# Patient Record
Sex: Male | Born: 1970 | Race: Black or African American | Hispanic: No | Marital: Single | Smoking: Former smoker
Health system: Southern US, Community
[De-identification: ages and names within clinical notes are randomized; demographics above are authoritative.]

## PROBLEM LIST (undated history)

## (undated) DIAGNOSIS — F1011 Alcohol abuse, in remission: Secondary | ICD-10-CM

## (undated) DIAGNOSIS — Z95810 Presence of automatic (implantable) cardiac defibrillator: Secondary | ICD-10-CM

## (undated) DIAGNOSIS — F419 Anxiety disorder, unspecified: Secondary | ICD-10-CM

## (undated) DIAGNOSIS — I513 Intracardiac thrombosis, not elsewhere classified: Secondary | ICD-10-CM

## (undated) DIAGNOSIS — I509 Heart failure, unspecified: Secondary | ICD-10-CM

## (undated) DIAGNOSIS — I10 Essential (primary) hypertension: Secondary | ICD-10-CM

## (undated) DIAGNOSIS — I428 Other cardiomyopathies: Secondary | ICD-10-CM

## (undated) HISTORY — DX: Anxiety disorder, unspecified: F41.9

## (undated) HISTORY — DX: Other cardiomyopathies: I42.8

## (undated) HISTORY — DX: Intracardiac thrombosis, not elsewhere classified: I51.3

## (undated) HISTORY — PX: HERNIA REPAIR: SHX51

---

## 1996-04-01 HISTORY — PX: KNEE SURGERY: SHX244

## 2012-06-30 ENCOUNTER — Emergency Department (INDEPENDENT_AMBULATORY_CARE_PROVIDER_SITE_OTHER)
Admission: EM | Admit: 2012-06-30 | Discharge: 2012-06-30 | Disposition: A | Discharge status: Home / Self Care | Payer: Self-pay | Source: Home / Self Care

## 2012-06-30 ENCOUNTER — Encounter (HOSPITAL_COMMUNITY): Payer: Self-pay

## 2012-06-30 DIAGNOSIS — B86 Scabies: Secondary | ICD-10-CM

## 2012-06-30 MED ORDER — PERMETHRIN 5 % EX CREA: TOPICAL_CREAM | CUTANEOUS | Status: DC

## 2012-06-30 NOTE — ED Notes (Signed)
Reported generalized itching for 2 weeks, no one else in home affected; minimal relief w OTC treatmnet

## 2012-06-30 NOTE — ED Provider Notes (Signed)
History     [name]: [id number]  Arrival date & time 06/30/12  1443   None     No chief complaint on file.   (Consider location/radiation/quality/duration/timing/severity/associated sxs/prior treatment) HPI Comments: Itching that started on his hands, digits and wrists 2 weeks ago. Now spreading to torso.    No past medical history on file.  No past surgical history on file.  No family history on file.  History  Substance Use Topics  . Smoking status: Not on file  . Smokeless tobacco: Not on file  . Alcohol Use: Not on file      Review of Systems  Constitutional: Negative.   Respiratory: Negative.   Gastrointestinal: Negative.   Genitourinary: Negative.   Skin: Negative for color change and pallor.       Positive for punctate lesions of the hands come interdigital web spaces forearms and trunk. Scratch marks and burrowing are present.    Allergies  Review of patient's allergies indicates not on file.  Home Medications   Current Outpatient Rx  Name  Route  Sig  Dispense  Refill  . permethrin (ELIMITE) 5 % cream      Apply chin to toes, rinse off in 8 to 12 hours   60 g   1     There were no vitals taken for this visit.  Physical Exam  Nursing note and vitals reviewed. Constitutional: He is oriented to person, place, and time. He appears well-developed and well-nourished. No distress.  Eyes: Conjunctivae and EOM are normal.  Neck: Normal range of motion. Neck supple.  Pulmonary/Chest: Effort normal.  Neurological: He is alert and oriented to person, place, and time.  Skin: Skin is warm and dry. Rash noted.  Psychiatric: He has a normal mood and affect.    ED Course  Procedures (including critical care time)  Labs Reviewed - No data to display No results found.   1. Scabies infestation       MDM  Apply to body as directed Clean linen and other clothes, etc as discussed.  Hayden Rasmussen, NP 06/30/12 1618  Hayden Rasmussen, NP 06/30/12 2100

## 2012-07-02 NOTE — ED Provider Notes (Signed)
Medical screening examination/treatment/procedure(s) were performed by resident physician or non-physician practitioner and as supervising physician I was immediately available for consultation/collaboration.   Yahya Boldman DOUGLAS MD.   Sky Borboa D Laurine Kuyper, MD 07/02/12 1944 

## 2012-07-14 ENCOUNTER — Encounter (HOSPITAL_COMMUNITY): Payer: Self-pay | Admitting: *Deleted

## 2012-07-14 ENCOUNTER — Emergency Department (INDEPENDENT_AMBULATORY_CARE_PROVIDER_SITE_OTHER)
Admission: EM | Admit: 2012-07-14 | Discharge: 2012-07-14 | Disposition: A | Discharge status: Home / Self Care | Payer: Self-pay | Source: Home / Self Care | Attending: Family Medicine | Admitting: Family Medicine

## 2012-07-14 DIAGNOSIS — I10 Essential (primary) hypertension: Secondary | ICD-10-CM

## 2012-07-14 MED ORDER — HYDROCHLOROTHIAZIDE 25 MG PO TABS: 25.0000 mg | ORAL_TABLET | Freq: Every day | ORAL | Status: DC

## 2012-07-14 MED ORDER — LISINOPRIL 20 MG PO TABS: 20.0000 mg | ORAL_TABLET | Freq: Every day | ORAL | Status: DC

## 2012-07-14 NOTE — ED Notes (Signed)
Headache left parietal area - feels pain behind eye - intermittent H/A x 4 days - Also C/O dizzyness when turns quickly or stands up too fast.

## 2012-07-14 NOTE — ED Provider Notes (Signed)
History     [name]: [id number]  Arrival date & time 07/14/12  1517   First MD Initiated Contact with Patient 07/14/12 1533      Chief Complaint  Patient presents with  . Headache    (Consider location/radiation/quality/duration/timing/severity/associated sxs/prior treatment) Patient is a 42 y.o. male presenting with headaches. The history is provided by the patient.  Headache Pain location:  Generalized Quality:  Dull Onset quality:  Gradual Timing:  Intermittent Progression:  Waxing and waning Chronicity:  New Context comment:  Has not taken bp meds for 6 mos., ran out. Associated symptoms: no fever, no loss of balance, no numbness, no paresthesias and no photophobia     Past Medical History  Diagnosis Date  . Hypertension     Past Surgical History  Procedure Laterality Date  . Hernia repair    . Knee surgery Right 1998    Family History  Problem Relation Age of Onset  . Hypertension Mother   . Hypertension Father   . Hypertension Sister     History  Substance Use Topics  . Smoking status: Former Games developer  . Smokeless tobacco: Former Neurosurgeon  . Alcohol Use: No      Review of Systems  Constitutional: Negative.  Negative for fever.  Eyes: Negative for photophobia.  Cardiovascular: Negative.  Negative for chest pain and leg swelling.  Gastrointestinal: Negative.   Neurological: Positive for headaches. Negative for numbness, paresthesias and loss of balance.    Allergies  Review of patient's allergies indicates not on file.  Home Medications   Current Outpatient Rx  Name  Route  Sig  Dispense  Refill  . hydrochlorothiazide (HYDRODIURIL) 25 MG tablet   Oral   Take 25 mg by mouth daily.         . hydrochlorothiazide (HYDRODIURIL) 25 MG tablet   Oral   Take 1 tablet (25 mg total) by mouth daily.   30 tablet   1   . lisinopril (PRINIVIL,ZESTRIL) 10 MG tablet   Oral   Take 10 mg by mouth daily.         Marland Kitchen lisinopril (PRINIVIL,ZESTRIL) 20 MG  tablet   Oral   Take 1 tablet (20 mg total) by mouth daily.   30 tablet   1   . permethrin (ELIMITE) 5 % cream      Apply chin to toes, rinse off in 8 to 12 hours   60 g   1   . permethrin (ELIMITE) 5 % cream      Apply from chin to toes, then rinse in 8-10 hours   60 g   1     BP 192/135  Pulse 86  Temp(Src) 98.2 F (36.8 C) (Oral)  SpO2 95%  Physical Exam  Nursing note and vitals reviewed. Constitutional: He is oriented to person, place, and time. He appears well-developed and well-nourished.  HENT:  Head: Normocephalic.  Right Ear: External ear normal.  Left Ear: External ear normal.  Eyes: Conjunctivae are normal. Pupils are equal, round, and reactive to light.  Neck: Normal range of motion and phonation normal. Neck supple. Normal carotid pulses present. Carotid bruit is not present.  Cardiovascular: Normal rate, regular rhythm, normal heart sounds and intact distal pulses.   Pulmonary/Chest: Effort normal and breath sounds normal.  Musculoskeletal: He exhibits no edema.  Lymphadenopathy:    He has no cervical adenopathy.  Neurological: He is alert and oriented to person, place, and time.  Skin: Skin is warm and dry.  Psychiatric:  He has a normal mood and affect. His behavior is normal. Judgment and thought content normal.    ED Course  Procedures (including critical care time)  Labs Reviewed - No data to display No results found.   1. Hypertension, essential       MDM         Linna Hoff, MD 07/14/12 1620

## 2012-07-20 ENCOUNTER — Encounter (HOSPITAL_COMMUNITY): Payer: Self-pay | Admitting: *Deleted

## 2012-07-20 ENCOUNTER — Emergency Department (INDEPENDENT_AMBULATORY_CARE_PROVIDER_SITE_OTHER)
Admission: EM | Admit: 2012-07-20 | Discharge: 2012-07-20 | Disposition: A | Discharge status: Home / Self Care | Payer: Self-pay | Source: Home / Self Care

## 2012-07-20 DIAGNOSIS — I10 Essential (primary) hypertension: Secondary | ICD-10-CM

## 2012-07-20 NOTE — ED Provider Notes (Signed)
History     [name]: [id number]  Arrival date & time 07/20/12  1233   First MD Initiated Contact with Patient 07/20/12 1319      Chief Complaint  Patient presents with  . Follow-up     HPI 42 year old male with uncontrolled hypertension for almost 10 years who had not followed up for several months and not been on any medications he was seen one week back in the clinic and noted to have uncontrolled blood pressure. He was restarted on HCTZ-lisinopril (25-20 mg) once a day and ask for a followup visit in one week. Patient has symptom of headache for several week during his visit last week. Today he informs that he has started taking his blood pressure medication and has been quite compliant with it. His headache has resolved. Denies any blurry  vision, dizziness, chest pain, palpitations, shortness of breath, abdominal pain, nausea, vomiting, bowel or urinary symptoms. Denies any tingling or numbness of extremities. He informs that he has started to avoid junk foods and avoided drinking sodas. He is also started cutting down on fatty foods and trying to loose some weight.  Past Medical History  Diagnosis Date  . Hypertension     Past Surgical History  Procedure Laterality Date  . Hernia repair    . Knee surgery Right 1998    Family History  Problem Relation Age of Onset  . Hypertension Mother   . Hypertension Father   . Hypertension Sister     History  Substance Use Topics  . Smoking status: Former Games developer  . Smokeless tobacco: Former Neurosurgeon  . Alcohol Use: No      Review of Systems As outlined in history of present illness Allergies  Review of patient's allergies indicates no known allergies.  Home Medications   Current Outpatient Rx  Name  Route  Sig  Dispense  Refill  . hydrochlorothiazide (HYDRODIURIL) 25 MG tablet   Oral   Take 25 mg by mouth daily.         . hydrochlorothiazide (HYDRODIURIL) 25 MG tablet   Oral   Take 1 tablet (25 mg total) by mouth  daily.   30 tablet   1   . lisinopril (PRINIVIL,ZESTRIL) 10 MG tablet   Oral   Take 10 mg by mouth daily.         Marland Kitchen lisinopril (PRINIVIL,ZESTRIL) 20 MG tablet   Oral   Take 1 tablet (20 mg total) by mouth daily.   30 tablet   1   . permethrin (ELIMITE) 5 % cream      Apply chin to toes, rinse off in 8 to 12 hours   60 g   1   . permethrin (ELIMITE) 5 % cream      Apply from chin to toes, then rinse in 8-10 hours   60 g   1     BP 134/103  Pulse 85  Temp(Src) 98.2 F (36.8 C) (Oral)  Resp 16  SpO2 99%  Physical Exam Middle aged male in no acute distress HEENT: No pallor, muscle to mucosa Chest: Clear to auscultation bilaterally, no added sounds CVS: Normal S1 and S2, no murmurs Abdomen: Soft, nontender, nondistended, bowel sounds present Extremities: Warm, no edema CNS: AAOX3  ED Course  Procedures (including critical care time)    No diagnosis found.  Assessment/ plan  Uncontrolled hypertension Blood pressure has improved since his last visit however still has elevated diastolic blood pressure. I would continue him on his current blood  pressure medication as it was  recently started. I have encouraged him to continue taking minimal salt in diet, avoiding caffineated beverages and diet with high salt content. I will order for labs to check for CBC, BMET, lipid panel, A1C and UA.  Followup in the clinic in one week to have his labs and blood pressure checked. Advised to have his blood pressure checked every 2-3 days until his next visit and keep a log.       MDM  Follow up in one week to have his labs reviewed and blood pressure checked to adjust his medications        Keilah Lemire, MD 07/20/12 1414

## 2012-07-20 NOTE — ED Notes (Signed)
Patient presents for follow up of hypertension. Patient states he feels better and headaches have gone away.

## 2012-07-21 ENCOUNTER — Emergency Department (INDEPENDENT_AMBULATORY_CARE_PROVIDER_SITE_OTHER)
Admission: EM | Admit: 2012-07-21 | Discharge: 2012-07-21 | Disposition: A | Discharge status: Home / Self Care | Payer: Self-pay | Source: Home / Self Care

## 2012-07-21 DIAGNOSIS — I10 Essential (primary) hypertension: Secondary | ICD-10-CM

## 2012-07-21 LAB — CBC
HCT: 41.9 % (ref 39.0–52.0)
Hemoglobin: 15.2 g/dL (ref 13.0–17.0)
MCH: 29.7 pg (ref 26.0–34.0)
MCV: 81.8 fL (ref 78.0–100.0)
RBC: 5.12 MIL/uL (ref 4.22–5.81)

## 2012-07-21 LAB — LIPID PANEL
LDL Cholesterol: 172 mg/dL — ABNORMAL HIGH (ref 0–99)
Triglycerides: 163 mg/dL — ABNORMAL HIGH (ref <150)
VLDL: 33 mg/dL (ref 0–40)

## 2012-07-21 LAB — BASIC METABOLIC PANEL
BUN: 10 mg/dL (ref 6–23)
CO2: 31 mEq/L (ref 19–32)
Calcium: 10 mg/dL (ref 8.4–10.5)
Creatinine, Ser: 0.95 mg/dL (ref 0.50–1.35)
Glucose, Bld: 88 mg/dL (ref 70–99)
Sodium: 140 mEq/L (ref 135–145)

## 2012-07-21 LAB — URINALYSIS, ROUTINE W REFLEX MICROSCOPIC
Glucose, UA: NEGATIVE mg/dL
Ketones, ur: NEGATIVE mg/dL
Leukocytes, UA: NEGATIVE
Protein, ur: NEGATIVE mg/dL

## 2012-07-21 NOTE — ED Notes (Signed)
Patient here for lab work Cbc bmet Lipid a1c ua

## 2012-07-21 NOTE — ED Notes (Signed)
Here for lab work Cbc bmet Lipid a1c ua

## 2012-07-28 ENCOUNTER — Encounter (HOSPITAL_COMMUNITY): Payer: Self-pay

## 2012-07-28 ENCOUNTER — Emergency Department (HOSPITAL_COMMUNITY)
Admission: EM | Admit: 2012-07-28 | Discharge: 2012-07-28 | Disposition: A | Discharge status: Home / Self Care | Payer: Self-pay | Source: Home / Self Care

## 2012-07-28 DIAGNOSIS — I10 Essential (primary) hypertension: Secondary | ICD-10-CM

## 2012-07-28 DIAGNOSIS — E785 Hyperlipidemia, unspecified: Secondary | ICD-10-CM

## 2012-07-28 MED ORDER — ATORVASTATIN CALCIUM 20 MG PO TABS: 20.0000 mg | ORAL_TABLET | Freq: Every day | ORAL | Status: DC

## 2012-07-28 MED ORDER — ASPIRIN 81 MG PO TBEC: 81.0000 mg | DELAYED_RELEASE_TABLET | Freq: Every day | ORAL | Status: DC

## 2012-07-28 NOTE — ED Notes (Signed)
Patient here for follow up History of HTN Needs lab results

## 2012-07-28 NOTE — ED Provider Notes (Signed)
History     [name]: [id number]  Arrival date & time 07/28/12  1057   None     Chief Complaint  Patient presents with  . Hypertension     42 year old male with a history of hypertension comes for a followup appointment for blood pressure check. The patient has been taking HCTZ/lisinopril. His blood pressure is 148 systolic today. The patient had blood work done during his last visit and his elevated LDL and low HDL. I have recommended him to be started on Lipitor., Also advised him to monitor his blood pressure on a regular basis.Denies any blurry vision, dizziness, chest pain, palpitations, shortness of breath, abdominal pain, nausea, vomiting, bowel or urinary symptoms. Denies any tingling or numbness of extremities.  HPI  Past Medical History  Diagnosis Date  . Hypertension     Past Surgical History  Procedure Laterality Date  . Hernia repair    . Knee surgery Right 1998    Family History  Problem Relation Age of Onset  . Hypertension Mother   . Hypertension Father   . Hypertension Sister     History  Substance Use Topics  . Smoking status: Former Games developer  . Smokeless tobacco: Former Neurosurgeon  . Alcohol Use: No      Review of Systems  Allergies  Review of patient's allergies indicates no known allergies.  Home Medications   Current Outpatient Rx  Name  Route  Sig  Dispense  Refill  . aspirin 81 MG EC tablet   Oral   Take 1 tablet (81 mg total) by mouth daily. Swallow whole.   30 tablet   12   . atorvastatin (LIPITOR) 20 MG tablet   Oral   Take 1 tablet (20 mg total) by mouth daily.   30 tablet   2   . hydrochlorothiazide (HYDRODIURIL) 25 MG tablet   Oral   Take 1 tablet (25 mg total) by mouth daily.   30 tablet   1   . lisinopril (PRINIVIL,ZESTRIL) 20 MG tablet   Oral   Take 1 tablet (20 mg total) by mouth daily.   30 tablet   1   . permethrin (ELIMITE) 5 % cream      Apply chin to toes, rinse off in 8 to 12 hours   60 g   1   . permethrin  (ELIMITE) 5 % cream      Apply from chin to toes, then rinse in 8-10 hours   60 g   1     BP 148/94  Pulse 87  Temp(Src) 97.7 F (36.5 C) (Oral)  SpO2 100%  Physical Exam Middle aged male in no acute distress  HEENT: No pallor, muscle to mucosa  Chest: Clear to auscultation bilaterally, no added sounds  CVS: Normal S1 and S2, no murmurs  Abdomen: Soft, nontender, nondistended, bowel sounds present  Extremities: Warm, no edema  CNS: AAOX3   ED Course  Procedures (including critical care time)  Labs Reviewed - No data to display No results found.   1. Hypertension continue antihypertensive medication and followup in a month for a recheck   2. Dyslipidemia (high LDL; low HDL) started on Lipitor. Also started the patient on a baby aspirin     Followup in one month  MDM           Richarda Overlie, MD 07/28/12 1128

## 2012-08-25 ENCOUNTER — Telehealth: Payer: Self-pay | Admitting: General Practice

## 2012-08-25 NOTE — Telephone Encounter (Signed)
08/25/12 Patient unavailable at this time message  Left with motherHattie Perch) to have patient  Call and schedule appointment  To be seen this week for f/u hypertension.  P.Mekia Dipinto,RN

## 2012-08-25 NOTE — Telephone Encounter (Signed)
Pt saw Dr[facility] Artis Flock [facility] on 07/14/12 and was prescribed 1)Hydrocholorthiazide and 2)Lisinopril.  Even though chart says he has a remaining refill his pharmacy told him he does not.  Pt would like script to be resent for final refill, he is an established pt with [facility].

## 2012-08-25 NOTE — Telephone Encounter (Signed)
Pt was directed at his visit 4/15 to f/u in 1 month regarding his HTN. Please schedule him a visit as soon as possible so we can se him and address his concerns. Thanks AW

## 2012-09-02 ENCOUNTER — Ambulatory Visit: Payer: Self-pay | Attending: Family Medicine | Admitting: Internal Medicine

## 2012-09-02 ENCOUNTER — Encounter: Payer: Self-pay | Admitting: Internal Medicine

## 2012-09-02 VITALS — BP 125/84 | HR 87 | Temp 98.3°F | Resp 16 | Wt 208.6 lb

## 2012-09-02 DIAGNOSIS — I10 Essential (primary) hypertension: Secondary | ICD-10-CM | POA: Insufficient documentation

## 2012-09-02 MED ORDER — LISINOPRIL-HYDROCHLOROTHIAZIDE 20-25 MG PO TABS: 1.0000 | ORAL_TABLET | Freq: Every day | ORAL | Status: DC

## 2012-09-02 NOTE — Patient Instructions (Signed)

## 2012-09-02 NOTE — Progress Notes (Signed)
Patient ID: Robert Booth, male   DOB: 1971-03-26, 42 y.o.   MRN: [id number]  CC: refill of BP medication   HPI: Pt is 42 yo male who comes in needing refill on BP medication. He denies chest pain or shortness of breath, no specific concerns. He reports compliance with medications.  No Known Allergies Past Medical History  Diagnosis Date  . Hypertension    Current Outpatient Prescriptions on File Prior to Visit  Medication Sig Dispense Refill  . aspirin 81 MG EC tablet Take 1 tablet (81 mg total) by mouth daily. Swallow whole.  30 tablet  12  . atorvastatin (LIPITOR) 20 MG tablet Take 1 tablet (20 mg total) by mouth daily.  30 tablet  2  . hydrochlorothiazide (HYDRODIURIL) 25 MG tablet Take 1 tablet (25 mg total) by mouth daily.  30 tablet  1  . lisinopril (PRINIVIL,ZESTRIL) 20 MG tablet Take 1 tablet (20 mg total) by mouth daily.  30 tablet  1  . permethrin (ELIMITE) 5 % cream Apply chin to toes, rinse off in 8 to 12 hours  60 g  1  . permethrin (ELIMITE) 5 % cream Apply from chin to toes, then rinse in 8-10 hours  60 g  1   No current facility-administered medications on file prior to visit.   Family History  Problem Relation Age of Onset  . Hypertension Mother   . Hypertension Father   . Hypertension Sister    History   Social History  . Marital Status: Single    Spouse Name: N/A    Number of Children: N/A  . Years of Education: N/A   Occupational History  . Not on file.   Social History Main Topics  . Smoking status: Former Games developer  . Smokeless tobacco: Former Neurosurgeon  . Alcohol Use: No  . Drug Use: Yes  . Sexually Active: Yes   Other Topics Concern  . Not on file   Social History Narrative  . No narrative on file    Review of Systems  Constitutional: Negative for fever, chills, diaphoresis, activity change, appetite change and fatigue.  HENT: Negative for ear pain, nosebleeds, congestion, facial swelling, rhinorrhea, neck pain, neck stiffness and ear discharge.    Eyes: Negative for pain, discharge, redness, itching and visual disturbance.  Respiratory: Negative for cough, choking, chest tightness, shortness of breath, wheezing and stridor.   Cardiovascular: Negative for chest pain, palpitations and leg swelling.  Gastrointestinal: Negative for abdominal distention.  Genitourinary: Negative for dysuria, urgency, frequency, hematuria, flank pain, decreased urine volume, difficulty urinating and dyspareunia.  Musculoskeletal: Negative for back pain, joint swelling, arthralgias and gait problem.  Neurological: Negative for dizziness, tremors, seizures, syncope, facial asymmetry, speech difficulty, weakness, light-headedness, numbness and headaches.  Hematological: Negative for adenopathy. Does not bruise/bleed easily.  Psychiatric/Behavioral: Negative for hallucinations, behavioral problems, confusion, dysphoric mood, decreased concentration and agitation.    Objective:   Filed Vitals:   09/02/12 0936  BP: 125/84  Pulse: 87  Temp: 98.3 F (36.8 C)  Resp: 16    Physical Exam  Constitutional: Appears well-developed and well-nourished. No distress.  HENT: Normocephalic. External right and left ear normal. Oropharynx is clear and moist.  Eyes: Conjunctivae and EOM are normal. PERRLA, no scleral icterus.  Neck: Normal ROM. Neck supple. No JVD. No tracheal deviation. No thyromegaly.  CVS: RRR, S1/S2 +, no murmurs, no gallops, no carotid bruit.  Pulmonary: Effort and breath sounds normal, no stridor, rhonchi, wheezes, rales.  Abdominal: Soft. BS +,  no distension, tenderness, rebound or guarding.  Musculoskeletal: Normal range of motion. No edema and no tenderness.  Lymphadenopathy: No lymphadenopathy noted, cervical, inguinal. Neuro: Alert. Normal reflexes, muscle tone coordination. No cranial nerve deficit. Skin: Skin is warm and dry. No rash noted. Not diaphoretic. No erythema. No pallor.  Psychiatric: Normal mood and affect. Behavior, judgment,  thought content normal.   Lab Results  Component Value Date   WBC 6.7 07/21/2012   HGB 15.2 07/21/2012   HCT 41.9 07/21/2012   MCV 81.8 07/21/2012   PLT 284 07/21/2012   Lab Results  Component Value Date   CREATININE 0.95 07/21/2012   BUN 10 07/21/2012   NA 140 07/21/2012   K 3.7 07/21/2012   CL 98 07/21/2012   CO2 31 07/21/2012    Lab Results  Component Value Date   HGBA1C 4.9 07/21/2012   Lipid Panel     Component Value Date/Time   CHOL 262* 07/21/2012 1026   TRIG 163* 07/21/2012 1026   HDL 57 07/21/2012 1026   CHOLHDL 4.6 07/21/2012 1026   VLDL 33 07/21/2012 1026   LDLCALC 172* 07/21/2012 1026       Assessment and plan:   HTN - well controlled, we have discussed importance of checking BP regularly and to call us back if BP  Is > 140/90 - recommended check up in 2-3 months, BMP and BP check

## 2012-09-02 NOTE — Progress Notes (Signed)
Patient here for medication refill Takes medication for HTN

## 2014-05-31 HISTORY — PX: CARDIAC CATHETERIZATION: SHX172

## 2014-07-01 DIAGNOSIS — I513 Intracardiac thrombosis, not elsewhere classified: Secondary | ICD-10-CM

## 2014-07-01 HISTORY — DX: Intracardiac thrombosis, not elsewhere classified: I51.3

## 2014-08-11 ENCOUNTER — Telehealth: Payer: Self-pay | Admitting: Cardiology

## 2014-08-11 ENCOUNTER — Encounter (HOSPITAL_COMMUNITY): Payer: Self-pay | Admitting: Emergency Medicine

## 2014-08-11 ENCOUNTER — Emergency Department (INDEPENDENT_AMBULATORY_CARE_PROVIDER_SITE_OTHER)
Admission: EM | Admit: 2014-08-11 | Discharge: 2014-08-11 | Disposition: A | Discharge status: Home / Self Care | Payer: Self-pay | Source: Home / Self Care | Attending: Family Medicine | Admitting: Family Medicine

## 2014-08-11 DIAGNOSIS — I213 ST elevation (STEMI) myocardial infarction of unspecified site: Secondary | ICD-10-CM

## 2014-08-11 DIAGNOSIS — I509 Heart failure, unspecified: Secondary | ICD-10-CM

## 2014-08-11 DIAGNOSIS — I513 Intracardiac thrombosis, not elsewhere classified: Secondary | ICD-10-CM

## 2014-08-11 DIAGNOSIS — I158 Other secondary hypertension: Secondary | ICD-10-CM

## 2014-08-11 DIAGNOSIS — F1020 Alcohol dependence, uncomplicated: Secondary | ICD-10-CM

## 2014-08-11 LAB — PROTIME-INR
INR: 3.11 — ABNORMAL HIGH (ref 0.00–1.49)
Prothrombin Time: 32.2 seconds — ABNORMAL HIGH (ref 11.6–15.2)

## 2014-08-11 NOTE — Progress Notes (Signed)
08/11/2014: INR result = 3.1. Patient contacted by phone and notified of results. Advised to continue current dosing schedule and to follow up with Dr. Antoine Poche at upcoming appointment on 08/18/2014 @ 8:15am

## 2014-08-11 NOTE — Telephone Encounter (Signed)
Received records from [facility] for appointment with Dr Antoine Poche on 08/18/14.  Records given to [facility] (medical records) for Dr Hochrein's schedule on 08/18/14. lp

## 2014-08-11 NOTE — ED Notes (Signed)
Medication refill and blood draw

## 2014-08-11 NOTE — ED Provider Notes (Signed)
[name]: [id number]     Arrival date & time 08/11/14  1100 History   First MD Initiated Contact with Patient 08/11/14 1257     Chief Complaint  Patient presents with  . Medication Refill   (Consider location/radiation/quality/duration/timing/severity/associated sxs/prior Treatment) HPI Comments: Patient presents requesting INR check and medication refills. States he moved back to [place], [place] from [place], [place] on Aug 06, 2014. Was under the care of [facility] for management of CHF (EF 20%), LV Thrombus, HTN, alcoholic cardiomyopathy. Was last seen in [place] on Aug 02, 2014 and INR at that time was 2.8 (goal 2-3). States he recently completed inpatient program for alcoholism and states he has been sober since May 25, 2014. Came to [place] after leaving program to live with family. No arrangements made for PCP or cardiology follow up before relocating. Reports himself to currently be asymptomatic.  The history is provided by the patient.    Past Medical History  Diagnosis Date  . Hypertension    Past Surgical History  Procedure Laterality Date  . Hernia repair    . Knee surgery Right 1998   Family History  Problem Relation Age of Onset  . Hypertension Mother   . Hypertension Father   . Hypertension Sister    History  Substance Use Topics  . Smoking status: Former Research scientist (life sciences)  . Smokeless tobacco: Former Systems developer  . Alcohol Use: No    Review of Systems  All other systems reviewed and are negative.   Allergies  Review of patient's allergies indicates no known allergies.  Home Medications   Prior to Admission medications   Medication Sig Start Date End Date Taking? Authorizing Provider  furosemide (LASIX) 40 MG tablet Take 40 mg by mouth.   Yes Historical Provider, MD  losartan (COZAAR) 50 MG tablet Take 50 mg by mouth daily.   Yes Historical Provider, MD  metoprolol tartrate (LOPRESSOR) 25 MG tablet Take 25 mg by mouth 2 (two) times daily.   Yes Historical Provider, MD   warfarin (COUMADIN) 10 MG tablet Take 10 mg by mouth daily.   Yes Historical Provider, MD  aspirin 81 MG EC tablet Take 1 tablet (81 mg total) by mouth daily. Swallow whole. 07/28/12   Reyne Dumas, MD  atorvastatin (LIPITOR) 20 MG tablet Take 1 tablet (20 mg total) by mouth daily. Patient not taking: Reported on 08/11/2014 07/28/12   Reyne Dumas, MD  lisinopril-hydrochlorothiazide (PRINZIDE,ZESTORETIC) 20-25 MG per tablet Take 1 tablet by mouth daily. 09/02/12   Theodis Blaze, MD  permethrin (ELIMITE) 5 % cream Apply chin to toes, rinse off in 8 to 12 hours Patient not taking: Reported on 08/11/2014 06/30/12   Janne Napoleon, NP  permethrin (ELIMITE) 5 % cream Apply from chin to toes, then rinse in 8-10 hours Patient not taking: Reported on 08/11/2014 06/30/12   Janne Napoleon, NP   BP 129/94 mmHg  Pulse 72  Temp(Src) 98.3 F (36.8 C) (Oral)  Resp 18  SpO2 100% Physical Exam  Constitutional: He is oriented to person, place, and time. He appears well-developed and well-nourished. No distress.  HENT:  Head: Normocephalic and atraumatic.  Eyes: Conjunctivae are normal. No scleral icterus.  Cardiovascular: Normal rate, regular rhythm and normal heart sounds.   Pulmonary/Chest: Effort normal and breath sounds normal. No respiratory distress. He has no wheezes. He has no rales.  Musculoskeletal: Normal range of motion.  Neurological: He is alert and oriented to person, place, and time.  Skin: Skin is warm and dry.  Psychiatric: He has a normal mood and affect. His behavior is normal.  Nursing note and vitals reviewed.   ED Course  Procedures (including critical care time) Labs Review Labs Reviewed  PROTIME-INR    Imaging Review No results found.   MDM   1. Heart failure, unspecified heart failure chronicity, unspecified heart failure type   2. Other secondary hypertension   3. Left ventricular thrombus   4. Alcoholism    I have contacted a local cardiologist (Dr. Jeneen Rinks Hochrein's office)  who will see patient on Aug 18, 2014 at 8:00am. After reviewing paperwork, it would appear that patient has active refills on at [facility] for current medications. Patient advised to choose a local pharmacy and have them contact  previous pharmacy to transfer active prescriptions. Advised to please take care of this today. INR drawn at [facility] and patient will be contacted by phone at [phone] with the results of INR if  coumadin dose requires adjustment. Patient also met with patient access coordinator while in clinic to discuss financial assistance program.     Lutricia Feil, [place] 08/11/14 1431

## 2014-08-11 NOTE — Discharge Instructions (Signed)
I have contacted a local cardiologist who will see you on Aug 18, 2014 at 8:00am. After reviewing your paperwork, it would appear that you have active refills on your current medications. Please choose a local pharmacy and have them contact your previous pharmacy to transfer your active prescriptions. Please take care of this today. You will be contacted by phone at [phone] with the results of your INR if your coumadin requires adjustment.

## 2014-08-18 ENCOUNTER — Ambulatory Visit (INDEPENDENT_AMBULATORY_CARE_PROVIDER_SITE_OTHER): Payer: Self-pay | Admitting: Cardiology

## 2014-08-18 ENCOUNTER — Other Ambulatory Visit: Payer: Self-pay | Admitting: *Deleted

## 2014-08-18 ENCOUNTER — Encounter: Payer: Self-pay | Admitting: Cardiology

## 2014-08-18 VITALS — BP 140/80 | HR 94 | Ht 69.0 in | Wt 195.0 lb

## 2014-08-18 DIAGNOSIS — I429 Cardiomyopathy, unspecified: Secondary | ICD-10-CM

## 2014-08-18 DIAGNOSIS — R002 Palpitations: Secondary | ICD-10-CM

## 2014-08-18 DIAGNOSIS — I428 Other cardiomyopathies: Secondary | ICD-10-CM | POA: Insufficient documentation

## 2014-08-18 DIAGNOSIS — I10 Essential (primary) hypertension: Secondary | ICD-10-CM

## 2014-08-18 MED ORDER — METOPROLOL SUCCINATE ER 50 MG PO TB24: 50.0000 mg | ORAL_TABLET | Freq: Every day | ORAL | Status: DC

## 2014-08-18 NOTE — Progress Notes (Addendum)
Cardiology Office Note   Date:  08/18/2014   ID:  Robert Booth, DOB 06-Mar-1971, MRN [id number]  PCP:  Standley Dakins, MD  Cardiologist:   Rollene Rotunda, MD   Chief Complaint  Patient presents with  . Cardiomyopathy      History of Present Illness: Robert Booth is a 44 y.o. male who presents for evaluation of cardiomyopathy. 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 This was thought to be probably alcohol related. Although he does have difficult to control hypertension. He was treated with beta blockers. He did undergo cardiac catheterization. I don't have the cath results but he says it was normal. He came back for another visit last month and was found to have LV thrombus. His ejection fraction was about 20%. I was able to review outside records. He was treated with warfarin. He is now back living here. He has been alcohol free since February. He denies any cardiovascular symptoms. He walks for exercise. The patient denies any new symptoms such as chest discomfort, neck or arm discomfort. There has been no new shortness of breath, PND or orthopnea. There have been no reported palpitations, presyncope or syncope.   Past Medical History  Diagnosis Date  . Hypertension   . Alcoholism     Sober since Feb  . Anxiety     Past Surgical History  Procedure Laterality Date  . Hernia repair Right Inguinal  . Knee surgery Right 1998     Current Outpatient Prescriptions  Medication Sig Dispense Refill  . aspirin 81 MG EC tablet Take 1 tablet (81 mg total) by mouth daily. Swallow whole. 30 tablet 12  . furosemide (LASIX) 40 MG tablet Take 40 mg by mouth.    . losartan (COZAAR) 50 MG tablet Take 50 mg by mouth daily.    . metoprolol succinate (TOPROL-XL) 25 MG 24 hr tablet Take 25 mg by mouth daily.    Marland Kitchen warfarin (COUMADIN) 10 MG tablet Take 10 mg by mouth daily.     No current facility-administered medications for  this visit.    Allergies:   Review of patient's allergies indicates no known allergies.    Social History:  The patient  reports that he has quit smoking. His smoking use included Cigarettes. He has quit using smokeless tobacco. His smokeless tobacco use included Chew. He reports that he uses illicit drugs. He reports that he does not drink alcohol.   Family History:  The patient's family history includes Heart attack (age of onset: 18) in his paternal grandfather; Heart attack (age of onset: 83) in his father; Hypertension in his father, mother, and sister.    ROS:  Please see the history of present illness.   Otherwise, review of systems are positive for none.   All other systems are reviewed and negative.    PHYSICAL EXAM: VS:  BP 140/80 mmHg  Pulse 94  Ht  (1.753 m)  Wt 195 lb (88.451 kg)  BMI 28.78 kg/m2 , BMI Body mass index is 28.78 kg/(m^2). GENERAL:  Well appearing HEENT:  Pupils equal round and reactive, fundi not visualized, oral mucosa unremarkable NECK:  No jugular venous distention, waveform within normal limits, carotid upstroke brisk and symmetric, no bruits, no thyromegaly LYMPHATICS:  No cervical, inguinal adenopathy LUNGS:  Clear to auscultation bilaterally BACK:  No CVA tenderness CHEST:  Unremarkable HEART:  PMI not displaced or sustained,S1 and S2 within normal limits, positive S3,  no S4, no clicks, no rubs, no murmurs ABD:  Flat, positive bowel sounds normal in frequency in pitch, no bruits, no rebound, no guarding, no midline pulsatile mass, no hepatomegaly, no splenomegaly EXT:  2 plus pulses throughout, no edema, no cyanosis no clubbing SKIN:  No rashes no nodules NEURO:  Cranial nerves II through XII grossly intact, motor grossly intact throughout PSYCH:  Cognitively intact, oriented to person place and time    EKG:  EKG is ordered today. The ekg ordered today demonstrates sinus rhythm, rate 94, axis within normal limits, left ventricular  hypertrophy by voltage criteria no acute ST-T wave changes    Wt Readings from Last 3 Encounters:  08/18/14 195 lb (88.451 kg)  09/02/12 208 lb 9.6 oz (94.62 kg)      Other studies Reviewed: Additional studies/ records that were reviewed today include: Outside records from [facility]. Review of the above records demonstrates:  Please see elsewhere in the note.     ASSESSMENT AND PLAN:  CARDIOMYOPATHY:  Dilated probably related to alcohol. Then going to titrate his medications. We talked about salt and fluid restriction. He is alcohol free. I'm going to double his beta blocker today. I suspect we we'll be able to double this when we see him again. The ultimate goal would be at least 150 mg with Cozaar dose being 100 mg. I would then add spironolactone after this.  HTN:  This is being managed in the context of treating his CHF   Current medicines are reviewed at length with the patient today.  The patient does not have concerns regarding medicines.  The following changes have been made:  See below  Labs/ tests ordered today include:   Orders Placed This Encounter  Procedures  . EKG 12-Lead     Disposition:   FU with APP in two weeks for med titratin.     Signed, Rollene Rotunda, MD  08/18/2014 8:36 AM    [facility]

## 2014-08-18 NOTE — Patient Instructions (Signed)
Your physician recommends that you schedule a follow-up appointment in: 2 weeks with a PA  We have increased your Toprol to 50 mg daily

## 2014-09-05 ENCOUNTER — Ambulatory Visit: Payer: Self-pay | Attending: Internal Medicine

## 2014-09-15 ENCOUNTER — Encounter: Payer: Self-pay | Admitting: *Deleted

## 2014-09-19 ENCOUNTER — Ambulatory Visit: Payer: Self-pay | Admitting: Physician Assistant

## 2014-09-26 ENCOUNTER — Other Ambulatory Visit: Payer: Self-pay

## 2014-09-26 ENCOUNTER — Telehealth: Payer: Self-pay | Admitting: Cardiology

## 2014-09-26 MED ORDER — LOSARTAN POTASSIUM 50 MG PO TABS: 50.0000 mg | ORAL_TABLET | Freq: Every day | ORAL | Status: DC

## 2014-09-26 MED ORDER — METOPROLOL SUCCINATE ER 50 MG PO TB24: 50.0000 mg | ORAL_TABLET | Freq: Every day | ORAL | Status: DC

## 2014-09-26 NOTE — Telephone Encounter (Signed)
°  1. Which medications need to be refilled? Metoprolol and Losartan-need new prescriptions  2. Which pharmacy is medication to be sent to?[facility]  3. Do they need a 30 day or 90 day supply? 90 and refills  4. Would they like a call back once the medication has been sent to the pharmacy? yes

## 2014-10-10 ENCOUNTER — Telehealth: Payer: Self-pay | Admitting: Cardiology

## 2014-10-10 MED ORDER — FUROSEMIDE 40 MG PO TABS: 40.0000 mg | ORAL_TABLET | Freq: Every day | ORAL | Status: DC

## 2014-10-10 MED ORDER — WARFARIN SODIUM 10 MG PO TABS: 10.0000 mg | ORAL_TABLET | Freq: Every day | ORAL | Status: DC

## 2014-10-10 NOTE — Telephone Encounter (Signed)
°  1. Which medications need to be refilled? Furosemide and Warfarin   2. Which pharmacy is medication to be sent to? [facility]   3. Do they need a 30 day or 90 day supply? Did not specify   4. Would they like a call back once the medication has been sent to the pharmacy? Yes

## 2014-10-12 ENCOUNTER — Encounter (HOSPITAL_COMMUNITY): Payer: Self-pay | Admitting: *Deleted

## 2014-10-12 ENCOUNTER — Emergency Department (HOSPITAL_COMMUNITY): Payer: Medicaid Other

## 2014-10-12 ENCOUNTER — Inpatient Hospital Stay (HOSPITAL_COMMUNITY)
Admission: EM | Admit: 2014-10-12 | Discharge: 2014-10-19 | DRG: 193 | Disposition: A | Discharge status: Home / Self Care | Payer: Medicaid Other | Attending: Family Medicine | Admitting: Family Medicine

## 2014-10-12 ENCOUNTER — Emergency Department (INDEPENDENT_AMBULATORY_CARE_PROVIDER_SITE_OTHER)
Admission: EM | Admit: 2014-10-12 | Discharge: 2014-10-12 | Disposition: A | Discharge status: Nursing Home (Includes ICF) | Payer: Self-pay | Source: Home / Self Care | Attending: Family Medicine | Admitting: Family Medicine

## 2014-10-12 ENCOUNTER — Encounter (HOSPITAL_COMMUNITY): Payer: Self-pay | Admitting: Emergency Medicine

## 2014-10-12 DIAGNOSIS — Z9119 Patient's noncompliance with other medical treatment and regimen: Secondary | ICD-10-CM | POA: Diagnosis present

## 2014-10-12 DIAGNOSIS — J189 Pneumonia, unspecified organism: Principal | ICD-10-CM | POA: Diagnosis present

## 2014-10-12 DIAGNOSIS — F419 Anxiety disorder, unspecified: Secondary | ICD-10-CM | POA: Diagnosis present

## 2014-10-12 DIAGNOSIS — R042 Hemoptysis: Secondary | ICD-10-CM | POA: Diagnosis present

## 2014-10-12 DIAGNOSIS — Z87891 Personal history of nicotine dependence: Secondary | ICD-10-CM

## 2014-10-12 DIAGNOSIS — I10 Essential (primary) hypertension: Secondary | ICD-10-CM | POA: Insufficient documentation

## 2014-10-12 DIAGNOSIS — Z86711 Personal history of pulmonary embolism: Secondary | ICD-10-CM

## 2014-10-12 DIAGNOSIS — I5023 Acute on chronic systolic (congestive) heart failure: Secondary | ICD-10-CM | POA: Diagnosis present

## 2014-10-12 DIAGNOSIS — Z91120 Patient's intentional underdosing of medication regimen due to financial hardship: Secondary | ICD-10-CM | POA: Diagnosis present

## 2014-10-12 DIAGNOSIS — I129 Hypertensive chronic kidney disease with stage 1 through stage 4 chronic kidney disease, or unspecified chronic kidney disease: Secondary | ICD-10-CM | POA: Diagnosis present

## 2014-10-12 DIAGNOSIS — R05 Cough: Secondary | ICD-10-CM

## 2014-10-12 DIAGNOSIS — Z79899 Other long term (current) drug therapy: Secondary | ICD-10-CM

## 2014-10-12 DIAGNOSIS — T501X6A Underdosing of loop [high-ceiling] diuretics, initial encounter: Secondary | ICD-10-CM | POA: Diagnosis present

## 2014-10-12 DIAGNOSIS — I426 Alcoholic cardiomyopathy: Secondary | ICD-10-CM | POA: Diagnosis present

## 2014-10-12 DIAGNOSIS — F1020 Alcohol dependence, uncomplicated: Secondary | ICD-10-CM | POA: Diagnosis present

## 2014-10-12 DIAGNOSIS — R059 Cough, unspecified: Secondary | ICD-10-CM

## 2014-10-12 DIAGNOSIS — I420 Dilated cardiomyopathy: Secondary | ICD-10-CM | POA: Diagnosis present

## 2014-10-12 DIAGNOSIS — N179 Acute kidney failure, unspecified: Secondary | ICD-10-CM | POA: Diagnosis present

## 2014-10-12 DIAGNOSIS — I240 Acute coronary thrombosis not resulting in myocardial infarction: Secondary | ICD-10-CM | POA: Diagnosis present

## 2014-10-12 DIAGNOSIS — I959 Hypotension, unspecified: Secondary | ICD-10-CM | POA: Diagnosis present

## 2014-10-12 DIAGNOSIS — H539 Unspecified visual disturbance: Secondary | ICD-10-CM

## 2014-10-12 DIAGNOSIS — T45516A Underdosing of anticoagulants, initial encounter: Secondary | ICD-10-CM | POA: Diagnosis present

## 2014-10-12 DIAGNOSIS — Z7982 Long term (current) use of aspirin: Secondary | ICD-10-CM | POA: Diagnosis not present

## 2014-10-12 DIAGNOSIS — K5900 Constipation, unspecified: Secondary | ICD-10-CM | POA: Diagnosis not present

## 2014-10-12 DIAGNOSIS — F1011 Alcohol abuse, in remission: Secondary | ICD-10-CM | POA: Insufficient documentation

## 2014-10-12 DIAGNOSIS — E876 Hypokalemia: Secondary | ICD-10-CM | POA: Diagnosis present

## 2014-10-12 DIAGNOSIS — N189 Chronic kidney disease, unspecified: Secondary | ICD-10-CM | POA: Diagnosis present

## 2014-10-12 DIAGNOSIS — Z8249 Family history of ischemic heart disease and other diseases of the circulatory system: Secondary | ICD-10-CM

## 2014-10-12 DIAGNOSIS — I248 Other forms of acute ischemic heart disease: Secondary | ICD-10-CM | POA: Diagnosis present

## 2014-10-12 DIAGNOSIS — R600 Localized edema: Secondary | ICD-10-CM

## 2014-10-12 DIAGNOSIS — Z7901 Long term (current) use of anticoagulants: Secondary | ICD-10-CM

## 2014-10-12 DIAGNOSIS — I513 Intracardiac thrombosis, not elsewhere classified: Secondary | ICD-10-CM

## 2014-10-12 DIAGNOSIS — I509 Heart failure, unspecified: Secondary | ICD-10-CM

## 2014-10-12 LAB — COMPREHENSIVE METABOLIC PANEL
ALT: 167 U/L — ABNORMAL HIGH (ref 17–63)
AST: 129 U/L — AB (ref 15–41)
Albumin: 3.2 g/dL — ABNORMAL LOW (ref 3.5–5.0)
Alkaline Phosphatase: 85 U/L (ref 38–126)
Anion gap: 13 (ref 5–15)
BILIRUBIN TOTAL: 1.3 mg/dL — AB (ref 0.3–1.2)
BUN: 22 mg/dL — ABNORMAL HIGH (ref 6–20)
CHLORIDE: 101 mmol/L (ref 101–111)
CO2: 22 mmol/L (ref 22–32)
Calcium: 8.3 mg/dL — ABNORMAL LOW (ref 8.9–10.3)
Creatinine, Ser: 1.56 mg/dL — ABNORMAL HIGH (ref 0.61–1.24)
GFR calc Af Amer: >60 mL/min (ref >60)
GFR calc non Af Amer: 52 mL/min — ABNORMAL LOW (ref >60)
Glucose, Bld: 105 mg/dL — ABNORMAL HIGH (ref 65–99)
POTASSIUM: 3.4 mmol/L — AB (ref 3.5–5.1)
Sodium: 136 mmol/L (ref 135–145)
TOTAL PROTEIN: 6.5 g/dL (ref 6.5–8.1)

## 2014-10-12 LAB — DIFFERENTIAL
BASOS PCT: 0 % (ref 0–1)
Basophils Absolute: 0 10*3/uL (ref 0.0–0.1)
Eosinophils Absolute: 0.2 10*3/uL (ref 0.0–0.7)
Eosinophils Relative: 1 % (ref 0–5)
Lymphocytes Relative: 11 % — ABNORMAL LOW (ref 12–46)
Lymphs Abs: 1.4 10*3/uL (ref 0.7–4.0)
MONO ABS: 1 10*3/uL (ref 0.1–1.0)
MONOS PCT: 9 % (ref 3–12)
NEUTROS ABS: 9.6 10*3/uL — AB (ref 1.7–7.7)
NEUTROS PCT: 79 % — AB (ref 43–77)

## 2014-10-12 LAB — CBC
HCT: 39.9 % (ref 39.0–52.0)
Hemoglobin: 14.3 g/dL (ref 13.0–17.0)
MCH: 31.2 pg (ref 26.0–34.0)
MCHC: 35.8 g/dL (ref 30.0–36.0)
MCV: 86.9 fL (ref 78.0–100.0)
Platelets: 288 10*3/uL (ref 150–400)
RBC: 4.59 MIL/uL (ref 4.22–5.81)
RDW: 15.3 % (ref 11.5–15.5)
WBC: 12.4 10*3/uL — AB (ref 4.0–10.5)

## 2014-10-12 LAB — PROTIME-INR
INR: 1.21 (ref 0.00–1.49)
Prothrombin Time: 15.5 seconds — ABNORMAL HIGH (ref 11.6–15.2)

## 2014-10-12 LAB — ABO/RH: ABO/RH(D): B POS

## 2014-10-12 LAB — I-STAT CG4 LACTIC ACID, ED: Lactic Acid, Venous: 2.54 mmol/L (ref 0.5–2.0)

## 2014-10-12 LAB — TYPE AND SCREEN
ABO/RH(D): B POS
Antibody Screen: NEGATIVE

## 2014-10-12 LAB — LIPASE, BLOOD: Lipase: 42 U/L (ref 22–51)

## 2014-10-12 LAB — BRAIN NATRIURETIC PEPTIDE: B Natriuretic Peptide: 879 pg/mL — ABNORMAL HIGH (ref 0.0–100.0)

## 2014-10-12 LAB — TROPONIN I: TROPONIN I: 0.21 ng/mL — AB (ref <0.031)

## 2014-10-12 MED ORDER — SODIUM CHLORIDE 0.9 % IJ SOLN: 3.0000 mL | INTRAMUSCULAR | Status: DC | PRN

## 2014-10-12 MED ORDER — ASPIRIN 81 MG PO TBEC: 81.0000 mg | DELAYED_RELEASE_TABLET | Freq: Every day | ORAL | Status: DC

## 2014-10-12 MED ORDER — SODIUM CHLORIDE 0.9 % IV SOLN: 250.0000 mL | INTRAVENOUS | Status: DC | PRN

## 2014-10-12 MED ORDER — SODIUM CHLORIDE 0.9 % IV BOLUS (SEPSIS)
1000.0000 mL | Freq: Once | INTRAVENOUS | Status: AC
  Administered 2014-10-12: 1000 mL via INTRAVENOUS

## 2014-10-12 MED ORDER — ACETAMINOPHEN 325 MG PO TABS
650.0000 mg | ORAL_TABLET | Freq: Once | ORAL | Status: AC
  Administered 2014-10-12: 650 mg via ORAL
  Filled 2014-10-12: qty 2

## 2014-10-12 MED ORDER — IOHEXOL 350 MG/ML SOLN
100.0000 mL | Freq: Once | INTRAVENOUS | Status: AC | PRN
  Administered 2014-10-12: 100 mL via INTRAVENOUS

## 2014-10-12 MED ORDER — ONDANSETRON HCL 4 MG PO TABS: 4.0000 mg | ORAL_TABLET | Freq: Four times a day (QID) | ORAL | Status: DC | PRN

## 2014-10-12 MED ORDER — HYDROCODONE-ACETAMINOPHEN 5-325 MG PO TABS
1.0000 | ORAL_TABLET | ORAL | Status: DC | PRN
  Administered 2014-10-12 – 2014-10-19 (×24): 2 via ORAL
  Filled 2014-10-12 (×24): qty 2

## 2014-10-12 MED ORDER — ASPIRIN EC 81 MG PO TBEC
81.0000 mg | DELAYED_RELEASE_TABLET | Freq: Every day | ORAL | Status: DC
  Administered 2014-10-13 – 2014-10-19 (×7): 81 mg via ORAL
  Filled 2014-10-12 (×7): qty 1

## 2014-10-12 MED ORDER — FUROSEMIDE 40 MG PO TABS
40.0000 mg | ORAL_TABLET | Freq: Every day | ORAL | Status: DC
  Filled 2014-10-12: qty 1

## 2014-10-12 MED ORDER — ACETAMINOPHEN 325 MG PO TABS: 650.0000 mg | ORAL_TABLET | Freq: Four times a day (QID) | ORAL | Status: DC | PRN

## 2014-10-12 MED ORDER — DEXTROSE 5 % IV SOLN
1.0000 g | Freq: Once | INTRAVENOUS | Status: AC
  Administered 2014-10-12: 1 g via INTRAVENOUS
  Filled 2014-10-12: qty 10

## 2014-10-12 MED ORDER — PHENOL 1.4 % MT LIQD
1.0000 | OROMUCOSAL | Status: DC | PRN
  Filled 2014-10-12: qty 177

## 2014-10-12 MED ORDER — WARFARIN SODIUM 10 MG PO TABS
10.0000 mg | ORAL_TABLET | Freq: Every day | ORAL | Status: AC
Start: 2014-10-12 — End: 2014-10-13
  Filled 2014-10-12: qty 1

## 2014-10-12 MED ORDER — HEPARIN SODIUM (PORCINE) 5000 UNIT/ML IJ SOLN
5000.0000 [IU] | Freq: Three times a day (TID) | INTRAMUSCULAR | Status: DC
  Administered 2014-10-12 – 2014-10-13 (×2): 5000 [IU] via SUBCUTANEOUS
  Filled 2014-10-12 (×3): qty 1

## 2014-10-12 MED ORDER — SODIUM CHLORIDE 0.9 % IJ SOLN
3.0000 mL | Freq: Two times a day (BID) | INTRAMUSCULAR | Status: DC
  Administered 2014-10-12: 3 mL via INTRAVENOUS

## 2014-10-12 MED ORDER — IPRATROPIUM-ALBUTEROL 0.5-2.5 (3) MG/3ML IN SOLN
3.0000 mL | Freq: Once | RESPIRATORY_TRACT | Status: AC
  Administered 2014-10-12: 3 mL via RESPIRATORY_TRACT
  Filled 2014-10-12: qty 3

## 2014-10-12 MED ORDER — WARFARIN - PHARMACIST DOSING INPATIENT
Freq: Every day | Status: DC
  Administered 2014-10-17: 18:00:00

## 2014-10-12 MED ORDER — MORPHINE SULFATE 2 MG/ML IJ SOLN: 2.0000 mg | INTRAMUSCULAR | Status: DC | PRN

## 2014-10-12 MED ORDER — SODIUM CHLORIDE 0.9 % IV SOLN: INTRAVENOUS | Status: DC

## 2014-10-12 MED ORDER — BENZONATATE 100 MG PO CAPS
100.0000 mg | ORAL_CAPSULE | Freq: Three times a day (TID) | ORAL | Status: DC | PRN
  Administered 2014-10-13 (×2): 100 mg via ORAL
  Filled 2014-10-12 (×4): qty 1

## 2014-10-12 MED ORDER — SODIUM CHLORIDE 0.9 % IJ SOLN
3.0000 mL | Freq: Two times a day (BID) | INTRAMUSCULAR | Status: DC
  Administered 2014-10-13 – 2014-10-19 (×8): 3 mL via INTRAVENOUS

## 2014-10-12 MED ORDER — DEXTROSE 5 % IV SOLN
500.0000 mg | Freq: Once | INTRAVENOUS | Status: AC
  Administered 2014-10-12: 500 mg via INTRAVENOUS
  Filled 2014-10-12: qty 500

## 2014-10-12 MED ORDER — FUROSEMIDE 10 MG/ML IJ SOLN
60.0000 mg | Freq: Once | INTRAMUSCULAR | Status: AC
Start: 2014-10-12 — End: 2014-10-13
  Administered 2014-10-13: 60 mg via INTRAVENOUS
  Filled 2014-10-12: qty 6

## 2014-10-12 MED ORDER — ONDANSETRON HCL 4 MG/2ML IJ SOLN: 4.0000 mg | Freq: Four times a day (QID) | INTRAMUSCULAR | Status: DC | PRN

## 2014-10-12 MED ORDER — ACETAMINOPHEN 650 MG RE SUPP: 650.0000 mg | Freq: Four times a day (QID) | RECTAL | Status: DC | PRN

## 2014-10-12 MED ORDER — MORPHINE SULFATE 2 MG/ML IJ SOLN
2.0000 mg | Freq: Once | INTRAMUSCULAR | Status: AC
  Administered 2014-10-12: 2 mg via INTRAVENOUS
  Filled 2014-10-12: qty 1

## 2014-10-12 NOTE — H&P (Signed)
Family Medicine Teaching [facility] Admission History and Physical Service Pager: [phone]  Patient name: Robert Booth Medical record number: [id number] Date of birth: [birth year] Age: 44 y.o. Gender: male  Primary Care Provider: No primary care provider on file. Consultants: Cardiology (in AM) Code Status: FULL  Chief Complaint: SOB, cough, hemoptysis, pleuritic chest pain  Assessment and Plan: Kasten Leveque is a 44 y.o. male presenting with cough, SOB, hemoptysis and pleuritic chest pain. PMH is significant for HTN, dilated cardiomyopathy (NICM, reduced EF)alcoholism, and anxiety.   DOE / Hemoptysis, presumed RLL CAP Cough beginning 10 days ago with increasing SOB / DOE and hemoptysis today (observed on admit). Also reports worsening pleuritic R flank, back pain. Most likely CAP given imaging (CXR then confirmed on CTA with RLL consolidation likely pneumonia less likely infarct) despite lack of systemic symptoms, afebrile, no hypoxia. Received one dose ctx and Duoneb in ED. Denies fevers. WBC 12.4 on admission. Meets SIRS criteria and likely source, considered Sepsis (not severe, BP improved). Differential includes initial high concern for PE (Well's score 4, active LV thrombus and off Coumadin x 1 week INR 1.2, however Chest CTA w/o evidence acute PE, can't r/o distal PE), ?malignancy (h/o tobacco). Now with some fluid overload on exam, concern for component of possible acute on CHF exac.  - admit, attending Dr. Randolm Idol - S/p CTX, azithromycin in ED - Continue CTX 1g IV q 24 hr (7/13>>) and Azithro switch to PO  qd x 4 days (7/13>7/17) - Repeat CXR PA and lateral in AM - BMP, CBC, Mag, INR in AM - Lactic Acid 2.54 >> trending - BCx pending (after abx) - Norco for chest pain - UDS in progress - add Tessalon, chloroseptic spray for cough - Consult pharmacy for Coumadin management - decision to hold Coumadin dose tonight, despite sub-therapeutic INR 1.2 with acute hemoptysis.  Re-evaluate tomorrow and discuss with Cardiology.  Presumed Acute on Chronic Systolic CHF / Dilated NICM Dx in 04/2014, prior ECHO reduced LVEF 20% (per Cards, no record of ECHO in [place]), s/p cardiac cath in 2016 showed normal coronary arteries. Dx LV thrombus in 03/16. Missed three doses of Lasix last week, also stopped taking coumadin last week. Bilateral pitting edema on exam today. Lungs with some crackles but overall poor air movement. Positive orthopnea. Additionally, no active chest pressure/tightness or typical anginal symptoms, 43 yr w/o CAD, ACS is highly unlikely, suspect troponin related to demand ischemia. - Elevated BNP 879 but may be consistently elevated due to dilated cardiomyopathy (no comparison lab) - Trop on admission 0.21 - continue to trend per cards - EKG - sinus tach with T wave abnormality; repeat in AM  - Echo in AM - Lasix 60 mg IV x1 - monitor BP - Strict I/O, Daily weights - Formal Cardiology consult in AM. Cardiology on-call notified tonight, recommended continue to trend troponins however unlikely to be concern for MI unless significantly more increased and new CP, hold coumadin due to hemoptysis, continue Lasix.  Vision change: reports "un-focused but not blurry" vision for past two days. Reports closing either eye improves vision.  - Continue to monitor for new or improving vision changes  AKI Elevated SCr 1.56 (0.95 in 2014)  - IVF - Continue to monitor - Hold nephrotoxic meds (ARB)  HTN On admit, low BP with SBP 90-100s. Gradually improving. Concern with low BP and significant amount of pain initially. BP improved with initial IVF in ED, continues to improve. No evidence hypotension, concern for sepsis with CAP  but no evidence severe sepsis. - Hold home losartan, metoprolol  Anxiety: not currently medically treated  Alcohol Abuse - CIWA initially, follow scores if stable can DC 1-2 days, unless clinical evidence withdrawal  FEN/GI: heart healthy  diet  Prophylaxis: Sub Q Heparin, hold anticoagulants per cards    Disposition: admit to floor   History of Present Illness: Robert Booth is a 44 y.o. male presenting with cough, chest pain, hemoptysis and SOB.  Mr. Katrinka Blazing reports that six months ago in 04/2014 (while in [place]) he thought he had a cold, which lasted for three weeks. He eventually became so SOB that he could not breathe, so he presented to the hospital, where he was told he had pneumonia and heart failure. After discharge, he developed similar symptoms about three weeks later. He was admitted again and was found to have a LV thrombus but no PNA. Before this admission he reports that he couldn't walk very far without getting winded, and he reports similar symptoms today. He thinks his last echo was performed during his last hospitalization four months ago (05/2014) when he was told his EF was 20%. He says he was told his heart failure was probably due mostly to alcoholism.   At his hospital follow-up appointment in May 2016 [facility]), his cardiologist told him to continue Lasix and do a 30 min workout every day. Has been doing well, including exercising, until 10 days ago, when he skipped Lasix for three days. He started coughing, which he attributed to fluid build up from not taking his Lasix. He had no hemoptysis at that time. In the last week, he reports becoming increasingly SOB, especially on exertion. He also developed intermittent chest pain. Two days ago he had sudden onset visual changes, which he describes as "un-focused" rather than blurry. This morning he developed pain in his R flank and back. He also noticed hemoptysis. He endorses one episode of chills, but no fevers. Of note, he stopped his coumadin about a week ago (before the hemoptysis) when he began to cough more, because he was afraid he would "rupture an organ".  He reports that it is difficult to take a deep breath because of the pain. He feels like he is going to  drown when lying down, and feels most comfortable sitting up and leaning forward. His vision remains "unfocused." He says he has difficulty texting now, but if he closes one eye (either eye) his focus improves.   His last known dry weight was 170 lb measured 4 months ago. Today he weighs ~190 lb. He says he has been sober for five months. He denied illicit drug use, however per chart review endorsed drug use to cardiology in 07/2014.    Review Of Systems: Per HPI with the following additions: endorses decreased urinary volume; denies N/V/C/D,  BRBPR/hematuria, weight loss Otherwise 12 point review of systems was performed and was unremarkable.  Patient Active Problem List   Diagnosis Date Noted  . Pneumonia 10/12/2014  . Nonischemic cardiomyopathy 08/18/2014  . HTN (hypertension) 08/18/2014   Past Medical History: Past Medical History  Diagnosis Date  . Hypertension   . Alcoholism     Sober since Feb  . Anxiety    Past Surgical History: Past Surgical History  Procedure Laterality Date  . Hernia repair Right Inguinal  . Knee surgery Right 1998   Social History: History  Substance Use Topics  . Smoking status: Former Smoker    Types: Cigarettes  . Smokeless tobacco: Former Neurosurgeon  Types: Chew     Comment: Was a rare smoker  . Alcohol Use: No   Additional social history: From [place] but went to school in [place]. Lives in [place] with his mother now.  Please also refer to relevant sections of EMR.  Family History: Family History  Problem Relation Age of Onset  . Hypertension Mother   . Hypertension Father   . Heart attack Father 69    died  . Hypertension Sister   . Heart attack Paternal Grandfather 50    died   Allergies and Medications: Allergies  Allergen Reactions  . Celexa [Citalopram Hydrobromide] Other (See Comments)    DISORIENTED  . Lisinopril Cough   No current facility-administered medications on file prior to encounter.   Current Outpatient Prescriptions on  File Prior to Encounter  Medication Sig Dispense Refill  . furosemide (LASIX) 40 MG tablet Take 1 tablet (40 mg total) by mouth daily. 30 tablet 2  . losartan (COZAAR) 50 MG tablet Take 1 tablet (50 mg total) by mouth daily. 90 tablet 3  . metoprolol succinate (TOPROL-XL) 50 MG 24 hr tablet Take 1 tablet (50 mg total) by mouth daily. Take with or immediately following a meal. 90 tablet 3  . warfarin (COUMADIN) 10 MG tablet Take 1 tablet (10 mg total) by mouth daily. (Patient taking differently: Take 10 mg by mouth daily at 6 PM. ) 30 tablet 1  . aspirin 81 MG EC tablet Take 1 tablet (81 mg total) by mouth daily. Swallow whole. 30 tablet 12    Objective: BP 95/53 mmHg  Pulse 94  Temp(Src) 97.7 F (36.5 C) (Tympanic)  Resp 18  Ht 5\' 9"  (1.753 m)  Wt 195 lb 11.2 oz (88.769 kg)  BMI 28.89 kg/m2  SpO2 97% Exam: General: leaning forward sitting in chair, coughing with hemoptysis after breathing deeply for auscultation, cooperative but moderately uncomfortable Eyes: PERRLA ENTM: MMM, no blood in oropharynx or nares Cardiovascular: RRR, no murmurs appreciated Respiratory: Generalized diminished air flow, no rhonchi/crackles/wheezing Abdomen: mild tenderness upon palpation of LLQ, soft, non-distended, +BS MSK: R flank tenderness, upper R-back +TTP Ext: +1 pitting edema below knees bilaterally, calves non-tender, no asymmetry or erythema. Skin: no bruises or rashes noted Neuro: 5/5 strength LE bilaterally, no focal deficits, A&Ox3 Psych: appropriate mood and affect  Labs and Imaging: CBC BMET   Recent Labs Lab 10/12/14 1710  WBC 12.4*  HGB 14.3  HCT 39.9  PLT 288    Recent Labs Lab 10/12/14 1710  NA 136  K 3.4*  CL 101  CO2 22  BUN 22*  CREATININE 1.56*  GLUCOSE 105*  CALCIUM 8.3*     Dg Chest 2 View  10/12/2014   CLINICAL DATA:  44 year old male with 1 week history of productive cough and 1 day history of hemoptysis accompanied by right flank and upper back pain.   EXAM: CHEST  2 VIEW  COMPARISON:  None.  FINDINGS: Focal patchy airspace opacity in the anterior aspect of the right lower lobe concerning for bronchopneumonia. Otherwise, the lungs are clear. The cardiac and mediastinal contours are within normal limits. Osseous structures are intact and unremarkable.  IMPRESSION: Imaging findings are most consistent with right lower lobe pneumonia.   Electronically Signed   By: Malachy Moan M.D.   On: 10/12/2014 15:55   Ct Angio Chest Pe W/cm &/or Wo Cm  10/12/2014   CLINICAL DATA:  44 year old male with right-sided chest pain and shortness of breath  EXAM: CT ANGIOGRAPHY CHEST  WITH CONTRAST  TECHNIQUE: Multidetector CT imaging of the chest was performed using the standard protocol during bolus administration of intravenous contrast. Multiplanar CT image reconstructions and MIPs were obtained to evaluate the vascular anatomy.  CONTRAST:  OMNIPAQUE IOHEXOL 350 MG/ML SOLN  COMPARISON:  Chest radiograph dated 10/12/2014  FINDINGS: There is a patchy area ground-glass and nodular opacities in the right lower lobe. There is non opacification of a distal subsegmental branch of the pulmonary artery extending to this consolidated portion of the right lower lobe. This may be artifactual or represent hypoperfusion secondary to hypoventilation. Distal branch pulmonary artery embolus is not excluded. Correlation with clinical exam and signs and symptoms for pneumonia recommended. No other pulmonary embolus identified. There is a small right pleural effusion. Small patchy areas of low-attenuation in the left upper and left lower lobe most compatible with air trapping. The central airways are patent.  The visualized thoracic aorta appear unremarkable. Mild cardiomegaly. No pericardial effusion. No hilar or mediastinal lymphadenopathy. The upper mediastinum appears unremarkable. The chest wall and the visualized upper abdomen is unremarkable. No acute fracture.  Review of the MIP  images confirms the above findings.  IMPRESSION: Patchy right lower lobe consolidation most compatible with pneumonia and less likely representing infarct. Apparent non-opacification of the distal subsegmental pulmonary artery branch extending into the consolidated area may be artifactual or represent focal hypoperfusion. A distal branch pulmonary embolism is less likely, given no other pulmonary embolus identified, but not entirely excluded. Correlation with clinical exam and sign and symptoms of pneumonia recommended.   Electronically Signed   By: Elgie Collard M.D.   On: 10/12/2014 18:27     [name], MD 10/12/2014, 10:16 PM PGY-1, [facility] FPTS Intern pager: [phone], text pages welcome  Upper Level Addendum:  I have seen and evaluated this patient along with Dr. Natale Milch and reviewed the above note, making necessary revisions in purple.

## 2014-10-12 NOTE — ED Provider Notes (Signed)
[name]: [id number]     Arrival date & time 10/12/14  1329 History   First MD Initiated Contact with Robert Booth 10/12/14 1440     Chief Complaint  Robert Booth presents with  . Hemoptysis  . Shortness of Breath  . Chest Pain   (Consider location/radiation/quality/duration/timing/severity/associated sxs/prior Treatment) HPI  Pt copmlaining shortness breath, intermittent chest pain, and hemoptysis. Robert Booth states that Robert Booth stopped taking Robert Booth Coumadin one week ago as Robert Booth is worried that all the coughing may cause him to bleed in Robert Booth lungs. Robert Booth developed hemoptysis 1 day ago. Denies fevers, rash, headache, neck stiffness, palpitations. Robert Booth states that Robert Booth is continuing to take Robert Booth Lasix 40 mg daily as well as all Robert Booth other medications as prescribed. Pain in Robert Booth chest is worse with deep breathing. Robert Booth also states is that Robert Booth has now had vision change in Robert Booth right eye for the last 2 days.    Past Medical History  Diagnosis Date  . Hypertension   . Alcoholism     Sober since Feb  . Anxiety    Past Surgical History  Procedure Laterality Date  . Hernia repair Right Inguinal  . Knee surgery Right 1998   Family History  Problem Relation Age of Onset  . Hypertension Mother   . Hypertension Father   . Heart attack Father 23    died  . Hypertension Sister   . Heart attack Paternal Grandfather 50    died   History  Substance Use Topics  . Smoking status: Former Smoker    Types: Cigarettes  . Smokeless tobacco: Former Neurosurgeon    Types: Chew     Comment: Was a rare smoker  . Alcohol Use: No    Review of Systems Per HPI with all other pertinent systems negative.   Allergies  Review of Robert Booth's allergies indicates no known allergies.  Home Medications   Prior to Admission medications   Medication Sig Start Date End Date Taking? Authorizing Provider  aspirin 81 MG EC tablet Take 1 tablet (81 mg total) by mouth daily. Swallow whole. 07/28/12   Richarda Overlie, MD  furosemide (LASIX) 40 MG  tablet Take 1 tablet (40 mg total) by mouth daily. 10/10/14   Rollene Rotunda, MD  losartan (COZAAR) 50 MG tablet Take 1 tablet (50 mg total) by mouth daily. 09/26/14   Rollene Rotunda, MD  metoprolol succinate (TOPROL-XL) 50 MG 24 hr tablet Take 1 tablet (50 mg total) by mouth daily. Take with or immediately following a meal. 09/26/14   Rollene Rotunda, MD  warfarin (COUMADIN) 10 MG tablet Take 1 tablet (10 mg total) by mouth daily. 10/10/14   Rollene Rotunda, MD   BP 100/73 mmHg  Pulse 110  Temp(Src) 97.7 F (36.5 C) (Oral)  Resp 18  SpO2 97% Physical Exam Physical Exam  Constitutional: Mild distress  HENT:  Head: Normocephalic and atraumatic.  Eyes: EOMI. PERRL.  Neck: Normal range of motion.  Cardiovascular: RRR, no m/r/g, 2+ distal pulses, 1+ right lower extremity edema. Pulmonary/Chest: Mild increased respiratory effort, no crackles wheezes or rhonchi. 98% on room air..  Abdominal: Soft. Bowel sounds are normal. NonTTP, no distension.  Musculoskeletal: Normal range of motion. Non ttp, no effusion.  Neurological: alert and oriented to person, place, and time.  Skin: Skin is warm. No rash noted. non diaphoretic.  Psychiatric: normal mood and affect. behavior is normal. Judgment and thought content normal.   ED Course  Procedures (including critical care time) Labs Review Labs Reviewed - No data to  display  Imaging Review No results found.   MDM   1. Cough   2. Hemoptysis   3. Edema of right lower extremity   4. Vision changes      Pts complaints worriesome of underlying significant etiology including DVT/PE, pneumonia, Acute on chronic CHF.  Also pts complaint of acute vision change while off coumadin warrants further workup.   Pt deferring EMS transfer and will go by shuttle.   ED called to make them aware of acuity of pt illness  EKG w/o sign of ACS.   Ozella Rocks, MD 10/12/14 [phone]

## 2014-10-12 NOTE — ED Notes (Addendum)
Pt reports going to ucc today due to sob, reports a cough x 1 week, coughing up blood x 2-3 days. Reports feeling bloated and pain when he walks. Airway is intact at triage. hasnt taken warfarin x 1 week.

## 2014-10-12 NOTE — Discharge Instructions (Signed)
Your symptoms are concerning for a significant underlying medical condition such as heart failure exacerbation, lung infection, lung clot, or leg blood clot. Please go to the emergency room immediately

## 2014-10-12 NOTE — ED Notes (Signed)
Report attempted 

## 2014-10-12 NOTE — ED Provider Notes (Signed)
[name]: [id number]     Arrival date & time 10/12/14  1511 History   First MD Initiated Contact with Patient 10/12/14 1607     Chief Complaint  Patient presents with  . Hemoptysis  . Shortness of Breath   HPI Patient is a 44 year old male with a history of PE, heart failure, hypertension, alcoholism presented today for cough fevers and hemoptysis for the last 3 days. Patient reports began having a cough and shortness of breath 1 week ago. Stopped taking his Coumadin at that time. Progressively increased to where he was coughing up blood for the past 2 days. Denies any frank precordial chest pain but right flank/lower rib pain is present. This is pleuritic in nature, rated as a 6 out of 10, with no alleviating factors.. Denies any palpitations nausea, vomiting, abdominal pain.  Past Medical History  Diagnosis Date  . Hypertension   . Alcoholism     Sober since Feb  . Anxiety    Past Surgical History  Procedure Laterality Date  . Hernia repair Right Inguinal  . Knee surgery Right 1998   Family History  Problem Relation Age of Onset  . Hypertension Mother   . Hypertension Father   . Heart attack Father 17    died  . Hypertension Sister   . Heart attack Paternal Grandfather 50    died   History  Substance Use Topics  . Smoking status: Former Smoker    Types: Cigarettes  . Smokeless tobacco: Former Neurosurgeon    Types: Chew     Comment: Was a rare smoker  . Alcohol Use: No    Review of Systems  Constitutional: Negative for fever and chills.  HENT: Negative for congestion and sore throat.   Eyes: Negative for pain.  Respiratory: Positive for cough, chest tightness and shortness of breath.        Hemoptysis  Cardiovascular: Negative for chest pain and palpitations.  Gastrointestinal: Negative for nausea, vomiting, abdominal pain and diarrhea.  Endocrine: Negative.   Genitourinary: Negative for flank pain.  Musculoskeletal: Negative for back pain and neck pain.  Skin: Negative for  rash.  Allergic/Immunologic: Negative.   Neurological: Negative for dizziness, syncope and light-headedness.  Psychiatric/Behavioral: Negative for confusion.    Allergies  Celexa and Lisinopril  Home Medications   Prior to Admission medications   Medication Sig Start Date End Date Taking? Authorizing Provider  furosemide (LASIX) 40 MG tablet Take 1 tablet (40 mg total) by mouth daily. 10/10/14  Yes Rollene Rotunda, MD  losartan (COZAAR) 50 MG tablet Take 1 tablet (50 mg total) by mouth daily. 09/26/14  Yes Rollene Rotunda, MD  metoprolol succinate (TOPROL-XL) 50 MG 24 hr tablet Take 1 tablet (50 mg total) by mouth daily. Take with or immediately following a meal. 09/26/14  Yes Rollene Rotunda, MD  warfarin (COUMADIN) 10 MG tablet Take 1 tablet (10 mg total) by mouth daily. Patient taking differently: Take 10 mg by mouth daily at 6 PM.  10/10/14  Yes Rollene Rotunda, MD  aspirin 81 MG EC tablet Take 1 tablet (81 mg total) by mouth daily. Swallow whole. 07/28/12   Richarda Overlie, MD   BP 117/78 mmHg  Pulse 95  Temp(Src) 97.7 F (36.5 C) (Oral)  Resp 109  Ht  (1.753 m)  Wt 195 lb 11.2 oz (88.769 kg)  BMI 28.89 kg/m2  SpO2 93% Physical Exam  Constitutional: He is oriented to person, place, and time. He appears well-developed and well-nourished.  Pt with active  cough and hemoptysis on exam.   HENT:  Head: Normocephalic and atraumatic.  Eyes: Conjunctivae and EOM are normal. Pupils are equal, round, and reactive to light.  Neck: Normal range of motion. Neck supple.  Cardiovascular: Regular rhythm, normal heart sounds and intact distal pulses.  Tachycardia present.   Pulses:      Radial pulses are 2+ on the right side, and 2+ on the left side.  Pulmonary/Chest: Effort normal. No respiratory distress. He has decreased breath sounds in the right middle field and the right lower field. He has rales in the right lower field.  Abdominal: Soft. Bowel sounds are normal. There is no tenderness.   Musculoskeletal: Normal range of motion.  Neurological: He is alert and oriented to person, place, and time. He has normal reflexes. No cranial nerve deficit.  Skin: Skin is warm and dry.    ED Course  Procedures (including critical care time) Labs Review Labs Reviewed  CBC - Abnormal; Notable for the following:    WBC 12.4 (*)    All other components within normal limits  PROTIME-INR - Abnormal; Notable for the following:    Prothrombin Time 15.5 (*)    All other components within normal limits  DIFFERENTIAL - Abnormal; Notable for the following:    Neutrophils Relative % 79 (*)    Neutro Abs 9.6 (*)    Lymphocytes Relative 11 (*)    All other components within normal limits  BRAIN NATRIURETIC PEPTIDE - Abnormal; Notable for the following:    B Natriuretic Peptide 879.0 (*)    All other components within normal limits  COMPREHENSIVE METABOLIC PANEL - Abnormal; Notable for the following:    Potassium 3.4 (*)    Glucose, Bld 105 (*)    BUN 22 (*)    Creatinine, Ser 1.56 (*)    Calcium 8.3 (*)    Albumin 3.2 (*)    AST 129 (*)    ALT 167 (*)    Total Bilirubin 1.3 (*)    GFR calc non Af Amer 52 (*)    All other components within normal limits  TROPONIN I - Abnormal; Notable for the following:    Troponin I 0.21 (*)    All other components within normal limits  URINE RAPID DRUG SCREEN, HOSP PERFORMED - Abnormal; Notable for the following:    Opiates POSITIVE (*)    All other components within normal limits  I-STAT CG4 LACTIC ACID, ED - Abnormal; Notable for the following:    Lactic Acid, Venous 2.54 (*)    All other components within normal limits  CULTURE, BLOOD (ROUTINE X 2)  CULTURE, BLOOD (ROUTINE X 2)  LIPASE, BLOOD  LACTIC ACID, PLASMA  TROPONIN I  TROPONIN I  MAGNESIUM  PROTIME-INR  LACTIC ACID, PLASMA  BASIC METABOLIC PANEL  CBC  TYPE AND SCREEN  ABO/RH    Imaging Review Dg Chest 2 View  10/12/2014   CLINICAL DATA:  44 year old male with 1 week  history of productive cough and 1 day history of hemoptysis accompanied by right flank and upper back pain.  EXAM: CHEST  2 VIEW  COMPARISON:  None.  FINDINGS: Focal patchy airspace opacity in the anterior aspect of the right lower lobe concerning for bronchopneumonia. Otherwise, the lungs are clear. The cardiac and mediastinal contours are within normal limits. Osseous structures are intact and unremarkable.  IMPRESSION: Imaging findings are most consistent with right lower lobe pneumonia.   Electronically Signed   By: Isac Caddy.D.  On: 10/12/2014 15:55   Ct Angio Chest Pe W/cm &/or Wo Cm  10/12/2014   CLINICAL DATA:  44 year old male with right-sided chest pain and shortness of breath  EXAM: CT ANGIOGRAPHY CHEST WITH CONTRAST  TECHNIQUE: Multidetector CT imaging of the chest was performed using the standard protocol during bolus administration of intravenous contrast. Multiplanar CT image reconstructions and MIPs were obtained to evaluate the vascular anatomy.  CONTRAST:  OMNIPAQUE IOHEXOL 350 MG/ML SOLN  COMPARISON:  Chest radiograph dated 10/12/2014  FINDINGS: There is a patchy area ground-glass and nodular opacities in the right lower lobe. There is non opacification of a distal subsegmental branch of the pulmonary artery extending to this consolidated portion of the right lower lobe. This may be artifactual or represent hypoperfusion secondary to hypoventilation. Distal branch pulmonary artery embolus is not excluded. Correlation with clinical exam and signs and symptoms for pneumonia recommended. No other pulmonary embolus identified. There is a small right pleural effusion. Small patchy areas of low-attenuation in the left upper and left lower lobe most compatible with air trapping. The central airways are patent.  The visualized thoracic aorta appear unremarkable. Mild cardiomegaly. No pericardial effusion. No hilar or mediastinal lymphadenopathy. The upper mediastinum appears  unremarkable. The chest wall and the visualized upper abdomen is unremarkable. No acute fracture.  Review of the MIP images confirms the above findings.  IMPRESSION: Patchy right lower lobe consolidation most compatible with pneumonia and less likely representing infarct. Apparent non-opacification of the distal subsegmental pulmonary artery branch extending into the consolidated area may be artifactual or represent focal hypoperfusion. A distal branch pulmonary embolism is less likely, given no other pulmonary embolus identified, but not entirely excluded. Correlation with clinical exam and sign and symptoms of pneumonia recommended.   Electronically Signed   By: Elgie Collard M.D.   On: 10/12/2014 18:27     EKG Interpretation   Date/Time:  Wednesday October 12 2014 15:16:17 EDT Ventricular Rate:  112 PR Interval:  128 QRS Duration: 84 QT Interval:  332 QTC Calculation: 453 R Axis:   62 Text Interpretation:  Sinus tachycardia T wave abnormality, consider  lateral ischemia Abnormal ECG When compared with ECG of 07/21/2012, HEART  RATE has increased T wave abnormality is now Present Confirmed by [facility]   MD, DAVID ([id number]) on 10/12/2014 5:17:33 PM      MDM   Final diagnoses:  Community acquired pneumonia   Patient is a 44 year old male with a history of PE, heart failure, hypertension, alcoholism presented today for cough fevers and hemoptysis for the last 3 days.  On initial evaluation patient tachycardic with borderline hypotensive pressures. Chest x-ray reported right lower lobe pneumonia. Lactic acid mildly elevated at 2.54. Leukocytosis of 12.4. INR 1.2 CR 1.56, BUN 22.   Patient started on empiric antibiotics for community-acquired pneumonia. EKG performed showing tachycardia with possible lateral ischemia. BNP elevated in the 800s. Troponin positive at 0.2. Likely related to demand in setting of sepsis. Multiple fluid boluses given. Unsure if consolidation on CXR was blood settling and  if possible PE could be present as well. CTA PE performed showing no acute PE. Most likely pneumonia associated with his hemoptysis.  Discussed case with Dr. Althea Charon and pt admitted for further tx and therapy of pneumonia and trending troponins.    If performed, labs, EKGs, and imaging were reviewed/interpreted by myself and my attending and incorporated into medical decision making.  Discussed pertinent finding with patient or caregiver prior to admission with no further questions.  Pt care supervised by my attending Dr. Lenox Ahr, MD PGY-2  Emergency Medicine   Tery Sanfilippo, MD 10/13/14 1250  Dione Booze, MD 10/14/14 [phone]

## 2014-10-12 NOTE — ED Notes (Signed)
Report attempted x2

## 2014-10-12 NOTE — ED Notes (Signed)
Pt has had a cough for over 1 week.  He has been coughing up blood for a few days and has had right sided pain.  Pt appears to be uncomfortable and he is SOB.

## 2014-10-12 NOTE — Progress Notes (Signed)
ANTICOAGULATION CONSULT NOTE - Initial Consult  Pharmacy Consult for coumadin Indication: LV thrombus  Allergies  Allergen Reactions  . Celexa [Citalopram Hydrobromide] Other (See Comments)    DISORIENTED  . Lisinopril Cough    Patient Measurements: Height: 5\' 9"  (175.3 cm) Weight: 195 lb 11.2 oz (88.769 kg) IBW/kg (Calculated) : 70.7   Vital Signs: Temp: 97.7 F (36.5 C) (07/13 2038) Temp Source: Tympanic (07/13 2038) BP: 95/53 mmHg (07/13 2038) Pulse Rate: 94 (07/13 2038)  Labs:  Recent Labs  10/12/14 1710  HGB 14.3  HCT 39.9  PLT 288  LABPROT 15.5*  INR 1.21  CREATININE 1.56*    Estimated Creatinine Clearance: 66.6 mL/min (by C-G formula based on Cr of 1.56).   Medical History: Past Medical History  Diagnosis Date  . Hypertension   . Alcoholism     Sober since Feb  . Anxiety      Assessment: 18 YOM presented with cough, SOB and hemoptysis. Pharmacy is consulted to resume coumadin. Coumadin was started in March, but patient stopped taking 1 week prior to admission d/t hemoptysis. hgb 14.3, plt wnl. INR 1.21  PTA dose: 10mg  daily.  Goal of Therapy:  INR 2-3 Monitor platelets by anticoagulation protocol: Yes   Plan:  - Coumadin 10mg  po daily - f/u PT/INR daily - Monitor s/sx of bleeding  Bayard Hugger, PharmD, BCPS  Clinical Pharmacist  Pager: [phone]   10/12/2014,10:25 PM

## 2014-10-12 NOTE — Progress Notes (Signed)
   10/12/14 2038  Vitals  Temp 97.7 F (36.5 C)  Temp Source Tympanic  BP (!) 95/53 mmHg  BP Location Left Arm  BP Method Automatic  Patient Position (if appropriate) Sitting  Pulse Rate 94  Resp 18  Oxygen Therapy  SpO2 97 %  O2 Device Room Air  Height and Weight  Height 5\' 9"  (1.753 m)  Weight 88.769 kg (195 lb 11.2 oz)  Type of Scale Used Standing  Type of Weight Actual  BSA (Calculated - sq m) 2.08 sq meters  BMI (Calculated) 29  Weight in (lb) to have BMI = 25 168.9  Admitted pt to rm 3E16 from ED, oriented to room, call bell placed within reach, pt c/o 9/10 right rib cage pain. MD at bedside.

## 2014-10-13 ENCOUNTER — Inpatient Hospital Stay (HOSPITAL_COMMUNITY): Payer: Medicaid Other

## 2014-10-13 DIAGNOSIS — I509 Heart failure, unspecified: Secondary | ICD-10-CM

## 2014-10-13 DIAGNOSIS — I5022 Chronic systolic (congestive) heart failure: Secondary | ICD-10-CM

## 2014-10-13 LAB — CBC
HEMATOCRIT: 37.5 % — AB (ref 39.0–52.0)
HEMOGLOBIN: 13.6 g/dL (ref 13.0–17.0)
MCH: 31.6 pg (ref 26.0–34.0)
MCHC: 36.3 g/dL — ABNORMAL HIGH (ref 30.0–36.0)
MCV: 87 fL (ref 78.0–100.0)
Platelets: 266 10*3/uL (ref 150–400)
RBC: 4.31 MIL/uL (ref 4.22–5.81)
RDW: 15.3 % (ref 11.5–15.5)
WBC: 15.3 10*3/uL — AB (ref 4.0–10.5)

## 2014-10-13 LAB — RAPID URINE DRUG SCREEN, HOSP PERFORMED
Amphetamines: NOT DETECTED
Barbiturates: NOT DETECTED
Benzodiazepines: NOT DETECTED
Cocaine: NOT DETECTED
OPIATES: POSITIVE — AB
TETRAHYDROCANNABINOL: NOT DETECTED

## 2014-10-13 LAB — BASIC METABOLIC PANEL
Anion gap: 13 (ref 5–15)
BUN: 20 mg/dL (ref 6–20)
CO2: 25 mmol/L (ref 22–32)
Calcium: 8.2 mg/dL — ABNORMAL LOW (ref 8.9–10.3)
Chloride: 99 mmol/L — ABNORMAL LOW (ref 101–111)
Creatinine, Ser: 1.35 mg/dL — ABNORMAL HIGH (ref 0.61–1.24)
GFR calc Af Amer: >60 mL/min (ref >60)
GFR calc non Af Amer: >60 mL/min (ref >60)
Glucose, Bld: 126 mg/dL — ABNORMAL HIGH (ref 65–99)
Potassium: 3 mmol/L — ABNORMAL LOW (ref 3.5–5.1)
SODIUM: 137 mmol/L (ref 135–145)

## 2014-10-13 LAB — LACTIC ACID, PLASMA
LACTIC ACID, VENOUS: 1.6 mmol/L (ref 0.5–2.0)
Lactic Acid, Venous: 2.1 mmol/L (ref 0.5–2.0)

## 2014-10-13 LAB — TROPONIN I
TROPONIN I: 0.16 ng/mL — AB (ref <0.031)
Troponin I: 0.21 ng/mL — ABNORMAL HIGH (ref <0.031)

## 2014-10-13 LAB — PROTIME-INR
INR: 1.31 (ref 0.00–1.49)
Prothrombin Time: 16.4 seconds — ABNORMAL HIGH (ref 11.6–15.2)

## 2014-10-13 LAB — MAGNESIUM: Magnesium: 1.8 mg/dL (ref 1.7–2.4)

## 2014-10-13 LAB — HEPARIN LEVEL (UNFRACTIONATED): Heparin Unfractionated: 0.26 IU/mL — ABNORMAL LOW (ref 0.30–0.70)

## 2014-10-13 MED ORDER — HEPARIN (PORCINE) IN NACL 100-0.45 UNIT/ML-% IJ SOLN
1700.0000 [IU]/h | INTRAMUSCULAR | Status: DC
  Administered 2014-10-13: 1400 [IU]/h via INTRAVENOUS
  Administered 2014-10-14 – 2014-10-15 (×3): 1550 [IU]/h via INTRAVENOUS
  Administered 2014-10-16 – 2014-10-17 (×2): 1700 [IU]/h via INTRAVENOUS
  Filled 2014-10-13 (×10): qty 250

## 2014-10-13 MED ORDER — FOLIC ACID 1 MG PO TABS
1.0000 mg | ORAL_TABLET | Freq: Every day | ORAL | Status: DC
  Administered 2014-10-13 – 2014-10-19 (×7): 1 mg via ORAL
  Filled 2014-10-13 (×7): qty 1

## 2014-10-13 MED ORDER — METOPROLOL SUCCINATE ER 100 MG PO TB24
100.0000 mg | ORAL_TABLET | Freq: Every day | ORAL | Status: DC
  Administered 2014-10-13 – 2014-10-19 (×7): 100 mg via ORAL
  Filled 2014-10-13 (×7): qty 1

## 2014-10-13 MED ORDER — POTASSIUM CHLORIDE CRYS ER 20 MEQ PO TBCR
40.0000 meq | EXTENDED_RELEASE_TABLET | Freq: Two times a day (BID) | ORAL | Status: DC
  Administered 2014-10-13 – 2014-10-14 (×3): 40 meq via ORAL
  Filled 2014-10-13 (×4): qty 2

## 2014-10-13 MED ORDER — CEFTRIAXONE SODIUM IN DEXTROSE 20 MG/ML IV SOLN
1.0000 g | INTRAVENOUS | Status: DC
  Administered 2014-10-13 – 2014-10-14 (×2): 1 g via INTRAVENOUS
  Filled 2014-10-13 (×3): qty 50

## 2014-10-13 MED ORDER — LORAZEPAM 1 MG PO TABS: 1.0000 mg | ORAL_TABLET | Freq: Four times a day (QID) | ORAL | Status: DC | PRN

## 2014-10-13 MED ORDER — FUROSEMIDE 10 MG/ML IJ SOLN
60.0000 mg | Freq: Once | INTRAMUSCULAR | Status: AC
  Administered 2014-10-13: 60 mg via INTRAVENOUS
  Filled 2014-10-13: qty 6

## 2014-10-13 MED ORDER — ADULT MULTIVITAMIN W/MINERALS CH
1.0000 | ORAL_TABLET | Freq: Every day | ORAL | Status: DC
  Administered 2014-10-13 – 2014-10-19 (×7): 1 via ORAL
  Filled 2014-10-13 (×7): qty 1

## 2014-10-13 MED ORDER — HEPARIN (PORCINE) IN NACL 100-0.45 UNIT/ML-% IJ SOLN: 16.0000 [IU]/kg/h | INTRAMUSCULAR | Status: DC

## 2014-10-13 MED ORDER — LOSARTAN POTASSIUM 50 MG PO TABS
50.0000 mg | ORAL_TABLET | Freq: Every day | ORAL | Status: DC
  Administered 2014-10-13 – 2014-10-14 (×2): 50 mg via ORAL
  Filled 2014-10-13 (×3): qty 1

## 2014-10-13 MED ORDER — LORAZEPAM 2 MG/ML IJ SOLN: 1.0000 mg | Freq: Four times a day (QID) | INTRAMUSCULAR | Status: DC | PRN

## 2014-10-13 MED ORDER — THIAMINE HCL 100 MG/ML IJ SOLN
100.0000 mg | Freq: Every day | INTRAMUSCULAR | Status: DC
  Filled 2014-10-13 (×7): qty 1

## 2014-10-13 MED ORDER — AZITHROMYCIN 250 MG PO TABS
250.0000 mg | ORAL_TABLET | Freq: Every day | ORAL | Status: AC
  Administered 2014-10-13 – 2014-10-16 (×4): 250 mg via ORAL
  Filled 2014-10-13 (×4): qty 1

## 2014-10-13 MED ORDER — VITAMIN B-1 100 MG PO TABS
100.0000 mg | ORAL_TABLET | Freq: Every day | ORAL | Status: DC
  Administered 2014-10-13 – 2014-10-19 (×7): 100 mg via ORAL
  Filled 2014-10-13 (×7): qty 1

## 2014-10-13 NOTE — Consult Note (Signed)
CARDIOLOGY CONSULT NOTE       Patient ID: Robert Booth MRN: [id number] DOB/AGE: 1970/12/18 44 y.o.  Admit date: 10/12/2014 Referring Physician:  Randolm Idol Primary Physician: No primary care provider on file. 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  Initially diagnosied in January [place].  Established with Dr Antoine Poche in May.  EF 20% with apical thrombus on coumadin. Cath with no CAD.  History of HTN. No ETOH since February but drank heavily for over 20 years.  No chest pain  Ran out of lasix and coumadin for 3 days and admitted with left sided flank pain possible right sided pneumonia and CHF exacerbation. Improving with diuretics and antibiotics.  No fever. Mild pleuritic pain ? Hemoptysis  No TIA or other bleeding issues. Mild LE edema Not clear why ARB and beta blocker held on admission   Lab Results  Component Value Date   TROPONINI 0.21* 10/13/2014    ROS All other systems reviewed and negative except as noted above  Past Medical History  Diagnosis Date  . Hypertension   . Alcoholism     Sober since Feb  . Anxiety     Family History  Problem Relation Age of Onset  . Hypertension Mother   . Hypertension Father   . Heart attack Father 56    died  . Hypertension Sister   . Heart attack Paternal Grandfather 50    died    History   Social History  . Marital Status: Single    Spouse Name: N/A  . Number of Children: 6  . Years of Education: N/A   Occupational History  . Not on file.   Social History Main Topics  . Smoking status: Former Smoker    Types: Cigarettes  . Smokeless tobacco: Former Neurosurgeon    Types: Chew     Comment: Was a rare smoker  .  Alcohol Use: No  . Drug Use: Yes  . Sexual Activity: Yes   Other Topics Concern  . Not on file   Social History Narrative   Lives with mom.      Past Surgical History  Procedure Laterality Date  . Hernia repair Right Inguinal  . Knee surgery Right 1998     . aspirin EC  81 mg Oral Daily  . azithromycin  250 mg Oral Daily  . cefTRIAXone (ROCEPHIN)  IV  1 g Intravenous Q24H  . folic acid  1 mg Oral Daily  . furosemide  60 mg Intravenous Once  . heparin  5,000 Units Subcutaneous 3 times per day  . multivitamin with minerals  1 tablet Oral Daily  . potassium chloride  40 mEq Oral BID  . sodium chloride  3 mL Intravenous Q12H  . sodium chloride  3 mL Intravenous Q12H  . thiamine  100 mg Oral Daily   Or  . thiamine  100 mg Intravenous Daily  . warfarin  10 mg Oral q1800  . Warfarin - Pharmacist Dosing Inpatient   Does not apply q1800      Physical Exam: Blood pressure 117/89, pulse 104, temperature 97.7 F (36.5 C), temperature source Oral, resp. rate 109, height 5\' 9"  (1.753 m), weight 88.769  kg (195 lb 11.2 oz), SpO2 94 %.   Affect appropriate Young black male  HEENT: normal Neck supple with no adenopathy JVP normal no bruits no thyromegaly Lungs clear with no wheezing and good diaphragmatic motion Heart:  S1/S2 no murmur, no rub, gallop or click PMI enlarged  Abdomen: benighn, BS positve, no tenderness, no AAA no bruit.  No HSM or HJR Distal pulses intact with no bruits Plus one bilateral  edema Neuro non-focal Skin warm and dry No muscular weakness   Labs:   Lab Results  Component Value Date   WBC 15.3* 10/13/2014   HGB 13.6 10/13/2014   HCT 37.5* 10/13/2014   MCV 87.0 10/13/2014   PLT 266 10/13/2014    Recent Labs Lab 10/12/14 1710 10/13/14 0245  NA 136 137  K 3.4* 3.0*  CL 101 99*  CO2 22 25  BUN 22* 20  CREATININE 1.56* 1.35*  CALCIUM 8.3* 8.2*  PROT 6.5  --   BILITOT 1.3*  --   ALKPHOS 85  --   ALT 167*  --   AST 129*  --   GLUCOSE  105* 126*   Lab Results  Component Value Date   TROPONINI 0.21* 10/13/2014    Lab Results  Component Value Date   CHOL 262* 07/21/2012   Lab Results  Component Value Date   HDL 57 07/21/2012   Lab Results  Component Value Date   LDLCALC 172* 07/21/2012   Lab Results  Component Value Date   TRIG 163* 07/21/2012   Lab Results  Component Value Date   CHOLHDL 4.6 07/21/2012   No results found for: LDLDIRECT    Radiology: X-ray Chest Pa And Lateral  10/13/2014   CLINICAL DATA:  Hemoptysis, community-acquired pneumonia, CHF, previous tobacco use.  EXAM: CHEST  2 VIEW  COMPARISON:  Chest x-ray and chest CT scan of October 12, 2014  FINDINGS: There is increased density in the right lower lobe with further obscuration of the hemidiaphragm. An air bronchogram is visible on the lateral film. The right middle and upper lobes are clear as is the left lung. The cardiac silhouette is mildly enlarged. The central pulmonary vascularity is prominent though stable. The bony thorax is unremarkable.  IMPRESSION: Increased opacity in the right lower lobe consistent with pneumonia. Mild cardiomegaly without pulmonary edema.   Electronically Signed   By: David  [place] M.D.   On: 10/13/2014 07:22   Dg Chest 2 View  10/12/2014   CLINICAL DATA:  44 year old male with 1 week history of productive cough and 1 day history of hemoptysis accompanied by right flank and upper back pain.  EXAM: CHEST  2 VIEW  COMPARISON:  None.  FINDINGS: Focal patchy airspace opacity in the anterior aspect of the right lower lobe concerning for bronchopneumonia. Otherwise, the lungs are clear. The cardiac and mediastinal contours are within normal limits. Osseous structures are intact and unremarkable.  IMPRESSION: Imaging findings are most consistent with right lower lobe pneumonia.   Electronically Signed   By: Malachy Moan M.D.   On: 10/12/2014 15:55   Ct Angio Chest Pe W/cm &/or Wo Cm  10/12/2014   CLINICAL DATA:   44 year old male with right-sided chest pain and shortness of breath  EXAM: CT ANGIOGRAPHY CHEST WITH CONTRAST  TECHNIQUE: Multidetector CT imaging of the chest was performed using the standard protocol during bolus administration of intravenous contrast. Multiplanar CT image reconstructions and MIPs were obtained to evaluate the vascular anatomy.  CONTRAST:  OMNIPAQUE IOHEXOL  350 MG/ML SOLN  COMPARISON:  Chest radiograph dated 10/12/2014  FINDINGS: There is a patchy area ground-glass and nodular opacities in the right lower lobe. There is non opacification of a distal subsegmental branch of the pulmonary artery extending to this consolidated portion of the right lower lobe. This may be artifactual or represent hypoperfusion secondary to hypoventilation. Distal branch pulmonary artery embolus is not excluded. Correlation with clinical exam and signs and symptoms for pneumonia recommended. No other pulmonary embolus identified. There is a small right pleural effusion. Small patchy areas of low-attenuation in the left upper and left lower lobe most compatible with air trapping. The central airways are patent.  The visualized thoracic aorta appear unremarkable. Mild cardiomegaly. No pericardial effusion. No hilar or mediastinal lymphadenopathy. The upper mediastinum appears unremarkable. The chest wall and the visualized upper abdomen is unremarkable. No acute fracture.  Review of the MIP images confirms the above findings.  IMPRESSION: Patchy right lower lobe consolidation most compatible with pneumonia and less likely representing infarct. Apparent non-opacification of the distal subsegmental pulmonary artery branch extending into the consolidated area may be artifactual or represent focal hypoperfusion. A distal branch pulmonary embolism is less likely, given no other pulmonary embolus identified, but not entirely excluded. Correlation with clinical exam and sign and symptoms of pneumonia recommended.    Electronically Signed   By: Elgie Collard M.D.   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  Continue iv diuresis resume ARB and beta blocker.  See notes Dr Antoine Poche regarding adding aldactone although compliance with pre existing meds already and issue  Pneumonia:  RLL WBC up on antibiotics follow serial CXR and adjust antibiotics  HTN:  Well controlled.  Continue current medications and low sodium Dash type diet.    Thrombus:  Sub Rx due to non compliance.  NOAC;s not aproved for apical thrombus  Cover with heparin until INR Rx Repeat echo with contrast pending     Signed: Charlton Haws 10/13/2014, 9:53 AM

## 2014-10-13 NOTE — Progress Notes (Signed)
Family Medicine Teaching Service Daily Progress Note Intern Pager: [phone]  Patient name: Robert Booth Medical record number: [id number] Date of birth: June 13, 1970 Age: 44 y.o. Gender: male  Primary Care Provider: No primary care provider on file. Consultants: Cardiology  Code Status: FULL  Chief Complaint: SOB, cough, hemoptysis, pleuritic chest pain  Assessment and Plan: Robert Booth is a 44 y.o. male presenting with cough, SOB, hemoptysis and pleuritic chest pain. PMH is significant for HTN, dilated cardiomyopathy (NICM, reduced EF), alcoholism, and anxiety.   DOE / Hemoptysis, presumed RLL CAP Cough beginning 10 days ago with increasing SOB / DOE and hemoptysis observed on admission. Also reports worsening pleuritic R flank, back pain. Most likely CAP given imaging (CXR then confirmed on CTA with RLL consolidation likely pneumonia less likely infarct) despite lack of systemic symptoms, afebrile, no hypoxia. Received one dose ctx and Duoneb in ED. Denies fevers. WBC 12.4 on admission. Meets SIRS criteria and likely source, considered Sepsis (not severe, BP improved). Differential includes initial high concern for PE (Well's score 4, active LV thrombus and off Coumadin x 1 week INR 1.2, however Chest CTA w/o evidence acute PE, can't r/o distal PE), ?malignancy (h/o tobacco). Now with some fluid overload on exam, concern for component of possible acute on CHF exac.  - S/p CTX, azithromycin in ED - Continue CTX 1g IV q 24 hr (7/13>>) and Azithro switch to PO 250mg  qd x 4 days (7/13>7/17) - Repeat CXR shows increased opacity in RLL consistent with PNA - BMP, CBC, Mag, INR - Lactic Acid trending down 2.54 > 2.1  - BCx pending (after abx) - Norco for chest pain - UDS in progress  - add Tessalon, chloroseptic spray for cough - Consult pharmacy for Coumadin management - decision to hold Coumadin dose last night (07/13), despite sub-therapeutic INR 1.2 with acute hemoptysis. Re-evaluate this AM  and discuss with Cardiology. - Faxed release papers to [facility] in [place] where patient received initial care in 04/2014  Presumed Acute on Chronic Systolic CHF / Dilated NICM Dx in 04/2014, prior ECHO reduced LVEF 20% (per Cards, no record of ECHO in [place]), s/p cardiac cath in 2016 showed normal coronary arteries. Dx LV thrombus in 03/16. Missed three doses of Lasix last week, also stopped taking coumadin last week. Bilateral pitting edema on exam today. Lungs with some crackles but overall poor air movement. Positive orthopnea. Additionally, no active chest pressure/tightness or typical anginal symptoms, 43 yr w/o CAD, ACS is highly unlikely, suspect troponin related to demand ischemia. - Elevated BNP 879 but may be consistently elevated due to dilated cardiomyopathy (no comparison lab) - Repeat troponins remain 0.21 as on admission - EKG - sinus tach with T wave abnormality; repeat EKG same findings as initial - Echo pending - Lasix 60 mg IV x1 - monitor BP - Strict I/O, Daily weights - Formal Cardiology consult in AM. Cardiology on-call notified tonight, recommended continue to trend troponins however unlikely to be concern for MI unless significantly more increased and new CP, hold coumadin due to hemoptysis, continue Lasix.  Vision change: reports "un-focused but not blurry" vision for past two days. Reports closing either eye improves vision.  - Continue to monitor for new or improving vision changes  AKI: Elevated SCr 1.56 (0.95 in 2014)  - IVF - Continue to monitor - Hold nephrotoxic meds (ARB) - Cr improved to 1.35 from 1.56 yesterday   Hypokalemia: K 3.0 today  -Repleted with KDur 40 mg IV BID  HTN On  admit, low BP with SBP 90-100s. Gradually improving. Concern with low BP and significant amount of pain initially. BP improved with initial IVF in ED, continues to improve. No evidence hypotension, concern for sepsis with CAP but no evidence severe sepsis. - Hold home losartan,  metoprolol  Anxiety: not currently medically treated  Alcohol Abuse - CIWA initially, follow scores if stable can DC 1-2 days, unless clinical evidence withdrawal  FEN/GI: heart healthy diet  Prophylaxis: Sub Q Heparin, hold anticoagulants per cards   Disposition: home pending medical improvement   Subjective:   Patient reports that he is coughing much less this morning and has not experienced any more hemoptysis. He says his R-sided pain is unchanged and is still present primarily in his R flank up to his R shoulder/neck. His LLQ abdominal pain is now gone. He feels less bloated and generally better after receiving Lasix. He is breathing more easily, and can walk without getting out of breath quickly.   Objective: Temp:  [97.5 F (36.4 C)-97.7 F (36.5 C)] 97.7 F (36.5 C) (07/14 0543) Pulse Rate:  [46-115] 104 (07/14 0543) Resp:  [12-109] 109 (07/14 0124) BP: (95-118)/(53-89) 117/89 mmHg (07/14 0543) SpO2:  [76 %-100 %] 94 % (07/14 0543) Weight:  [194 lb 14.4 oz (88.406 kg)-195 lb 11.2 oz (88.769 kg)] 195 lb 11.2 oz (88.769 kg) (07/14 0543) Physical Exam:  General: male in NAD sitting in chair at bedside Eyes: PERRLA ENTM: MMM, no blood in oropharynx or nares Cardiovascular: RRR, no murmurs appreciated Respiratory: Slightly improved air flow, no rhonchi/crackles/wheezing Abdomen: non-tender, soft, non-distended, +BS MSK: R flank tenderness, upper R-back +TTP Ext: +1 pitting edema below knees bilaterally, calves non-tender Skin: no bruises or rashes noted Neuro: 5/5 strength LE bilaterally, no focal deficits, A&Ox3 Psych: appropriate mood and affect  Laboratory:  Recent Labs Lab 10/12/14 1710 10/13/14 0245  WBC 12.4* 15.3*  HGB 14.3 13.6  HCT 39.9 37.5*  PLT 288 266    Recent Labs Lab 10/12/14 1710 10/13/14 0245  NA 136 137  K 3.4* 3.0*  CL 101 99*  CO2 22 25  BUN 22* 20  CREATININE 1.56* 1.35*  CALCIUM 8.3* 8.2*  PROT 6.5  --   BILITOT 1.3*  --    ALKPHOS 85  --   ALT 167*  --   AST 129*  --   GLUCOSE 105* 126*    Imaging/Diagnostic Tests: Dg Chest 2 View  10/12/2014   CLINICAL DATA:  44 year old male with 1 week history of productive cough and 1 day history of hemoptysis accompanied by right flank and upper back pain.  EXAM: CHEST  2 VIEW  COMPARISON:  None.  FINDINGS: Focal patchy airspace opacity in the anterior aspect of the right lower lobe concerning for bronchopneumonia. Otherwise, the lungs are clear. The cardiac and mediastinal contours are within normal limits. Osseous structures are intact and unremarkable.  IMPRESSION: Imaging findings are most consistent with right lower lobe pneumonia.   Electronically Signed   By: Malachy Moan M.D.   On: 10/12/2014 15:55   Ct Angio Chest Pe W/cm &/or Wo Cm  10/12/2014   CLINICAL DATA:  44 year old male with right-sided chest pain and shortness of breath  EXAM: CT ANGIOGRAPHY CHEST WITH CONTRAST  TECHNIQUE: Multidetector CT imaging of the chest was performed using the standard protocol during bolus administration of intravenous contrast. Multiplanar CT image reconstructions and MIPs were obtained to evaluate the vascular anatomy.  CONTRAST:  OMNIPAQUE IOHEXOL 350 MG/ML SOLN  COMPARISON:  Chest radiograph dated 10/12/2014  FINDINGS: There is a patchy area ground-glass and nodular opacities in the right lower lobe. There is non opacification of a distal subsegmental branch of the pulmonary artery extending to this consolidated portion of the right lower lobe. This may be artifactual or represent hypoperfusion secondary to hypoventilation. Distal branch pulmonary artery embolus is not excluded. Correlation with clinical exam and signs and symptoms for pneumonia recommended. No other pulmonary embolus identified. There is a small right pleural effusion. Small patchy areas of low-attenuation in the left upper and left lower lobe most compatible with air trapping. The central airways are  patent.  The visualized thoracic aorta appear unremarkable. Mild cardiomegaly. No pericardial effusion. No hilar or mediastinal lymphadenopathy. The upper mediastinum appears unremarkable. The chest wall and the visualized upper abdomen is unremarkable. No acute fracture.  Review of the MIP images confirms the above findings.  IMPRESSION: Patchy right lower lobe consolidation most compatible with pneumonia and less likely representing infarct. Apparent non-opacification of the distal subsegmental pulmonary artery branch extending into the consolidated area may be artifactual or represent focal hypoperfusion. A distal branch pulmonary embolism is less likely, given no other pulmonary embolus identified, but not entirely excluded. Correlation with clinical exam and sign and symptoms of pneumonia recommended.   Electronically Signed   By: Elgie Collard M.D.   On: 10/12/2014 18:27    [name], MD 10/13/2014, 6:13 AM PGY-1, [facility] Family Medicine FPTS Intern pager: [phone], text pages welcome

## 2014-10-13 NOTE — Discharge Summary (Signed)
Family Medicine Teaching [facility] Discharge Summary  Patient name: Robert Booth Medical record number: [id number] Date of birth: 07/03/1970 Age: 44 y.o. Gender: male Date of Admission: 10/12/2014  Date of Discharge: 10/18/2014 Admitting Physician: Uvaldo Rising, MD  Primary Care Provider: No primary care provider on file. Consultants: Cardiology   Indication for Hospitalization: SOB, cough, chest pain  Discharge Diagnoses/Problem List:  Patient Active Problem List   Diagnosis Date Noted  . Pneumonia 10/12/2014  . CAP (community acquired pneumonia) 10/12/2014  . CHF (congestive heart failure)   . Congestive dilated cardiomyopathy   . Essential hypertension   . History of alcohol abuse   . Hemoptysis   . LV (left ventricular) mural thrombus   . Nonischemic cardiomyopathy 08/18/2014  . HTN (hypertension) 08/18/2014     Disposition: Home  Discharge Condition: Stable  Discharge Exam:  General: male sitting in bedside chair in NAD Cardiovascular: RRR, no murmurs appreciated Respiratory: CTAB, no wheezes/crackles Abdomen: soft, non-tender, non-distended, +BS MSK: no pain on palpation of L flank or shoulder Extremities: 1+ pitting edema LE bilaterally  Brief Hospital Course:  Mr. Noor has a history of CHF diagnosed six months ago (04/2014), as well as two episodes of PNA in the past six months. He presented with 10 days of cough, increasing SOB and chest pain. Shortly before admission he also began experiencing vision change and hemoptysis. Imaging at the ED were suggestive of pneumonia: patchy RLL consolidation read as PNA, CXR consistent with RLL PNA per radiology. Azithromycin was started. He was also found to have AKI on admission, with Cr 1.56.   He was diagnosed with LV thrombus four months ago (05/2014). Since then he has been taking Lasix and coumadin. He missed three doses of coumadin last week, and reports that he started coughing after that. He stopped taking  his coumadin at that time, because he was worried one of his organs would rupture if he remained on coumadin when coughing so much. Cardiology was consulted the night of his admission, and recommended holding coumadin and continuing Lasix.   After follow-up on day two of admission, cardiology recommended echo. Repeat CXR showed increased opacity in RLL consistent with PNA, and echo showed chronic partially calcified thrombus and EF 20%. These findings were consistent with the history the patient told us from his March 2016 hospitalization. We continued to diurese the patient, and his symptoms gradually improved. Cardiology recommended stopping patient's Lasix for a few days then resuming home maintenance. He developed orthopnea and increased LE edema during this time, and was thus kept for observation one additional day after resuming Lasix. He required another 60 mg of IV Lasix the following morning for persistent symptoms, and was monitored for one additional night. His orthopnea and edema were improved in the morning, and he was discharged.    Issues for Follow Up:  1. Patient has history of non-compliance with Lasix and coumadin. Stressed the importance of taking his medications daily rather than waiting until symptoms appear then "doubling up" on his meds.   2. Discussed the importance of following-up regularly with Dr. Antoine Poche and having his INR measured. He said he has an appointment scheduled two days from now.  3. Given patient's INR of 3.77 on day of discharge, pharmacy suggested holding 10 mg coumadin dose for day of discharge and the next day. They recommended resuming medication two days after discharge, but at dose of 7.5 mg.   Significant Procedures: None  Significant Labs and Imaging:  CTA (07/13) -  patchy RLL consolidation CXR (07/13) - focal patchy airspace opacity in the anterior aspect of the RLL concerning for bronchopneumonia BNP (07/13) - 879 WBC (07/13) - 12.4 INR - 1.3 (on  arrival) >> 2.13 (at discharge)>>3.77 Echo (07/14)  - large chronically-appearing partially calcified thrombus, EF 20%    Recent Labs Lab 10/15/14 0308 10/16/14 0407 10/17/14 0438  WBC 16.8* 13.6* 14.6*  HGB 13.7 13.2 13.6  HCT 37.7* 36.8* 38.1*  PLT 366 416* 444*    Recent Labs Lab 10/13/14 0245 10/13/14 0930 10/14/14 0315 10/15/14 0308 10/16/14 0407 10/17/14 0438  NA 137  --  134* 132* 133* 135  K 3.0*  --  4.1 3.6 3.6 4.6  CL 99*  --  97* 98* 97* 98*  CO2 25  --  27 21* 25 23  GLUCOSE 126*  --  96 105* 96 97  BUN 20  --  23* 37* 33* 33*  CREATININE 1.35*  --  1.72* 1.99* 1.42* 1.44*  CALCIUM 8.2*  --  8.6* 8.6* 8.4* 8.8*  MG  --  1.8  --   --   --   --     Results/Tests Pending at Time of Discharge: None  Discharge Medications:    Medication List    TAKE these medications        aspirin 81 MG EC tablet  Take 1 tablet (81 mg total) by mouth daily. Swallow whole.     furosemide 40 MG tablet  Commonly known as:  LASIX  Take 1 tablet (40 mg total) by mouth daily.     losartan 50 MG tablet  Commonly known as:  COZAAR  Take 1 tablet (50 mg total) by mouth daily.     metoprolol succinate 100 MG 24 hr tablet  Commonly known as:  TOPROL-XL  Take 1 tablet (100 mg total) by mouth daily. Take with or immediately following a meal.     warfarin 7.5 MG tablet  Commonly known as:  COUMADIN  Take 1 tablet (7.5 mg total) by mouth daily.  Start taking on:  10/21/2014        Discharge Instructions: Please refer to Patient Instructions section of EMR for full details.  Patient was counseled important signs and symptoms that should prompt return to medical care, changes in medications, dietary instructions, activity restrictions, and follow up appointments.   Follow-Up Appointments: Follow-up Information    Follow up with Rollene Rotunda, MD.   Specialty:  Cardiology   Why:  office will call you   Contact information:   [address] [place]  [address] [phone]       [name], MD 10/19/2014, 7:43 PM PGY-1, [facility] Family [facility]

## 2014-10-13 NOTE — Progress Notes (Signed)
ANTICOAGULATION CONSULT NOTE - Follow Up Consult  Pharmacy Consult for warfarin & heparin Indication: LV thrombus Labs:  Recent Labs  10/12/14 1710 10/12/14 2156 10/13/14 0245 10/13/14 0930 10/13/14 1830  HGB 14.3  --  13.6  --   --   HCT 39.9  --  37.5*  --   --   PLT 288  --  266  --   --   LABPROT 15.5*  --   --  16.4*  --   INR 1.21  --   --  1.31  --   HEPARINUNFRC  --   --   --   --  0.26*  CREATININE 1.56*  --  1.35*  --   --   TROPONINI  --  0.21* 0.21* 0.16*  --     Estimated Creatinine Clearance: 76.9 mL/min (by C-G formula based on Cr of 1.35).  Assessment: 43 yom admitted 10/12/2014 with cough x 1wk, SOB, increased abd swelling & R-sided pleuritic CP. Also noted to have hemoptysis w/ coughing. On warfarin started in March, stopped 1 wk ago when sx started.  PMH hypertension, nonischemic dilated cardiomyopathy with reduced EF, alcoholism, anxiety   Anticoagulation/Heme LV thrombus Hgb 13.6, plt WNL; INR 1.2 subtherapeutic d/t nonadherence; heparin level not at goal on 1400 units/hr Warfarin started in March, held 7/13 due to hemoptysis, no current active bleeding Given indication of LV thrombus, will need heparin bridge  Was on heparin ppx, no heparin bolus   PTA dose: 10mg  daily  Goal of Therapy:  INR 2-3  Heparin level 0.3-0.7 Monitor platelets by anticoagulation protocol: Yes   Plan:  Increase heparin infusion at 1550 units/hr Check anti-Xa level in 6 hours > with am labs. Continue to monitor H&H and platelets  Thank you for allowing pharmacy to be a part of this patients care team.  Lovenia Kim Pharm.D., BCPS, AQ-Cardiology Clinical Pharmacist 10/13/2014 7:42 PM Pager: [phone] Phone: [phone]

## 2014-10-13 NOTE — Progress Notes (Signed)
ANTICOAGULATION CONSULT NOTE - Follow Up Consult  Pharmacy Consult for warfarin & heparin Indication: LV thrombus  Allergies  Allergen Reactions  . Celexa [Citalopram Hydrobromide] Other (See Comments)    DISORIENTED  . Lisinopril Cough    Patient Measurements: Height: 5\' 9"  (175.3 cm) Weight: 195 lb 11.2 oz (88.769 kg) IBW/kg (Calculated) : 70.7  Labs:  Recent Labs  10/12/14 1710 10/12/14 2156 10/13/14 0245 10/13/14 0930  HGB 14.3  --  13.6  --   HCT 39.9  --  37.5*  --   PLT 288  --  266  --   LABPROT 15.5*  --   --  16.4*  INR 1.21  --   --  1.31  CREATININE 1.56*  --  1.35*  --   TROPONINI  --  0.21* 0.21* 0.16*    Estimated Creatinine Clearance: 76.9 mL/min (by C-G formula based on Cr of 1.35).   Medications:  Prescriptions prior to admission  Medication Sig Dispense Refill Last Dose  . furosemide (LASIX) 40 MG tablet Take 1 tablet (40 mg total) by mouth daily. 30 tablet 2 10/12/2014 at Unknown time  . losartan (COZAAR) 50 MG tablet Take 1 tablet (50 mg total) by mouth daily. 90 tablet 3 10/12/2014 at Unknown time  . metoprolol succinate (TOPROL-XL) 50 MG 24 hr tablet Take 1 tablet (50 mg total) by mouth daily. Take with or immediately following a meal. 90 tablet 3 10/12/2014 at Unknown time  . warfarin (COUMADIN) 10 MG tablet Take 1 tablet (10 mg total) by mouth daily. (Patient taking differently: Take 10 mg by mouth daily at 6 PM. ) 30 tablet 1 10/11/2014 at Unknown time  . aspirin 81 MG EC tablet Take 1 tablet (81 mg total) by mouth daily. Swallow whole. 30 tablet 12 10/12/2014 at Unknown time    Assessment: 43 yom admitted 10/12/2014 with cough x 1wk, SOB, increased abd swelling & R-sided pleuritic CP. Also noted to have hemoptysis w/ coughing. On warfarin started in March, stopped 1 wk ago when sx started.  PMH hypertension, nonischemic dilated cardiomyopathy with reduced EF, alcoholism, anxiety   Anticoagulation/Heme LV thrombus Hgb 13.6, plt WNL  INR 1.2  subtherapeutic d/t nonadherence Warfarin started in March, stopped 1 wk ago  Warfarin held 7/13 due to hemoptysis, no current active bleeding Given indication of LV thrombus, will need heparin bridge  Was on heparin ppx, no heparin bolus  PTA dose: 10mg  daily  Goal of Therapy:  INR 2-3  Heparin level 0.3-0.7 Monitor platelets by anticoagulation protocol: Yes   Plan:  Warfarin 10 mg @ 1800 Start heparin infusion at 1400 units/hr Check anti-Xa level in 6 hours and daily while on heparin Continue to monitor H&H and platelets, INR  Floria Raveling 10/13/2014,10:49 AM

## 2014-10-13 NOTE — Progress Notes (Signed)
  Echocardiogram 2D Echocardiogram has been performed.  Robert Booth 10/13/2014, 1:01 PM

## 2014-10-14 ENCOUNTER — Ambulatory Visit: Payer: Self-pay | Admitting: Pharmacist Clinician (PhC)/ Clinical Pharmacy Specialist

## 2014-10-14 DIAGNOSIS — I2109 ST elevation (STEMI) myocardial infarction involving other coronary artery of anterior wall: Secondary | ICD-10-CM

## 2014-10-14 DIAGNOSIS — I10 Essential (primary) hypertension: Secondary | ICD-10-CM

## 2014-10-14 DIAGNOSIS — I5023 Acute on chronic systolic (congestive) heart failure: Secondary | ICD-10-CM

## 2014-10-14 LAB — BASIC METABOLIC PANEL
ANION GAP: 10 (ref 5–15)
BUN: 23 mg/dL — AB (ref 6–20)
CO2: 27 mmol/L (ref 22–32)
CREATININE: 1.72 mg/dL — AB (ref 0.61–1.24)
Calcium: 8.6 mg/dL — ABNORMAL LOW (ref 8.9–10.3)
Chloride: 97 mmol/L — ABNORMAL LOW (ref 101–111)
GFR calc Af Amer: 54 mL/min — ABNORMAL LOW (ref >60)
GFR calc non Af Amer: 47 mL/min — ABNORMAL LOW (ref >60)
GLUCOSE: 96 mg/dL (ref 65–99)
Potassium: 4.1 mmol/L (ref 3.5–5.1)
Sodium: 134 mmol/L — ABNORMAL LOW (ref 135–145)

## 2014-10-14 LAB — CBC
HCT: 38.3 % — ABNORMAL LOW (ref 39.0–52.0)
Hemoglobin: 13.5 g/dL (ref 13.0–17.0)
MCH: 30.9 pg (ref 26.0–34.0)
MCHC: 35.2 g/dL (ref 30.0–36.0)
MCV: 87.6 fL (ref 78.0–100.0)
PLATELETS: 318 10*3/uL (ref 150–400)
RBC: 4.37 MIL/uL (ref 4.22–5.81)
RDW: 15.1 % (ref 11.5–15.5)
WBC: 20.3 10*3/uL — ABNORMAL HIGH (ref 4.0–10.5)

## 2014-10-14 LAB — PROTIME-INR
INR: 1.42 (ref 0.00–1.49)
PROTHROMBIN TIME: 17.4 s — AB (ref 11.6–15.2)

## 2014-10-14 LAB — HEPARIN LEVEL (UNFRACTIONATED)
Heparin Unfractionated: <0.1 IU/mL — ABNORMAL LOW (ref 0.30–0.70)
Heparin Unfractionated: 0.37 IU/mL (ref 0.30–0.70)

## 2014-10-14 MED ORDER — WARFARIN SODIUM 10 MG PO TABS
10.0000 mg | ORAL_TABLET | Freq: Once | ORAL | Status: AC
  Administered 2014-10-14: 10 mg via ORAL
  Filled 2014-10-14: qty 1

## 2014-10-14 NOTE — Progress Notes (Addendum)
ANTICOAGULATION CONSULT NOTE - Follow Up Consult  Pharmacy Consult for Heparin and Coumadin Indication: LV thrombus  Allergies  Allergen Reactions  . Celexa [Citalopram Hydrobromide] Other (See Comments)    DISORIENTED  . Lisinopril Cough    Patient Measurements: Height: 5\' 9"  (175.3 cm) Weight: 197 lb 1.6 oz (89.404 kg) IBW/kg (Calculated) : 70.7  Vital Signs: Temp: 97.3 F (36.3 C) (07/15 1035) Temp Source: Oral (07/15 1035) BP: 122/76 mmHg (07/15 1035) Pulse Rate: 113 (07/15 1035)  Labs:  Recent Labs  10/12/14 1710 10/12/14 2156 10/13/14 0245 10/13/14 0930 10/13/14 1830 10/14/14 0315 10/14/14 0900  HGB 14.3  --  13.6  --   --  13.5  --   HCT 39.9  --  37.5*  --   --  38.3*  --   PLT 288  --  266  --   --  318  --   LABPROT 15.5*  --   --  16.4*  --  17.4*  --   INR 1.21  --   --  1.31  --  1.42  --   HEPARINUNFRC  --   --   --   --  0.26*  --  <0.10*  CREATININE 1.56*  --  1.35*  --   --  1.72*  --   TROPONINI  --  0.21* 0.21* 0.16*  --   --   --     Estimated Creatinine Clearance: 60.6 mL/min (by C-G formula based on Cr of 1.72).   Medications:  Heparin @ 1550 units/hr  Assessment: 43yom on coumadin pta for LV thrombus, admitted with hemoptysis. INR 1.2 on admission 2/2 noncompliance. Coumadin ordered to continue however dose held 7/13 due to hemoptysis. Heparin bridge added 7/14. Initial heparin level was slightly below goal, rate increased, and follow up level is now undetectable. Spoke to patient who told me the infusion was disconnected for some time this morning and then resumed right around shift change. This is likely why the heparin is undetectable. INR remains below goal but is trending up despite no coumadin given 7/13 or 7/14. Continues to have hemoptysis.  Goal of Therapy:  INR 2-3 Heparin level 0.3-0.7 units/ml Monitor platelets by anticoagulation protocol: Yes   Plan:  1) Continue heparin at 1550 units/hr and recheck level this  afternoon 2) Coumadin 10mg  x 1 3) INR in AM  Fredrik Rigger 10/14/2014,11:42 AM   Addendum: Recheck heparin level = 0.37, therapeutic.   - Continue heparin infusion 1550 units/hr - f/u AM labs.  Bayard Hugger, PharmD, BCPS  Clinical Pharmacist  Pager: [phone]

## 2014-10-14 NOTE — Progress Notes (Signed)
Utilization review completed. Mckaylee Dimalanta, RN, BSN. 

## 2014-10-14 NOTE — Progress Notes (Signed)
Heart Failure Navigator Consult Note  Presentation: Robert Booth is a 44 y.o. 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 Denies alcohol use since Feb 2016 but reports 20 year history of heavy use. H/o normal LCH in [place] but 2D echo showed EF of 20% and LV thrombus. Was placed on coumadin. Now followed by Dr. Antoine Poche. Ran out of lasix and coumadin for 3 days and admitted 10/12/14 with left sided flank pain, possible right sided pneumonia and CHF exacerbation. 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   Past Medical History  Diagnosis Date  . Hypertension   . Alcoholism     Sober since Feb  . Anxiety     History   Social History  . Marital Status: Single    Spouse Name: N/A  . Number of Children: 6  . Years of Education: N/A   Social History Main Topics  . Smoking status: Former Smoker    Types: Cigarettes  . Smokeless tobacco: Former Neurosurgeon    Types: Chew     Comment: Was a rare smoker  . Alcohol Use: No  . Drug Use: Yes  . Sexual Activity: Yes   Other Topics Concern  . None   Social History Narrative   Lives with mom.      ECHO:Study Conclusions--10/13/14  - Left ventricle: Large chronic appearing partially calcified mural apical and apical lateral thrombus. The cavity size was severely dilated. Wall thickness was normal. The estimated ejection fraction was 20%. - Left atrium: The atrium was moderately dilated. - Atrial septum: No defect or patent foramen ovale was identified.  Transthoracic echocardiography. M-mode, complete 2D, spectral Doppler, and color Doppler. Birthdate: Patient birthdate: [birth year]. Age: Patient is 44 yr old. Sex: Gender: male. BMI: 28.8 kg/m^2. Blood pressure:   117/89 Patient status: Inpatient. Study date: Study date: 10/13/2014. Study time: 12:01 PM. Location: Bedside.  BNP    Component Value Date/Time   BNP  879.0* 10/12/2014 1710    ProBNP No results found for: PROBNP   Education Assessment and Provision:  Detailed education and instructions provided on heart failure disease management including the following:  Signs and symptoms of Heart Failure When to call the physician Importance of daily weights Low sodium diet Fluid restriction Medication management Anticipated future follow-up appointments  Patient education given on each of the above topics.  Patient acknowledges understanding and acceptance of all instructions.  I spoke briefly with Robert Booth regarding his HF and HF recommnedations.  He says that he has a scale at home and weighs daily.  We discussed a low sodium diet and high sodium foods to avoid.  He does say that he currently has no insurance and recently ran out of medications.  I will discuss with care management as he may need medication assistance.  We ended the education secondary to his complaints of right side flank pain and he called for pain medication.  I will see him again if he remains in the hospital on Monday.  He has seen Dr. Antoine Poche as an outpatient and will follow at [facility].   Education Materials:  "Living Better With Heart Failure" Booklet, Daily Weight Tracker Tool   High Risk Criteria for Readmission and/or Poor Patient Outcomes:   EF <30%- yes 20%   2 or more admissions in 6 months- yes  Difficult social situation- Yes- no current insurance noted  Demonstrates medication noncompliance- yes--unable to afford and  recently ran out   Barriers of Care:  Knowledge and compliance related to finances  Discharge Planning:   Plans to discharge to home with his mother.  He may need medication assistance at discharge.

## 2014-10-14 NOTE — Progress Notes (Signed)
Family Medicine Teaching Service Daily Progress Note Intern Pager: [phone]  Patient name: Robert Booth Medical record number: [id number] Date of birth: 01/26/1971 Age: 44 y.o. Gender: male  Primary Care Provider: No primary care provider on file. Consultants: Cardiology  Code Status: FULL  Chief Complaint: SOB, cough, hemoptysis, pleuritic chest pain  Assessment and Plan: [name] is a 44 y.o. male presenting with cough, SOB, hemoptysis and pleuritic chest pain. PMH is significant for HTN, dilated cardiomyopathy (NICM, reduced EF), alcoholism, and anxiety.   DOE / Hemoptysis, presumed RLL CAP Cough beginning 10 days ago with increasing SOB / DOE and hemoptysis observed on admission. Also reports worsening pleuritic R flank, back pain. Most likely CAP given imaging (CXR then confirmed on CTA with RLL consolidation likely pneumonia less likely infarct) despite lack of systemic symptoms, afebrile, no hypoxia. Received one dose ctx and Duoneb in ED. Denies fevers. WBC 12.4 on admission. Meets SIRS criteria and likely source, considered Sepsis (not severe, BP improved). Differential includes initial high concern for PE (Well's score 4, active LV thrombus and off Coumadin x 1 week INR 1.2, however Chest CTA w/o evidence acute PE, can't r/o distal PE), ?malignancy (h/o tobacco). Now with some fluid overload on exam, concern for component of possible acute on CHF exac.  - S/p CTX, azithromycin in ED - Continue CTX 1g IV q 24 hr (7/13>>) and Azithro switch to PO 250mg  qd x 4 days (7/13>7/17) - Repeat CXR shows increased opacity in RLL consistent with PNA - Lactic Acid trending down 2.54 > 2.1 >1.6 - BCx - no growth x 24 hrs  - Norco for chest pain - UDS + for opiates - Tessalon, chloroseptic spray for cough - Faxed release papers to [facility] in [place] where patient received initial care in 04/2014 - Monitor INR, can d/c patient when INR therapeutic  - Heparin bridge started yesterday    Presumed Acute on Chronic Systolic CHF / Dilated NICM Dx in 04/2014, prior ECHO reduced LVEF 20% (per Cards, no record of ECHO in [place]), s/p cardiac cath in 2016 showed normal coronary arteries. Dx LV thrombus in 03/16. Missed three doses of Lasix last week, also stopped taking coumadin last week. Positive orthopnea. Additionally, no active chest pressure/tightness or typical anginal symptoms, 43 yr w/o CAD, ACS is highly unlikely, suspect troponin related to demand ischemia. - Elevated BNP 879 but may be consistently elevated due to dilated cardiomyopathy (no comparison lab) - Repeat troponins remain 0.21 as on admission - EKG - sinus tach with T wave abnormality; repeat EKG same findings as initial - Lasix 60 mg IV x1 - monitor BP - Strict I/O, Daily weights - Per cardiology, no indication for cath. Continue IV diuresis - Echo (07/14) - large chronic-appearing partially calcified thrombus, EF 20% - Patient reports he sees Dr. Shirlean Schlein for cards f/u in [place] and had appointment today to check INR  Vision change: reports "un-focused but not blurry" vision for past two days. Reports closing either eye improves vision.  - Continue to monitor for new or improving vision changes  AKI: Elevated SCr 1.56 (0.95 in 2014)  - IVF - Continue to monitor - Per cards, resume ARB - Cr 1.56>>1.35>>1.72  Hypokalemia:  -K improved to 4.1 today  -Monitor   HTN On admit, low BP with SBP 90-100s. Gradually improving. Concern with low BP and significant amount of pain initially. BP improved with initial IVF in ED, continues to improve. No evidence hypotension, concern for sepsis with CAP but  no evidence severe sepsis. - Hold home losartan, metoprolol  Anxiety: not currently medically treated  Alcohol Abuse - CIWA  FEN/GI: heart healthy diet  Prophylaxis: warfarin , begin heparin infusion at 1400 units/hr   Disposition: home pending therapeutic INR  Subjective:  Robert Booth reports  that he is still coughing up blood this morning. He says it happens less frequently however. He still endorses R flank pain primarily when coughing. He denies pain in this area upon palpation. He denies SOB or chest pain.  We discussed following up with a cardiologist/PCP in [place], especially to monitor his INR levels. He said he follows at Dr. Pollyann Kennedy office and had an appointment scheduled there today to have his INR checked.    Objective: Temp:  [97.3 F (36.3 C)-98.9 F (37.2 C)] 97.3 F (36.3 C) (07/15 1035) Pulse Rate:  [101-114] 113 (07/15 1035) Resp:  [18-20] 18 (07/15 1035) BP: (109-126)/(76-93) 122/76 mmHg (07/15 1035) SpO2:  [95 %-100 %] 95 % (07/15 1035) Weight:  [197 lb 1.6 oz (89.404 kg)] 197 lb 1.6 oz (89.404 kg) (07/15 0437) Physical Exam: General: well-appearing male sitting up in bed  ENTM: MMM, no blood in oropharynx or nares Cardiovascular: RRR, no murmurs appreciated Respiratory: Slightly improved air flow, no rhonchi/crackles/wheezing Abdomen: non-tender, soft, non-distended, +BS MSK: no R flank tenderness Neuro: no focal deficits, A&Ox3 Psych: appropriate mood and affect  Laboratory:  Recent Labs Lab 10/12/14 1710 10/13/14 0245 10/14/14 0315  WBC 12.4* 15.3* 20.3*  HGB 14.3 13.6 13.5  HCT 39.9 37.5* 38.3*  PLT 288 266 318    Recent Labs Lab 10/12/14 1710 10/13/14 0245 10/14/14 0315  NA 136 137 134*  K 3.4* 3.0* 4.1  CL 101 99* 97*  CO2 BUN 22* 20 23*  CREATININE 1.56* 1.35* 1.72*  CALCIUM 8.3* 8.2* 8.6*  PROT 6.5  --   --   BILITOT 1.3*  --   --   ALKPHOS 85  --   --   ALT 167*  --   --   AST 129*  --   --   GLUCOSE 105* 126* 96    Imaging/Diagnostic Tests: X-ray Chest Pa And Lateral  10/13/2014   CLINICAL DATA:  Hemoptysis, community-acquired pneumonia, CHF, previous tobacco use.  EXAM: CHEST  2 VIEW  COMPARISON:  Chest x-ray and chest CT scan of October 12, 2014  FINDINGS: There is increased density in the right lower  lobe with further obscuration of the hemidiaphragm. An air bronchogram is visible on the lateral film. The right middle and upper lobes are clear as is the left lung. The cardiac silhouette is mildly enlarged. The central pulmonary vascularity is prominent though stable. The bony thorax is unremarkable.  IMPRESSION: Increased opacity in the right lower lobe consistent with pneumonia. Mild cardiomegaly without pulmonary edema.   Electronically Signed   By: David  [place] M.D.   On: 10/13/2014 07:22   Dg Chest 2 View  10/12/2014   CLINICAL DATA:  44 year old male with 1 week history of productive cough and 1 day history of hemoptysis accompanied by right flank and upper back pain.  EXAM: CHEST  2 VIEW  COMPARISON:  None.  FINDINGS: Focal patchy airspace opacity in the anterior aspect of the right lower lobe concerning for bronchopneumonia. Otherwise, the lungs are clear. The cardiac and mediastinal contours are within normal limits. Osseous structures are intact and unremarkable.  IMPRESSION: Imaging findings are most consistent with right lower lobe pneumonia.   Electronically  Signed   By: Malachy Moan M.D.   On: 10/12/2014 15:55   Ct Angio Chest Pe W/cm &/or Wo Cm  10/12/2014   CLINICAL DATA:  44 year old male with right-sided chest pain and shortness of breath  EXAM: CT ANGIOGRAPHY CHEST WITH CONTRAST  TECHNIQUE: Multidetector CT imaging of the chest was performed using the standard protocol during bolus administration of intravenous contrast. Multiplanar CT image reconstructions and MIPs were obtained to evaluate the vascular anatomy.  CONTRAST:  OMNIPAQUE IOHEXOL 350 MG/ML SOLN  COMPARISON:  Chest radiograph dated 10/12/2014  FINDINGS: There is a patchy area ground-glass and nodular opacities in the right lower lobe. There is non opacification of a distal subsegmental branch of the pulmonary artery extending to this consolidated portion of the right lower lobe. This may be artifactual or represent  hypoperfusion secondary to hypoventilation. Distal branch pulmonary artery embolus is not excluded. Correlation with clinical exam and signs and symptoms for pneumonia recommended. No other pulmonary embolus identified. There is a small right pleural effusion. Small patchy areas of low-attenuation in the left upper and left lower lobe most compatible with air trapping. The central airways are patent.  The visualized thoracic aorta appear unremarkable. Mild cardiomegaly. No pericardial effusion. No hilar or mediastinal lymphadenopathy. The upper mediastinum appears unremarkable. The chest wall and the visualized upper abdomen is unremarkable. No acute fracture.  Review of the MIP images confirms the above findings.  IMPRESSION: Patchy right lower lobe consolidation most compatible with pneumonia and less likely representing infarct. Apparent non-opacification of the distal subsegmental pulmonary artery branch extending into the consolidated area may be artifactual or represent focal hypoperfusion. A distal branch pulmonary embolism is less likely, given no other pulmonary embolus identified, but not entirely excluded. Correlation with clinical exam and sign and symptoms of pneumonia recommended.   Electronically Signed   By: Elgie Collard M.D.   On: 10/12/2014 18:27     [name], MD 10/14/2014, 11:11 AM PGY-1, [facility] Family Medicine FPTS Intern pager: [phone], text pages welcome

## 2014-10-14 NOTE — Progress Notes (Signed)
Patient Profile: 44 y.o. 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 Denies alcohol use since Feb 2016 but reports 20 year history of heavy use.  H/o normal LCH in [place] but 2D echo showed EF of 20% and LV thrombus. Was placed on coumadin. Now followed by Dr. Antoine Poche.  Ran out of lasix and coumadin for 3 days and admitted  10/12/14 with left sided flank pain, possible right sided pneumonia and CHF exacerbation. Repeat echo 7/14 showed continued reduction in EF at 20% and a large chronic appearing partially calcified mural apical  thrombus.  Subjective: Feels better. Breathing improved but still with frequent hemoptysis and pleuritic right sided upper back/flank pain.    Objective: Vital signs in last 24 hours: Temp:  [97.6 F (36.4 C)-98.9 F (37.2 C)] 98.9 F (37.2 C) (07/15 0437) Pulse Rate:  [101-114] 114 (07/15 0437) Resp:  [18-20] 20 (07/15 0437) BP: (109-126)/(76-93) 111/78 mmHg (07/15 0437) SpO2:  [96 %-100 %] 100 % (07/15 0437) Weight:  [197 lb 1.6 oz (89.404 kg)] 197 lb 1.6 oz (89.404 kg) (07/15 0437) Last BM Date: 10/12/14  Intake/Output from previous day: 07/14 0701 - 07/15 0700 In: 908.8 [P.O.:840; I.V.:68.8] Out: 975 [Urine:975] Intake/Output this shift: Total I/O In: 120 [P.O.:120] Out: 375 [Urine:375]  Medications Current Facility-Administered Medications  Medication Dose Route Frequency Provider Last Rate Last Dose  . acetaminophen (TYLENOL) tablet 650 mg  650 mg Oral Q6H PRN Smitty Cords, DO       Or  . acetaminophen (TYLENOL) suppository 650 mg  650 mg Rectal Q6H PRN Smitty Cords, DO      . aspirin EC tablet 81 mg  81 mg Oral Daily Uvaldo Rising, MD   81 mg at 10/13/14 1204  . azithromycin (ZITHROMAX) tablet 250 mg  250 mg Oral Daily Smitty Cords, DO   250 mg at 10/13/14 1204  . benzonatate (TESSALON) capsule 100 mg  100 mg Oral TID PRN Smitty Cords, DO   100 mg at 10/13/14 1204  . cefTRIAXone  (ROCEPHIN) 1 g in dextrose 5 % 50 mL IVPB - Premix  1 g Intravenous Q24H Smitty Cords, DO   1 g at 10/13/14 1552  . folic acid (FOLVITE) tablet 1 mg  1 mg Oral Daily Smitty Cords, DO   1 mg at 10/13/14 1204  . heparin ADULT infusion 100 units/mL (25000 units/250 mL)  1,550 Units/hr Intravenous Continuous Uvaldo Rising, MD 15.5 mL/hr at 10/13/14 1947 1,550 Units/hr at 10/13/14 1947  . HYDROcodone-acetaminophen (NORCO/VICODIN) 5-325 MG per tablet 1-2 tablet  1-2 tablet Oral Q4H PRN Smitty Cords, DO   2 tablet at 10/14/14 [phone]  . LORazepam (ATIVAN) tablet 1 mg  1 mg Oral Q6H PRN Smitty Cords, DO       Or  . LORazepam (ATIVAN) injection 1 mg  1 mg Intravenous Q6H PRN Smitty Cords, DO      . losartan (COZAAR) tablet 50 mg  50 mg Oral Daily Wendall Stade, MD   50 mg at 10/13/14 1220  . metoprolol succinate (TOPROL-XL) 24 hr tablet 100 mg  100 mg Oral Daily Wendall Stade, MD   100 mg at 10/13/14 1220  . morphine 2 MG/ML injection 2 mg  2 mg Intravenous Q3H PRN Smitty Cords, DO      . multivitamin with minerals tablet 1 tablet  1 tablet Oral Daily Smitty Cords, DO   1 tablet at  10/13/14 1204  . ondansetron (ZOFRAN) tablet 4 mg  4 mg Oral Q6H PRN Smitty Cords, DO       Or  . ondansetron [facility]) injection 4 mg  4 mg Intravenous Q6H PRN Smitty Cords, DO      . phenol (CHLORASEPTIC) mouth spray 1 spray  1 spray Mouth/Throat PRN Smitty Cords, DO      . potassium chloride SA (K-DUR,KLOR-CON) CR tablet 40 mEq  40 mEq Oral BID [name], MD   40 mEq at 10/13/14 2151  . sodium chloride 0.9 % injection 3 mL  3 mL Intravenous Q12H Smitty Cords, DO   3 mL at 10/13/14 2152  . thiamine (VITAMIN B-1) tablet 100 mg  100 mg Oral Daily Smitty Cords, DO   100 mg at 10/13/14 1203   Or  . thiamine (B-1) injection 100 mg  100 mg Intravenous Daily Smitty Cords, DO        . Warfarin - Pharmacist Dosing Inpatient   Does not apply q1800 Robinette Haines, [facility]       Filed Weights   10/12/14 2038 10/13/14 0543 10/14/14 0437  Weight: 195 lb 11.2 oz (88.769 kg) 195 lb 11.2 oz (88.769 kg) 197 lb 1.6 oz (89.404 kg)   PE: General appearance: alert, cooperative and no distress Neck: no carotid bruit and no JVD Lungs: rales RLL Heart: regular rate and rhythm, S1, S2 normal, no murmur, click, rub or gallop Extremities: no LEE Pulses: 2+ and symmetric Skin: warm and dry Neurologic: Grossly normal  Lab Results:   Recent Labs  10/12/14 1710 10/13/14 0245 10/14/14 0315  WBC 12.4* 15.3* 20.3*  HGB 14.3 13.6 13.5  HCT 39.9 37.5* 38.3*  PLT 288 266 318   BMET  Recent Labs  10/12/14 1710 10/13/14 0245 10/14/14 0315  NA 136 137 134*  K 3.4* 3.0* 4.1  CL 101 99* 97*  CO2 GLUCOSE 105* 126* 96  BUN 22* 20 23*  CREATININE 1.56* 1.35* 1.72*  CALCIUM 8.3* 8.2* 8.6*   PT/INR  Recent Labs  10/12/14 1710 10/13/14 0930 10/14/14 0315  LABPROT 15.5* 16.4* 17.4*  INR 1.21 1.31 1.42    Studies/Results: 2D echo 10/13/14 Study Conclusions  - Left ventricle: Large chronic appearing partially calcified mural apical and apical lateral thrombus. The cavity size was severely dilated. Wall thickness was normal. The estimated ejection fraction was 20%. - Left atrium: The atrium was moderately dilated. - Atrial septum: No defect or patent foramen ovale was identified.   Assessment/Plan  Active Problems:   Pneumonia   CAP (community acquired pneumonia)   CHF (congestive heart failure)   Congestive dilated cardiomyopathy   Essential hypertension   History of alcohol abuse   Hemoptysis   LV (left ventricular) mural thrombus   1. Acute on Chronic Systolic CHF: EF remains low at 20%. Acute exacerbation in the setting of noncompliance with Lasix. BNP 879 on admit. Received IV Lasix yesterday. Doubt I/Os are accurate. Appears evoulemic on exam.  Dyspnea improved. Would hold lasix today given rise in SCr. Monitor volume status closely.   2. LV Mural Thrombus: persistent thrombus seen on f/u echo yesterday. INR subtherapeutic at 1.42. Continue Coumadin with heparin bridge.   3. 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  EF 20%. Continue BB and ARB therapy.   4. Renal Insufficieny: SCr continues to rise, now at 1.72 (1.35 on admit). Baseline SCr unknown (previously followed in [place]). Would hold lasix  today. F/u BMP in the am.   5. Presumed RLL CAP:  Per primary. On antibiotics.   6. Hemoptysis: still frequent. Blood seen in trash can by bedside. H/H stable. Still with right sided pleuritic pain which could be from PNA but with noncompliance with Coumadin and subtherapeutic INR, could  also be PE (Chest CT could not fully r/o distal branch pulmonary embolus). Regardless, he is already on anticoagulation with Coumadin + heparin bridge. We discussed importance of full compliance. Monitor for worsening hemoptysis.    LOS: 2 days    Brittainy M. Delmer Islam 10/14/2014 9:45 AM  Patient seen and examined  I agree with findings as noted by B Simmons above  Pt's volume status looks good  Note lasix held today  Chck Cr in AM   On IV heparin and coumadin being initiated for mural thrombus Will continue to follow.  Dietrich Pates

## 2014-10-15 DIAGNOSIS — I420 Dilated cardiomyopathy: Secondary | ICD-10-CM

## 2014-10-15 LAB — BASIC METABOLIC PANEL
ANION GAP: 13 (ref 5–15)
BUN: 37 mg/dL — ABNORMAL HIGH (ref 6–20)
CHLORIDE: 98 mmol/L — AB (ref 101–111)
CO2: 21 mmol/L — AB (ref 22–32)
Calcium: 8.6 mg/dL — ABNORMAL LOW (ref 8.9–10.3)
Creatinine, Ser: 1.99 mg/dL — ABNORMAL HIGH (ref 0.61–1.24)
GFR calc non Af Amer: 39 mL/min — ABNORMAL LOW (ref >60)
GFR, EST AFRICAN AMERICAN: 45 mL/min — AB (ref >60)
Glucose, Bld: 105 mg/dL — ABNORMAL HIGH (ref 65–99)
Potassium: 3.6 mmol/L (ref 3.5–5.1)
SODIUM: 132 mmol/L — AB (ref 135–145)

## 2014-10-15 LAB — CBC
HCT: 37.7 % — ABNORMAL LOW (ref 39.0–52.0)
Hemoglobin: 13.7 g/dL (ref 13.0–17.0)
MCH: 31.6 pg (ref 26.0–34.0)
MCHC: 36.3 g/dL — ABNORMAL HIGH (ref 30.0–36.0)
MCV: 86.9 fL (ref 78.0–100.0)
PLATELETS: 366 10*3/uL (ref 150–400)
RBC: 4.34 MIL/uL (ref 4.22–5.81)
RDW: 15.2 % (ref 11.5–15.5)
WBC: 16.8 10*3/uL — AB (ref 4.0–10.5)

## 2014-10-15 LAB — HEPARIN LEVEL (UNFRACTIONATED): HEPARIN UNFRACTIONATED: 0.51 [IU]/mL (ref 0.30–0.70)

## 2014-10-15 LAB — PROTIME-INR
INR: 1.51 — ABNORMAL HIGH (ref 0.00–1.49)
PROTHROMBIN TIME: 18.3 s — AB (ref 11.6–15.2)

## 2014-10-15 MED ORDER — LOSARTAN POTASSIUM 25 MG PO TABS
25.0000 mg | ORAL_TABLET | Freq: Every day | ORAL | Status: DC
  Administered 2014-10-15: 25 mg via ORAL
  Filled 2014-10-15: qty 1

## 2014-10-15 MED ORDER — WARFARIN SODIUM 10 MG PO TABS
10.0000 mg | ORAL_TABLET | Freq: Once | ORAL | Status: AC
Start: 2014-10-15 — End: 2014-10-15
  Administered 2014-10-15: 10 mg via ORAL
  Filled 2014-10-15: qty 1

## 2014-10-15 MED ORDER — POLYETHYLENE GLYCOL 3350 17 G PO PACK
17.0000 g | PACK | Freq: Every day | ORAL | Status: DC
Start: 2014-10-15 — End: 2014-10-19
  Administered 2014-10-16: 17 g via ORAL
  Filled 2014-10-15 (×6): qty 1

## 2014-10-15 NOTE — Progress Notes (Signed)
Family Medicine Teaching Service Daily Progress Note Intern Pager: [phone]  Patient name: Robert Booth Medical record number: [id number] Date of birth: 01-26-1971 Age: 44 y.o. Gender: male  Primary Care Provider: No primary care provider on file. Consultants: Cardiology Code Status: FULL   Assessment and Plan: Robert Booth is a 44 y.o. male presenting with cough, SOB, hemoptysis and pleuritic chest pain. PMH is significant for HTN, dilated cardiomyopathy (NICM, reduced EF), alcoholism, and anxiety.   DOE / Hemoptysis, presumed RLL CAP Cough beginning 10 days ago with increasing SOB / DOE and hemoptysis observed on admission. Also reports worsening pleuritic R flank, back pain. Most likely CAP given imaging (CXR then confirmed on CTA with RLL consolidation likely pneumonia less likely infarct) despite lack of systemic symptoms, afebrile, no hypoxia. Received one dose ctx and Duoneb in ED. Denies fevers. WBC 12.4 on admission. Differential includes initial high concern for PE (Well's score 4, active LV thrombus and off Coumadin x 1 week INR 1.2, however Chest CTA w/o evidence acute PE, can't r/o distal PE), ?malignancy (h/o tobacco).  - Continue CTX 1g IV q 24 hr (7/13>>) and Azithro switch to PO  qd x 4 days (7/13>7/17) - Repeat CXR shows increased opacity in RLL consistent with PNA - Lactic Acid trending down 2.54 > 2.1 >1.6 - BCx - no growth x 2 days - Norco for chest pain - Awaiting prior medical records from [facility] in [place] - Monitor INR, can d/c patient when INR therapeutic  - Continue heparin bridge - INR 1.5 today   Presumed Acute on Chronic Systolic CHF / Dilated NICM Dx in 04/2014, prior ECHO reduced LVEF 20% (per Cards, no record of ECHO in [place]), s/p cardiac cath in 2016 showed normal coronary arteries. Dx LV thrombus in 03/16. Missed three doses of Lasix last week, also stopped taking coumadin last week. Positive orthopnea. Additionally, no active chest  pressure/tightness or typical anginal symptoms, 43 yr w/o CAD, ACS is highly unlikely, suspect troponin related to demand ischemia. - Elevated BNP 879 but may be consistently elevated due to dilated cardiomyopathy (no comparison lab) - Repeat troponins remain 0.21 as on admission - EKG - sinus tach with T wave abnormality; repeat EKG same findings as initial - Strict I/O, Daily weights - Echo (07/14) - large chronic-appearing partially calcified thrombus, EF 20% - Patient reports he sees Dr. Antoine Poche for cards f/u in [place] and had appointment today to check INR - Per cardiology, hold Lasix  Vision change: reports "un-focused but not blurry" vision for two days prior to presentation. Reports closing either eye improves vision.  - Continue to monitor for new or improving vision changes  AKI: Elevated SCr 1.56 (0.95 in 2014)  - IVF - Continue to monitor - Per cards, resume ARB - Cr 1.56>>1.35>>1.72>>1.99 (07/16)   Hypokalemia:  -K improved to 4.1 today  -Monitor   HTN On admit, low BP with SBP 90-100s. Gradually improving. Concern with low BP and significant amount of pain initially. BP improved with initial IVF in ED, continues to improve. No evidence hypotension, concern for sepsis with CAP but no evidence severe sepsis. - Home losartan, metoprolol  Anxiety: not currently medically treated  Alcohol Abuse - CIWA  Constipation: last bowel movement four days ago -Miralax  FEN/GI: heart healthy diet  Prophylaxis: warfarin , begin heparin infusion at 1400 units/hr   Disposition: home pending therapeutic INR  Subjective:  Robert Booth reports that he is feeling better today. He still has hemoptysis, but says it  is occuring less frequently. Bloody sputum was preset in trash can next to his bed. His chest pain is still present but also improving. He reports that he has not had a bowel movement in four days.   Objective: Temp:  [97.3 F (36.3 C)-98 F (36.7 C)] 97.7 F  (36.5 C) (07/16 0530) Pulse Rate:  [98-113] 100 (07/16 0530) Resp:  [18] 18 (07/16 0530) BP: (102-122)/(73-80) 116/73 mmHg (07/16 0530) SpO2:  [89 %-98 %] 89 % (07/16 0530) Weight:  [198 lb (89.812 kg)] 198 lb (89.812 kg) (07/16 0530) Physical Exam: General: well-appearing male sitting up in bed in NAD ENTM: MMM, no blood in oropharynx or nares Cardiovascular: RRR, no murmurs appreciated Respiratory: improved airflow, no crackles or wheezing Abdomen: non-tender, soft, non-distended, +BS MSK: no R flank tenderness Neuro: no focal deficits, A&Ox3 Psych: appropriate mood and affect  Laboratory:  Recent Labs Lab 10/13/14 0245 10/14/14 0315 10/15/14 0308  WBC 15.3* 20.3* PENDING  HGB 13.6 13.5 13.7  HCT 37.5* 38.3* 37.7*  PLT 266 318 PENDING    Recent Labs Lab 10/12/14 1710 10/13/14 0245 10/14/14 0315 10/15/14 0308  NA 136 137 134* 132*  K 3.4* 3.0* 4.1 3.6  CL 101 99* 97* 98*  CO2 22 25 27  21*  BUN 22* 20 23* 37*  CREATININE 1.56* 1.35* 1.72* 1.99*  CALCIUM 8.3* 8.2* 8.6* 8.6*  PROT 6.5  --   --   --   BILITOT 1.3*  --   --   --   ALKPHOS 85  --   --   --   ALT 167*  --   --   --   AST 129*  --   --   --   GLUCOSE 105* 126* 96 105*    Imaging/Diagnostic Tests: X-ray Chest Pa And Lateral  10/13/2014   CLINICAL DATA:  Hemoptysis, community-acquired pneumonia, CHF, previous tobacco use.  EXAM: CHEST  2 VIEW  COMPARISON:  Chest x-ray and chest CT scan of October 12, 2014  FINDINGS: There is increased density in the right lower lobe with further obscuration of the hemidiaphragm. An air bronchogram is visible on the lateral film. The right middle and upper lobes are clear as is the left lung. The cardiac silhouette is mildly enlarged. The central pulmonary vascularity is prominent though stable. The bony thorax is unremarkable.  IMPRESSION: Increased opacity in the right lower lobe consistent with pneumonia. Mild cardiomegaly without pulmonary edema.   Electronically Signed    By: David  Swaziland M.D.   On: 10/13/2014 07:22   Dg Chest 2 View  10/12/2014   CLINICAL DATA:  44 year old male with 1 week history of productive cough and 1 day history of hemoptysis accompanied by right flank and upper back pain.  EXAM: CHEST  2 VIEW  COMPARISON:  None.  FINDINGS: Focal patchy airspace opacity in the anterior aspect of the right lower lobe concerning for bronchopneumonia. Otherwise, the lungs are clear. The cardiac and mediastinal contours are within normal limits. Osseous structures are intact and unremarkable.  IMPRESSION: Imaging findings are most consistent with right lower lobe pneumonia.   Electronically Signed   By: Malachy Moan M.D.   On: 10/12/2014 15:55   Ct Angio Chest Pe W/cm &/or Wo Cm  10/12/2014   CLINICAL DATA:  44 year old male with right-sided chest pain and shortness of breath  EXAM: CT ANGIOGRAPHY CHEST WITH CONTRAST  TECHNIQUE: Multidetector CT imaging of the chest was performed using the standard protocol during bolus administration of  intravenous contrast. Multiplanar CT image reconstructions and MIPs were obtained to evaluate the vascular anatomy.  CONTRAST:  OMNIPAQUE IOHEXOL 350 MG/ML SOLN  COMPARISON:  Chest radiograph dated 10/12/2014  FINDINGS: There is a patchy area ground-glass and nodular opacities in the right lower lobe. There is non opacification of a distal subsegmental branch of the pulmonary artery extending to this consolidated portion of the right lower lobe. This may be artifactual or represent hypoperfusion secondary to hypoventilation. Distal branch pulmonary artery embolus is not excluded. Correlation with clinical exam and signs and symptoms for pneumonia recommended. No other pulmonary embolus identified. There is a small right pleural effusion. Small patchy areas of low-attenuation in the left upper and left lower lobe most compatible with air trapping. The central airways are patent.  The visualized thoracic aorta appear unremarkable.  Mild cardiomegaly. No pericardial effusion. No hilar or mediastinal lymphadenopathy. The upper mediastinum appears unremarkable. The chest wall and the visualized upper abdomen is unremarkable. No acute fracture.  Review of the MIP images confirms the above findings.  IMPRESSION: Patchy right lower lobe consolidation most compatible with pneumonia and less likely representing infarct. Apparent non-opacification of the distal subsegmental pulmonary artery branch extending into the consolidated area may be artifactual or represent focal hypoperfusion. A distal branch pulmonary embolism is less likely, given no other pulmonary embolus identified, but not entirely excluded. Correlation with clinical exam and sign and symptoms of pneumonia recommended.   Electronically Signed   By: Elgie Collard M.D.   On: 10/12/2014 18:27     [name], MD 10/15/2014, 8:30 AM PGY-1, [facility] FPTS Intern pager: [phone], text pages welcome

## 2014-10-15 NOTE — Progress Notes (Signed)
ANTICOAGULATION CONSULT NOTE - Follow Up Consult  Pharmacy Consult for Heparin and Warfarin   Indication: LV thrombus  Allergies  Allergen Reactions  . Celexa [Citalopram Hydrobromide] Other (See Comments)    DISORIENTED  . Lisinopril Cough    Patient Measurements: Height: 5\' 9"  (175.3 cm) Weight: 198 lb (89.812 kg) IBW/kg (Calculated) : 70.7   Vital Signs: Temp: 97.7 F (36.5 C) (07/16 0530) Temp Source: Oral (07/16 0530) BP: 103/74 mmHg (07/16 1036) Pulse Rate: 100 (07/16 1036)  Labs:  Recent Labs  10/12/14 2156 10/13/14 0245 10/13/14 0930  10/14/14 0315 10/14/14 0900 10/14/14 1530 10/15/14 0308  HGB  --  13.6  --   --  13.5  --   --  13.7  HCT  --  37.5*  --   --  38.3*  --   --  37.7*  PLT  --  266  --   --  318  --   --  366  LABPROT  --   --  16.4*  --  17.4*  --   --  18.3*  INR  --   --  1.31  --  1.42  --   --  1.51*  HEPARINUNFRC  --   --   --   < >  --  <0.10* 0.37 0.51  CREATININE  --  1.35*  --   --  1.72*  --   --  1.99*  TROPONINI 0.21* 0.21* 0.16*  --   --   --   --   --   < > = values in this interval not displayed.  Estimated Creatinine Clearance: 52.5 mL/min (by C-G formula based on Cr of 1.99).   Medications:  Scheduled:  . aspirin EC  81 mg Oral Daily  . azithromycin  250 mg Oral Daily  . folic acid  1 mg Oral Daily  . losartan  25 mg Oral Daily  . metoprolol succinate  100 mg Oral Daily  . multivitamin with minerals  1 tablet Oral Daily  . polyethylene glycol  17 g Oral Daily  . sodium chloride  3 mL Intravenous Q12H  . thiamine  100 mg Oral Daily   Or  . thiamine  100 mg Intravenous Daily  . Warfarin - Pharmacist Dosing Inpatient   Does not apply q1800   Infusions:  . heparin 1,550 Units/hr (10/15/14 0416)   PRN: acetaminophen **OR** acetaminophen, benzonatate, HYDROcodone-acetaminophen, LORazepam **OR** LORazepam, morphine injection, ondansetron **OR** ondansetron (ZOFRAN) IV, phenol  Assessment: 43 YOM on coumadin pta (10  mg daily) for LV thrombus, admitted with hemoptysis, SOB and cough. INR 1.2 on admission 2/2 noncompliance. Coumadin ordered to continue however dose held 7/13 due to hemoptysis. Heparin bridge added 7/14.  HL 0.37, 0.51 therapeutic x 2 on 7/16. Warfarin 10 mg given 7/15 with INR increasing from 1.42>>1.51.  CBC stable WNL.  Per Cards Note pt still has frequent hemoptysis but MD did not decide to hold anticoagulation at this time - Will monitor for worsening hemoptysis.  Nurse states hemoptysis is still frequent but has improved since yesterday  Goal of Therapy:  INR 2-3 Heparin level 0.3-0.7 units/ml Monitor platelets by anticoagulation protocol: Yes   Plan:  - continue heparin at 1550 units/hr  - coumadin 10mg  x 1 - Daily INR/PT - Daily HL/CBC - Monitor for s/sx of bleeding and worsening hemoptysis   Synetta Fail, PharmD Pharmacy Resident   10/15/2014,11:53 AM

## 2014-10-15 NOTE — Progress Notes (Signed)
SUBJECTIVE:  Still cough with some hemoptysis   PHYSICAL EXAM Filed Vitals:   10/14/14 1620 10/14/14 2100 10/15/14 0530 10/15/14 1036  BP: 105/75 102/80 116/73 103/74  Pulse: 102 98 100 100  Temp:  98 F (36.7 C) 97.7 F (36.5 C)   TempSrc:  Oral Oral   Resp: 18 18 18    Height:      Weight:   198 lb (89.812 kg)   SpO2: 98% 98% 89%    General:  No distress Lungs:  Decreased breath sounds Heart:  RRR, tachy  Abdomen:  Positive bowel sounds, no rebound no guarding Extremities:  No edema  LABS: Lab Results  Component Value Date   TROPONINI 0.16* 10/13/2014   Results for orders placed or performed during the hospital encounter of 10/12/14 (from the past 24 hour(s))  Heparin level (unfractionated)     Status: None   Collection Time: 10/14/14  3:30 PM  Result Value Ref Range   Heparin Unfractionated 0.37 0.30 - 0.70 IU/mL  Protime-INR     Status: Abnormal   Collection Time: 10/15/14  3:08 AM  Result Value Ref Range   Prothrombin Time 18.3 (H) 11.6 - 15.2 seconds   INR 1.51 (H) 0.00 - 1.49  Basic metabolic panel     Status: Abnormal   Collection Time: 10/15/14  3:08 AM  Result Value Ref Range   Sodium 132 (L) 135 - 145 mmol/L   Potassium 3.6 3.5 - 5.1 mmol/L   Chloride 98 (L) 101 - 111 mmol/L   CO2 21 (L) 22 - 32 mmol/L   Glucose, Bld 105 (H) 65 - 99 mg/dL   BUN 37 (H) 6 - 20 mg/dL   Creatinine, Ser 0.56 (H) 0.61 - 1.24 mg/dL   Calcium 8.6 (L) 8.9 - 10.3 mg/dL   GFR calc non Af Amer 39 (L) >60 mL/min   GFR calc Af Amer 45 (L) >60 mL/min   Anion gap 13 5 - 15  CBC     Status: Abnormal   Collection Time: 10/15/14  3:08 AM  Result Value Ref Range   WBC 16.8 (H) 4.0 - 10.5 K/uL   RBC 4.34 4.22 - 5.81 MIL/uL   Hemoglobin 13.7 13.0 - 17.0 g/dL   HCT 97.9 (L) 48.0 - 16.5 %   MCV 86.9 78.0 - 100.0 fL   MCH 31.6 26.0 - 34.0 pg   MCHC 36.3 (H) 30.0 - 36.0 g/dL   RDW 53.7 48.2 - 70.7 %   Platelets 366 150 - 400 K/uL  Heparin level (unfractionated)     Status: None     Collection Time: 10/15/14  3:08 AM  Result Value Ref Range   Heparin Unfractionated 0.51 0.30 - 0.70 IU/mL    Intake/Output Summary (Last 24 hours) at 10/15/14 1219 Last data filed at 10/15/14 0729  Gross per 24 hour  Intake    480 ml  Output    550 ml  Net    -70 ml      ASSESSMENT AND PLAN:  ACUTE ON CHRONIC SYSTOLIC HF:  Holding diuretic and ACE inhibitor for today.  He will likely need advanced therapies in the future if he can demonstrate compliance with meds and follow up.  I discussed this with him and his mother today.   CKD:   Creat is increasing.   Lasix on hold.  I will stop the ACE inhibitor for now but I suspect that we will be able to restart this.   MURAL  THROMBUS:  On heparin and warfarin.      Fayrene Fearing [facility] 10/15/2014 12:19 PM

## 2014-10-16 DIAGNOSIS — I213 ST elevation (STEMI) myocardial infarction of unspecified site: Secondary | ICD-10-CM

## 2014-10-16 DIAGNOSIS — F1010 Alcohol abuse, uncomplicated: Secondary | ICD-10-CM

## 2014-10-16 DIAGNOSIS — J189 Pneumonia, unspecified organism: Principal | ICD-10-CM

## 2014-10-16 LAB — CBC
HEMATOCRIT: 36.8 % — AB (ref 39.0–52.0)
HEMOGLOBIN: 13.2 g/dL (ref 13.0–17.0)
MCH: 31.2 pg (ref 26.0–34.0)
MCHC: 35.9 g/dL (ref 30.0–36.0)
MCV: 87 fL (ref 78.0–100.0)
Platelets: 416 10*3/uL — ABNORMAL HIGH (ref 150–400)
RBC: 4.23 MIL/uL (ref 4.22–5.81)
RDW: 15.6 % — ABNORMAL HIGH (ref 11.5–15.5)
WBC: 13.6 10*3/uL — ABNORMAL HIGH (ref 4.0–10.5)

## 2014-10-16 LAB — BASIC METABOLIC PANEL
ANION GAP: 11 (ref 5–15)
BUN: 33 mg/dL — ABNORMAL HIGH (ref 6–20)
CHLORIDE: 97 mmol/L — AB (ref 101–111)
CO2: 25 mmol/L (ref 22–32)
Calcium: 8.4 mg/dL — ABNORMAL LOW (ref 8.9–10.3)
Creatinine, Ser: 1.42 mg/dL — ABNORMAL HIGH (ref 0.61–1.24)
GFR calc Af Amer: >60 mL/min (ref >60)
GFR calc non Af Amer: 59 mL/min — ABNORMAL LOW (ref >60)
GLUCOSE: 96 mg/dL (ref 65–99)
Potassium: 3.6 mmol/L (ref 3.5–5.1)
SODIUM: 133 mmol/L — AB (ref 135–145)

## 2014-10-16 LAB — HEPARIN LEVEL (UNFRACTIONATED)
HEPARIN UNFRACTIONATED: 0.28 [IU]/mL — AB (ref 0.30–0.70)
HEPARIN UNFRACTIONATED: 0.49 [IU]/mL (ref 0.30–0.70)
Heparin Unfractionated: 0.57 IU/mL (ref 0.30–0.70)

## 2014-10-16 LAB — PROTIME-INR
INR: 1.7 — AB (ref 0.00–1.49)
PROTHROMBIN TIME: 20 s — AB (ref 11.6–15.2)

## 2014-10-16 MED ORDER — WARFARIN SODIUM 10 MG PO TABS
10.0000 mg | ORAL_TABLET | Freq: Once | ORAL | Status: AC
  Administered 2014-10-16: 10 mg via ORAL
  Filled 2014-10-16: qty 1

## 2014-10-16 MED ORDER — FUROSEMIDE 40 MG PO TABS
40.0000 mg | ORAL_TABLET | Freq: Every day | ORAL | Status: DC
  Administered 2014-10-17: 40 mg via ORAL
  Filled 2014-10-16 (×2): qty 1

## 2014-10-16 NOTE — Progress Notes (Signed)
Family Medicine Teaching Service Daily Progress Note Intern Pager: [phone]  Patient name: Robert Booth Medical record number: [id number] Date of birth: [birth year] Age: 44 y.o. Gender: male  Primary Care Provider: No primary care provider on file. Consultants: Cardiology Code Status: FULL   Assessment and Plan: Winfield Caba is a 44 y.o. male presenting with cough, SOB, hemoptysis and pleuritic chest pain. PMH is significant for HTN, dilated cardiomyopathy (NICM, reduced EF), alcoholism, and anxiety.   DOE / Hemoptysis, presumed RLL CAP:  Most likely CAP given imaging (CXR then confirmed on CTA with RLL consolidation likely pneumonia less likely infarct) despite lack of systemic symptoms, afebrile, no hypoxia.  Differential includes initial high concern for PE (Well's score 4, active LV thrombus and off Coumadin x 1 week INR 1.2, however Chest CTA w/o evidence acute PE, can't r/o distal PE), ?malignancy (h/o tobacco).  - s/p CTX. Azithro switch to PO  qd x 4 days (7/13>7/17) - BCx - no growth x 3 days - Norco for chest pain - Awaiting prior medical records from [facility] in [place] - Monitor INR, can d/c patient when INR therapeutic  - Continue heparin bridgeper pharm - INR 1.7 today   Acute on Chronic Systolic CHF / Dilated NICM:  ACS is highly unlikely, suspect troponin related to demand ischemia. - Elevated BNP 879 but may be consistently elevated due to dilated cardiomyopathy (no comparison lab) - Repeat troponins remain 0.21 as on admission - EKG - sinus tach with T wave abnormality; repeat EKG same findings as initial - Strict I/O, Daily weights - Echo (07/14) - large chronic-appearing partially calcified thrombus, EF 20% - Per cardiology, hold Lasix  Vision change: reports "un-focused but not blurry" vision for two days prior to presentation. Reports closing either eye improves vision.  - Continue to monitor for new or improving vision changes  AKI: Elevated SCr 1.56 (0.95 in  2014)  - Holding home ACEi - Cr 1.56>>1.35>>1.72>>1.99 >> 1.42 today  - Continue to monitor  Hypokalemia: Resolved, continue to monitor  HTN: Low normotensive O/N - Home losartan, metoprolol  Anxiety: not currently medically treated  Alcohol Abuse - CIWA -scoring 0, no ativan in last 24h  Constipation: reports it is resolved -Miralax  FEN/GI: heart healthy diet  Prophylaxis: coumadin and heparin bridge per pharm   Disposition: home pending therapeutic INR  Subjective:  Mr. Rotundo reports that he is feeling better today. He still has hemoptysis, but says it is minimal. Denies any other complaints  Objective: Temp:  [98 F (36.7 C)-98.2 F (36.8 C)] 98.2 F (36.8 C) (07/17 0538) Pulse Rate:  [92-119] 101 (07/17 0538) Resp:  [18] 18 (07/17 0538) BP: (100-108)/(73-78) 108/74 mmHg (07/17 0538) SpO2:  [97 %-100 %] 97 % (07/17 0538) Weight:  [199 lb 9.6 oz (90.538 kg)] 199 lb 9.6 oz (90.538 kg) (07/17 0538) Physical Exam: General: well-appearing male sitting up in bed in NAD ENTM: MMM, no blood in oropharynx or nares Cardiovascular: RRR, no murmurs appreciated Respiratory: CTAB, no crackles or wheezing, normal WOB, on RA Abdomen: non-tender, soft, non-distended, +BS Neuro: no focal deficits, A&Ox3 Psych: appropriate mood and affect  Laboratory:  Recent Labs Lab 10/14/14 0315 10/15/14 0308 10/16/14 0407  WBC 20.3* 16.8* 13.6*  HGB 13.5 13.7 13.2  HCT 38.3* 37.7* 36.8*  PLT 318 366 416*    Recent Labs Lab 10/12/14 1710  10/14/14 0315 10/15/14 0308 10/16/14 0407  NA 136  < > 134* 132* 133*  K 3.4*  < > 4.1 3.6  3.6  CL 101  < > 97* 98* 97*  CO2 22  < > 27 21* 25  BUN 22*  < > 23* 37* 33*  CREATININE 1.56*  < > 1.72* 1.99* 1.42*  CALCIUM 8.3*  < > 8.6* 8.6* 8.4*  PROT 6.5  --   --   --   --   BILITOT 1.3*  --   --   --   --   ALKPHOS 85  --   --   --   --   ALT 167*  --   --   --   --   AST 129*  --   --   --   --   GLUCOSE 105*  < > 96 105* 96  < > =  values in this interval not displayed.  Imaging/Diagnostic Tests: X-ray Chest Pa And Lateral  10/13/2014   CLINICAL DATA:  Hemoptysis, community-acquired pneumonia, CHF, previous tobacco use.  EXAM: CHEST  2 VIEW  COMPARISON:  Chest x-ray and chest CT scan of October 12, 2014  FINDINGS: There is increased density in the right lower lobe with further obscuration of the hemidiaphragm. An air bronchogram is visible on the lateral film. The right middle and upper lobes are clear as is the left lung. The cardiac silhouette is mildly enlarged. The central pulmonary vascularity is prominent though stable. The bony thorax is unremarkable.  IMPRESSION: Increased opacity in the right lower lobe consistent with pneumonia. Mild cardiomegaly without pulmonary edema.   Electronically Signed   By: David  Swaziland M.D.   On: 10/13/2014 07:22   Dg Chest 2 View  10/12/2014   CLINICAL DATA:  44 year old male with 1 week history of productive cough and 1 day history of hemoptysis accompanied by right flank and upper back pain.  EXAM: CHEST  2 VIEW  COMPARISON:  None.  FINDINGS: Focal patchy airspace opacity in the anterior aspect of the right lower lobe concerning for bronchopneumonia. Otherwise, the lungs are clear. The cardiac and mediastinal contours are within normal limits. Osseous structures are intact and unremarkable.  IMPRESSION: Imaging findings are most consistent with right lower lobe pneumonia.   Electronically Signed   By: Malachy Moan M.D.   On: 10/12/2014 15:55   Ct Angio Chest Pe W/cm &/or Wo Cm  10/12/2014   CLINICAL DATA:  44 year old male with right-sided chest pain and shortness of breath  EXAM: CT ANGIOGRAPHY CHEST WITH CONTRAST  TECHNIQUE: Multidetector CT imaging of the chest was performed using the standard protocol during bolus administration of intravenous contrast. Multiplanar CT image reconstructions and MIPs were obtained to evaluate the vascular anatomy.  CONTRAST:  OMNIPAQUE IOHEXOL 350  MG/ML SOLN  COMPARISON:  Chest radiograph dated 10/12/2014  FINDINGS: There is a patchy area ground-glass and nodular opacities in the right lower lobe. There is non opacification of a distal subsegmental branch of the pulmonary artery extending to this consolidated portion of the right lower lobe. This may be artifactual or represent hypoperfusion secondary to hypoventilation. Distal branch pulmonary artery embolus is not excluded. Correlation with clinical exam and signs and symptoms for pneumonia recommended. No other pulmonary embolus identified. There is a small right pleural effusion. Small patchy areas of low-attenuation in the left upper and left lower lobe most compatible with air trapping. The central airways are patent.  The visualized thoracic aorta appear unremarkable. Mild cardiomegaly. No pericardial effusion. No hilar or mediastinal lymphadenopathy. The upper mediastinum appears unremarkable. The chest wall and the visualized  upper abdomen is unremarkable. No acute fracture.  Review of the MIP images confirms the above findings.  IMPRESSION: Patchy right lower lobe consolidation most compatible with pneumonia and less likely representing infarct. Apparent non-opacification of the distal subsegmental pulmonary artery branch extending into the consolidated area may be artifactual or represent focal hypoperfusion. A distal branch pulmonary embolism is less likely, given no other pulmonary embolus identified, but not entirely excluded. Correlation with clinical exam and sign and symptoms of pneumonia recommended.   Electronically Signed   By: Elgie Collard M.D.   On: 10/12/2014 18:27     Erasmo Downer, MD 10/16/2014, 8:45 AM PGY-2, [facility] FPTS Intern pager: [phone], text pages welcome

## 2014-10-16 NOTE — Progress Notes (Addendum)
SUBJECTIVE:   Still coughing. Leukocytosis is improving. Creatinine has improved to 1.42 overnight from 1.99.  Suggest we are probably at a diuretic endpoint. INR is now 1.7.   PHYSICAL EXAM Filed Vitals:   10/15/14 1036 10/15/14 1300 10/15/14 2144 10/16/14 0538  BP: 103/74 107/73 100/78 108/74  Pulse: 100 92 119 101  Temp:  98 F (36.7 C) 98 F (36.7 C) 98.2 F (36.8 C)  TempSrc:  Oral Oral Oral  Resp:  Height:      Weight:    199 lb 9.6 oz (90.538 kg)  SpO2:  100% 98% 97%   General:  No distress Lungs:  Decreased breath sounds Heart:  RRR, tachy  Abdomen:  Positive bowel sounds, no rebound no guarding Extremities:  No edema  LABS: Lab Results  Component Value Date   TROPONINI 0.16* 10/13/2014   Results for orders placed or performed during the hospital encounter of 10/12/14 (from the past 24 hour(s))  Heparin level (unfractionated)     Status: Abnormal   Collection Time: 10/16/14  4:07 AM  Result Value Ref Range   Heparin Unfractionated 0.28 (L) 0.30 - 0.70 IU/mL  CBC     Status: Abnormal   Collection Time: 10/16/14  4:07 AM  Result Value Ref Range   WBC 13.6 (H) 4.0 - 10.5 K/uL   RBC 4.23 4.22 - 5.81 MIL/uL   Hemoglobin 13.2 13.0 - 17.0 g/dL   HCT 16.1 (L) 09.6 - 04.5 %   MCV 87.0 78.0 - 100.0 fL   MCH 31.2 26.0 - 34.0 pg   MCHC 35.9 30.0 - 36.0 g/dL   RDW 40.9 (H) 81.1 - 91.4 %   Platelets 416 (H) 150 - 400 K/uL  Basic metabolic panel     Status: Abnormal   Collection Time: 10/16/14  4:07 AM  Result Value Ref Range   Sodium 133 (L) 135 - 145 mmol/L   Potassium 3.6 3.5 - 5.1 mmol/L   Chloride 97 (L) 101 - 111 mmol/L   CO2 25 22 - 32 mmol/L   Glucose, Bld 96 65 - 99 mg/dL   BUN 33 (H) 6 - 20 mg/dL   Creatinine, Ser 7.82 (H) 0.61 - 1.24 mg/dL   Calcium 8.4 (L) 8.9 - 10.3 mg/dL   GFR calc non Af Amer 59 (L) >60 mL/min   GFR calc Af Amer >60 >60 mL/min   Anion gap 11 5 - 15  Protime-INR     Status: Abnormal   Collection Time: 10/16/14  4:07  AM  Result Value Ref Range   Prothrombin Time 20.0 (H) 11.6 - 15.2 seconds   INR 1.70 (H) 0.00 - 1.49    Intake/Output Summary (Last 24 hours) at 10/16/14 0846 Last data filed at 10/16/14 0454  Gross per 24 hour  Intake 1197.5 ml  Output    500 ml  Net  697.5 ml    ASSESSMENT AND PLAN:  ACUTE ON CHRONIC SYSTOLIC HF:  Hold lasix today and restart lasix 40 mg daily for maintenance tomorrow.  He will likely need advanced therapies in the future if he can demonstrate compliance with meds and follow up.  I discussed this with him and his mother today.   CKD:   Creat improved. Off lasix and ACE inhibitor. Consider restarting ARB at discharge - he report intolerance to ACE-I (listed as allergy).  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  Close to discharge.      Chrystie Nose, MD, [facility] Attending Cardiologist [facility]  [facility] 10/16/2014 8:46 AM

## 2014-10-16 NOTE — Progress Notes (Addendum)
ANTICOAGULATION CONSULT NOTE - Follow Up Consult  Pharmacy Consult for Heparin and Warfarin   Indication: LV thrombus  Allergies  Allergen Reactions  . Celexa [Citalopram Hydrobromide] Other (See Comments)    DISORIENTED  . Lisinopril Cough    Patient Measurements: Height: 5\' 9"  (175.3 cm) Weight: 199 lb 9.6 oz (90.538 kg) IBW/kg (Calculated) : 70.7   Vital Signs: Temp: 97.5 F (36.4 C) (07/17 1415) Temp Source: Oral (07/17 1415) BP: 125/91 mmHg (07/17 1415) Pulse Rate: 100 (07/17 1415)  Labs:  Recent Labs  10/14/14 0315  10/14/14 1530 10/15/14 0308 10/16/14 0407  HGB 13.5  --   --  13.7 13.2  HCT 38.3*  --   --  37.7* 36.8*  PLT 318  --   --  366 416*  LABPROT 17.4*  --   --  18.3* 20.0*  INR 1.42  --   --  1.51* 1.70*  HEPARINUNFRC  --   < > 0.37 0.51 0.28*  CREATININE 1.72*  --   --  1.99* 1.42*  < > = values in this interval not displayed.  Estimated Creatinine Clearance: 73.8 mL/min (by C-G formula based on Cr of 1.42).   Medications:  Scheduled:  . aspirin EC  81 mg Oral Daily  . folic acid  1 mg Oral Daily  . [START ON 10/17/2014] furosemide  40 mg Oral Daily  . metoprolol succinate  100 mg Oral Daily  . multivitamin with minerals  1 tablet Oral Daily  . polyethylene glycol  17 g Oral Daily  . sodium chloride  3 mL Intravenous Q12H  . thiamine  100 mg Oral Daily   Or  . thiamine  100 mg Intravenous Daily  . Warfarin - Pharmacist Dosing Inpatient   Does not apply q1800   Infusions:  . heparin 1,700 Units/hr (10/16/14 1412)   PRN: acetaminophen **OR** acetaminophen, benzonatate, HYDROcodone-acetaminophen, morphine injection, ondansetron **OR** ondansetron (ZOFRAN) IV, phenol  Assessment: 43 YOM continuing on heparin bridge + coumadin (pta) for LV thrombus. Admitted with hemoptysis, SOB and cough. INR 1.2 on admission due to noncompliance. Coumadin held 7/13 due to hemoptysis. Heparin bridge added 7/14.   PTA warfarin dose: 10 mg daily  HL  therapeutic (0.57) x1 on 1700 units/h INR increasing, still subtherapeutic 1.51>>1.7 Hg stable wnl, plt up to 416  Hemoptysis still noted but reportedly minimal in comparison to previous days - Will monitor for worsening - per MD ok to continue anticoagulation.  Goal of Therapy:  INR 2-3 Heparin level 0.3-0.7 units/ml Monitor platelets by anticoagulation protocol: Yes   Plan:  - continue heparin at 1700 units/hr  - coumadin 10mg  x 1 - 6h HL to confirm - Daily INR/PT - Daily HL/CBC - Monitor for s/sx of bleeding and worsening hemoptysis   Babs Bertin, PharmD Clinical Pharmacist Pager [phone] 10/16/2014 3:01 PM  Addendum: Follow up heparin level continues to be at goal. No issues noted. Continue at current rate, follow up with am HL.  Sheppard Coil PharmD., BCPS Clinical Pharmacist Pager [phone] 10/16/2014 10:05 PM

## 2014-10-16 NOTE — Progress Notes (Signed)
ANTICOAGULATION CONSULT NOTE - Follow Up Consult  Pharmacy Consult for heparin Indication: mural thrombus   Labs:  Recent Labs  10/13/14 0930  10/14/14 0315  10/14/14 1530 10/15/14 0308 10/16/14 0407  HGB  --   < > 13.5  --   --  13.7 13.2  HCT  --   --  38.3*  --   --  37.7* 36.8*  PLT  --   --  318  --   --  366 416*  LABPROT 16.4*  --  17.4*  --   --  18.3* 20.0*  INR 1.31  --  1.42  --   --  1.51* 1.70*  HEPARINUNFRC  --   < >  --   < > 0.37 0.51 0.28*  CREATININE  --   --  1.72*  --   --  1.99* 1.42*  TROPONINI 0.16*  --   --   --   --   --   --   < > = values in this interval not displayed.    Assessment: 44yo male now subtherapeutic on heparin after two levels at goal.  Goal of Therapy:  Heparin level 0.3-0.7 units/ml   Plan:  Will increase heparin gtt by 1-2 units/hr to 1700 units/hr and check level in 6hr.  Vernard Gambles, PharmD, BCPS  10/16/2014,5:42 AM

## 2014-10-17 ENCOUNTER — Other Ambulatory Visit: Payer: Self-pay | Admitting: Cardiology

## 2014-10-17 DIAGNOSIS — N183 Chronic kidney disease, stage 3 unspecified: Secondary | ICD-10-CM

## 2014-10-17 LAB — CULTURE, BLOOD (ROUTINE X 2)
Culture: NO GROWTH
Culture: NO GROWTH

## 2014-10-17 LAB — HEPARIN LEVEL (UNFRACTIONATED): HEPARIN UNFRACTIONATED: 0.44 [IU]/mL (ref 0.30–0.70)

## 2014-10-17 LAB — BASIC METABOLIC PANEL
Anion gap: 14 (ref 5–15)
BUN: 33 mg/dL — AB (ref 6–20)
CALCIUM: 8.8 mg/dL — AB (ref 8.9–10.3)
CO2: 23 mmol/L (ref 22–32)
Chloride: 98 mmol/L — ABNORMAL LOW (ref 101–111)
Creatinine, Ser: 1.44 mg/dL — ABNORMAL HIGH (ref 0.61–1.24)
GFR calc Af Amer: >60 mL/min (ref >60)
GFR calc non Af Amer: 58 mL/min — ABNORMAL LOW (ref >60)
Glucose, Bld: 97 mg/dL (ref 65–99)
Potassium: 4.6 mmol/L (ref 3.5–5.1)
SODIUM: 135 mmol/L (ref 135–145)

## 2014-10-17 LAB — CBC
HEMATOCRIT: 38.1 % — AB (ref 39.0–52.0)
Hemoglobin: 13.6 g/dL (ref 13.0–17.0)
MCH: 30.8 pg (ref 26.0–34.0)
MCHC: 35.7 g/dL (ref 30.0–36.0)
MCV: 86.4 fL (ref 78.0–100.0)
Platelets: 444 10*3/uL — ABNORMAL HIGH (ref 150–400)
RBC: 4.41 MIL/uL (ref 4.22–5.81)
RDW: 15.7 % — AB (ref 11.5–15.5)
WBC: 14.6 10*3/uL — AB (ref 4.0–10.5)

## 2014-10-17 LAB — PROTIME-INR
INR: 2.13 — ABNORMAL HIGH (ref 0.00–1.49)
PROTHROMBIN TIME: 23.6 s — AB (ref 11.6–15.2)

## 2014-10-17 LAB — BRAIN NATRIURETIC PEPTIDE: B Natriuretic Peptide: 1091.1 pg/mL — ABNORMAL HIGH (ref 0.0–100.0)

## 2014-10-17 MED ORDER — WARFARIN SODIUM 10 MG PO TABS
10.0000 mg | ORAL_TABLET | Freq: Once | ORAL | Status: AC
  Administered 2014-10-17: 10 mg via ORAL
  Filled 2014-10-17: qty 1

## 2014-10-17 NOTE — Progress Notes (Signed)
1536 Pt verbalized that he does not to discharge himself against medical as previously decided to do.ade Dr . Natale Milch aware. No further orders given this time

## 2014-10-17 NOTE — Progress Notes (Signed)
Family Medicine Teaching Service Daily Progress Note Intern Pager: [phone]  Patient name: Robert Booth Medical record number: [id number] Date of birth: 28-Aug-1970 Age: 44 y.o. Gender: male  Primary Care Provider: No primary care provider on file. Consultants: Cardiology Code Status: FULL  Assessment and Plan: Robert Booth is a 44 y.o. male presenting with cough, SOB, hemoptysis and pleuritic chest pain. PMH is significant for HTN, dilated cardiomyopathy (NICM, reduced EF), alcoholism, and anxiety.   DOE / Hemoptysis, presumed RLL CAP Cough beginning two weeks ago with increasing SOB / DOE and hemoptysis observed on admission. Also reported worsening pleuritic R flank, back pain. Most likely CAP given imaging (CXR then confirmed on CTA with RLL consolidation likely pneumonia less likely infarct) despite lack of systemic symptoms, afebrile, no hypoxia. Received one dose ctx and Duoneb in ED. Denies fevers. WBC 12.4 on admission. Differential includes initial high concern for PE (Well's score 4, active LV thrombus and off Coumadin x 1 week INR 1.2, however Chest CTA w/o evidence acute PE, can't r/o distal PE), ?malignancy (h/o tobacco).  - Completed course of azithro - Repeat CXR shows increased opacity in RLL consistent with PNA - Lactic Acid trended down 2.54 > 2.1 >1.6 - BCx - no growth x 4 days - Norco for chest pain - Awaiting prior medical records from [facility] in [place] - Monitor INR, can d/c patient when INR therapeutic  - Continue heparin bridge - INR 2.13 today   Presumed Acute on Chronic Systolic CHF / Dilated NICM Dx in 04/2014, prior ECHO reduced LVEF 20% (per Cards, no record of ECHO in [place]), s/p cardiac cath in 2016 showed normal coronary arteries. Dx LV thrombus in 03/16. Missed three doses of Lasix last week, also stopped taking coumadin last week. Positive orthopnea. Additionally, no active chest pressure/tightness or typical anginal symptoms, 43 yr w/o CAD, ACS is highly  unlikely, suspect troponin related to demand ischemia. - Elevated BNP 879 but may be consistently elevated due to dilated cardiomyopathy (no comparison lab) - Repeat troponins remain 0.21 as on admission - EKG - sinus tach with T wave abnormality; repeat EKG same findings as initial - Strict I/O, Daily weights - Echo (07/14) - large chronic-appearing partially calcified thrombus, EF 20% - Patient reports he sees Dr. Antoine Poche for cards f/u in [place] and had appointment today to check INR - Resume Lasix PO 40 mg today to help with edema and bloating   Vision change: reports "un-focused but not blurry" vision for two days prior to presentation. Reports closing either eye improves vision.  - Continue to monitor for new or improving vision changes  AKI: Elevated SCr 1.56 (0.95 in 2014)  - IVF - Continue to monitor - Per cards, resume ARB - Cr improved to 1.44 (was 1.99 two days ago)  Hypokalemia: Resolved. -K improved to 4.6 today  -Monitor   HTN On admit, low BP with SBP 90-100s. Gradually improving. Concern with low BP and significant amount of pain initially. BP improved with initial IVF in ED, continues to improve. No evidence hypotension, concern for sepsis with CAP but no evidence severe sepsis. - Home losartan, metoprolol  Anxiety: not currently medically treated  Alcohol Abuse: last drink before admission -D/c CIWA  Constipation: Resolved. -Miralax  FEN/GI: heart healthy diet  Prophylaxis: continue heparin bridge  Disposition: Home  Subjective:  Robert Booth reports feeling very bloated this morning. He also reports difficulty breathing when lying flat, and says he had to sleep and now has to sit with  the head of the bed upright so he can breath. He says he is still coughing up bloody sputum, but not as much as before.   Objective: Temp:  [97.5 F (36.4 C)-98.7 F (37.1 C)] 98.7 F (37.1 C) (07/18 0549) Pulse Rate:  [96-102] 96 (07/18 0549) Resp:  [18-20] 20  (07/18 0549) BP: (110-125)/(81-91) 115/81 mmHg (07/18 0549) SpO2:  [97 %-100 %] 100 % (07/18 0549) Weight:  [199 lb 14.4 oz (90.674 kg)] 199 lb 14.4 oz (90.674 kg) (07/18 0549) Physical Exam: General: male sitting up in bed in NAD Cardiovascular: RRR, no murmurs appreciated Respiratory: CTAB, no wheezes/crackles Abdomen: soft, non-tender, non-distended, +BS MSK: no pain on palpation of L flank or shoulder Extremities: 1+ pitting edema LE bilaterally  Laboratory:  Recent Labs Lab 10/15/14 0308 10/16/14 0407 10/17/14 0438  WBC 16.8* 13.6* 14.6*  HGB 13.7 13.2 13.6  HCT 37.7* 36.8* 38.1*  PLT 366 416* 444*    Recent Labs Lab 10/12/14 1710  10/15/14 0308 10/16/14 0407 10/17/14 0438  NA 136  < > 132* 133* 135  K 3.4*  < > 3.6 3.6 4.6  CL 101  < > 98* 97* 98*  CO2 22  < > 21* 25 23  BUN 22*  < > 37* 33* 33*  CREATININE 1.56*  < > 1.99* 1.42* 1.44*  CALCIUM 8.3*  < > 8.6* 8.4* 8.8*  PROT 6.5  --   --   --   --   BILITOT 1.3*  --   --   --   --   ALKPHOS 85  --   --   --   --   ALT 167*  --   --   --   --   AST 129*  --   --   --   --   GLUCOSE 105*  < > 105* 96 97  < > = values in this interval not displayed.   [name], MD 10/17/2014, 9:42 AM PGY-1,  Family Medicine FPTS Intern pager: [phone], text pages welcome

## 2014-10-17 NOTE — Progress Notes (Signed)
SUBJECTIVE:   No SOB   PHYSICAL EXAM Filed Vitals:   10/16/14 1415 10/16/14 1953 10/17/14 0549 10/17/14 1029  BP: 125/91 110/82 115/81 121/85  Pulse: 100 102 96 99  Temp: 97.5 F (36.4 C) 97.6 F (36.4 C) 98.7 F (37.1 C)   TempSrc: Oral Oral Oral   Resp: Height:      Weight:   199 lb 14.4 oz (90.674 kg)   SpO2: 100% 97% 100% 98%   General:  No distress Lungs:  Decreased breath sounds, few crackles Rt base Heart:  RRR, tachy  Abdomen:  Positive bowel sounds, no rebound no guarding Extremities:  No edema  LABS: Lab Results  Component Value Date   TROPONINI 0.16* 10/13/2014   Results for orders placed or performed during the hospital encounter of 10/12/14 (from the past 24 hour(s))  Heparin level (unfractionated)     Status: None   Collection Time: 10/16/14  2:09 PM  Result Value Ref Range   Heparin Unfractionated 0.57 0.30 - 0.70 IU/mL  Heparin level (unfractionated)     Status: None   Collection Time: 10/16/14  9:00 PM  Result Value Ref Range   Heparin Unfractionated 0.49 0.30 - 0.70 IU/mL  Heparin level (unfractionated)     Status: None   Collection Time: 10/17/14  4:38 AM  Result Value Ref Range   Heparin Unfractionated 0.44 0.30 - 0.70 IU/mL  CBC     Status: Abnormal   Collection Time: 10/17/14  4:38 AM  Result Value Ref Range   WBC 14.6 (H) 4.0 - 10.5 K/uL   RBC 4.41 4.22 - 5.81 MIL/uL   Hemoglobin 13.6 13.0 - 17.0 g/dL   HCT 16.1 (L) 09.6 - 04.5 %   MCV 86.4 78.0 - 100.0 fL   MCH 30.8 26.0 - 34.0 pg   MCHC 35.7 30.0 - 36.0 g/dL   RDW 40.9 (H) 81.1 - 91.4 %   Platelets 444 (H) 150 - 400 K/uL  Basic metabolic panel     Status: Abnormal   Collection Time: 10/17/14  4:38 AM  Result Value Ref Range   Sodium 135 135 - 145 mmol/L   Potassium 4.6 3.5 - 5.1 mmol/L   Chloride 98 (L) 101 - 111 mmol/L   CO2 23 22 - 32 mmol/L   Glucose, Bld 97 65 - 99 mg/dL   BUN 33 (H) 6 - 20 mg/dL   Creatinine, Ser 7.82 (H) 0.61 - 1.24 mg/dL   Calcium 8.8  (L) 8.9 - 10.3 mg/dL   GFR calc non Af Amer 58 (L) >60 mL/min   GFR calc Af Amer >60 >60 mL/min   Anion gap 14 5 - 15  Brain natriuretic peptide     Status: Abnormal   Collection Time: 10/17/14  4:38 AM  Result Value Ref Range   B Natriuretic Peptide 1091.1 (H) 0.0 - 100.0 pg/mL  Protime-INR     Status: Abnormal   Collection Time: 10/17/14  4:38 AM  Result Value Ref Range   Prothrombin Time 23.6 (H) 11.6 - 15.2 seconds   INR 2.13 (H) 0.00 - 1.49    Intake/Output Summary (Last 24 hours) at 10/17/14 1205 Last data filed at 10/17/14 1100  Gross per 24 hour  Intake   1680 ml  Output    850 ml  Net    830 ml    ASSESSMENT AND PLAN:  ACUTE ON CHRONIC SYSTOLIC HF: Non compliance with medications as an OP. I/O  positive 1L since admission  And wgt up 5 lbs ??  CKD:   Creat improved. Off lasix and ACE inhibitor. Consider restarting ARB at discharge - he report intolerance to ACE-I (listed as allergy).  MURAL THROMBUS:  On heparin and warfarin - INR 2.13 today.  RLL CAP.    Plan: ? Rx of RLL CAP- Zithromax Rx noted but not ordered, presumable secondary to Coumadin interaction. Will defer Rx to primary service.  He does not look toxic. We will arrange TOC OV in 7-10 days and f/u INR and BMP this week if discharged.    Corine Shelter K PA 10/17/2014 12:05 PM

## 2014-10-17 NOTE — Progress Notes (Signed)
ANTICOAGULATION CONSULT NOTE - Follow Up Consult  Pharmacy Consult for Warfarin   Indication: LV thrombus  Allergies  Allergen Reactions  . Celexa [Citalopram Hydrobromide] Other (See Comments)    DISORIENTED  . Lisinopril Cough    Patient Measurements: Height: 5\' 9"  (175.3 cm) Weight: 199 lb 14.4 oz (90.674 kg) IBW/kg (Calculated) : 70.7   Vital Signs: Temp: 98.7 F (37.1 C) (07/18 0549) Temp Source: Oral (07/18 0549) BP: 121/85 mmHg (07/18 1029) Pulse Rate: 99 (07/18 1029)  Labs:  Recent Labs  10/15/14 0308 10/16/14 0407 10/16/14 1409 10/16/14 2100 10/17/14 0438  HGB 13.7 13.2  --   --  13.6  HCT 37.7* 36.8*  --   --  38.1*  PLT 366 416*  --   --  444*  LABPROT 18.3* 20.0*  --   --  23.6*  INR 1.51* 1.70*  --   --  2.13*  HEPARINUNFRC 0.51 0.28* 0.57 0.49 0.44  CREATININE 1.99* 1.42*  --   --  1.44*    Estimated Creatinine Clearance: 72.9 mL/min (by C-G formula based on Cr of 1.44).   Medications:  Scheduled:  . aspirin EC  81 mg Oral Daily  . folic acid  1 mg Oral Daily  . furosemide  40 mg Oral Daily  . metoprolol succinate  100 mg Oral Daily  . multivitamin with minerals  1 tablet Oral Daily  . polyethylene glycol  17 g Oral Daily  . sodium chloride  3 mL Intravenous Q12H  . thiamine  100 mg Oral Daily   Or  . thiamine  100 mg Intravenous Daily  . warfarin  10 mg Oral ONCE-1800  . Warfarin - Pharmacist Dosing Inpatient   Does not apply q1800   Infusions:    PRN: acetaminophen **OR** acetaminophen, benzonatate, HYDROcodone-acetaminophen, morphine injection, ondansetron **OR** ondansetron (ZOFRAN) IV, phenol  Assessment: 43 YOM continuing on heparin bridge + coumadin (pta) for LV thrombus. Admitted with hemoptysis, SOB and cough. INR 1.2 on admission due to noncompliance. Coumadin held 7/13 due to hemoptysis. Heparin bridge added 7/14, stopped by FPTS on 7/18 now that INR is therapeutic.  PTA warfarin dose: 10 mg daily  INR increasing, now  therapeutic 1.51>>1.7>>2.13 Hg stable wnl, plt up to 444  Hemoptysis still noted but reportedly minimal in comparison to previous days - Will monitor for worsening - per MD ok to continue anticoagulation.  Goal of Therapy:  INR 2-3 Heparin level 0.3-0.7 units/ml Monitor platelets by anticoagulation protocol: Yes   Plan:  - coumadin 10mg  x 1 - Daily INR/PT - Monitor for s/sx of bleeding and worsening hemoptysis   Estella Husk, Pharm.D., BCPS, AAHIVP Clinical Pharmacist Phone: [phone] or [phone] 10/17/2014, 1:48 PM

## 2014-10-17 NOTE — Progress Notes (Signed)
Talked to patient about DCP; patient goes to the [facility] for follow up care and he also gets his prescriptions filled there also. He has a "[employer]" from the Clinic and get the majority of his meds for free. His follow up apt is July 29,2016 at 3:15 pm; Alexis Goodell [phone]

## 2014-10-18 LAB — PROTIME-INR
INR: 2.44 — ABNORMAL HIGH (ref 0.00–1.49)
Prothrombin Time: 26.2 seconds — ABNORMAL HIGH (ref 11.6–15.2)

## 2014-10-18 MED ORDER — WARFARIN SODIUM 10 MG PO TABS
10.0000 mg | ORAL_TABLET | Freq: Once | ORAL | Status: AC
  Administered 2014-10-18: 10 mg via ORAL
  Filled 2014-10-18: qty 1

## 2014-10-18 MED ORDER — FUROSEMIDE 10 MG/ML IJ SOLN
80.0000 mg | Freq: Once | INTRAMUSCULAR | Status: AC
  Administered 2014-10-18: 80 mg via INTRAVENOUS
  Filled 2014-10-18: qty 8

## 2014-10-18 NOTE — Progress Notes (Signed)
ANTICOAGULATION CONSULT NOTE - Follow Up Consult  Pharmacy Consult for Warfarin Indication: LV Thrombus  Allergies  Allergen Reactions  . Celexa [Citalopram Hydrobromide] Other (See Comments)    DISORIENTED  . Lisinopril Cough    Patient Measurements: Height: 5\' 9"  (175.3 cm) Weight: 198 lb 3.2 oz (89.903 kg) IBW/kg (Calculated) : 70.7   Vital Signs: Temp: 98 F (36.7 C) (07/19 1005) Temp Source: Oral (07/19 1005) BP: 128/86 mmHg (07/19 1005) Pulse Rate: 93 (07/19 1005)  Labs:  Recent Labs  10/16/14 0407 10/16/14 1409 10/16/14 2100 10/17/14 0438 10/18/14 0404  HGB 13.2  --   --  13.6  --   HCT 36.8*  --   --  38.1*  --   PLT 416*  --   --  444*  --   LABPROT 20.0*  --   --  23.6* 26.2*  INR 1.70*  --   --  2.13* 2.44*  HEPARINUNFRC 0.28* 0.57 0.49 0.44  --   CREATININE 1.42*  --   --  1.44*  --     Estimated Creatinine Clearance: 72.6 mL/min (by C-G formula based on Cr of 1.44).   Medications:  Scheduled:  . aspirin EC  81 mg Oral Daily  . folic acid  1 mg Oral Daily  . metoprolol succinate  100 mg Oral Daily  . multivitamin with minerals  1 tablet Oral Daily  . polyethylene glycol  17 g Oral Daily  . sodium chloride  3 mL Intravenous Q12H  . thiamine  100 mg Oral Daily   Or  . thiamine  100 mg Intravenous Daily  . Warfarin - Pharmacist Dosing Inpatient   Does not apply q1800    Assessment: 43 YOM continuing on heparin bridge + coumadin (pta) for LV thrombus. Admitted with hemoptysis, SOB and cough. INR 1.2 on admission due to noncompliance. Coumadin held 7/13 due to hemoptysis. Heparin bridge added 7/14, stopped by FPTS on 7/18 now that INR is therapeutic.  PTA warfarin dose: 10 mg daily  INR increasing, now therapeutic 1.51>>1.7>>2.13>2.44 Hg stable wnl, plt up to 444 Goal of Therapy:  INR 2-3 Monitor platelets by anticoagulation protocol: Yes   Plan:  -Warfarin 10 mg x 1 dose tonight -Daily INR while on Coumadin -Monitor for s/sx  bleeding  Elige Ko, PharmD, BCPS 10/18/2014,12:24 PM

## 2014-10-18 NOTE — Clinical Documentation Improvement (Signed)
MD's, NP's, and PA's  Noted the following lab values GFR (on admit >60/>54/>45/>60/>60) Creatinine (on admit1.35/1.72/1.99/1.42/1.44) and documentation of "CKD" without a stage, if appropriate please provide further specificity of diagnosis.  Thank you    Possible Clinical Conditions?   CKD Stage II - GFR 60-80 CKD Stage III - GFR 30-59 CKD Stage IV - GFR 15-29 CKD Stage V - GFR < 15 Other condition Cannot Clinically determine     Risk Factors: PNA, HTN, ACUTE ON CHRONIC SYSTOLIC HEART FAILURE  Diagnostics: BMET DAILY  Treatment: MONITORED, MEDICATION ADJUSTMENT  Thank You, Lavonda Jumbo ,RN Clinical Documentation Specialist:  [phone]  [facility]- Health Information Management

## 2014-10-18 NOTE — Progress Notes (Signed)
Family Medicine Teaching Service Daily Progress Note Intern Pager: [phone]  Patient name: Robert Booth Medical record number: [id number] Date of birth: 04/29/1970 Age: 44 y.o. Gender: male  Primary Care Provider: No primary care provider on file. Consultants: Cardiology Code Status: FULL  Assessment and Plan: Robert Booth is a 44 y.o. male presenting with cough, SOB, hemoptysis and pleuritic chest pain. PMH is significant for HTN, dilated cardiomyopathy (NICM, reduced EF), alcoholism, and anxiety.   DOE / Hemoptysis, presumed RLL CAP Cough beginning two weeks ago with increasing SOB / DOE and hemoptysis observed on admission. Also reported worsening pleuritic R flank, back pain. Most likely CAP given imaging (CXR then confirmed on CTA with RLL consolidation likely pneumonia less likely infarct) despite lack of systemic symptoms, afebrile, no hypoxia. WBC 12.4 on admission. Differential includes initial high concern for PE (Well's score 4, active LV thrombus and off Coumadin x 1 week INR 1.2, however Chest CTA w/o evidence acute PE, can't r/o distal PE), ?malignancy (h/o tobacco).  - Completed 5 day course of azithro (Z-pack) and 3 doses of CTX - Repeat CXR shows increased opacity in RLL consistent with PNA - Lactic Acid trended down 2.54 > 2.1 >1.6 - BCx - no growth x 5 days - Norco for chest pain - D/ced heparin bridge; on home warfarin now - INR 2.44 today  - One dose IV Lasix 40 mg now, monitor for improved orthopnea then possible d/c this afternoon  Presumed Acute on Chronic Systolic CHF / Dilated NICM Dx in 04/2014, prior ECHO reduced LVEF 20% (per Cards, no record of ECHO in [place]), s/p cardiac cath in 2016 showed normal coronary arteries. Dx LV thrombus in 03/16. Missed three doses of Lasix week prior to admission, also stopped taking coumadin then. Additionally, no active chest pressure/tightness or typical anginal symptoms, 43 yr w/o CAD, ACS is highly unlikely. - Elevated  BNP 879 but may be consistently elevated due to dilated cardiomyopathy (no comparison lab) - EKG - sinus tach with T wave abnormality; repeat EKG same findings as initial - Strict I/O, Daily weights - Echo (07/14) - large chronic-appearing partially calcified thrombus, EF 20% - Patient reports he sees Dr. Antoine Booth for cards f/u   Vision change: reports "un-focused but not blurry" vision for two days prior to presentation. Reports closing either eye improves vision.  - Continue to monitor for new or improving vision changes  AKI: Elevated SCr 1.56 (0.95 in 2014)  - IVF - Continue to monitor - Per cards, resume ARB - Cr improved to 1.44 (was 1.99 three days ago)  Hypokalemia: Resolved. -K improved to 4.6 -Monitor   HTN On admit, low BP with SBP 90-100s. Gradually improving. Concern with low BP and significant amount of pain initially. BP improved with initial IVF in ED, continues to improve. No evidence hypotension, concern for sepsis with CAP but no evidence severe sepsis. - Home losartan, metoprolol  Anxiety: not currently medically treated  Alcohol Abuse: last drink before admission -D/c CIWA  Constipation: Resolved. -Miralax  FEN/GI: heart healthy diet  Prophylaxis: continue heparin bridge  Disposition: Home  Subjective:  Robert Booth reports feeling very bloated overnight, even after resuming Lasix yesterday. He also reports difficulty breathing when lying flat, and says he had to sleep and now has to sit with the head of the bed upright so he could breath. When episodes of SOB began last night, he said he felt his heart racing and began to feel dizzy as well. He reports he is  still coughing up minimal bloody sputum.   Objective: Temp:  [97.3 F (36.3 C)-98 F (36.7 C)] 98 F (36.7 C) (07/19 1005) Pulse Rate:  [93-103] 93 (07/19 1005) Resp:  [18-21] 20 (07/19 1005) BP: (113-128)/(85-91) 128/86 mmHg (07/19 1005) SpO2:  [97 %-100 %] 97 % (07/19 1005) Weight:  [198  lb 3.2 oz (89.903 kg)] 198 lb 3.2 oz (89.903 kg) (07/19 0523) Physical Exam: General: male sitting up in bed in NAD Cardiovascular: RRR, no murmurs appreciated Respiratory: CTAB, no wheezes/crackles Abdomen: soft, non-tender, non-distended, +BS MSK: no pain on palpation of L flank or shoulder Extremities: still present but improving pitting edema LE bilaterally  Laboratory:  Recent Labs Lab 10/15/14 0308 10/16/14 0407 10/17/14 0438  WBC 16.8* 13.6* 14.6*  HGB 13.7 13.2 13.6  HCT 37.7* 36.8* 38.1*  PLT 366 416* 444*    Recent Labs Lab 10/12/14 1710  10/15/14 0308 10/16/14 0407 10/17/14 0438  NA 136  < > 132* 133* 135  K 3.4*  < > 3.6 3.6 4.6  CL 101  < > 98* 97* 98*  CO2 22  < > 21* 25 23  BUN 22*  < > 37* 33* 33*  CREATININE 1.56*  < > 1.99* 1.42* 1.44*  CALCIUM 8.3*  < > 8.6* 8.4* 8.8*  PROT 6.5  --   --   --   --   BILITOT 1.3*  --   --   --   --   ALKPHOS 85  --   --   --   --   ALT 167*  --   --   --   --   AST 129*  --   --   --   --   GLUCOSE 105*  < > 105* 96 97  < > = values in this interval not displayed.   [name], MD 10/18/2014, 12:04 PM PGY-1, [facility] Family Medicine FPTS Intern pager: [phone], text pages welcome

## 2014-10-19 LAB — PROTIME-INR
INR: 3.77 — ABNORMAL HIGH (ref 0.00–1.49)
PROTHROMBIN TIME: 36.3 s — AB (ref 11.6–15.2)

## 2014-10-19 MED ORDER — METOPROLOL SUCCINATE ER 100 MG PO TB24: 100.0000 mg | ORAL_TABLET | Freq: Every day | ORAL | Status: DC

## 2014-10-19 MED ORDER — WARFARIN SODIUM 7.5 MG PO TABS: 7.5000 mg | ORAL_TABLET | Freq: Every day | ORAL | Status: DC

## 2014-10-19 NOTE — Progress Notes (Addendum)
ANTICOAGULATION CONSULT NOTE - Follow Up Consult  Pharmacy Consult for Warfarin Indication: LV Thrombus  Allergies  Allergen Reactions  . Celexa [Citalopram Hydrobromide] Other (See Comments)    DISORIENTED  . Lisinopril Cough    Patient Measurements: Height: 5\' 9"  (175.3 cm) Weight: 198 lb 1.6 oz (89.858 kg) (Scale A) IBW/kg (Calculated) : 70.7   Vital Signs: Temp: 98.4 F (36.9 C) (07/20 1010) Temp Source: Oral (07/20 1010) BP: 128/90 mmHg (07/20 1010) Pulse Rate: 94 (07/20 1010)  Labs:  Recent Labs  10/16/14 1409 10/16/14 2100 10/17/14 0438 10/18/14 0404 10/19/14 0319  HGB  --   --  13.6  --   --   HCT  --   --  38.1*  --   --   PLT  --   --  444*  --   --   LABPROT  --   --  23.6* 26.2* 36.3*  INR  --   --  2.13* 2.44* 3.77*  HEPARINUNFRC 0.57 0.49 0.44  --   --   CREATININE  --   --  1.44*  --   --     Estimated Creatinine Clearance: 72.6 mL/min (by C-G formula based on Cr of 1.44).   Medications:  Scheduled:  . aspirin EC  81 mg Oral Daily  . folic acid  1 mg Oral Daily  . metoprolol succinate  100 mg Oral Daily  . multivitamin with minerals  1 tablet Oral Daily  . polyethylene glycol  17 g Oral Daily  . sodium chloride  3 mL Intravenous Q12H  . thiamine  100 mg Oral Daily   Or  . thiamine  100 mg Intravenous Daily  . Warfarin - Pharmacist Dosing Inpatient   Does not apply q1800    Assessment: 43 YOM continuing on heparin bridge + coumadin (pta) for LV thrombus. Admitted with hemoptysis, SOB and cough. INR 1.2 on admission due to noncompliance. Coumadin held 7/13 due to hemoptysis. Heparin bridge added 7/14, stopped by FPTS on 7/18 now that INR is therapeutic.  PTA warfarin dose: 10 mg daily  INR increasing now supratherapeutic.  Was continued on home regimen but likely this dose was not appropriate for him as he was noncompliant.  He was also on course of antibiotics which could cause increase in INR.  May dc today  Goal of Therapy:  INR  2-3 Monitor platelets by anticoagulation protocol: Yes   Plan:  -Hold coumadin for 2 days -Restart with 7.5 mg daily, or 5 mg alt with 10 mg if he has 10 mg tabs at home -f/u INR -Monitor for s/sx bleeding  Pelagia Iacobucci, Haskel Schroeder, PharmD 10/19/2014,11:19 AM

## 2014-10-19 NOTE — Progress Notes (Signed)
Pt discharge education and instructions completed with pt and mother at bedside; both voices understanding and denies any questions. Pt IV and telemetry removed; pt handed his prescriptions for coumadin and metoprolol by MD. Pt discharge home with mother to transport him home. Pt offered wheelchair but refuses and ambulated off unit. Pt ambulated off unit with mother and belongings to the side. Arabella Merles Shantavia Jha RN.

## 2014-10-19 NOTE — Discharge Instructions (Signed)
You were hospitalized for pneumonia and worsening of your heart failure. Please continue to take your home medications EVERY DAY as prescribed before your hospitalization. It is very important to take these daily so that fluid will not build up and cause you more chest pain and difficulty breathing.  Your INR is slightly higher than it should be, so do not take any warfarin for the next two days. After that, begin taking the 7.5 mg warfarin I am prescribing. It is VERY IMPORTANT for you to see your cardiology as soon as possible to have your INR re-measured. Please make sure you are seen by Friday if at all possible.

## 2014-10-21 ENCOUNTER — Ambulatory Visit (INDEPENDENT_AMBULATORY_CARE_PROVIDER_SITE_OTHER): Payer: No Typology Code available for payment source

## 2014-10-21 DIAGNOSIS — I213 ST elevation (STEMI) myocardial infarction of unspecified site: Secondary | ICD-10-CM

## 2014-10-21 DIAGNOSIS — Z5181 Encounter for therapeutic drug level monitoring: Secondary | ICD-10-CM

## 2014-10-21 DIAGNOSIS — I513 Intracardiac thrombosis, not elsewhere classified: Secondary | ICD-10-CM

## 2014-10-21 LAB — POCT INR: INR: 3.6

## 2014-10-27 ENCOUNTER — Encounter: Payer: Self-pay | Admitting: Physician Assistant

## 2014-10-27 ENCOUNTER — Telehealth: Payer: Self-pay | Admitting: Physician Assistant

## 2014-10-27 NOTE — Progress Notes (Addendum)
Cardiology Office Note Date:  10/28/2014  Patient ID:  Robert Booth, DOB [birth year], MRN [id number] PCP:  No PCP Per Patient  Cardiologist:  Hochrein  Chief Complaint: follow-up hospitalization for pneumonia  History of Present Illness: Robert Booth is a 44 y.o. male with history of NICM, LV thrombus (dx 06/2013), HTN, prior alocholism (quit 05/2014) who presents for follow-up of recent hospitalization for PNA. His prior care was in [place] and some of his records can be found under the media tab. 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 He was diagnosed with LV thrombus in 07/2014 and started on Coumadin. He was recently readmitted 7/13-7/19 with cough, SOB and chest pain. He had missed 3 doses of his Coumadin as well as Lasix prior to admission, at which time his cough started. He then stopped taking his coumadin at that time because he was worried one of his organs would rupture if he remained on coumadin when coughing so much. He was admitted and diagnosed with RLL PNA. 2D Echo 10/13/14: EF 20%, large chronic appearing partially calcified mural apical and apical lateral thrombus, mod LAE. Lasix was initially held due to AKI (possibly indicative of CKD), but then he did require a dose of IV Lasix. It was felt he would likely need advanced therapies in the future if he could demonstrate compliance with meds and follow up. Discharge Cr was 1.44, only prior to compare to was 0.95 in 2014. He had received 5 days of azithromycin and ceftriaxone.  He reports he is still having problems with coughing, particularly at night. It makes his right side feel sore. He still has scant pink hemoptysis in the morning. No fevers, chills or night sweats. Weight is stable per patient (down a few lbs by our scale). No travel outside the  country. No exertional chest pain. He does report pleuritic chest discomfort. No significant SOB. Denies any alcohol or illicit drug use. Reports compliance with meds. No further LEE.  Past Medical History  Diagnosis Date  . Hypertension   . Alcoholism     a. Sober since Feb 2016.  Marland Kitchen Anxiety   . NICM (nonischemic cardiomyopathy)     a. 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  . LV (left ventricular) mural thrombus   . Acute kidney injury     Past Surgical History  Procedure Laterality Date  . Hernia repair Right Inguinal  . Knee surgery Right 1998    Current Outpatient Prescriptions  Medication Sig Dispense Refill  . aspirin 81 MG EC tablet Take 1 tablet (81 mg total) by mouth daily. Swallow whole. 30 tablet 12  . furosemide (LASIX) 40 MG tablet Take 1 tablet (40 mg total) by mouth daily. 30 tablet 2  . losartan (COZAAR) 50 MG tablet Take 1 tablet (50 mg total) by mouth daily. 90 tablet 3  . metoprolol succinate (TOPROL-XL) 100 MG 24 hr tablet Take 1 tablet (100 mg total) by mouth daily. Take with or immediately following a meal. 30 tablet 0  . warfarin (COUMADIN) 7.5 MG tablet Take 1 tablet (7.5 mg total) by mouth daily. 30 tablet 0   No current facility-administered medications for this visit.  Allergies:   Celexa and Lisinopril   Social History:  The patient  reports that he has quit smoking. His smoking use included Cigarettes. He has quit using smokeless tobacco. His smokeless tobacco use included Chew. He reports that he does not drink alcohol or use illicit drugs.   Family History:  The patient's fam57ily history includes Heart attack (age of onset: 50) in his paternal grandfather; Heart attack (age of onset: 53) in his father; Hypertension in his father, mother, and sister.  ROS:  Please see  the history of present illness.  All other systems are reviewed and otherwise negative.   PHYSICAL EXAM:  VS:  BP 102/78 mmHg  Pulse 104  Ht  (1.753 m)  Wt 186 lb (84.369 kg)  BMI 27.45 kg/m2 BMI: Body mass index is 27.45 kg/(m^2). Well nourished, well developed AAM in no acute distress HEENT: normocephalic, atraumatic Neck: no JVD, carotid bruits or masses Cardiac:  normal S1, S2; RRR with slightly elevated rate; no murmurs, rubs, or gallops Lungs:  Right lower lobe breath sounds decreased, no wheezing, rhonchi or rales Abd: soft, nontender, no hepatomegaly, + BS MS: no deformity or atrophy Ext: no edema Skin: warm and dry, no rash Neuro:  moves all extremities spontaneously, no focal abnormalities noted, follows commands Psych: euthymic mood, full affect   EKG:  Done today shows sinus tach 104bpm, possible LAE, nonspecific T wave changes  Recent Labs: 10/12/2014: ALT 167* 10/13/2014: Magnesium 1.8 10/17/2014: B Natriuretic Peptide 1091.1*; BUN 33*; Creatinine, Ser 1.44*; Hemoglobin 13.6; Platelets 444*; Potassium 4.6; Sodium 135  No results found for requested labs within last 365 days.   Estimated Creatinine Clearance: 65.5 mL/min (by C-G formula based on Cr of 1.44).   Wt Readings from Last 3 Encounters:  10/28/14 186 lb (84.369 kg)  10/19/14 198 lb 1.6 oz (89.858 kg)  08/18/14 195 lb (88.451 kg)     Other studies reviewed: Additional studies/records reviewed today include: summarized above  ASSESSMENT AND PLAN:  1. Recent PNA - he is still symptomatic with this. Completed 5 days of azithromycin/ceftriaxone. D/w Dr. Herbie Baltimore who feels this likely represents pleurisy in the setting of clearing PNA. The patient is afebrile and nontoxic appearing. He is not hypoxic. We will try Hycodan 1 tab po q4hours PRN cough. Will also stop aspirin since he is therapeutic on Coumadin. He needs to follow up with primary care ASAP to follow his PNA to clearance. Will repeat CBC, BMET  and TSH today given persistently elevated HR. ADDENDUM: received word from outpatient pharmacy that they do not stock Hycodan. They do have Robitussin AC which they will fill via verbal prescription - 10mL q4hr PRN cough, disp #6oz with zero refills. 2. NICM/chronic systolic CHF - actually appears euvolemic on exam. No room to titrate meds today. Baseline HR looks like it's been 90s-100s recently. Will refer to AHF clinic. He is agreeable. 3. LV thrombus, on Coumadin - INR will be checked today. Importance of compliance discussed. 4. Recent acute kidney injury, possibly representing CKD - repeat labs today. 5. History of alcoholism - remains abstinent.  Disposition: Will refer to advanced heart failure clinic for consideration of advanced therapies. He would also benefit from follow-up in their clinic from a social work standpoint. It is also not clear to me why he did not have primary care appointment arranged at follow-up from teaching service. Will ask nursing to help arrange appointment at the [facility] and [facility].  Current medicines are reviewed at length  with the patient today.  The patient did not have any concerns regarding medicines.  Thomasene Mohair PA-C 10/28/2014 8:57 AM     [facility] [address], [address] [place], [place] [address] Phone: [phone]

## 2014-10-28 ENCOUNTER — Ambulatory Visit: Payer: Self-pay | Admitting: Physician Assistant

## 2014-10-28 ENCOUNTER — Encounter: Payer: Self-pay | Admitting: Physician Assistant

## 2014-10-28 ENCOUNTER — Ambulatory Visit (INDEPENDENT_AMBULATORY_CARE_PROVIDER_SITE_OTHER): Payer: No Typology Code available for payment source | Admitting: Physician Assistant

## 2014-10-28 ENCOUNTER — Ambulatory Visit (INDEPENDENT_AMBULATORY_CARE_PROVIDER_SITE_OTHER)
Payer: No Typology Code available for payment source | Admitting: Pharmacist Clinician (PhC)/ Clinical Pharmacy Specialist

## 2014-10-28 VITALS — BP 102/78 | HR 104 | Ht 69.0 in | Wt 186.0 lb

## 2014-10-28 DIAGNOSIS — J189 Pneumonia, unspecified organism: Secondary | ICD-10-CM

## 2014-10-28 DIAGNOSIS — Z5181 Encounter for therapeutic drug level monitoring: Secondary | ICD-10-CM

## 2014-10-28 DIAGNOSIS — N179 Acute kidney failure, unspecified: Secondary | ICD-10-CM

## 2014-10-28 DIAGNOSIS — F1021 Alcohol dependence, in remission: Secondary | ICD-10-CM

## 2014-10-28 DIAGNOSIS — I513 Intracardiac thrombosis, not elsewhere classified: Secondary | ICD-10-CM

## 2014-10-28 DIAGNOSIS — R000 Tachycardia, unspecified: Secondary | ICD-10-CM

## 2014-10-28 DIAGNOSIS — I213 ST elevation (STEMI) myocardial infarction of unspecified site: Secondary | ICD-10-CM

## 2014-10-28 DIAGNOSIS — R091 Pleurisy: Secondary | ICD-10-CM

## 2014-10-28 DIAGNOSIS — I428 Other cardiomyopathies: Secondary | ICD-10-CM

## 2014-10-28 DIAGNOSIS — I5022 Chronic systolic (congestive) heart failure: Secondary | ICD-10-CM

## 2014-10-28 DIAGNOSIS — I429 Cardiomyopathy, unspecified: Secondary | ICD-10-CM

## 2014-10-28 LAB — BASIC METABOLIC PANEL
BUN: 17 mg/dL (ref 7–25)
CALCIUM: 9.2 mg/dL (ref 8.6–10.3)
CO2: 26 mmol/L (ref 20–31)
CREATININE: 1.22 mg/dL (ref 0.60–1.35)
Chloride: 99 mmol/L (ref 98–110)
Glucose, Bld: 94 mg/dL (ref 65–99)
Potassium: 4.3 mmol/L (ref 3.5–5.3)
Sodium: 139 mmol/L (ref 135–146)

## 2014-10-28 LAB — T4, FREE: Free T4: 1.19 ng/dL (ref 0.80–1.80)

## 2014-10-28 LAB — CBC
HEMATOCRIT: 39 % (ref 39.0–52.0)
Hemoglobin: 13.1 g/dL (ref 13.0–17.0)
MCH: 30.2 pg (ref 26.0–34.0)
MCHC: 33.6 g/dL (ref 30.0–36.0)
MCV: 89.9 fL (ref 78.0–100.0)
MPV: 11.3 fL (ref 8.6–12.4)
Platelets: 547 10*3/uL — ABNORMAL HIGH (ref 150–400)
RBC: 4.34 MIL/uL (ref 4.22–5.81)
RDW: 15.3 % (ref 11.5–15.5)
WBC: 11.2 10*3/uL — ABNORMAL HIGH (ref 4.0–10.5)

## 2014-10-28 LAB — TSH: TSH: 3.102 u[IU]/mL (ref 0.350–4.500)

## 2014-10-28 LAB — POCT INR: INR: 2.1

## 2014-10-28 MED ORDER — GUAIFENESIN-CODEINE 100-10 MG/5ML PO SOLN: 10.0000 mL | ORAL | Status: DC | PRN

## 2014-10-28 MED ORDER — HYDROCODONE-HOMATROPINE 5-1.5 MG PO TABS: 1.0000 | ORAL_TABLET | ORAL | Status: DC | PRN

## 2014-10-28 MED ORDER — HYDROCODONE-HOMATROPINE 5-1.5 MG PO TABS
1.0000 | ORAL_TABLET | ORAL | Status: DC | PRN
Start: 2014-10-28 — End: 2014-10-28

## 2014-10-28 NOTE — Telephone Encounter (Signed)
Pt notified medication available for pickup at [facility]. He acknowledged information.

## 2014-10-28 NOTE — Telephone Encounter (Signed)
Called pharmacy - pharmacy staff reports patient not yet picked up medication - I requested call back if he picks up today. Gave phone number for our office.

## 2014-10-28 NOTE — Telephone Encounter (Signed)
I received a call from the pharmacist at the [facility] and [facility] who advised me that they did not carry the Hycodan. The pharmacist suggested instead we switch the prescription to Robitussin [facility] because they did in fact have this in stock. The pharmacist took a verbal order for this - 58mL PO q4hours PRN cough, disp #6 ounces with zero refills.   Please relay this info to patient and call his pharmacy to clarify if he picked it up. This needs to be clarified since it is a controlled substance. Thanks!  Herberth Deharo PA-C

## 2014-10-28 NOTE — Telephone Encounter (Signed)
New message      Pt was seen this am by [facility].  He cannot find a pharmacy that carries the cough medicine. Is there something else he can get?

## 2014-10-28 NOTE — Telephone Encounter (Signed)
Got call from patient stating med was not available for pickup.  Called pharmacy to clarify - they state they never received the order - pharmacist did not acknowledge verbal from earlier.  Resubmitted verbal order per Dayna's recommended prescription.  Pharmacy tech acknowledged - will alert patient once filled.  Contacted patient back, gave number to call at pharmacy. He voiced understanding.

## 2014-10-28 NOTE — Addendum Note (Signed)
Addended by: Laurann Montana on: 10/28/2014 10:37 AM   Modules accepted: Orders, Medications

## 2014-10-28 NOTE — Patient Instructions (Addendum)
Your physician has recommended you make the following change in your medication: STOP ASPRIN.  Start new prescription for cough generic cough medication that was given for you today.  Your physician recommends that you return for lab work today. Slips has been provided to you.  You have been referred to Advanced  heart failure clinic.     You have been referred  to [facility]. Appointment needed ASAP.

## 2014-10-31 NOTE — Telephone Encounter (Signed)
Can this encounter be closed?

## 2014-11-04 ENCOUNTER — Ambulatory Visit (INDEPENDENT_AMBULATORY_CARE_PROVIDER_SITE_OTHER): Payer: No Typology Code available for payment source | Admitting: *Deleted

## 2014-11-04 DIAGNOSIS — I513 Intracardiac thrombosis, not elsewhere classified: Secondary | ICD-10-CM

## 2014-11-04 DIAGNOSIS — Z5181 Encounter for therapeutic drug level monitoring: Secondary | ICD-10-CM

## 2014-11-04 DIAGNOSIS — I213 ST elevation (STEMI) myocardial infarction of unspecified site: Secondary | ICD-10-CM

## 2014-11-04 LAB — POCT INR: INR: 3.1

## 2014-11-07 ENCOUNTER — Ambulatory Visit: Payer: Self-pay | Admitting: Physician Assistant

## 2014-11-08 ENCOUNTER — Encounter: Payer: Self-pay | Admitting: Family Medicine

## 2014-11-08 ENCOUNTER — Ambulatory Visit: Payer: Medicaid Other | Attending: Family Medicine | Admitting: Family Medicine

## 2014-11-08 VITALS — BP 142/89 | HR 128 | Temp 98.6°F | Resp 18 | Ht 69.0 in | Wt 194.0 lb

## 2014-11-08 DIAGNOSIS — I5023 Acute on chronic systolic (congestive) heart failure: Secondary | ICD-10-CM

## 2014-11-08 DIAGNOSIS — J189 Pneumonia, unspecified organism: Secondary | ICD-10-CM

## 2014-11-08 DIAGNOSIS — I10 Essential (primary) hypertension: Secondary | ICD-10-CM

## 2014-11-08 DIAGNOSIS — H538 Other visual disturbances: Secondary | ICD-10-CM | POA: Insufficient documentation

## 2014-11-08 DIAGNOSIS — I509 Heart failure, unspecified: Secondary | ICD-10-CM | POA: Insufficient documentation

## 2014-11-08 DIAGNOSIS — Z5181 Encounter for therapeutic drug level monitoring: Secondary | ICD-10-CM

## 2014-11-08 DIAGNOSIS — H539 Unspecified visual disturbance: Secondary | ICD-10-CM

## 2014-11-08 DIAGNOSIS — Z7901 Long term (current) use of anticoagulants: Secondary | ICD-10-CM

## 2014-11-08 DIAGNOSIS — Z87891 Personal history of nicotine dependence: Secondary | ICD-10-CM | POA: Insufficient documentation

## 2014-11-08 LAB — CBC
HEMATOCRIT: 36.2 % — AB (ref 39.0–52.0)
Hemoglobin: 12.2 g/dL — ABNORMAL LOW (ref 13.0–17.0)
MCH: 29.9 pg (ref 26.0–34.0)
MCHC: 33.7 g/dL (ref 30.0–36.0)
MCV: 88.7 fL (ref 78.0–100.0)
MPV: 12 fL (ref 8.6–12.4)
PLATELETS: 368 10*3/uL (ref 150–400)
RBC: 4.08 MIL/uL — AB (ref 4.22–5.81)
RDW: 15.3 % (ref 11.5–15.5)
WBC: 10.1 10*3/uL (ref 4.0–10.5)

## 2014-11-08 LAB — BASIC METABOLIC PANEL
BUN: 18 mg/dL (ref 7–25)
CALCIUM: 8.8 mg/dL (ref 8.6–10.3)
CHLORIDE: 100 mmol/L (ref 98–110)
CO2: 25 mmol/L (ref 20–31)
Creat: 1.39 mg/dL — ABNORMAL HIGH (ref 0.60–1.35)
GLUCOSE: 96 mg/dL (ref 65–99)
Potassium: 4.9 mmol/L (ref 3.5–5.3)
Sodium: 141 mmol/L (ref 135–146)

## 2014-11-08 LAB — POCT INR: INR: 1.7

## 2014-11-08 MED ORDER — LOSARTAN POTASSIUM 50 MG PO TABS: 50.0000 mg | ORAL_TABLET | Freq: Every day | ORAL | Status: DC

## 2014-11-08 MED ORDER — METOPROLOL SUCCINATE ER 100 MG PO TB24: 100.0000 mg | ORAL_TABLET | Freq: Every day | ORAL | Status: DC

## 2014-11-08 MED ORDER — LEVOFLOXACIN 500 MG PO TABS: 500.0000 mg | ORAL_TABLET | Freq: Every day | ORAL | Status: DC

## 2014-11-08 MED ORDER — FUROSEMIDE 40 MG PO TABS: ORAL_TABLET | ORAL | Status: DC

## 2014-11-08 NOTE — Assessment & Plan Note (Signed)
Cough with blood and sputum: at risk for pneumonia given recent hospitalization Repeat CXR ordered Levaquin antibiotic ordered

## 2014-11-08 NOTE — Patient Instructions (Addendum)
Robert Booth,  Thank you for coming in today  1. HTN:  Goal BP is < 140/90 at all times Refilled lasix, take once daily then take an extra 40 mg as needed for weight gain of 3 or more lbs from previous day Continue losartan Continue metoprolol   2. Cough with blood and sputum: at risk for pneumonia given recent hospitalization Repeat CXR ordered Levaquin antibiotic ordered   3. Visual disturbance: referral to optometry placed   levaquin may thin blood while on coumadin Current INR is 1.7  Come for INR check here on Thursday to and again on Friday to monitor INR while taking levaquin  F/u with me in 8 weeks for HTN   Dr. Armen Pickup

## 2014-11-08 NOTE — Assessment & Plan Note (Signed)
Visual disturbance: referral to optometry placed

## 2014-11-08 NOTE — Progress Notes (Signed)
   Subjective:    Patient ID: Robert Booth, male    DOB: 22-Dec-1970, 44 y.o.   MRN: [id number] CC: heart failure and PNA hospital follow up  HPI 44 yo M with HTN presents to re-establish care and discuss the following:  1. Hospital follow up: for pneumonia and CHF exacerbation. Hospitalized from 10/12/14 to 10/18/2014. CHF exacerbation due to noncompliance with lasix. Patient's EF is 20%. He takes 1-2 doses of lasix daily depending on the degree of swelling. Lately he has needed 2 doses, he is now out of lasix. He has ankle swelling. He denies ETOH use.   2. PNA: ED CXR on 10/12/14 showed RLL infiltrate. Repeat CXR revealed worsening RLL infiltrate. Patient was treated with azithromycin initially, then rocephin. He does not have fever. He does have productive cough with white and bloodt sputum. Blood cultures were negative.   3. Blurry vision: off and on for 2 weeks. Vision is sometimes double. No eye trauma. No eye redness or drainage.   History  Substance Use Topics  . Smoking status: Former Smoker    Types: Cigarettes  . Smokeless tobacco: Former Neurosurgeon    Types: Chew     Comment: Was a rare smoker  . Alcohol Use: No   Review of Systems  Constitutional: Negative for fever, chills, fatigue and unexpected weight change.  Eyes: Positive for visual disturbance.  Respiratory: Positive for cough. Negative for shortness of breath.   Cardiovascular: Positive for leg swelling. Negative for chest pain and palpitations.  Gastrointestinal: Negative for nausea, vomiting, abdominal pain, diarrhea, constipation and blood in stool.  Endocrine: Negative for polydipsia, polyphagia and polyuria.  Musculoskeletal: Negative for myalgias, back pain, arthralgias, gait problem and neck pain.  Skin: Negative for rash.  Allergic/Immunologic: Negative for immunocompromised state.  Hematological: Negative for adenopathy. Does not bruise/bleed easily.  Psychiatric/Behavioral: Negative for suicidal ideas, sleep  disturbance and dysphoric mood. The patient is not nervous/anxious.        Objective:   Physical Exam  Constitutional: He appears well-developed and well-nourished. No distress.  HENT:  Head: Normocephalic and atraumatic.  Neck: Normal range of motion. Neck supple.  Cardiovascular: Normal rate, regular rhythm, normal heart sounds and intact distal pulses.   Pulmonary/Chest: Effort normal and breath sounds normal.  Musculoskeletal: He exhibits no edema.  Neurological: He is alert.  Skin: Skin is warm and dry. No rash noted. No erythema.  Psychiatric: He has a normal mood and affect.  BP 142/89 mmHg  Pulse 128  Temp(Src) 98.6 F (37 C) (Oral)  Resp 18  Ht 5\' 9"  (1.753 m)  Wt 194 lb (87.998 kg)  BMI 28.64 kg/m2  SpO2 96% Wt Readings from Last 3 Encounters:  11/08/14 194 lb (87.998 kg)  10/28/14 186 lb (84.369 kg)  10/19/14 198 lb 1.6 oz (89.858 kg)   INR 1.7      Assessment & Plan:

## 2014-11-08 NOTE — Assessment & Plan Note (Signed)
HTN:  Goal BP is < 140/90 at all times Refilled lasix, take once daily then take an extra 40 mg as needed for weight gain of 3 or more lbs from previous day Continue losartan Continue metoprolol

## 2014-11-08 NOTE — Progress Notes (Signed)
Establish Care with PCP HFU pneumonia, heart failure  Stated still productive coughing, sputum white and bloody

## 2014-11-09 ENCOUNTER — Inpatient Hospital Stay (HOSPITAL_COMMUNITY)
Admission: EM | Admit: 2014-11-09 | Discharge: 2014-11-17 | DRG: 291 | Disposition: A | Discharge status: Home Health | Payer: Medicaid Other | Attending: Family Medicine | Admitting: Family Medicine

## 2014-11-09 ENCOUNTER — Encounter (HOSPITAL_COMMUNITY): Payer: Self-pay | Admitting: Emergency Medicine

## 2014-11-09 ENCOUNTER — Emergency Department (HOSPITAL_COMMUNITY): Payer: Medicaid Other

## 2014-11-09 DIAGNOSIS — F1011 Alcohol abuse, in remission: Secondary | ICD-10-CM | POA: Diagnosis present

## 2014-11-09 DIAGNOSIS — I472 Ventricular tachycardia: Secondary | ICD-10-CM | POA: Diagnosis not present

## 2014-11-09 DIAGNOSIS — E871 Hypo-osmolality and hyponatremia: Secondary | ICD-10-CM | POA: Diagnosis present

## 2014-11-09 DIAGNOSIS — I240 Acute coronary thrombosis not resulting in myocardial infarction: Secondary | ICD-10-CM | POA: Diagnosis present

## 2014-11-09 DIAGNOSIS — I129 Hypertensive chronic kidney disease with stage 1 through stage 4 chronic kidney disease, or unspecified chronic kidney disease: Secondary | ICD-10-CM | POA: Diagnosis present

## 2014-11-09 DIAGNOSIS — Z87891 Personal history of nicotine dependence: Secondary | ICD-10-CM

## 2014-11-09 DIAGNOSIS — R042 Hemoptysis: Secondary | ICD-10-CM

## 2014-11-09 DIAGNOSIS — I426 Alcoholic cardiomyopathy: Secondary | ICD-10-CM | POA: Diagnosis present

## 2014-11-09 DIAGNOSIS — Z7901 Long term (current) use of anticoagulants: Secondary | ICD-10-CM | POA: Diagnosis not present

## 2014-11-09 DIAGNOSIS — I513 Intracardiac thrombosis, not elsewhere classified: Secondary | ICD-10-CM

## 2014-11-09 DIAGNOSIS — I509 Heart failure, unspecified: Secondary | ICD-10-CM | POA: Insufficient documentation

## 2014-11-09 DIAGNOSIS — J189 Pneumonia, unspecified organism: Secondary | ICD-10-CM | POA: Diagnosis present

## 2014-11-09 DIAGNOSIS — K7200 Acute and subacute hepatic failure without coma: Secondary | ICD-10-CM | POA: Diagnosis present

## 2014-11-09 DIAGNOSIS — Z79899 Other long term (current) drug therapy: Secondary | ICD-10-CM

## 2014-11-09 DIAGNOSIS — J9600 Acute respiratory failure, unspecified whether with hypoxia or hypercapnia: Secondary | ICD-10-CM | POA: Diagnosis present

## 2014-11-09 DIAGNOSIS — Z888 Allergy status to other drugs, medicaments and biological substances status: Secondary | ICD-10-CM

## 2014-11-09 DIAGNOSIS — I428 Other cardiomyopathies: Secondary | ICD-10-CM

## 2014-11-09 DIAGNOSIS — N183 Chronic kidney disease, stage 3 (moderate): Secondary | ICD-10-CM | POA: Diagnosis present

## 2014-11-09 DIAGNOSIS — N170 Acute kidney failure with tubular necrosis: Secondary | ICD-10-CM | POA: Diagnosis present

## 2014-11-09 DIAGNOSIS — R570 Cardiogenic shock: Secondary | ICD-10-CM | POA: Insufficient documentation

## 2014-11-09 DIAGNOSIS — R791 Abnormal coagulation profile: Secondary | ICD-10-CM | POA: Insufficient documentation

## 2014-11-09 DIAGNOSIS — I420 Dilated cardiomyopathy: Secondary | ICD-10-CM

## 2014-11-09 DIAGNOSIS — I213 ST elevation (STEMI) myocardial infarction of unspecified site: Secondary | ICD-10-CM

## 2014-11-09 DIAGNOSIS — I5023 Acute on chronic systolic (congestive) heart failure: Secondary | ICD-10-CM | POA: Diagnosis present

## 2014-11-09 DIAGNOSIS — D649 Anemia, unspecified: Secondary | ICD-10-CM | POA: Diagnosis present

## 2014-11-09 DIAGNOSIS — R6510 Systemic inflammatory response syndrome (SIRS) of non-infectious origin without acute organ dysfunction: Secondary | ICD-10-CM | POA: Diagnosis present

## 2014-11-09 DIAGNOSIS — Z79891 Long term (current) use of opiate analgesic: Secondary | ICD-10-CM

## 2014-11-09 DIAGNOSIS — Z8249 Family history of ischemic heart disease and other diseases of the circulatory system: Secondary | ICD-10-CM

## 2014-11-09 DIAGNOSIS — E876 Hypokalemia: Secondary | ICD-10-CM | POA: Diagnosis present

## 2014-11-09 DIAGNOSIS — R7989 Other specified abnormal findings of blood chemistry: Secondary | ICD-10-CM | POA: Insufficient documentation

## 2014-11-09 DIAGNOSIS — R231 Pallor: Secondary | ICD-10-CM | POA: Diagnosis present

## 2014-11-09 DIAGNOSIS — F419 Anxiety disorder, unspecified: Secondary | ICD-10-CM | POA: Diagnosis present

## 2014-11-09 DIAGNOSIS — R945 Abnormal results of liver function studies: Secondary | ICD-10-CM

## 2014-11-09 DIAGNOSIS — A419 Sepsis, unspecified organism: Secondary | ICD-10-CM

## 2014-11-09 DIAGNOSIS — F1010 Alcohol abuse, uncomplicated: Secondary | ICD-10-CM

## 2014-11-09 DIAGNOSIS — I10 Essential (primary) hypertension: Secondary | ICD-10-CM

## 2014-11-09 DIAGNOSIS — J9690 Respiratory failure, unspecified, unspecified whether with hypoxia or hypercapnia: Secondary | ICD-10-CM

## 2014-11-09 DIAGNOSIS — N179 Acute kidney failure, unspecified: Secondary | ICD-10-CM | POA: Diagnosis present

## 2014-11-09 LAB — CBC WITH DIFFERENTIAL/PLATELET
BASOS ABS: 0 10*3/uL (ref 0.0–0.1)
BASOS PCT: 0 % (ref 0–1)
EOS ABS: 0 10*3/uL (ref 0.0–0.7)
EOS PCT: 0 % (ref 0–5)
HCT: 34.3 % — ABNORMAL LOW (ref 39.0–52.0)
HEMOGLOBIN: 11.9 g/dL — AB (ref 13.0–17.0)
Lymphocytes Relative: 12 % (ref 12–46)
Lymphs Abs: 1.4 10*3/uL (ref 0.7–4.0)
MCH: 29.8 pg (ref 26.0–34.0)
MCHC: 34.7 g/dL (ref 30.0–36.0)
MCV: 86 fL (ref 78.0–100.0)
MONO ABS: 0.8 10*3/uL (ref 0.1–1.0)
MONOS PCT: 7 % (ref 3–12)
NEUTROS ABS: 8.9 10*3/uL — AB (ref 1.7–7.7)
Neutrophils Relative %: 81 % — ABNORMAL HIGH (ref 43–77)
Platelets: 311 10*3/uL (ref 150–400)
RBC: 3.99 MIL/uL — AB (ref 4.22–5.81)
RDW: 14.9 % (ref 11.5–15.5)
WBC: 11.1 10*3/uL — ABNORMAL HIGH (ref 4.0–10.5)

## 2014-11-09 LAB — TYPE AND SCREEN
ABO/RH(D): B POS
Antibody Screen: NEGATIVE

## 2014-11-09 LAB — CBC
HEMATOCRIT: 34.8 % — AB (ref 39.0–52.0)
HEMOGLOBIN: 12.2 g/dL — AB (ref 13.0–17.0)
MCH: 29.7 pg (ref 26.0–34.0)
MCHC: 35.1 g/dL (ref 30.0–36.0)
MCV: 84.7 fL (ref 78.0–100.0)
Platelets: 317 10*3/uL (ref 150–400)
RBC: 4.11 MIL/uL — ABNORMAL LOW (ref 4.22–5.81)
RDW: 14.9 % (ref 11.5–15.5)
WBC: 17.5 10*3/uL — ABNORMAL HIGH (ref 4.0–10.5)

## 2014-11-09 LAB — TROPONIN I
Troponin I: 0.04 ng/mL — ABNORMAL HIGH (ref <0.031)
Troponin I: 0.05 ng/mL — ABNORMAL HIGH (ref <0.031)
Troponin I: 0.07 ng/mL — ABNORMAL HIGH (ref <0.031)

## 2014-11-09 LAB — MRSA PCR SCREENING: MRSA by PCR: POSITIVE — AB

## 2014-11-09 LAB — BASIC METABOLIC PANEL
Anion gap: 15 (ref 5–15)
BUN: 22 mg/dL — ABNORMAL HIGH (ref 6–20)
CALCIUM: 8.9 mg/dL (ref 8.9–10.3)
CO2: 21 mmol/L — ABNORMAL LOW (ref 22–32)
CREATININE: 1.6 mg/dL — AB (ref 0.61–1.24)
Chloride: 99 mmol/L — ABNORMAL LOW (ref 101–111)
GFR calc non Af Amer: 51 mL/min — ABNORMAL LOW (ref >60)
GFR, EST AFRICAN AMERICAN: 59 mL/min — AB (ref >60)
Glucose, Bld: 94 mg/dL (ref 65–99)
POTASSIUM: 4.1 mmol/L (ref 3.5–5.1)
Sodium: 135 mmol/L (ref 135–145)

## 2014-11-09 LAB — LACTIC ACID, PLASMA: Lactic Acid, Venous: 1.8 mmol/L (ref 0.5–2.0)

## 2014-11-09 LAB — HEPARIN LEVEL (UNFRACTIONATED): HEPARIN UNFRACTIONATED: 0.3 [IU]/mL (ref 0.30–0.70)

## 2014-11-09 LAB — BRAIN NATRIURETIC PEPTIDE: B Natriuretic Peptide: 1689.6 pg/mL — ABNORMAL HIGH (ref 0.0–100.0)

## 2014-11-09 LAB — PROTIME-INR
INR: 1.72 — ABNORMAL HIGH (ref 0.00–1.49)
Prothrombin Time: 20.1 seconds — ABNORMAL HIGH (ref 11.6–15.2)

## 2014-11-09 LAB — D-DIMER, QUANTITATIVE: D-Dimer, Quant: 1.82 ug/mL-FEU — ABNORMAL HIGH (ref 0.00–0.48)

## 2014-11-09 MED ORDER — ONDANSETRON HCL 4 MG/2ML IJ SOLN: 4.0000 mg | Freq: Three times a day (TID) | INTRAMUSCULAR | Status: DC | PRN

## 2014-11-09 MED ORDER — MORPHINE SULFATE 2 MG/ML IJ SOLN
1.0000 mg | INTRAMUSCULAR | Status: DC | PRN
  Administered 2014-11-09: 1 mg via INTRAVENOUS
  Filled 2014-11-09: qty 1

## 2014-11-09 MED ORDER — TRAMADOL HCL 50 MG PO TABS
50.0000 mg | ORAL_TABLET | Freq: Four times a day (QID) | ORAL | Status: DC | PRN
  Administered 2014-11-09 – 2014-11-11 (×3): 50 mg via ORAL
  Filled 2014-11-09 (×3): qty 1

## 2014-11-09 MED ORDER — FUROSEMIDE 10 MG/ML IJ SOLN
60.0000 mg | Freq: Two times a day (BID) | INTRAMUSCULAR | Status: DC
  Administered 2014-11-09: 60 mg via INTRAVENOUS
  Filled 2014-11-09 (×3): qty 6

## 2014-11-09 MED ORDER — ASPIRIN 81 MG PO CHEW
324.0000 mg | CHEWABLE_TABLET | Freq: Once | ORAL | Status: AC
  Administered 2014-11-09: 324 mg via ORAL
  Filled 2014-11-09: qty 4

## 2014-11-09 MED ORDER — POLYETHYLENE GLYCOL 3350 17 G PO PACK
17.0000 g | PACK | Freq: Every day | ORAL | Status: DC | PRN
  Filled 2014-11-09: qty 1

## 2014-11-09 MED ORDER — METOPROLOL SUCCINATE ER 100 MG PO TB24
100.0000 mg | ORAL_TABLET | Freq: Every day | ORAL | Status: DC
  Administered 2014-11-09 – 2014-11-11 (×3): 100 mg via ORAL
  Filled 2014-11-09: qty 1
  Filled 2014-11-09: qty 4
  Filled 2014-11-09: qty 1

## 2014-11-09 MED ORDER — BENZONATATE 100 MG PO CAPS
100.0000 mg | ORAL_CAPSULE | Freq: Two times a day (BID) | ORAL | Status: DC | PRN
  Administered 2014-11-11 – 2014-11-13 (×3): 100 mg via ORAL
  Filled 2014-11-09 (×3): qty 1

## 2014-11-09 MED ORDER — VANCOMYCIN HCL 10 G IV SOLR
1500.0000 mg | Freq: Once | INTRAVENOUS | Status: AC
  Administered 2014-11-09: 1500 mg via INTRAVENOUS
  Filled 2014-11-09: qty 1500

## 2014-11-09 MED ORDER — ADULT MULTIVITAMIN W/MINERALS CH
1.0000 | ORAL_TABLET | Freq: Every day | ORAL | Status: DC
  Administered 2014-11-09 – 2014-11-17 (×9): 1 via ORAL
  Filled 2014-11-09 (×10): qty 1

## 2014-11-09 MED ORDER — VANCOMYCIN HCL IN DEXTROSE 1-5 GM/200ML-% IV SOLN
1000.0000 mg | Freq: Two times a day (BID) | INTRAVENOUS | Status: DC
  Administered 2014-11-09: 1000 mg via INTRAVENOUS
  Filled 2014-11-09 (×2): qty 200

## 2014-11-09 MED ORDER — LORAZEPAM 2 MG/ML IJ SOLN: 1.0000 mg | Freq: Four times a day (QID) | INTRAMUSCULAR | Status: AC | PRN

## 2014-11-09 MED ORDER — FOLIC ACID 1 MG PO TABS
1.0000 mg | ORAL_TABLET | Freq: Every day | ORAL | Status: DC
  Administered 2014-11-09 – 2014-11-17 (×9): 1 mg via ORAL
  Filled 2014-11-09 (×10): qty 1

## 2014-11-09 MED ORDER — MORPHINE SULFATE 4 MG/ML IJ SOLN
4.0000 mg | Freq: Once | INTRAMUSCULAR | Status: AC
  Administered 2014-11-09: 4 mg via INTRAVENOUS
  Filled 2014-11-09: qty 1

## 2014-11-09 MED ORDER — THIAMINE HCL 100 MG/ML IJ SOLN
100.0000 mg | Freq: Every day | INTRAMUSCULAR | Status: DC
  Filled 2014-11-09 (×8): qty 1

## 2014-11-09 MED ORDER — DEXTROSE 5 % IV SOLN
2.0000 g | Freq: Three times a day (TID) | INTRAVENOUS | Status: DC
  Administered 2014-11-09 – 2014-11-11 (×7): 2 g via INTRAVENOUS
  Filled 2014-11-09 (×11): qty 2

## 2014-11-09 MED ORDER — SODIUM CHLORIDE 0.9 % IJ SOLN
3.0000 mL | Freq: Two times a day (BID) | INTRAMUSCULAR | Status: DC
  Administered 2014-11-09 – 2014-11-16 (×10): 3 mL via INTRAVENOUS
  Administered 2014-11-16: 10 mL via INTRAVENOUS

## 2014-11-09 MED ORDER — FUROSEMIDE 10 MG/ML IJ SOLN
60.0000 mg | Freq: Once | INTRAMUSCULAR | Status: AC
  Administered 2014-11-09: 60 mg via INTRAVENOUS
  Filled 2014-11-09: qty 6

## 2014-11-09 MED ORDER — LORAZEPAM 1 MG PO TABS: 1.0000 mg | ORAL_TABLET | Freq: Four times a day (QID) | ORAL | Status: AC | PRN

## 2014-11-09 MED ORDER — HEPARIN (PORCINE) IN NACL 100-0.45 UNIT/ML-% IJ SOLN
1900.0000 [IU]/h | INTRAMUSCULAR | Status: DC
  Administered 2014-11-09: 1700 [IU]/h via INTRAVENOUS
  Administered 2014-11-10: 1800 [IU]/h via INTRAVENOUS
  Administered 2014-11-10 – 2014-11-11 (×3): 1900 [IU]/h via INTRAVENOUS
  Filled 2014-11-09 (×8): qty 250

## 2014-11-09 MED ORDER — FENTANYL CITRATE (PF) 100 MCG/2ML IJ SOLN
12.5000 ug | Freq: Once | INTRAMUSCULAR | Status: DC
  Filled 2014-11-09: qty 2

## 2014-11-09 MED ORDER — VITAMIN B-1 100 MG PO TABS
100.0000 mg | ORAL_TABLET | Freq: Every day | ORAL | Status: DC
  Administered 2014-11-09 – 2014-11-17 (×9): 100 mg via ORAL
  Filled 2014-11-09 (×10): qty 1

## 2014-11-09 MED ORDER — FENTANYL CITRATE (PF) 100 MCG/2ML IJ SOLN: 12.5000 ug | Freq: Once | INTRAMUSCULAR | Status: DC

## 2014-11-09 MED ORDER — VANCOMYCIN HCL IN DEXTROSE 1-5 GM/200ML-% IV SOLN: 1000.0000 mg | Freq: Once | INTRAVENOUS | Status: DC

## 2014-11-09 NOTE — ED Notes (Signed)
Cardiology at bedside.

## 2014-11-09 NOTE — Progress Notes (Signed)
ANTICOAGULATION CONSULT NOTE - Initial Consult  Pharmacy Consult for heparin Indication: hx LV thrombus, r/o new PE  Allergies  Allergen Reactions  . Celexa [Citalopram Hydrobromide] Other (See Comments)    DISORIENTED  . Lisinopril Cough    Patient Measurements:   Heparin Dosing Weight: 88kg  Vital Signs: Temp: 98.6 F (37 C) (08/10 0638) Temp Source: Oral (08/10 9379) BP: 136/94 mmHg (08/10 1106) Pulse Rate: 122 (08/10 1106)  Labs:  Recent Labs  11/08/14 1223 11/08/14 1236 11/09/14 0750 11/09/14 0820 11/09/14 1148  HGB  --  12.2*  --  11.9*  --   HCT  --  36.2*  --  34.3*  --   PLT  --  368  --  311  --   LABPROT  --   --   --  20.1*  --   INR 1.70  --   --  1.72*  --   CREATININE  --  1.39*  --  1.60*  --   TROPONINI  --   --  0.04*  --  0.05*    Estimated Creatinine Clearance: 64.7 mL/min (by C-G formula based on Cr of 1.6).   Medical History: Past Medical History  Diagnosis Date  . Hypertension   . Alcoholism     a. Sober since Feb 2016.  Marland Kitchen Anxiety   . NICM (nonischemic cardiomyopathy)     a. 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  . LV (left ventricular) mural thrombus   . Acute kidney injury     Medications:  Infusions:  . cefTAZidime (FORTAZ)  IV Stopped (11/09/14 1041)  . heparin    . vancomycin      Assessment: 43 yom presented to the ED with hemoptysis. He is chronically anticoagulated with warfarin for history of LV thrombus. However, INR is subtherapeutic today. Also noted elevated d-dimer so possibility of new PE as well. Per discussion with family medicine, will initiate heparin for anticoagulation while INR is subtherapeutic but will not bolus d/t hemoptysis. Will need close monitoring. Baseline H.H slightly low but platelets are WNL.   Goal of Therapy:   Heparin level 0.3-0.7 units/ml Monitor platelets by anticoagulation protocol: Yes   Plan:  - Heparin gtt 1700 units/hr (previously therapeutic during past admission on this rate) - no bolus - Check an 8 hour heparin level - Daily heparin level and CBC - F/u plans for oral anticoagulation  Robert Booth, Robert Booth 11/09/2014,1:00 PM

## 2014-11-09 NOTE — Progress Notes (Signed)
Pt. Seen this evening. He says that he is feeling better. He received tramadol for pain and this helped the most. He no longer has nausea. He continues to have some bloody sputum production. He does not have worsening SOB. His chest pain is improved. He has no diaphoresis. He has no typical ACS symptoms. He does remain tachycardic. He is asymptomatic at this point.   O:  Filed Vitals:   11/09/14 2048  BP: 110/80  Pulse: 110  Temp: 98.8 F (37.1 C)  Resp: 24   A/P: Pt. With concern for PE vs. HCAP +/- CHF exacerbation. Persistently tachycardic with bloody sputum production that is slowing down. Tachycardia / demand ischemia with potential for PE may be etiology of slightly elevated troponins. No symptoms of ACS at this point. Potential PE already treated with heparin. Symptoms improved.  - will get repeat EKG this evening.  - Continue to trend troponins.  - Consider cards consultation if developing ACS picture or troponin continues to rise.  - Continue heparin - Continue treatment plan as outlined in the resident H&P.   Devota Pace, MD Family Medicine - PGY 2

## 2014-11-09 NOTE — ED Provider Notes (Signed)
[name]: [id number]     Arrival date & time 11/09/14  4098 History   First MD Initiated Contact with Patient 11/09/14 [phone]     Chief Complaint  Patient presents with  . Hemoptysis     (Consider location/radiation/quality/duration/timing/severity/associated sxs/prior Treatment) HPI Comments: 44 year old male with extensive past medical history including hypertension, CHF, cardiomyopathy, LV thrombus on Coumadin who presents with hemoptysis and shortness of breath. The patient was admitted last month for pneumonia. He states that he has had a cough since discharge from the hospital but the cough has worsened recently and over the past 3 days he has been coughing up blood. He has had worsening shortness of breath which is worse with exertion and he cannot lay flat because of it. No fevers. Yesterday, he began having sharp, right-sided chest pain that is constant, worse with deep inspiration, and radiates to his back. He endorses nausea but has not had any vomiting, diarrhea, or abdominal pain. He is had some swelling to his lower extremities for the past one day. He has had some petechiae on his feet for the past one week. Yesterday, he went to his PCP, where his INR was 1.7. He was given Levaquin for treatment of pneumonia.  The history is provided by the patient.    Past Medical History  Diagnosis Date  . Hypertension   . Alcoholism     a. Sober since Feb 2016.  Marland Kitchen Anxiety   . NICM (nonischemic cardiomyopathy)     a. 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  . LV (left ventricular) mural thrombus   . Acute kidney injury    Past Surgical History  Procedure Laterality Date  . Hernia repair Right Inguinal  . Knee surgery Right 1998   Family History  Problem Relation Age of Onset  . Hypertension Mother   .  Hypertension Father   . Heart attack Father 33    died  . Hypertension Sister   . Heart attack Paternal Grandfather 80    died   Social History  Substance Use Topics  . Smoking status: Former Smoker    Types: Cigarettes  . Smokeless tobacco: Former Neurosurgeon    Types: Chew     Comment: Was a rare smoker  . Alcohol Use: No    Review of Systems 10 Systems reviewed and are negative for acute change except as noted in the HPI.   Allergies  Celexa and Lisinopril  Home Medications   Prior to Admission medications   Medication Sig Start Date End Date Taking? Authorizing Provider  furosemide (LASIX) 40 MG tablet 40 mg by mouth daily, take an extra 40 mg dose 6 hrs later as needed for weight gain 11/08/14  Yes Josalyn Funches, MD  levofloxacin (LEVAQUIN) 500 MG tablet Take 1 tablet (500 mg total) by mouth daily. 11/08/14  Yes Josalyn Funches, MD  losartan (COZAAR) 50 MG tablet Take 1 tablet (50 mg total) by mouth daily. 11/08/14  Yes Josalyn Funches, MD  metoprolol succinate (TOPROL-XL) 100 MG 24 hr tablet Take 1 tablet (100 mg total) by mouth daily. Take with or immediately following a meal. 11/08/14  Yes Josalyn Funches, MD  warfarin (COUMADIN) 7.5 MG tablet Take 1 tablet (7.5 mg total) by mouth daily. 10/21/14  Yes [name], MD  guaiFENesin-codeine 100-10 MG/5ML syrup Take 10 mLs by  mouth every 4 (four) hours as needed for cough. 10/28/14   Dayna N Dunn, PA-C   BP 135/97 mmHg  Pulse 127  Temp(Src) 98.6 F (37 C) (Oral)  Resp 27  SpO2 98% Physical Exam  Constitutional: He is oriented to person, place, and time. He appears well-developed and well-nourished.  dyspneic, uncomfortable, sitting straight up and occasionally coughing and spitting into emesis bag  HENT:  Head: Normocephalic and atraumatic.  Mouth/Throat: Oropharynx is clear and moist.  Moist mucous membranes  Eyes: Conjunctivae are normal. Pupils are equal, round, and reactive to light.  Neck: Neck supple.   Cardiovascular: Regular rhythm, normal heart sounds and intact distal pulses.   No murmur heard. tachycardic  Pulmonary/Chest:  Increased WOB w/ tachypnea, diminished BS R lung worst in base; no wheezing  Abdominal: Soft. Bowel sounds are normal. He exhibits no distension. There is no tenderness.  Musculoskeletal:  2+ pitting edema of feet  R>L  Neurological: He is alert and oriented to person, place, and time.  Fluent speech  Skin: Skin is warm and dry.  Petechiae on b/l feet, lower legs  Psychiatric: He has a normal mood and affect. Judgment normal.  Nursing note and vitals reviewed.   ED Course  Procedures (including critical care time) Labs Review Labs Reviewed  BASIC METABOLIC PANEL - Abnormal; Notable for the following:    Chloride 99 (*)    CO2 21 (*)    BUN 22 (*)    Creatinine, Ser 1.60 (*)    GFR calc non Af Amer 51 (*)    GFR calc Af Amer 59 (*)    All other components within normal limits  CBC WITH DIFFERENTIAL/PLATELET - Abnormal; Notable for the following:    WBC 11.1 (*)    RBC 3.99 (*)    Hemoglobin 11.9 (*)    HCT 34.3 (*)    Neutrophils Relative % 81 (*)    Neutro Abs 8.9 (*)    All other components within normal limits  BRAIN NATRIURETIC PEPTIDE - Abnormal; Notable for the following:    B Natriuretic Peptide 1689.6 (*)    All other components within normal limits  PROTIME-INR - Abnormal; Notable for the following:    Prothrombin Time 20.1 (*)    INR 1.72 (*)    All other components within normal limits  TROPONIN I - Abnormal; Notable for the following:    Troponin I 0.04 (*)    All other components within normal limits  CULTURE, BLOOD (ROUTINE X 2)  CULTURE, BLOOD (ROUTINE X 2)  TYPE AND SCREEN    Imaging Review Dg Chest 2 View  11/09/2014   CLINICAL DATA:  Recent pneumonia  EXAM: CHEST  2 VIEW  COMPARISON:  10/13/2014  FINDINGS: Borderline cardiomegaly. There is new nodular consolidation right perihilar region. Persistent infiltrate/  consolidation right lower lobe. Findings suspicious for recurrent multifocal pneumonia. No pulmonary edema. Follow-up to resolution after appropriate treatment recommended.  IMPRESSION: There is new nodular consolidation right perihilar region. Persistent infiltrate/ consolidation right lower lobe. Findings suspicious for recurrent multifocal pneumonia. No pulmonary edema. Follow-up to resolution after appropriate treatment recommended.   Electronically Signed   By: Natasha Mead M.D.   On: 11/09/2014 08:08     EKG Interpretation   Date/Time:  Wednesday November 09 2014 06:29:43 EDT Ventricular Rate:  138 PR Interval:  65 QRS Duration: 84 QT Interval:  389 QTC Calculation: 589 R Axis:   78 Text Interpretation:  Age not entered, assumed to be  44 years old for  purpose of ECG interpretation Sinus tachycardia Nonspecific T  abnormalities, lateral leads Prolonged QT interval Confirmed by LITTLE MD,  RACHEL [phone]) on 11/09/2014 8:10:36 AM      MDM   Final diagnoses:  None   multifocal pneumonia Anemia CHF exacerbation AKI   44 year old male who presents with worsening cough and 3 days of spitting up blood associated with shortness of breath. At presentation, patient awake, alert, dyspneic and uncomfortable but in no acute distress. Vital signs notable for normal O2 sat, heart rate in the 120s. Diminished breath sounds right lung. Petechiae noted on feet. Sent above lab work including BNP, INR, type and screen. Obtained EKG on arrival as well as chest x-ray and gave pt  ASA.  EKG shows no ischemic changes. Chest x-ray shows multifocal pneumonia with no evidence of pulmonary edema. Hemoglobin 11.9 which is slightly lower than the patient's baseline. However, the patient's BNP is elevated at 1689, troponin at 0.04, and creatinine at 1.6, suggesting a heart failure exacerbation which is consistent with the patient's report of increased lower extremity edema. The patient is subtherapeutic with  an INR of 1.7. I have given the patient broad-spectrum antibiotics to treat for hospital-acquired pneumonia including ceftazidime and vanc. I have also given  IV lasix. The patient will be admitted to general medicine for further care.  Laurence Spates, MD 11/09/14 1020

## 2014-11-09 NOTE — Progress Notes (Addendum)
ANTIBIOTIC CONSULT NOTE - INITIAL  Pharmacy Consult for vancomycin + ceftazidime Indication: rule out pneumonia  Allergies  Allergen Reactions  . Celexa [Citalopram Hydrobromide] Other (See Comments)    DISORIENTED  . Lisinopril Cough    Patient Measurements:   Adjusted Body Weight:   Vital Signs: Temp: 98.6 F (37 C) (08/10 0638) Temp Source: Oral (08/10 2336) BP: 135/97 mmHg (08/10 0812) Pulse Rate: 127 (08/10 0812) Intake/Output from previous day:   Intake/Output from this shift:    Labs:  Recent Labs  11/08/14 1236 11/09/14 0820  WBC 10.1 11.1*  HGB 12.2* 11.9*  PLT 368 311  CREATININE 1.39* 1.60*   Estimated Creatinine Clearance: 64.7 mL/min (by C-G formula based on Cr of 1.6). No results for input(s): VANCOTROUGH, VANCOPEAK, VANCORANDOM, GENTTROUGH, GENTPEAK, GENTRANDOM, TOBRATROUGH, TOBRAPEAK, TOBRARND, AMIKACINPEAK, AMIKACINTROU, AMIKACIN in the last 72 hours.   Microbiology: Recent Results (from the past 720 hour(s))  Culture, blood (routine x 2)     Status: None   Collection Time: 10/12/14  9:53 PM  Result Value Ref Range Status   Specimen Description BLOOD RIGHT ANTECUBITAL  Final   Special Requests BOTTLES DRAWN AEROBIC AND ANAEROBIC 5CC  Final   Culture NO GROWTH 5 DAYS  Final   Report Status 10/17/2014 FINAL  Final  Culture, blood (routine x 2)     Status: None   Collection Time: 10/12/14  9:56 PM  Result Value Ref Range Status   Specimen Description BLOOD LEFT HAND  Final   Special Requests BOTTLES DRAWN AEROBIC ONLY 5CC  Final   Culture NO GROWTH 5 DAYS  Final   Report Status 10/17/2014 FINAL  Final    Medical History: Past Medical History  Diagnosis Date  . Hypertension   . Alcoholism     a. Sober since Feb 2016.  Marland Kitchen Anxiety   . NICM (nonischemic cardiomyopathy)     a. diagnosed with systolic CHF in 05/2014 with EF 20% with a negative cath in 05/2014 per records from [place], with etiology of NICM possibly due to combination of  alcoholic cardiomyopathy with superimposed viral myocarditis.  . LV (left ventricular) mural thrombus   . Acute kidney injury     Medications:  Anti-infectives    Start     Dose/Rate Route Frequency Ordered Stop   11/09/14 2200  vancomycin (VANCOCIN) IVPB 1000 mg/200 mL premix     1,000 mg 200 mL/hr over 60 Minutes Intravenous Every 12 hours 11/09/14 0949     11/09/14 1000  cefTAZidime (FORTAZ) 2 g in dextrose 5 % 50 mL IVPB     2 g 100 mL/hr over 30 Minutes Intravenous Every 8 hours 11/09/14 0938     11/09/14 0945  vancomycin (VANCOCIN) IVPB 1000 mg/200 mL premix  Status:  Discontinued     1,000 mg 200 mL/hr over 60 Minutes Intravenous  Once 11/09/14 0938 11/09/14 0943   11/09/14 0945  vancomycin (VANCOCIN) 1,500 mg in sodium chloride 0.9 % 500 mL IVPB     1,500 mg 250 mL/hr over 120 Minutes Intravenous  Once 11/09/14 0944       Assessment: 43 yom presented to the ED with hemoptysis after recent discharge from the hospital. To start broad-spectrum antibiotics for possible pneumonia. Pt is afebrile and WBC is slightly elevated at 11.1. Scr is also elevated at 1.6. Pt was being treated with levaquin PTA.   Vanc 8/10>> Ceftaz 8/10>>  Goal of Therapy:  Vancomycin trough level 15-20 mcg/ml  Plan:  - Vancomycin 1500mg  IV  x 1 then 1gm IV Q12H - Continue ceftazidime 2gm IV Q8H - F/u renal fxn, C&S, clinical status and trough at [facility]  Collins Dimaria, Drake Leach 11/09/2014,9:50 AM

## 2014-11-09 NOTE — Consult Note (Signed)
CARDIOLOGY CONSULT NOTE   Patient ID: Robert Booth MRN: [id number] DOB/AGE: 1970/10/12 44 y.o.  Admit date: 11/09/2014  Primary Physician   Lora Paula, MD Primary Cardiologist   Dr Antoine Poche Reason for Consultation   CHF  [id number] Robert Booth is a 44 y.o. year old male with a history of NICM (EF 20% echo 05/2014), LV thrombus (dx 07/2014), HTN, prior alocholism (quit 05/2014).   Robert Booth was seen in the office 07/29 and weight was 186 lbs. He was euvolemic at that time. A referral was made to the CHF clinic, but his appointment is not until 08/25.   Robert Booth is not able to weigh himself and does not know how much he has gained. He has been compliant with medications. He was formerly eating too much salt but he and his mother are working on this.   He has not noticed any increased from his chronic dyspnea on exertion, which is severe. At baseline, he can only walk about 20 or 30 feet without stopping for breath. He was not having orthopnea and PND. He noticed some edema in his right lower extremity only.  Yesterday, he began coughing up blood and sputum. He has had the hemoptysis before, and you that it was a symptom of worsening heart failure. Then last p.m., he describes orthopnea and PND and was trying to sleep sitting up with limited success. Today, symptoms were no better and he came to the emergency room. In the emergency room there is bloody sputum witnessed and he coughs frequently. He is also having sharp, right-sided chest pain, that is worse with deep inspiration.   In the emergency room, he has been given aspirin, Lasix 60 mg IV, morphine 4 mg, ceftazideme, vancomycin, and started on heparin. He is breathing slightly better, but is still coughing very frequently.   Past Medical History  Diagnosis Date  . Hypertension   . Alcoholism     a. Sober since Feb 2016.  Marland Kitchen Anxiety   . NICM (nonischemic cardiomyopathy)     a. 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  . 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 Acute kidney injury      Past Surgical History  Procedure Laterality Date  . Hernia repair Right Inguinal  . Knee surgery Right 1998  . Cardiac catheterization  05/2014    In [place], clean cors   Allergies  Allergen Reactions  . Celexa [Citalopram Hydrobromide] Other (See Comments)    DISORIENTED  . Lisinopril Cough    I have reviewed the patient's current medications . folic acid  1 mg Oral Daily  . metoprolol succinate  100 mg Oral Daily  . multivitamin with minerals  1 tablet Oral Daily  . sodium chloride  3 mL Intravenous Q12H  . thiamine  100 mg Oral Daily   Or  . thiamine  100 mg Intravenous Daily   . cefTAZidime (FORTAZ)  IV Stopped (11/09/14 1041)  . heparin 1,700 Units/hr (11/09/14 1306)  . vancomycin     LORazepam **OR** LORazepam, morphine injection, ondansetron (ZOFRAN) IV, polyethylene glycol  Prior to Admission medications   Medication Sig Start Date End Date Taking? Authorizing Provider  furosemide (LASIX) 40 MG tablet 40 mg by mouth daily, take an extra 40 mg dose 6 hrs  later as needed for weight gain 11/08/14  Yes Josalyn Funches, MD  levofloxacin (LEVAQUIN) 500 MG tablet Take 1 tablet (500 mg total) by mouth daily. 11/08/14  Yes Josalyn Funches, MD  losartan (COZAAR) 50 MG tablet Take 1 tablet (50 mg total) by mouth daily. 11/08/14  Yes Josalyn Funches, MD  metoprolol succinate (TOPROL-XL) 100 MG 24 hr tablet Take 1 tablet (100 mg total) by mouth daily. Take with or immediately following a meal. 11/08/14  Yes Josalyn Funches, MD  warfarin (COUMADIN) 7.5 MG tablet Take 1 tablet (7.5 mg total) by mouth daily. 10/21/14  Yes [name], MD  guaiFENesin-codeine 100-10 MG/5ML syrup Take 10 mLs by mouth every 4 (four)  hours as needed for cough. 10/28/14   Laurann Montana, PA-C     Social History   Social History  . Marital Status: Single    Spouse Name: N/A  . Number of Children: 6  . Years of Education: N/A   Occupational History  . Unemployed    Social History Main Topics  . Smoking status: Former Smoker    Types: Cigarettes  . Smokeless tobacco: Former Neurosurgeon    Types: Chew     Comment: Was a rare smoker  . Alcohol Use: No  . Drug Use: No  . Sexual Activity: Yes   Other Topics Concern  . Not on file   Social History Narrative   Lives with mom.      Family Status  Relation Status Death Age  . Mother Alive   . Father Deceased   . Sister Alive   . Paternal Grandfather Deceased    Family History  Problem Relation Age of Onset  . Hypertension Mother   . Hypertension Father   . Heart attack Father 58    died  . Hypertension Sister   . Heart attack Paternal Grandfather 50    died     ROS:  Full 14 point review of systems complete and found to be negative unless listed above.  Physical Exam:  Wt Readings from Last 3 Encounters:  11/08/14 194 lb (87.998 kg)  10/28/14 186 lb (84.369 kg)  10/19/14 198 lb 1.6 oz (89.858 kg)   Blood pressure 127/89, pulse 124, temperature 98.6 F (37 C), temperature source Oral, resp. rate 29, SpO2 94 %.  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   Normocephalic and atraumatic, oropharynx without edema or exudate. Dentition:  Good Lungs:  Rales bilateral bases, right greater than left Heart: HRRR S1 S2, no rub, murmur. +  S3.  pulses are 2+ all 4 extrem.   Neck: No carotid bruits. No lymphadenopathy.  JVD11 cm. Abdomen: Bowel sounds present, abdomen soft and non-tender without masses or hernias noted. Msk:  No spine or cva tenderness. No weakness, no joint deformities or effusions. Extremities: No clubbing or cyanosis.  right pedal and ankle edema worse than left.  Neuro: Alert and oriented X  3. No focal deficits noted. Psych:  Good affect, responds appropriately Skin: No rashes or lesions noted.  Labs:   Lab Results  Component Value Date   WBC 11.1* 11/09/2014   HGB 11.9* 11/09/2014   HCT 34.3* 11/09/2014   MCV 86.0 11/09/2014   PLT 311 11/09/2014    Recent Labs  11/09/14 0820  INR 1.72*     Recent Labs Lab 11/09/14 0820  NA 135  K 4.1  CL 99*  CO2 21*  BUN 22*  CREATININE 1.60*  CALCIUM  8.9  GLUCOSE 94   MAGNESIUM  Date Value Ref Range Status  10/13/2014 1.8 1.7 - 2.4 mg/dL Final    Recent Labs  16/10/96 0750 11/09/14 1148  TROPONINI 0.04* 0.05*   B NATRIURETIC PEPTIDE  Date/Time Value Ref Range Status  11/09/2014 08:20 AM 1689.6* 0.0 - 100.0 pg/mL Final  10/17/2014 04:38 AM 1091.1* 0.0 - 100.0 pg/mL Final   Lab Results  Component Value Date   CHOL 262* 07/21/2012   HDL 57 07/21/2012   LDLCALC 172* 07/21/2012   TRIG 163* 07/21/2012   Lab Results  Component Value Date   DDIMER 1.82* 11/09/2014   LIPASE  Date/Time Value Ref Range Status  10/12/2014 05:10 PM 42 22 - 51 U/L Final   TSH  Date/Time Value Ref Range Status  10/28/2014 09:57 AM 3.102 0.350 - 4.500 uIU/mL Final   Echo: 10/15/2014 - Left ventricle: Large chronic appearing partially calcified mural apical and apical lateral thrombus. The cavity size was severely dilated. Wall thickness was normal. The estimated ejection fraction was 20%. - Left atrium: The atrium was moderately dilated. - Atrial septum: No defect or patent foramen ovale was identified.  ECG:  11/09/2014 Sinus tachycardia, no significant change   Radiology:  Dg Chest 2 View 11/09/2014   CLINICAL DATA:  Recent pneumonia  EXAM: CHEST  2 VIEW  COMPARISON:  10/13/2014  FINDINGS: Borderline cardiomegaly. There is new nodular consolidation right perihilar region. Persistent infiltrate/ consolidation right lower lobe. Findings suspicious for recurrent multifocal pneumonia. No pulmonary edema. Follow-up to  resolution after appropriate treatment recommended.  IMPRESSION: There is new nodular consolidation right perihilar region. Persistent infiltrate/ consolidation right lower lobe. Findings suspicious for recurrent multifocal pneumonia. No pulmonary edema. Follow-up to resolution after appropriate treatment recommended.   Electronically Signed   By: Natasha Mead M.D.   On: 11/09/2014 08:08    ASSESSMENT AND PLAN:   The patient was seen today by Dr Presley Raddle, the patient evaluated and the data reviewed.     Acute on chronic systolic CHF (congestive heart failure), NYHA class 4 - weight is up 8 lbs and has volume overload by exam.  - confounding issue is PNA, no clear CXR documented between July and now.  - will continue diuresis at Lasix 60 mg IV BID, following weights, I/O, and renal function - hold ARB - continue BB as BP will allow.  Otherwise, per IM. Feel pt would benefit from Pulmonary consult, may need bronch. Principal Problem:   Pneumonia Active Problems:   HTN (hypertension)   Congestive dilated cardiomyopathy   History of alcohol abuse   Hemoptysis   LV (left ventricular) mural thrombus   SIRS (systemic inflammatory response syndrome)   AKI (acute kidney injury)    Signed: Theodore Demark, PA-C 11/09/2014 4:21 PM Beeper [phone]  Co-Sign MD History and all data above reviewed.  Patient examined.  I agree with the findings as above.  The patient exam reveals COR: tachycardia, regular rhythm.  III/VI holosystolic murmur at apex and LLSB.  +S3  ,  Lungs: bibasilar crackles, R>L  ,  Abd: soft, NT, ND, Ext Warm.  2+ radial, DP, PT pulses.  2+ pitting edema to ankles  .  All available labs, radiology testing, previous records reviewed. Agree with documented assessment and plan. Agree with holding losartan in the setting of acute on chronic renal failure.  Robert. Tomberlin is clearly volume overloaded, which is contributing to his SOB.  Will diurese and likely start spironolactone prior to  discharge if  renal function permits.  After 3 months of consecutive, optimal medical management he will need an evaluation for ICD.  However, I am also concerned about his persistent RLL pneumonia and hemoptysis.  Would recommend consulting Pulmonary for bronch, BAL and cultures to rule out atypical infections and better tailor therapy.  There was no report of masses on CT, but patient has a tobacco history.   Chilton Si P  5:30 PM  11/09/2014

## 2014-11-09 NOTE — Progress Notes (Signed)
ANTICOAGULATION CONSULT NOTE - Follow Up Consult  Pharmacy Consult for Heparin Indication: hx LV thrombus, r/o new PE  Allergies  Allergen Reactions  . Celexa [Citalopram Hydrobromide] Other (See Comments)    DISORIENTED  . Lisinopril Cough    Patient Measurements: Height: 5\' 9"  (175.3 cm) Weight: 192 lb 10.9 oz (87.4 kg) IBW/kg (Calculated) : 70.7 Heparin Dosing Weight: 87.4 kg  Vital Signs: Temp: 98.8 F (37.1 C) (08/10 2048) Temp Source: Oral (08/10 2048) BP: 110/80 mmHg (08/10 2048) Pulse Rate: 110 (08/10 2048)  Labs:  Recent Labs  11/08/14 1223  11/08/14 1236 11/09/14 0750 11/09/14 0820 11/09/14 1148 11/09/14 1848 11/09/14 2139  HGB  --   < > 12.2*  --  11.9*  --   --  12.2*  HCT  --   --  36.2*  --  34.3*  --   --  34.8*  PLT  --   --  368  --  311  --   --  317  LABPROT  --   --   --   --  20.1*  --   --   --   INR 1.70  --   --   --  1.72*  --   --   --   HEPARINUNFRC  --   --   --   --   --   --   --  0.30  CREATININE  --   --  1.39*  --  1.60*  --   --   --   TROPONINI  --   --   --  0.04*  --  0.05* 0.07*  --   < > = values in this interval not displayed.  Estimated Creatinine Clearance: 64.5 mL/min (by C-G formula based on Cr of 1.6).  Assessment:   Initial heparin level is low therapeutic (0.30) on 1700 units/hr.   INR 1.72, last Coumadin dose 8/9. Held due to hemoptysis.  Goal of Therapy:  Heparin level 0.3-0.7 units/ml Monitor platelets by anticoagulation protocol: Yes   Plan:   Increase heparin drip to 1800 units/hr, to try to keep level in target range.  Daily heparin level and CBC.  Will follow up for clearing of hemoptysis and restart of Coumadin.  Follow up studies for PE.  Dennie Fetters, [place] Pager: [phone] 11/09/2014,11:01 PM

## 2014-11-09 NOTE — ED Notes (Signed)
Hospitalist at bedside 

## 2014-11-09 NOTE — H&P (Signed)
Family Medicine Teaching [facility] Admission History and Physical Service Pager: [phone]  Patient name: Robert Booth Medical record number: [id number] Date of birth: [birth year] Age: 44 y.o. Gender: male  Primary Care Provider: No PCP Per Patient Consultants: cards Code Status: FULL (discussed on admission)  Chief Complaint: SOB, LE edema, R sided CP  Assessment and Plan: Robert Booth is a 44 y.o. male presenting with cough, SOB, hemoptysis and pleuritic chest pain. PMH is significant for HTN, dilated cardiomyopathy (NICM, reduced EF) alcoholism, and anxiety.   DOE / Hemoptysis/ Right sided CP, likely multifactorial.  Presume PE with elevated D-Dimer.  Recently admitted in July with similar presentation.  He was treated as CAP at that time.  CTA 09/2014: could not r/o distal branch PE.  In ED, HR 122, RR 20, BP stable, no O2 requirement.  WBC 11.1 on admission.  BNP 1689.6.  Lactic acid 1.8. D-Dimer 1.82. Meets SIRS criteria.  qSOFA 0.  DDx Multifocal PNA (CXR & leukocytosis support this but has been afebrile) vs PE (subtherapeutic INR, with tachycardia and POSITIVE D dimer) vs CHF exacerbation (BNP is increased, +2 pitting edema LE to ankle) vs MI (slightly elevated trop 0.04>0.5, no ischemic changes on EKG) vs goodpasture syndrome (could consider this in the setting of AKI with hemoptysis).   - Admit to telemetry under Dr Gwendolyn Grant - VS per floor protocol - S/p Vanc/Ceftaz in ED - Continue Vanc and Ceftaz per pharmacy.  Switch to orals when able. - Morphine IV PRN severe chest pain - Consider repeat CXR in am - Would consider CTA vs obtain V/Q scan  - BCx pending (before abx) - Repeat CBC this evening, follow hgb in the setting of hemoptysis and anticoagulation for presumed PE - BMP, CBC in am - Cycle troponin - Consult pharmacy for anticoag management.  Will initiate heparin gtt in setting of hemoptysis, known LV thrombus and positive D-Dimer.   - Resume Coumadin per pharmacy when  able. - c/s Cardiology, appreciate recs.  Presumed Acute on Chronic Systolic CHF / Dilated NICM  Followed by Dr Antoine Poche outpatient.  HFrEF Dx in 04/2014.  July 2016 ECHO w/ LVEF 20%, s/p cardiac cath in 2016 = normal coronary arteries. Dx LV thrombus in 03/16.  Patient reports taking Lasix 40 BID over the last week.  Reporting compliance with coumadin.  Bilateral pitting edema to ankle on exam today. R lung fields with fine crackles and decreased air movement.  BNP elevated to 1689.6.  Was 1091.1 at last admission. Trop 0.04 (likely demand ischemia)  EKG - sinus tach with nonspecific T wave abnormality.  Fairly similar to 10/13/14 EKG, except that today's EKG shows slightly larger T waves in V3 - Will continue to cycle troponin - Repeat EKG in AM  - s/p Lasix 60 mg IV x1 in ED - Consider redosing lasix as appropriate - Strict I/O, Daily weights - c/s Cardiology appreciate recs. - c/s pharmacy for anticoagulation as above.  AKI: Elevated SCr 1.60 (1.22 in July) - Will refrain from initiating IVFs at this time, in the setting of fluids being administered with IV Abx & known EF 20% - Would consider gentle IVF if continued renal injury - Continue to monitor - Hold nephrotoxic meds (ARB) for now  HTN: Normotensive on admission.  - Hold home losartan - Continue metoprolol  H/o Anxiety: not currently medically treated  Alcohol Abuse. Denies ETOH use, though mother present in room. - CIWA protocol.    FEN/GI: heart healthy diet, SLIV for now  Prophylaxis: Heparin gtt for LV thrombus/presumed PE.  Disposition: Admit to telemetry.  Discharge pending further evaluation and improvement in symptoms.  Anticipate patient will be able to be d/c home when able.  History of Present Illness: Robert Booth is a 44 y.o. male presenting with SOB, R sided CP, hemoptysis Patient accompanied by his mother.  Patient reports that he has had an ongoing cough since last admission.  Cough has worsened over the  last couple of weeks.  He developed hemoptysis again about 3 days ago.  He reports R sided CP that is not exacerbated by activity but is worsened by deep breathing/ cough.  Pain is sharp and intermittent.  He also reports LE edema >1 week.  He has been taking Lasix 40mg  BID since this started with little improvement.  He endorses SOB on exertion.  Has been staying at home a lot and is unsure how far he can walk before developing DOE.  He discussed these symptoms with his PCP Dr Armen Pickup yesterday during his physical exam and she started him on Levaquin and recommended that he get a CXR today at the hospital.  His symptoms were worse this am, so he came to the ED.  Patient DENIES: fevers, chills, sick contacts, vomiting, diarrhea, hematochezia, melena, bleeding from any other areas, loss of vision, or headache.  Has not traveled recently and has no know contact with or exposure to TB.  Has not been using NSAIDS and is hydrating as able (has a fluid restriction for CHF).  ENDORSES Nausea (~2 weeks), SOB, right sided CP, LE edema, abdominal bloating, intermittent vision changes (has appt with ophthalmologist).  Review Of Systems: Per HPI with the following additions: none Otherwise 12 point review of systems was performed and was unremarkable.  Patient Active Problem List   Diagnosis Date Noted  . Anticoagulated on Coumadin 11/08/2014  . Visual disturbance 11/08/2014  . Encounter for therapeutic drug monitoring 10/21/2014  . CAP (community acquired pneumonia) 10/12/2014  . CHF (congestive heart failure)   . Congestive dilated cardiomyopathy   . History of alcohol abuse   . Hemoptysis   . LV (left ventricular) mural thrombus   . Nonischemic cardiomyopathy 08/18/2014  . HTN (hypertension) 08/18/2014   Past Medical History: Past Medical History  Diagnosis Date  . Hypertension   . Alcoholism     a. Sober since Feb 2016.  Marland Kitchen Anxiety   . NICM (nonischemic cardiomyopathy)     a. 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  . LV (left ventricular) mural thrombus   . Acute kidney injury    Past Surgical History: Past Surgical History  Procedure Laterality Date  . Hernia repair Right Inguinal  . Knee surgery Right 1998   Social History: Social History  Substance Use Topics  . Smoking status: Former Smoker    Types: Cigarettes  . Smokeless tobacco: Former Neurosurgeon    Types: Chew     Comment: Was a rare smoker  . Alcohol Use: No   Additional social history: no tobacco, ETOH, drug  Please also refer to relevant sections of EMR.  Family History: Family History  Problem Relation Age of Onset  . Hypertension Mother   . Hypertension Father   . Heart attack Father 34    died  . Hypertension Sister   . Heart attack Paternal Grandfather  50    died   Allergies and Medications: Allergies  Allergen Reactions  . Celexa [Citalopram Hydrobromide] Other (See Comments)    DISORIENTED  . Lisinopril Cough   No current facility-administered medications on file prior to encounter.   Current Outpatient Prescriptions on File Prior to Encounter  Medication Sig Dispense Refill  . furosemide (LASIX) 40 MG tablet 40 mg by mouth daily, take an extra 40 mg dose 6 hrs later as needed for weight gain 60 tablet 2  . levofloxacin (LEVAQUIN) 500 MG tablet Take 1 tablet (500 mg total) by mouth daily. 7 tablet 0  . losartan (COZAAR) 50 MG tablet Take 1 tablet (50 mg total) by mouth daily. 30 tablet 5  . metoprolol succinate (TOPROL-XL) 100 MG 24 hr tablet Take 1 tablet (100 mg total) by mouth daily. Take with or immediately following a meal. 30 tablet 3  . warfarin (COUMADIN) 7.5 MG tablet Take 1 tablet (7.5 mg total) by mouth daily. 30 tablet 0  . guaiFENesin-codeine 100-10 MG/5ML syrup Take 10 mLs by mouth every 4 (four)  hours as needed for cough.      Objective: BP 135/97 mmHg  Pulse 127  Temp(Src) 98.6 F (37 C) (Oral)  Resp 27  SpO2 98% Exam: General: awake, alert, sitting up in bed, NAD, mother at bedside Eyes: EOMI, uses corrective lenses  ENTM: o/p clear with mildly dry MM Neck: FROM, JVP ~6-7cm Cardiovascular: RRR, soft systolic murmur at L 2nd intercostal space, +2 radial pulses Respiratory: L lung fields with slightly decreased air movement, R lung field with moderately decreased air movement and fine crackle appreciated throughout R lung fields. Abdomen: soft, NT/ND, +BS (slightly hypoactive) MSK: moves extremities independently, b/l +2 LE edema to ankles, +1 pedal pulses b/l Skin: small healing wounds on anterior aspect of LEs, otherwise appears to be intact Neuro: follows commands, no focal neurologic deficits appreciated Psych: mood stable, speech normal, pleasant  Labs and Imaging: Results for orders placed or performed during the hospital encounter of 11/09/14 (from the past 24 hour(s))  Type and screen     Status: None   Collection Time: 11/09/14  7:45 AM  Result Value Ref Range   ABO/RH(D) B POS    Antibody Screen NEG    Sample Expiration 11/12/2014   Blood Culture (routine x 2)     Status: None (Preliminary result)   Collection Time: 11/09/14  7:45 AM  Result Value Ref Range   Specimen Description BLOOD RIGHT ANTECUBITAL    Special Requests BOTTLES DRAWN AEROBIC ONLY 3CC    Culture PENDING    Report Status PENDING   Troponin I     Status: Abnormal   Collection Time: 11/09/14  7:50 AM  Result Value Ref Range   Troponin I 0.04 (H) <0.031 ng/mL  Basic metabolic panel     Status: Abnormal   Collection Time: 11/09/14  8:20 AM  Result Value Ref Range   Sodium 135 135 - 145 mmol/L   Potassium 4.1 3.5 - 5.1 mmol/L   Chloride 99 (L) 101 - 111 mmol/L   CO2 21 (L) 22 - 32 mmol/L   Glucose, Bld 94 65 - 99 mg/dL   BUN 22 (H) 6 - 20 mg/dL   Creatinine, Ser 9.14 (H) 0.61 - 1.24  mg/dL   Calcium 8.9 8.9 - 78.2 mg/dL   GFR calc non Af Amer 51 (L) >60 mL/min   GFR calc Af Amer 59 (L) >60 mL/min   Anion gap 15 5 -  15  CBC with Differential     Status: Abnormal   Collection Time: 11/09/14  8:20 AM  Result Value Ref Range   WBC 11.1 (H) 4.0 - 10.5 K/uL   RBC 3.99 (L) 4.22 - 5.81 MIL/uL   Hemoglobin 11.9 (L) 13.0 - 17.0 g/dL   HCT 16.1 (L) 09.6 - 04.5 %   MCV 86.0 78.0 - 100.0 fL   MCH 29.8 26.0 - 34.0 pg   MCHC 34.7 30.0 - 36.0 g/dL   RDW 40.9 81.1 - 91.4 %   Platelets 311 150 - 400 K/uL   Neutrophils Relative % 81 (H) 43 - 77 %   Neutro Abs 8.9 (H) 1.7 - 7.7 K/uL   Lymphocytes Relative 12 12 - 46 %   Lymphs Abs 1.4 0.7 - 4.0 K/uL   Monocytes Relative 7 3 - 12 %   Monocytes Absolute 0.8 0.1 - 1.0 K/uL   Eosinophils Relative 0 0 - 5 %   Eosinophils Absolute 0.0 0.0 - 0.7 K/uL   Basophils Relative 0 0 - 1 %   Basophils Absolute 0.0 0.0 - 0.1 K/uL  Brain natriuretic peptide     Status: Abnormal   Collection Time: 11/09/14  8:20 AM  Result Value Ref Range   B Natriuretic Peptide 1689.6 (H) 0.0 - 100.0 pg/mL  Protime-INR     Status: Abnormal   Collection Time: 11/09/14  8:20 AM  Result Value Ref Range   Prothrombin Time 20.1 (H) 11.6 - 15.2 seconds   INR 1.72 (H) 0.00 - 1.49  D-dimer, quantitative (not at [facility])     Status: Abnormal   Collection Time: 11/09/14 11:48 AM  Result Value Ref Range   D-Dimer, Quant 1.82 (H) 0.00 - 0.48 ug/mL-FEU   Dg Chest 2 View  11/09/2014   CLINICAL DATA:  Recent pneumonia  EXAM: CHEST  2 VIEW  COMPARISON:  10/13/2014  FINDINGS: Borderline cardiomegaly. There is new nodular consolidation right perihilar region. Persistent infiltrate/ consolidation right lower lobe. Findings suspicious for recurrent multifocal pneumonia. No pulmonary edema. Follow-up to resolution after appropriate treatment recommended.  IMPRESSION: There is new nodular consolidation right perihilar region. Persistent infiltrate/ consolidation right lower lobe.  Findings suspicious for recurrent multifocal pneumonia. No pulmonary edema. Follow-up to resolution after appropriate treatment recommended.   Electronically Signed   By: Natasha Mead M.D.   On: 11/09/2014 08:08    Raliegh Ip, DO 11/09/2014, 10:31 AM PGY-2, [facility] FPTS Intern pager: [phone], text pages welcome

## 2014-11-09 NOTE — ED Notes (Signed)
Pt states that he has been coughing up blood for the past three days. Pt was discharged from this hospital about three weeks ago for the same chief complaint.

## 2014-11-10 ENCOUNTER — Inpatient Hospital Stay (HOSPITAL_COMMUNITY): Payer: Medicaid Other

## 2014-11-10 LAB — CBC
HCT: 37 % — ABNORMAL LOW (ref 39.0–52.0)
HEMOGLOBIN: 12.9 g/dL — AB (ref 13.0–17.0)
MCH: 29.9 pg (ref 26.0–34.0)
MCHC: 34.9 g/dL (ref 30.0–36.0)
MCV: 85.8 fL (ref 78.0–100.0)
Platelets: 293 10*3/uL (ref 150–400)
RBC: 4.31 MIL/uL (ref 4.22–5.81)
RDW: 15.2 % (ref 11.5–15.5)
WBC: 18.3 10*3/uL — ABNORMAL HIGH (ref 4.0–10.5)

## 2014-11-10 LAB — TROPONIN I
TROPONIN I: 0.09 ng/mL — AB (ref <0.031)
TROPONIN I: 0.08 ng/mL — AB (ref <0.031)
Troponin I: 0.07 ng/mL — ABNORMAL HIGH (ref <0.031)
Troponin I: 0.06 ng/mL — ABNORMAL HIGH (ref <0.031)

## 2014-11-10 LAB — BASIC METABOLIC PANEL
Anion gap: 18 — ABNORMAL HIGH (ref 5–15)
BUN: 33 mg/dL — AB (ref 6–20)
CALCIUM: 8.7 mg/dL — AB (ref 8.9–10.3)
CHLORIDE: 97 mmol/L — AB (ref 101–111)
CO2: 18 mmol/L — AB (ref 22–32)
Creatinine, Ser: 2.03 mg/dL — ABNORMAL HIGH (ref 0.61–1.24)
GFR calc Af Amer: 44 mL/min — ABNORMAL LOW (ref >60)
GFR calc non Af Amer: 38 mL/min — ABNORMAL LOW (ref >60)
Glucose, Bld: 91 mg/dL (ref 65–99)
Potassium: 5.2 mmol/L — ABNORMAL HIGH (ref 3.5–5.1)
Sodium: 133 mmol/L — ABNORMAL LOW (ref 135–145)

## 2014-11-10 LAB — HEPATIC FUNCTION PANEL
ALK PHOS: 75 U/L (ref 38–126)
ALT: 697 U/L — ABNORMAL HIGH (ref 17–63)
AST: 1290 U/L — ABNORMAL HIGH (ref 15–41)
Albumin: 3 g/dL — ABNORMAL LOW (ref 3.5–5.0)
BILIRUBIN INDIRECT: 2.3 mg/dL — AB (ref 0.3–0.9)
Bilirubin, Direct: 1 mg/dL — ABNORMAL HIGH (ref 0.1–0.5)
TOTAL PROTEIN: 7.1 g/dL (ref 6.5–8.1)
Total Bilirubin: 3.3 mg/dL — ABNORMAL HIGH (ref 0.3–1.2)

## 2014-11-10 LAB — PROTIME-INR
INR: 2.62 — AB (ref 0.00–1.49)
Prothrombin Time: 27.7 seconds — ABNORMAL HIGH (ref 11.6–15.2)

## 2014-11-10 LAB — HEPARIN LEVEL (UNFRACTIONATED)
Heparin Unfractionated: 0.2 IU/mL — ABNORMAL LOW (ref 0.30–0.70)
Heparin Unfractionated: 0.54 IU/mL (ref 0.30–0.70)

## 2014-11-10 MED ORDER — ONDANSETRON HCL 4 MG/2ML IJ SOLN: 4.0000 mg | Freq: Three times a day (TID) | INTRAMUSCULAR | Status: DC | PRN

## 2014-11-10 MED ORDER — METOCLOPRAMIDE HCL 5 MG/ML IJ SOLN
5.0000 mg | Freq: Four times a day (QID) | INTRAMUSCULAR | Status: DC | PRN
  Administered 2014-11-10: 5 mg via INTRAVENOUS
  Filled 2014-11-10: qty 2

## 2014-11-10 MED ORDER — TECHNETIUM TC 99M DIETHYLENETRIAME-PENTAACETIC ACID: 39.0000 | Freq: Once | INTRAVENOUS | Status: DC | PRN

## 2014-11-10 MED ORDER — TECHNETIUM TO 99M ALBUMIN AGGREGATED
5.9000 | Freq: Once | INTRAVENOUS | Status: AC | PRN
  Administered 2014-11-10: 6 via INTRAVENOUS

## 2014-11-10 MED ORDER — CHLORHEXIDINE GLUCONATE CLOTH 2 % EX PADS
6.0000 | MEDICATED_PAD | Freq: Every day | CUTANEOUS | Status: AC
  Administered 2014-11-10 – 2014-11-14 (×5): 6 via TOPICAL

## 2014-11-10 MED ORDER — VANCOMYCIN HCL IN DEXTROSE 750-5 MG/150ML-% IV SOLN
750.0000 mg | Freq: Two times a day (BID) | INTRAVENOUS | Status: DC
  Administered 2014-11-10 – 2014-11-12 (×5): 750 mg via INTRAVENOUS
  Filled 2014-11-10 (×7): qty 150

## 2014-11-10 MED ORDER — PROMETHAZINE HCL 25 MG/ML IJ SOLN
25.0000 mg | Freq: Four times a day (QID) | INTRAMUSCULAR | Status: DC | PRN
  Administered 2014-11-10: 25 mg via INTRAVENOUS
  Filled 2014-11-10: qty 1

## 2014-11-10 MED ORDER — MUPIROCIN 2 % EX OINT
1.0000 "application " | TOPICAL_OINTMENT | Freq: Two times a day (BID) | CUTANEOUS | Status: AC
  Administered 2014-11-10 – 2014-11-14 (×10): 1 via NASAL
  Filled 2014-11-10 (×2): qty 22

## 2014-11-10 MED ORDER — ONDANSETRON HCL 4 MG/2ML IJ SOLN
4.0000 mg | Freq: Once | INTRAMUSCULAR | Status: AC
  Administered 2014-11-10: 4 mg via INTRAVENOUS
  Filled 2014-11-10: qty 2

## 2014-11-10 MED ORDER — PANTOPRAZOLE SODIUM 40 MG PO TBEC
40.0000 mg | DELAYED_RELEASE_TABLET | Freq: Every day | ORAL | Status: DC
  Administered 2014-11-10 – 2014-11-17 (×8): 40 mg via ORAL
  Filled 2014-11-10 (×8): qty 1

## 2014-11-10 NOTE — Progress Notes (Signed)
ANTICOAGULATION NOTE - Follow-up  Pharmacy Consult for heparin  Indication: hx LV thrombus, r/o new PE  Allergies  Allergen Reactions  . Celexa [Citalopram Hydrobromide] Other (See Comments)    DISORIENTED  . Lisinopril Cough    Patient Measurements: Height: 5\' 9"  (175.3 cm) Weight: 190 lb 11.2 oz (86.5 kg) IBW/kg (Calculated) : 70.7 Heparin Dosing Weight: 87.4kg  Vital Signs: Temp: 97.4 F (36.3 C) (08/11 1701) Temp Source: Oral (08/11 1701) BP: 102/79 mmHg (08/11 1701) Pulse Rate: 109 (08/11 1701)  Labs:  Recent Labs  11/08/14 1223  11/08/14 1236  11/09/14 0820  11/09/14 2139 11/10/14 0224 11/10/14 0341 11/10/14 0825 11/10/14 1540 11/10/14 1821  HGB  --   < > 12.2*  --  11.9*  --  12.2*  --   --   --  12.9*  --   HCT  --   < > 36.2*  --  34.3*  --  34.8*  --   --   --  37.0*  --   PLT  --   < > 368  --  311  --  317  --   --   --  293  --   LABPROT  --   --   --   --  20.1*  --   --   --  27.7*  --   --   --   INR 1.70  --   --   --  1.72*  --   --   --  2.62*  --   --   --   HEPARINUNFRC  --   --   --   --   --   --  0.30  --  0.20*  --   --  0.54  CREATININE  --   --  1.39*  --  1.60*  --   --   --  2.03*  --   --   --   TROPONINI  --   --   --   < >  --   < >  --  0.09*  --  0.07* 0.06*  --   < > = values in this interval not displayed.  Estimated Creatinine Clearance: 50.6 mL/min (by C-G formula based on Cr of 2.03).  Medications:  Infusions:  . heparin 1,900 Units/hr (11/10/14 1700)    Assessment: 43 yom presented to the ED with hemoptysis. He is chronically anticoagulated with warfarin for history of LV thrombus. However, INR was subtherapeutic upon admission so IV heparin was started. Also noted elevated d-dimer so possibility of new PE as well. INR jumped up to 2.62 today for unclear reasons. Hemoptysis has improved per pt report. Per discussion with MD, will continue IV heparin for now as treating possible new PE with unclear cause for INR increase.  Likely INR will fall quickly with held doses of warfarin. Heparin level was subtherapeutic today at 0.2>>0.54 now in goal.  Goal of Therapy:  Heparin level 0.3-0.7 units/ml Monitor platelets by anticoagulation protocol: Yes   Plan:  - Continue IV heparin at 900 units/hr - Daily HL, CBC, INR - Pulmonary recommends:Would obtain venous duplex, if this is positive-VQ scan would not be necessary.    Ajah Vanhoose S. Merilynn Finland, PharmD, BCPS Clinical Staff Pharmacist Pager [phone]  Misty Stanley Stillinger 11/10/2014,8:13 PM

## 2014-11-10 NOTE — Consult Note (Signed)
Name: Robert Booth MRN: [id number] DOB: 05-07-1970    ADMISSION DATE:  11/09/2014 CONSULTATION DATE:  11/10/2014  REFERRING MD :  Danise Edge  CHIEF COMPLAINT:  hemoptysis   HISTORY OF PRESENT ILLNESS:  44 year old with dilated cardiomyopathy diagnosed in 05/2014, maintained on anticoagulation for mural LV thrombus. He was undergoing care at [facility] and now has transferred to [facility]. 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 He was admitted in 09/2014 for right-sided pleuritic chest pain and hemoptysis, chest x-ray showed right lower lobe infiltrate, CT chest showed patchy right lower lobe consolidation, interestingly disappears wedge-shaped on my review, and there was some focal hypoperfusion described on CT angio-and the question of distal pulmonary embolism was raised. He is treated with antibiotics, hemoptysis resolved chest pain improved for about 2 weeks and then recurred for the last 4 days. He has been therapeutic on Coumadin-on interim checks-but on admit his INR was 1.7. He reports frank hemoptysis-less than 5 mL per day. He has been maintained on heparin. Chest x-ray shows new nodular consolidation right perihilar and persistent infiltrate in the right lower lobe.  Unfortunately due to diuretics his renal function has worsened, lowest creatinine was 1.2 now up to 2.0   he is being treated with ceftaz and vancomycin for HCAP  SIGNIFICANT EVENTS  7/13 - admitted for hemoptysis-treated as CAP 8/10 adm for hemoptysis  STUDIES:  CT angio 7/ 13 >> Patchy right lower lobe consolidation most compatible with pneumonia and less likely representing infarct. Apparent non-opacification of the distal subsegmental pulmonary artery branch extending into the consolidated area may be artifactual or represent focal hypoperfusion ? 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  has past surgical history that includes Hernia repair (Right, Inguinal); Knee surgery (Right, 1998); and Cardiac catheterization (05/2014). Prior to Admission medications   Medication Sig Start Date End Date Taking? Authorizing Provider  furosemide (LASIX) 40 MG tablet 40 mg by mouth daily, take an extra 40 mg dose 6 hrs later as needed for weight gain 11/08/14  Yes Josalyn Funches, MD  levofloxacin (LEVAQUIN) 500 MG tablet Take 1 tablet (500 mg total) by mouth daily. 11/08/14  Yes Josalyn Funches, MD  losartan (COZAAR) 50 MG tablet Take 1 tablet (50 mg total) by mouth daily. 11/08/14  Yes Josalyn Funches, MD  metoprolol succinate (TOPROL-XL) 100 MG 24 hr tablet Take 1 tablet (100 mg total) by mouth daily. Take with or immediately following a meal. 11/08/14  Yes Josalyn Funches, MD  warfarin (COUMADIN) 7.5 MG tablet Take 1 tablet (7.5 mg total) by mouth daily. 10/21/14  Yes [name], MD  guaiFENesin-codeine 100-10 MG/5ML syrup Take 10 mLs by mouth every 4 (four) hours as needed for cough. 10/28/14   Dayna N Dunn, PA-C   Allergies  Allergen Reactions  . Celexa [Citalopram Hydrobromide] Other (See Comments)    DISORIENTED  . Lisinopril Cough    FAMILY HISTORY:  family history includes Heart attack (age of onset: 61) in his paternal grandfather; Heart attack (age of onset: 65) in his father; Hypertension in his father, mother, and sister. SOCIAL HISTORY:  reports that he has quit smoking. His smoking use included Cigarettes. He has quit using smokeless tobacco. His smokeless tobacco use included Chew. He reports that he does not drink alcohol or use illicit drugs.  REVIEW OF SYSTEMS:  Constitutional: Negative for fever, chills, weight loss, malaise/fatigue and diaphoresis.  HENT: Negative for hearing loss, ear pain, nosebleeds, congestion, sore throat, neck pain, tinnitus and ear discharge.   Eyes: Negative for  blurred vision, double vision, photophobia, pain, discharge and redness.  Respiratory: Negative for  sputum production, shortness of breath, wheezing and stridor.   Cardiovascular: Negative for palpitations, orthopnea, claudication, leg swelling and PND.  Gastrointestinal: Negative for heartburn, nausea, vomiting, abdominal pain, diarrhea, constipation, blood in stool and melena.  Genitourinary: Negative for dysuria, urgency, frequency, hematuria and flank pain.  Musculoskeletal: Negative for myalgias, back pain, joint pain and falls.  Skin: Negative for itching and rash.  Neurological: Negative for dizziness, tingling, tremors, sensory change, speech change, focal weakness, seizures, loss of consciousness, weakness and headaches.  Endo/Heme/Allergies: Negative for environmental allergies and polydipsia. Does not bruise/bleed easily.  SUBJECTIVE:   VITAL SIGNS: Temp:  [97.5 F (36.4 C)-98.8 F (37.1 C)] 97.5 F (36.4 C) (08/11 1301) Pulse Rate:  [102-127] 104 (08/11 1301) Resp:  [15-42] 30 (08/11 1301) BP: (98-123)/(64-93) 111/83 mmHg (08/11 1301) SpO2:  [91 %-100 %] 96 % (08/11 1301) Weight:  [190 lb 11.2 oz (86.5 kg)-192 lb 10.9 oz (87.4 kg)] 190 lb 11.2 oz (86.5 kg) (08/11 0500)  PHYSICAL EXAMINATION: Gen. Pleasant, well-nourished, in no distress, normal affect ENT - no lesions, no post nasal drip Neck: No JVD, no thyromegaly, no carotid bruits Lungs: no use of accessory muscles, no dullness to percussion, RLLrales , no rhonchi  Cardiovascular: Rhythm regular, heart sounds  normal, no murmurs, no peripheral edema Abdomen: soft and non-tender, no hepatosplenomegaly, BS normal. Musculoskeletal: No deformities, no cyanosis or clubbing Neuro:  alert, non focal Skin:  Warm, no lesions/ rash    Recent Labs Lab 11/08/14 1236 11/09/14 0820 11/10/14 0341  NA 141 135 133*  K 4.9 4.1 5.2*  CL 100 99* 97*  CO2 25 21* 18*  BUN 18 22* 33*  CREATININE 1.39* 1.60* 2.03*  GLUCOSE 96  94 91    Recent Labs Lab 11/08/14 1236 11/09/14 0820 11/09/14 2139  HGB 12.2* 11.9* 12.2*  HCT 36.2* 34.3* 34.8*  WBC 10.1 11.1* 17.5*  PLT 368 311 317   Dg Chest 2 View  11/09/2014   CLINICAL DATA:  Recent pneumonia  EXAM: CHEST  2 VIEW  COMPARISON:  10/13/2014  FINDINGS: Borderline cardiomegaly. There is new nodular consolidation right perihilar region. Persistent infiltrate/ consolidation right lower lobe. Findings suspicious for recurrent multifocal pneumonia. No pulmonary edema. Follow-up to resolution after appropriate treatment recommended.  IMPRESSION: There is new nodular consolidation right perihilar region. Persistent infiltrate/ consolidation right lower lobe. Findings suspicious for recurrent multifocal pneumonia. No pulmonary edema. Follow-up to resolution after appropriate treatment recommended.   Electronically Signed   By: Natasha Mead M.D.   On: 11/09/2014 08:08    ASSESSMENT / PLAN:   Hemoptysis - RLL CAP -treated 09/2014, now with new infiltrate. Both these infiltrates appear wedge-shaped-could be related to aspirated blood. CT angiogram 7/16 is very suspicious for pulmonary infarct from distal pulmonary embolism. He does need anticoagulation for LV thrombus, hence heparin cannot be stopped-unless hemoptysis worsens. Although he does have some pleuritic pain and mild leukocytosis, I really doubt that this is infectious  Recommend- Would obtain venous duplex, if this is positive-VQ scan would not be necessary. We'll use pro-calcitonin algorithm to limit antibiotics exposure Continue heparin for now unless hemoptysis worsens Doubt need for bronchoscopy here- his pleuritic pain and imaging does localize to the right lower  lobe. No mass has been noted on prior imaging, hence repeat CT imaging not require  Cyril Mourning MD. FCCP. [facility] Pager [phone] If no response call [phone]     11/10/2014, 2:05 PM

## 2014-11-10 NOTE — Progress Notes (Deleted)
Patient refused CPT for 0000.  

## 2014-11-10 NOTE — Progress Notes (Addendum)
Heart Failure Navigator Consult Note  Presentation: Robert Booth is a 44 y.o. male presenting with cough, SOB, hemoptysis and pleuritic chest pain. PMH is significant for HTN, dilated cardiomyopathy (NICM, reduced EF) alcoholism, and anxiety.   Past Medical History  Diagnosis Date  . Hypertension   . Alcoholism     a. Sober since Feb 2016.  Marland Kitchen Anxiety   . NICM (nonischemic cardiomyopathy)     a. 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  . 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 Acute kidney injury     Social History   Social History  . Marital Status: Single    Spouse Name: N/A  . Number of Children: 6  . Years of Education: N/A   Occupational History  . Unemployed    Social History Main Topics  . Smoking status: Former Smoker    Types: Cigarettes  . Smokeless tobacco: Former Neurosurgeon    Types: Chew     Comment: Was a rare smoker  . Alcohol Use: No  . Drug Use: No  . Sexual Activity: Yes   Other Topics Concern  . None   Social History Narrative   Lives with mom.      ECHO:11/11/14 Study Conclusions  - Left ventricle: The cavity size was mildly dilated. Wall thickness was normal. Systolic function was severely reduced. The estimated ejection fraction was in the range of 5% to 10%. Severe hypokinesis of the entire myocardium. - Mitral valve: There was mild regurgitation. - Left atrium: The atrium was mildly dilated. - Right ventricle: The cavity size was moderately dilated. Systolic function was moderately reduced. - Right atrium: The atrium was moderately dilated. - Tricuspid valve: There was severe regurgitation.  Transthoracic echocardiography. M-mode, complete 2D, spectral Doppler, and color Doppler. Birthdate: Patient birthdate: 1970-09-30.  Age: Patient is 44 yr old. Sex: Gender: male. BMI: 28.8 kg/m^2. Blood pressure:   118/85 Patient status: Inpatient. Study date: Study date: 11/11/2014. Study time: 11:28 AM. Location: ICU/CCU  BNP    Component Value Date/Time   BNP 1689.6* 11/09/2014 0820    ProBNP No results found for: PROBNP   Education Assessment and Provision:  Detailed education and instructions provided on heart failure disease management including the following:  Signs and symptoms of Heart Failure When to call the physician Importance of daily weights Low sodium diet Fluid restriction Medication management Anticipated future follow-up appointments  Patient education given on each of the above topics.  Patient acknowledges understanding and acceptance of all instructions.  I spoke briefly again with Mr. Magaw.  He tells me that he remembers our conversation before (I saw him during his hospitalization in July)--yet was not weighing at home because he had no scale.  I have provided a scale for his use at home.  He says that he was "doing well" until he started having hemoptysis at home.  He tells me that he has been avoiding salt and "trying" to eat low sodium as much as possible.  [facility] with [facility] on 11[facility]14- he understands and plans to go to appt.    Education Materials:  "Living Better With Heart Failure" Booklet, Daily Weight Tracker Tool    High Risk Criteria for Readmission and/or Poor Patient Outcomes:  (Recommend Follow-up with [facility])--yes--he has  follow up appt scheduled for 11/24/14   EF <30%- yes 5-10% newly reduced from July  2 or more admissions in 6 months- Yes--recently here  Difficult social situation- yes -no insurance noted--he does have orange card  Demonstrates medication noncompliance- denies?   Barriers of Care:   Knowledge and compliance  Discharge Planning:   Plans to return to home with mother

## 2014-11-10 NOTE — Progress Notes (Signed)
ANTICOAGULATION + ANTIBIOTIC CONSULT NOTE - Follow-up  Pharmacy Consult for heparin + Vancomycin Indication: hx LV thrombus, r/o new PE; PNA  Allergies  Allergen Reactions  . Celexa [Citalopram Hydrobromide] Other (See Comments)    DISORIENTED  . Lisinopril Cough    Patient Measurements: Height: 5\' 9"  (175.3 cm) Weight: 190 lb 11.2 oz (86.5 kg) IBW/kg (Calculated) : 70.7 Heparin Dosing Weight: 87.4kg  Vital Signs: Temp: 97.9 F (36.6 C) (08/11 0835) Temp Source: Oral (08/11 0835) BP: 117/93 mmHg (08/11 0835) Pulse Rate: 109 (08/11 0835)  Labs:  Recent Labs  11/08/14 1223  11/08/14 1236  11/09/14 0820 11/09/14 1148 11/09/14 1848 11/09/14 2139 11/10/14 0224 11/10/14 0341  HGB  --   < > 12.2*  --  11.9*  --   --  12.2*  --   --   HCT  --   --  36.2*  --  34.3*  --   --  34.8*  --   --   PLT  --   --  368  --  311  --   --  317  --   --   LABPROT  --   --   --   --  20.1*  --   --   --   --  27.7*  INR 1.70  --   --   --  1.72*  --   --   --   --  2.62*  HEPARINUNFRC  --   --   --   --   --   --   --  0.30  --  0.20*  CREATININE  --   --  1.39*  --  1.60*  --   --   --   --  2.03*  TROPONINI  --   --   --   < >  --  0.05* 0.07*  --  0.09*  --   < > = values in this interval not displayed.  Estimated Creatinine Clearance: 50.6 mL/min (by C-G formula based on Cr of 2.03).  Medications:  Infusions:  . heparin 1,800 Units/hr (11/10/14 0238)    Assessment: 43 yom presented to the ED with hemoptysis. He is chronically anticoagulated with warfarin for history of LV thrombus. However, INR was subtherapeutic upon admission so IV heparin was started. Also noted elevated d-dimer so possibility of new PE as well. INR jumped up to 2.62 today for unclear reasons. Hemoptysis has improved per pt report. Per discussion with MD, will continue IV heparin for now as treating possible new PE with unclear cause for INR increase. Likely INR will fall quickly with held doses of warfarin.  Heparin level was subtherapeutic today at 0.2.   Pt is also continues on broad-spectrum antibiotics. Renal function has worsened slightly so vancomycin dose will require adjustment. Pt is afebrile and WBC is elevated at 17.5. Cultures are pending.  Vanc 8/10>> Ceftaz 8/10>>  Goal of Therapy:  Heparin level 0.3-0.7 units/ml Monitor platelets by anticoagulation protocol: Yes   Plan:  - Increase heparin gtt to 1900 units/hr - Check an 8 hour heparin level - Change vancomycin to 750mg  IV Q12H - F/u renal fxn, C&S, clinical status and trough at [facility] - Daily INR  Kearsten Ginther, Drake Leach 11/10/2014,9:31 AM

## 2014-11-10 NOTE — Progress Notes (Signed)
Utilization review completed. Adiah Guereca, RN, BSN. 

## 2014-11-10 NOTE — Progress Notes (Signed)
Family Medicine Teaching Service Daily Progress Note Intern Pager: [phone]  Patient name: Robert Booth Medical record number: [id number] Date of birth: 01-Apr-1971 Age: 44 y.o. Gender: male  Primary Care Provider: Lora Paula, MD Consultants: Cardiology Code Status: FULL  Pt Overview and Major Events to Date:  - Hospitalized 7/13 for hemoptysis treated as HCAP.  Assessment and Plan: [name] is a 44 y.o. male presenting with cough, SOB, hemoptysis and pleuritic chest pain. PMH is significant for HTN, dilated cardiomyopathy (NICM, reduced EF) alcoholism, and anxiety. Suspect pulmonary embolism given tachycardia, SOB, subtherapeutic INR on admission on coumadin, known LV thrombus, and positive D-dimer. With AKI, also considering Goodpasture's/Wegener's. Concern for acute on chronic CHF exacerbation.   # Hemoptysis: - Consult pulmonology for AVM or structural cause of bleeding - Order ANCA, ANA, c-ANCA  - Repeat CBC - V/Q scan - ECHO to assess for right heart strain - Pharmacy consulting for anticoag management. Continue heparin gtt with goal level of 0.3-0.7.  - Holding coumadin.   # Chest Pain: Continued right-sided chest pain. Troponin to 0.09 but reproducible with palpation.  - On telemetry.  - Trending troponins x3 - Repeat EKG in the a.m.  # Acute on Chronis Systolic CHF: LVEF 20% on ECHO 10/13/14. BNP elevated to 1689.6.Was 1091.1 at last admission. No crackles appreciated on exam today, but continues to have SOB when laying down.  - Give 60 mg lasix as needed.  - Obtain daily weights and strict I/Os - Cardiology consulted. Appreciate recommendations. Start spironolactone on discharge per recs.   # Lower Extremity Swelling: R > L - Ordered LE venous dopplers.  # AKI: 2.03 > 1.6 (1.22 in July) - Ordered renal U/S - Continue holding losartan, lasix  - Consider gentle fluid resuscitation if worsens.   # Increased INR: 2.62 > 1.72 - Continue heparin gtt given  high suspicion for PE and known left ventricular thrombus.  - Pharmacy recommends increasing heparin dose, as level was 2.1, whereas goal is 0.3-0.7.   # Nausea - Promethazine ordered q6h prn. Limit use given QTc>500.  # HCAP: Patient afebrile. CXR with new nodular consolidation.  - Continue IV vanc and ceftaz per pharmacy. Likely switch to PO tomorrow, 8/12.  #HTN: Continues to be normotensive - Continue home metoprolol.   #SIRS - F/u blood cultures   # H/o Anxiety: not currently medically treated  # Alcohol Abuse: Denies ETOH use, though mother present in room. - CIWA protocol.   FEN/GI: heart healthy diet  PPx: protonix 40 mg daily  Disposition: Admitted to the [facility] on telemetry.   Subjective:  Robert Booth continues to have right-sided chest pain. He feels fine this morning but states he has to sit up in order to breath comfortably. He says the amount of blood he has been coughing up has decreased. About half a teaspoon of bright red blood is in his suction canister.   Objective: Temp:  [97.9 F (36.6 C)-98.8 F (37.1 C)] 97.9 F (36.6 C) (08/11 0835) Pulse Rate:  [102-135] 109 (08/11 0835) Resp:  [15-42] 29 (08/11 0835) BP: (98-136)/(64-99) 117/93 mmHg (08/11 0835) SpO2:  [91 %-100 %] 97 % (08/11 0835) Weight:  [190 lb 11.2 oz (86.5 kg)-192 lb 10.9 oz (87.4 kg)] 190 lb 11.2 oz (86.5 kg) (08/11 0500) Physical Exam: General: Sleeping sitting up in bed. Cardiovascular: Tachycardic. Regular rhythm, no murmurs, rubs or gallops. Brisk cap refill.  Respiratory: Expiratory wheezes throughout all lung fields. No crackles appreciated.  Abdomen:  Soft, non-tender, nondistended, positive BS.  Extremities: 3+ pitting edema of medial malleolus of RLE, 1+ beyond ankle up to mid shin. Trace edema of LLE. DP pulse 1+ on right, 2+ on left.  Neuro: Easy to arouse from sleep. Follows commands. Asks appropriate questions.  Skin: WWP.   Laboratory:  Recent  Labs Lab 11/08/14 1236 11/09/14 0820 11/09/14 2139  WBC 10.1 11.1* 17.5*  HGB 12.2* 11.9* 12.2*  HCT 36.2* 34.3* 34.8*  PLT 368 311 317    Recent Labs Lab 11/08/14 1236 11/09/14 0820 11/10/14 0341  NA 141 135 133*  K 4.9 4.1 5.2*  CL 100 99* 97*  CO2 25 21* 18*  BUN 18 22* 33*  CREATININE 1.39* 1.60* 2.03*  CALCIUM 8.8 8.9 8.7*  GLUCOSE 96 94 91    Imaging/Diagnostic Tests:  Dg Chest 2 View  11/09/2014   CLINICAL DATA:  Recent pneumonia  EXAM: CHEST  2 VIEW  COMPARISON:  10/13/2014  FINDINGS: Borderline cardiomegaly. There is new nodular consolidation right perihilar region. Persistent infiltrate/ consolidation right lower lobe. Findings suspicious for recurrent multifocal pneumonia. No pulmonary edema. Follow-up to resolution after appropriate treatment recommended.  IMPRESSION: There is new nodular consolidation right perihilar region. Persistent infiltrate/ consolidation right lower lobe. Findings suspicious for recurrent multifocal pneumonia. No pulmonary edema. Follow-up to resolution after appropriate treatment recommended.   Electronically Signed   By: Natasha Mead M.D.   On: 11/09/2014 08:08   X-ray Chest Pa And Lateral  10/13/2014   CLINICAL DATA:  Hemoptysis, community-acquired pneumonia, CHF, previous tobacco use.  EXAM: CHEST  2 VIEW  COMPARISON:  Chest x-ray and chest CT scan of October 12, 2014  FINDINGS: There is increased density in the right lower lobe with further obscuration of the hemidiaphragm. An air bronchogram is visible on the lateral film. The right middle and upper lobes are clear as is the left lung. The cardiac silhouette is mildly enlarged. The central pulmonary vascularity is prominent though stable. The bony thorax is unremarkable.  IMPRESSION: Increased opacity in the right lower lobe consistent with pneumonia. Mild cardiomegaly without pulmonary edema.   Electronically Signed   By: David  [place]azil[place] M.D.   On: 10/13/2014 07:22   Dg Chest 2 View  10/12/2014    CLINICAL DATA:  44 year old male with 1 week history of productive cough and 1 day history of hemoptysis accompanied by right flank and upper back pain.  EXAM: CHEST  2 VIEW  COMPARISON:  None.  FINDINGS: Focal patchy airspace opacity in the anterior aspect of the right lower lobe concerning for bronchopneumonia. Otherwise, the lungs are clear. The cardiac and mediastinal contours are within normal limits. Osseous structures are intact and unremarkable.  IMPRESSION: Imaging findings are most consistent with right lower lobe pneumonia.   Electronically Signed   By: Malachy Moan M.D.   On: 10/12/2014 15:55   Ct Angio Chest Pe W/cm &/or Wo Cm  10/12/2014   CLINICAL DATA:  44 year old male with right-sided chest pain and shortness of breath  EXAM: CT ANGIOGRAPHY CHEST WITH CONTRAST  TECHNIQUE: Multidetector CT imaging of the chest was performed using the standard protocol during bolus administration of intravenous contrast. Multiplanar CT image reconstructions and MIPs were obtained to evaluate the vascular anatomy.  CONTRAST:  OMNIPAQUE IOHEXOL 350 MG/ML SOLN  COMPARISON:  Chest radiograph dated 10/12/2014  FINDINGS: There is a patchy area ground-glass and nodular opacities in the right lower lobe. There is non opacification of a distal subsegmental branch of the  pulmonary artery extending to this consolidated portion of the right lower lobe. This may be artifactual or represent hypoperfusion secondary to hypoventilation. Distal branch pulmonary artery embolus is not excluded. Correlation with clinical exam and signs and symptoms for pneumonia recommended. No other pulmonary embolus identified. There is a small right pleural effusion. Small patchy areas of low-attenuation in the left upper and left lower lobe most compatible with air trapping. The central airways are patent.  The visualized thoracic aorta appear unremarkable. Mild cardiomegaly. No pericardial effusion. No hilar or mediastinal  lymphadenopathy. The upper mediastinum appears unremarkable. The chest wall and the visualized upper abdomen is unremarkable. No acute fracture.  Review of the MIP images confirms the above findings.  IMPRESSION: Patchy right lower lobe consolidation most compatible with pneumonia and less likely representing infarct. Apparent non-opacification of the distal subsegmental pulmonary artery branch extending into the consolidated area may be artifactual or represent focal hypoperfusion. A distal branch pulmonary embolism is less likely, given no other pulmonary embolus identified, but not entirely excluded. Correlation with clinical exam and sign and symptoms of pneumonia recommended.   Electronically Signed   By: Elgie Collard M.D.   On: 10/12/2014 18:27   Nm Pulmonary Perf And Vent  11/10/2014   CLINICAL DATA:  Shortness of breath, chest pain, hemoptysis, history hypertension, non ischemic cardiomyopathy, former smoker  EXAM: NUCLEAR MEDICINE VENTILATION - PERFUSION LUNG SCAN  TECHNIQUE: Ventilation images were obtained in multiple projections using inhaled aerosol Tc-85m DTPA. Perfusion images were obtained in multiple projections after intravenous injection of Tc-27m MAA.  RADIOPHARMACEUTICALS:  39.0 mCi Technetium-99m DTPA aerosol inhalation and 5.9 mCi Technetium-32m MAA IV  COMPARISON:  None  Correlation: Chest radiograph 11/09/2014  FINDINGS: Ventilation: Impaired ventilation identified in RIGHT middle and RIGHT lower lobes. RIGHT lower lobe absent perfusion corresponds to area of radiographic abnormality on preceding chest radiograph and recent CT. No abnormalities at RIGHT middle lobe on recent chest radiograph. Heart appears enlarged.  Perfusion: Matching diminished perfusion involving the RIGHT middle and RIGHT lower lobes. No additional areas of segmental or subsegmental perfusion decrease. Heart appears enlarged.  Chest radiograph: Significant RIGHT lower lobe infiltrate. No definite RIGHT middle  lobe infiltrate.  IMPRESSION: Matching decreased ventilation, decreased perfusion, and radiographic abnormalities in RIGHT lower lobe.  Matching decreased subsegmental ventilation and perfusion abnormalities in RIGHT middle lobe with clear chest radiograph.  No perfusion abnormalities of the LEFT lung.  Findings represent an intermediate probability of pulmonary embolism.   Electronically Signed   By: Ulyses Southward M.D.   On: 11/10/2014 15:37    Hillary Percell Boston, MD 11/10/2014, 9:30 AM PGY-1, [facility] Family Medicine FPTS Intern pager: [phone], text pages welcome

## 2014-11-10 NOTE — Progress Notes (Signed)
Patient Name: Robert Booth Date of Encounter: 11/10/2014   SUBJECTIVE  Right upper chest pain with palpation and deep breath. SOB stable with sitting, worse with laying down. Feels better day.   CURRENT MEDS . cefTAZidime (FORTAZ)  IV  2 g Intravenous Q8H  . Chlorhexidine Gluconate Cloth  6 each Topical Q0600  . fentaNYL (SUBLIMAZE) injection  12.5 mcg Intravenous Once  . folic acid  1 mg Oral Daily  . metoprolol succinate  100 mg Oral Daily  . multivitamin with minerals  1 tablet Oral Daily  . mupirocin ointment  1 application Nasal BID  . sodium chloride  3 mL Intravenous Q12H  . thiamine  100 mg Oral Daily   Or  . thiamine  100 mg Intravenous Daily  . vancomycin  1,000 mg Intravenous Q12H    OBJECTIVE  Filed Vitals:   11/09/14 2048 11/10/14 0000 11/10/14 0352 11/10/14 0500  BP: 110/80 123/88 120/93   Pulse: 110 102 103   Temp: 98.8 F (37.1 C) 98.5 F (36.9 C) 97.9 F (36.6 C)   TempSrc: Oral Oral Oral   Resp: 24 18 32   Height:  (1.753 m)     Weight: 192 lb 10.9 oz (87.4 kg)   190 lb 11.2 oz (86.5 kg)  SpO2: 94% 97% 96%     Intake/Output Summary (Last 24 hours) at 11/10/14 0701 Last data filed at 11/10/14 0600  Gross per 24 hour  Intake 1144.32 ml  Output    850 ml  Net 294.32 ml   Filed Weights   11/09/14 2048 11/10/14 0500  Weight: 192 lb 10.9 oz (87.4 kg) 190 lb 11.2 oz (86.5 kg)    PHYSICAL EXAM  General: Pleasant, NAD. Neuro: Alert and oriented X 3. Moves all extremities spontaneously. Psych: Normal affect. HEENT:  Normal  Neck: Supple without bruits or JVD. Lungs:  Resp regular and unlabored. Bibasilar crackles.  Heart: RR with tachycardia, no s3, s4, +systolic murmurs. Abdomen: Soft, non-tender, non-distended, BS + x 4.  Extremities: No clubbing, cyanosis. 1+  edema. DP/PT/Radials 2+ and equal bilaterally.  Accessory Clinical Findings  CBC  Recent Labs  11/09/14 0820 11/09/14 2139  WBC 11.1* 17.5*  NEUTROABS 8.9*  --   HGB  11.9* 12.2*  HCT 34.3* 34.8*  MCV 86.0 84.7  PLT 311 317   Basic Metabolic Panel  Recent Labs  11/09/14 0820 11/10/14 0341  NA 135 133*  K 4.1 5.2*  CL 99* 97*  CO2 21* 18*  GLUCOSE 94 91  BUN 22* 33*  CREATININE 1.60* 2.03*  CALCIUM 8.9 8.7*   Liver Function Tests No results for input(s): AST, ALT, ALKPHOS, BILITOT, PROT, ALBUMIN in the last 72 hours. No results for input(s): LIPASE, AMYLASE in the last 72 hours. Cardiac Enzymes  Recent Labs  11/09/14 1148 11/09/14 1848 11/10/14 0224  TROPONINI 0.05* 0.07* 0.09*   BNP Invalid input(s): POCBNP D-Dimer  Recent Labs  11/09/14 1148  DDIMER 1.82*    TELE  Sinus tachy with few PVCs.   Radiology/Studies  Dg Chest 2 View  11/09/2014   CLINICAL DATA:  Recent pneumonia  EXAM: CHEST  2 VIEW  COMPARISON:  10/13/2014  FINDINGS: Borderline cardiomegaly. There is new nodular consolidation right perihilar region. Persistent infiltrate/ consolidation right lower lobe. Findings suspicious for recurrent multifocal pneumonia. No pulmonary edema. Follow-up to resolution after appropriate treatment recommended.  IMPRESSION: There is new nodular consolidation right perihilar region. Persistent infiltrate/ consolidation right lower lobe. Findings suspicious for recurrent  multifocal pneumonia. No pulmonary edema. Follow-up to resolution after appropriate treatment recommended.   Electronically Signed   By: Natasha Mead M.D.   On: 11/09/2014 08:08   X-ray Chest Pa And Lateral  10/13/2014   CLINICAL DATA:  Hemoptysis, community-acquired pneumonia, CHF, previous tobacco use.  EXAM: CHEST  2 VIEW  COMPARISON:  Chest x-ray and chest CT scan of October 12, 2014  FINDINGS: There is increased density in the right lower lobe with further obscuration of the hemidiaphragm. An air bronchogram is visible on the lateral film. The right middle and upper lobes are clear as is the left lung. The cardiac silhouette is mildly enlarged. The central pulmonary  vascularity is prominent though stable. The bony thorax is unremarkable.  IMPRESSION: Increased opacity in the right lower lobe consistent with pneumonia. Mild cardiomegaly without pulmonary edema.   Electronically Signed   By: David  [place] M.D.   On: 10/13/2014 07:22   Dg Chest 2 View  10/12/2014   CLINICAL DATA:  44 year old male with 1 week history of productive cough and 1 day history of hemoptysis accompanied by right flank and upper back pain.  EXAM: CHEST  2 VIEW  COMPARISON:  None.  FINDINGS: Focal patchy airspace opacity in the anterior aspect of the right lower lobe concerning for bronchopneumonia. Otherwise, the lungs are clear. The cardiac and mediastinal contours are within normal limits. Osseous structures are intact and unremarkable.  IMPRESSION: Imaging findings are most consistent with right lower lobe pneumonia.   Electronically Signed   By: Malachy Moan M.D.   On: 10/12/2014 15:55   Ct Angio Chest Pe W/cm &/or Wo Cm  10/12/2014   CLINICAL DATA:  44 year old male with right-sided chest pain and shortness of breath  EXAM: CT ANGIOGRAPHY CHEST WITH CONTRAST  TECHNIQUE: Multidetector CT imaging of the chest was performed using the standard protocol during bolus administration of intravenous contrast. Multiplanar CT image reconstructions and MIPs were obtained to evaluate the vascular anatomy.  CONTRAST:  OMNIPAQUE IOHEXOL 350 MG/ML SOLN  COMPARISON:  Chest radiograph dated 10/12/2014  FINDINGS: There is a patchy area ground-glass and nodular opacities in the right lower lobe. There is non opacification of a distal subsegmental branch of the pulmonary artery extending to this consolidated portion of the right lower lobe. This may be artifactual or represent hypoperfusion secondary to hypoventilation. Distal branch pulmonary artery embolus is not excluded. Correlation with clinical exam and signs and symptoms for pneumonia recommended. No other pulmonary embolus identified. There is a  small right pleural effusion. Small patchy areas of low-attenuation in the left upper and left lower lobe most compatible with air trapping. The central airways are patent.  The visualized thoracic aorta appear unremarkable. Mild cardiomegaly. No pericardial effusion. No hilar or mediastinal lymphadenopathy. The upper mediastinum appears unremarkable. The chest wall and the visualized upper abdomen is unremarkable. No acute fracture.  Review of the MIP images confirms the above findings.  IMPRESSION: Patchy right lower lobe consolidation most compatible with pneumonia and less likely representing infarct. Apparent non-opacification of the distal subsegmental pulmonary artery branch extending into the consolidated area may be artifactual or represent focal hypoperfusion. A distal branch pulmonary embolism is less likely, given no other pulmonary embolus identified, but not entirely excluded. Correlation with clinical exam and sign and symptoms of pneumonia recommended.   Electronically Signed   By: Elgie Collard M.D.   On: 10/12/2014 18:27    Echo 11/10/14 Indications:   CHF - 428.0.  ------------------------------------------------------------------- History:  Risk factors: Hypertension.  ------------------------------------------------------------------- Study Conclusions  - Left ventricle: Large chronic appearing partially calcified mural apical and apical lateral thrombus. The cavity size was severely dilated. Wall thickness was normal. The estimated ejection fraction was 20%. - Left atrium: The atrium was moderately dilated. - Atrial septum: No defect or patent foramen ovale was identified.   ASSESSMENT AND PLAN  1. Acute on chronic systolic CHF (congestive heart failure), NYHA class 4 - given  Lasix 60 mg IV BID yesterday - Net I/O +235ml, however weight down 2lb. Suspects accurate I/O. Still volume overload on exam. Discontinued Lasix today.  - BNP of 1689.6. Creatinine  increase to 2.03 from 1.6.  - held ARB - Continue Toprol XL 100 as BP will allow. - likely start spironolactone prior to discharge if renal function permits. After 3 months of consecutive, optimal medical management he will need an evaluation for ICD  2. Acute on chronic renal failure. - Held Losartan. Continue to follow. With worsening creatinine, consider renal US.   3. Elevated troponin - mildly elevated. 0.05->0.07-->0.09. Likely demand in setting of pneumonia, hemoptysis and acute CHF. R sided chest pain with cough and deep breath. D-dimer of 1.82. He is tachycardia. Suspicious for PE. Consider VQ scan. Continue IV heparin. EKG now with a more prominent TWI in lateral leads. Low suspicious for ACS, however will get another set of troponin x 3. No Left sided chest pain.  - Recommended pulmonary consult.   4. HTN - Relatively stable   Signed, Bhagat,Bhavinkumar PA-C Pager [phone] Patient seen. Chest xrays and labs reviewed.  His chest pain is pleuritic and located in right lateral chest. Pain does not suggest ACS.  He has been on long term warfarin although at his last clinic visit and on admission he was subtherapeutic at 1.7.  The chest pain and hemoptysis likely secondary to his RLL infiltrate (? Pneumonia?) although pulmonary embolus is a possibility.  He has also had recent swelling of right foot. Would consider pulmonary consult, consider VQ lung scan and venous dopplers of legs. Agree with holding lasix for now in view or worsening renal function.

## 2014-11-11 ENCOUNTER — Inpatient Hospital Stay (HOSPITAL_COMMUNITY): Payer: Medicaid Other

## 2014-11-11 DIAGNOSIS — R945 Abnormal results of liver function studies: Secondary | ICD-10-CM

## 2014-11-11 DIAGNOSIS — R079 Chest pain, unspecified: Secondary | ICD-10-CM

## 2014-11-11 DIAGNOSIS — R570 Cardiogenic shock: Secondary | ICD-10-CM | POA: Insufficient documentation

## 2014-11-11 DIAGNOSIS — R7989 Other specified abnormal findings of blood chemistry: Secondary | ICD-10-CM | POA: Insufficient documentation

## 2014-11-11 DIAGNOSIS — R609 Edema, unspecified: Secondary | ICD-10-CM

## 2014-11-11 LAB — COMPREHENSIVE METABOLIC PANEL
ALT: 1251 U/L — ABNORMAL HIGH (ref 17–63)
AST: 2616 U/L — ABNORMAL HIGH (ref 15–41)
Albumin: 2.8 g/dL — ABNORMAL LOW (ref 3.5–5.0)
Alkaline Phosphatase: 73 U/L (ref 38–126)
Anion gap: 12 (ref 5–15)
BUN: 43 mg/dL — ABNORMAL HIGH (ref 6–20)
CALCIUM: 8.8 mg/dL — AB (ref 8.9–10.3)
CO2: 25 mmol/L (ref 22–32)
Chloride: 95 mmol/L — ABNORMAL LOW (ref 101–111)
Creatinine, Ser: 2.41 mg/dL — ABNORMAL HIGH (ref 0.61–1.24)
GFR calc non Af Amer: 31 mL/min — ABNORMAL LOW (ref >60)
GFR, EST AFRICAN AMERICAN: 36 mL/min — AB (ref >60)
Glucose, Bld: 95 mg/dL (ref 65–99)
Potassium: 4.5 mmol/L (ref 3.5–5.1)
SODIUM: 132 mmol/L — AB (ref 135–145)
TOTAL PROTEIN: 6.4 g/dL — AB (ref 6.5–8.1)
Total Bilirubin: 3.5 mg/dL — ABNORMAL HIGH (ref 0.3–1.2)

## 2014-11-11 LAB — CARBOXYHEMOGLOBIN
CARBOXYHEMOGLOBIN: 1.3 % (ref 0.5–1.5)
Carboxyhemoglobin: 0.7 % (ref 0.5–1.5)
Carboxyhemoglobin: 1 % (ref 0.5–1.5)
METHEMOGLOBIN: 1.3 % (ref 0.0–1.5)
METHEMOGLOBIN: 0.9 % (ref 0.0–1.5)
Methemoglobin: 0.9 % (ref 0.0–1.5)
O2 SAT: 38.5 %
O2 Saturation: 27.4 %
O2 Saturation: 50.4 %
TOTAL HEMOGLOBIN: 11.5 g/dL — AB (ref 13.5–18.0)
Total hemoglobin: 12.2 g/dL — ABNORMAL LOW (ref 13.5–18.0)
Total hemoglobin: 11.5 g/dL — ABNORMAL LOW (ref 13.5–18.0)

## 2014-11-11 LAB — RAPID URINE DRUG SCREEN, HOSP PERFORMED
Amphetamines: NOT DETECTED
Barbiturates: NOT DETECTED
Benzodiazepines: NOT DETECTED
Cocaine: NOT DETECTED
Opiates: POSITIVE — AB
TETRAHYDROCANNABINOL: NOT DETECTED

## 2014-11-11 LAB — URINALYSIS, ROUTINE W REFLEX MICROSCOPIC
Glucose, UA: NEGATIVE mg/dL
KETONES UR: 15 mg/dL — AB
Leukocytes, UA: NEGATIVE
NITRITE: NEGATIVE
PH: 5.5 (ref 5.0–8.0)
PROTEIN: 100 mg/dL — AB
SPECIFIC GRAVITY, URINE: 1.025 (ref 1.005–1.030)
UROBILINOGEN UA: 0.2 mg/dL (ref 0.0–1.0)

## 2014-11-11 LAB — CBC
HCT: 34.2 % — ABNORMAL LOW (ref 39.0–52.0)
HEMOGLOBIN: 12.1 g/dL — AB (ref 13.0–17.0)
MCH: 29.6 pg (ref 26.0–34.0)
MCHC: 35.4 g/dL (ref 30.0–36.0)
MCV: 83.6 fL (ref 78.0–100.0)
Platelets: 308 10*3/uL (ref 150–400)
RBC: 4.09 MIL/uL — ABNORMAL LOW (ref 4.22–5.81)
RDW: 14.9 % (ref 11.5–15.5)
WBC: 15.8 10*3/uL — ABNORMAL HIGH (ref 4.0–10.5)

## 2014-11-11 LAB — URINE MICROSCOPIC-ADD ON

## 2014-11-11 LAB — ANCA TITERS: C-ANCA: <1:20 {titer}

## 2014-11-11 LAB — ANTINUCLEAR ANTIBODIES, IFA: ANA Ab, IFA: NEGATIVE

## 2014-11-11 LAB — HEPARIN LEVEL (UNFRACTIONATED): HEPARIN UNFRACTIONATED: 0.35 [IU]/mL (ref 0.30–0.70)

## 2014-11-11 LAB — PROCALCITONIN
PROCALCITONIN: 8.41 ng/mL
Procalcitonin: 8.33 ng/mL

## 2014-11-11 LAB — PROTIME-INR
INR: 3.29 — ABNORMAL HIGH (ref 0.00–1.49)
Prothrombin Time: 32.8 seconds — ABNORMAL HIGH (ref 11.6–15.2)

## 2014-11-11 LAB — ACETAMINOPHEN LEVEL

## 2014-11-11 LAB — SALICYLATE LEVEL

## 2014-11-11 MED ORDER — FENTANYL CITRATE (PF) 100 MCG/2ML IJ SOLN
INTRAMUSCULAR | Status: AC
  Administered 2014-11-11: 25 ug via INTRAVENOUS
  Filled 2014-11-11: qty 2

## 2014-11-11 MED ORDER — NOREPINEPHRINE BITARTRATE 1 MG/ML IV SOLN
5.0000 ug/min | INTRAVENOUS | Status: DC
  Administered 2014-11-11: 5 ug/min via INTRAVENOUS
  Filled 2014-11-11 (×2): qty 4

## 2014-11-11 MED ORDER — FENTANYL CITRATE (PF) 100 MCG/2ML IJ SOLN
25.0000 ug | Freq: Once | INTRAMUSCULAR | Status: AC
  Administered 2014-11-11: 25 ug via INTRAVENOUS

## 2014-11-11 MED ORDER — FUROSEMIDE 10 MG/ML IJ SOLN
80.0000 mg | Freq: Two times a day (BID) | INTRAMUSCULAR | Status: DC
  Administered 2014-11-11 – 2014-11-13 (×4): 80 mg via INTRAVENOUS
  Filled 2014-11-11 (×5): qty 8

## 2014-11-11 MED ORDER — MILRINONE IN DEXTROSE 20 MG/100ML IV SOLN
0.1250 ug/kg/min | INTRAVENOUS | Status: AC
  Administered 2014-11-11: 0.25 ug/kg/min via INTRAVENOUS
  Administered 2014-11-12 – 2014-11-14 (×4): 0.375 ug/kg/min via INTRAVENOUS
  Administered 2014-11-14: 0.25 ug/kg/min via INTRAVENOUS
  Administered 2014-11-15: 0.125 ug/kg/min via INTRAVENOUS
  Filled 2014-11-11 (×9): qty 100

## 2014-11-11 MED ORDER — DEXTROSE 5 % IV SOLN
2.0000 g | Freq: Two times a day (BID) | INTRAVENOUS | Status: DC
  Administered 2014-11-11 – 2014-11-13 (×4): 2 g via INTRAVENOUS
  Filled 2014-11-11 (×5): qty 2

## 2014-11-11 MED ORDER — FUROSEMIDE 10 MG/ML IJ SOLN
INTRAMUSCULAR | Status: AC
  Administered 2014-11-11: 80 mg
  Filled 2014-11-11: qty 8

## 2014-11-11 NOTE — Progress Notes (Signed)
*  PRELIMINARY RESULTS* Vascular Ultrasound Lower extremity venous duplex has been completed.  Preliminary findings: negative for DVT. Pulsatile venous flow suggests increased right sided heart pressure.   Farrel Demark, RDMS, RVT  11/11/2014, 9:19 AM

## 2014-11-11 NOTE — Progress Notes (Deleted)
Name: Robert Booth MRN: [id number] DOB: Dec 18, 1970    ADMISSION DATE:  11/09/2014 CONSULTATION DATE:  11/11/2014  REFERRING MD :  Danise Edge  CHIEF COMPLAINT:  hemoptysis   HISTORY OF PRESENT ILLNESS:  44 year old with dilated cardiomyopathy diagnosed in 05/2014, maintained on anticoagulation for mural LV thrombus. He was undergoing care at [facility] and now has transferred to [facility]. 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 He was admitted in 09/2014 for right-sided pleuritic chest pain and hemoptysis, chest x-ray showed right lower lobe infiltrate, CT chest showed patchy right lower lobe consolidation, interestingly disappears wedge-shaped on my review, and there was some focal hypoperfusion described on CT angio-and the question of distal pulmonary embolism was raised. He is treated with antibiotics, hemoptysis resolved chest pain improved for about 2 weeks and then recurred for the last 4 days. He has been therapeutic on Coumadin-on interim checks-but on admit his INR was 1.7. He reports frank hemoptysis-less than 5 mL per day. He has been maintained on heparin. Chest x-ray shows new nodular consolidation right perihilar and persistent infiltrate in the right lower lobe.  Unfortunately due to diuretics his renal function has worsened, lowest creatinine was 1.2 now up to 2.0   he is being treated with ceftaz and vancomycin for HCAP  SIGNIFICANT EVENTS  7/13 - admitted for hemoptysis-treated as CAP 8/10 adm for hemoptysis  STUDIES:  CT angio 7/ 13 >> Patchy right lower lobe consolidation most compatible with pneumonia and less likely representing infarct. Apparent non-opacification of the distal subsegmental pulmonary artery branch extending into the consolidated area may be artifactual or represent focal hypoperfusion ? Distal PE 8/11 VQ with r perfusion defect 8/11 LEDS negative  SUBJECTIVE: Decreased hemoptysis  VITAL SIGNS: Temp:  [97.3 F (36.3  C)-98 F (36.7 C)] 98 F (36.7 C) (08/12 0840) Pulse Rate:  [52-109] 92 (08/12 0840) Resp:  [0-30] 15 (08/12 0840) BP: (102-118)/(79-87) 118/85 mmHg (08/12 0800) SpO2:  [84 %-100 %] 98 % (08/12 0840) Weight:  [195 lb 15.8 oz (88.9 kg)] 195 lb 15.8 oz (88.9 kg) (08/12 0427)  PHYSICAL EXAMINATION: Gen. Pleasant, well-nourished, in no distress, normal affect, reports decrease in hemoptysis. ENT - no lesions, no post nasal drip Neck: No JVD, no thyromegaly, no carotid bruits Lungs: no use of accessory muscles, no dullness to percussion, RLLrales , no rhonchi  Cardiovascular: Rhythm regular, heart sounds  normal, no murmurs, no peripheral edema Abdomen: soft and non-tender, no hepatosplenomegaly, BS normal. Musculoskeletal: No deformities, no cyanosis or clubbing Neuro:  alert, non focal Skin:  Warm, no lesions/ rash    Recent Labs Lab 11/09/14 0820 11/10/14 0341 11/11/14 0223  NA 135 133* 132*  K 4.1 5.2* 4.5  CL 99* 97* 95*  CO2 21* 18* 25  BUN 22* 33* 43*  CREATININE 1.60* 2.03* 2.41*  GLUCOSE 94 91 95    Recent Labs Lab 11/09/14 2139 11/10/14 1540 11/11/14 0223  HGB 12.2* 12.9* 12.1*  HCT 34.8* 37.0* 34.2*  WBC 17.5* 18.3* 15.8*  PLT 317 293 308   US Renal  11/10/2014   CLINICAL DATA:  Acute kidney injury  EXAM: RENAL / URINARY TRACT ULTRASOUND COMPLETE  COMPARISON:  None.  FINDINGS: Right Kidney:  Length: 11.7 cm. Echogenicity within normal limits. No mass or hydronephrosis visualized.  Left Kidney:  Length: 13.1 cm. Echogenicity within normal limits. No mass or hydronephrosis visualized.  Bladder:  Bladder is decompressed and therefore not evaluated.  IMPRESSION: Kidneys appear normal.  Bladder not  evaluated.   Electronically Signed   By: Esperanza Heir M.D.   On: 11/10/2014 16:54   Nm Pulmonary Perf And Vent  11/10/2014   CLINICAL DATA:  Shortness of breath, chest pain, hemoptysis, history hypertension, non ischemic cardiomyopathy, former smoker  EXAM: NUCLEAR  MEDICINE VENTILATION - PERFUSION LUNG SCAN  TECHNIQUE: Ventilation images were obtained in multiple projections using inhaled aerosol Tc-65m DTPA. Perfusion images were obtained in multiple projections after intravenous injection of Tc-38m MAA.  RADIOPHARMACEUTICALS:  39.0 mCi Technetium-3m DTPA aerosol inhalation and 5.9 mCi Technetium-74m MAA IV  COMPARISON:  None  Correlation: Chest radiograph 11/09/2014  FINDINGS: Ventilation: Impaired ventilation identified in RIGHT middle and RIGHT lower lobes. RIGHT lower lobe absent perfusion corresponds to area of radiographic abnormality on preceding chest radiograph and recent CT. No abnormalities at RIGHT middle lobe on recent chest radiograph. Heart appears enlarged.  Perfusion: Matching diminished perfusion involving the RIGHT middle and RIGHT lower lobes. No additional areas of segmental or subsegmental perfusion decrease. Heart appears enlarged.  Chest radiograph: Significant RIGHT lower lobe infiltrate. No definite RIGHT middle lobe infiltrate.  IMPRESSION: Matching decreased ventilation, decreased perfusion, and radiographic abnormalities in RIGHT lower lobe.  Matching decreased subsegmental ventilation and perfusion abnormalities in RIGHT middle lobe with clear chest radiograph.  No perfusion abnormalities of the LEFT lung.  Findings represent an intermediate probability of pulmonary embolism.   Electronically Signed   By: Ulyses Southward M.D.   On: 11/10/2014 15:37    ASSESSMENT / PLAN:   Hemoptysis - RLL CAP -treated 09/2014, now with new infiltrate. Both these infiltrates appear wedge-shaped-could be related to aspirated blood. CT angiogram 7/16 is very suspicious for pulmonary infarct from distal pulmonary embolism. He does need anticoagulation for LV thrombus, hence heparin cannot be stopped-unless hemoptysis worsens. Although he does have some pleuritic pain and mild leukocytosis, I really doubt that this is infectious  Recommend-  venous duplex is  negative, if this is positive-VQ scan shows right probably perfusion defect . We'll use pro-calcitonin algorithm to limit antibiotics exposure(8.41) day 2/x V/Fortaz Continue heparin for now unless hemoptysis worsens, better 8/12(micro pending) Doubt need for bronchoscopy here- his pleuritic pain and imaging does localize to the right lower lobe. No mass has been noted on prior imaging, hence repeat CT imaging not required   [facility] Yuan Gann ACNP [facility] PCCM Pager [phone] till 3 pm If no answer page [phone] 11/11/2014, 10:44 AM

## 2014-11-11 NOTE — Consult Note (Signed)
Robert Booth is an 44 y.o. male referred by Dr Gwendolyn Grant   Chief Complaint: Acute on CKD 3 HPI: 43yo BM admitted for persistent hemoptysis after recent hospitalization for similar sxs thought to be due to PNA.  His Scr 2014 was .95 with Nl UA.  In 10/12/14 Scr was 1.56 and during recent hospitalization scr ran between 1.2 and 1.7.  On 11/08/14 Orason 1.39 and has increased to 2.4.  Renal US unremarkable.  No UA done since 2014. On coumadin for Lt Vent thrombus probably related to severe cardiomyopathy (poss due to hx ETOH abuse).  He was on losartan at time of DC last month.  HTN since 2001 under variable control.  No hx gross hematuria, stones or use of NSAID's.   Past Medical History  Diagnosis Date  . Hypertension   . Alcoholism     a. Sober since Feb 2016.  Marland Kitchen Anxiety   . NICM (nonischemic cardiomyopathy)     a. 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  . 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 Acute kidney injury     Past Surgical History  Procedure Laterality Date  . Hernia repair Right Inguinal  . Knee surgery Right 1998  . Cardiac catheterization  05/2014    In [place], clean cors    Family History  Problem Relation Age of Onset  . Hypertension Mother   . Hypertension Father   . Heart attack Father 46    died  . Hypertension Sister   . Heart attack Paternal Grandfather 50    died  No FH of renal ds  Social History:  reports that he has quit smoking. His smoking use included Cigarettes. He has quit using smokeless tobacco. His smokeless tobacco use included Chew. He reports that he does not drink alcohol or use illicit drugs. Separated, has 6 kids.  Does not work  Allergies:  Allergies  Allergen Reactions  . Celexa [Citalopram Hydrobromide] Other (See Comments)    DISORIENTED   . Lisinopril Cough    Medications Prior to Admission  Medication Sig Dispense Refill  . furosemide (LASIX) 40 MG tablet 40 mg by mouth daily, take an extra 40 mg dose 6 hrs later as needed for weight gain 60 tablet 2  . levofloxacin (LEVAQUIN) 500 MG tablet Take 1 tablet (500 mg total) by mouth daily. 7 tablet 0  . losartan (COZAAR) 50 MG tablet Take 1 tablet (50 mg total) by mouth daily. 30 tablet 5  . metoprolol succinate (TOPROL-XL) 100 MG 24 hr tablet Take 1 tablet (100 mg total) by mouth daily. Take with or immediately following a meal. 30 tablet 3  . warfarin (COUMADIN) 7.5 MG tablet Take 1 tablet (7.5 mg total) by mouth daily. 30 tablet 0  . guaiFENesin-codeine 100-10 MG/5ML syrup Take 10 mLs by mouth every 4 (four) hours as needed for cough.       Lab Results: UA: ND   Recent Labs  11/09/14 2139 11/10/14 1540 11/11/14 0223  WBC 17.5* 18.3* 15.8*  HGB 12.2* 12.9* 12.1*  HCT 34.8* 37.0* 34.2*  PLT 317 293 308   BMET  Recent Labs  11/09/14 0820 11/10/14 0341 11/11/14 0223  NA 135 133* 132*  K 4.1 5.2* 4.5  CL 99* 97* 95*  CO2 21* 18* 25  GLUCOSE  94 91 95  BUN 22* 33* 43*  CREATININE 1.60* 2.03* 2.41*  CALCIUM 8.9 8.7* 8.8*   LFT  Recent Labs  11/10/14 1540 11/11/14 0223  PROT 7.1 6.4*  ALBUMIN 3.0* 2.8*  AST 1290* 2616*  ALT 697* 1251*  ALKPHOS 75 73  BILITOT 3.3* 3.5*  BILIDIR 1.0*  --   IBILI 2.3*  --    US Renal  11/10/2014   CLINICAL DATA:  Acute kidney injury  EXAM: RENAL / URINARY TRACT ULTRASOUND COMPLETE  COMPARISON:  None.  FINDINGS: Right Kidney:  Length: 11.7 cm. Echogenicity within normal limits. No mass or hydronephrosis visualized.  Left Kidney:  Length: 13.1 cm. Echogenicity within normal limits. No mass or hydronephrosis visualized.  Bladder:  Bladder is decompressed and therefore not evaluated.  IMPRESSION: Kidneys appear normal.  Bladder not evaluated.   Electronically Signed   By: Esperanza Heir M.D.   On: 11/10/2014 16:54   Nm  Pulmonary Perf And Vent  11/10/2014   CLINICAL DATA:  Shortness of breath, chest pain, hemoptysis, history hypertension, non ischemic cardiomyopathy, former smoker  EXAM: NUCLEAR MEDICINE VENTILATION - PERFUSION LUNG SCAN  TECHNIQUE: Ventilation images were obtained in multiple projections using inhaled aerosol Tc-52m DTPA. Perfusion images were obtained in multiple projections after intravenous injection of Tc-32m MAA.  RADIOPHARMACEUTICALS:  39.0 mCi Technetium-95m DTPA aerosol inhalation and 5.9 mCi Technetium-36m MAA IV  COMPARISON:  None  Correlation: Chest radiograph 11/09/2014  FINDINGS: Ventilation: Impaired ventilation identified in RIGHT middle and RIGHT lower lobes. RIGHT lower lobe absent perfusion corresponds to area of radiographic abnormality on preceding chest radiograph and recent CT. No abnormalities at RIGHT middle lobe on recent chest radiograph. Heart appears enlarged.  Perfusion: Matching diminished perfusion involving the RIGHT middle and RIGHT lower lobes. No additional areas of segmental or subsegmental perfusion decrease. Heart appears enlarged.  Chest radiograph: Significant RIGHT lower lobe infiltrate. No definite RIGHT middle lobe infiltrate.  IMPRESSION: Matching decreased ventilation, decreased perfusion, and radiographic abnormalities in RIGHT lower lobe.  Matching decreased subsegmental ventilation and perfusion abnormalities in RIGHT middle lobe with clear chest radiograph.  No perfusion abnormalities of the LEFT lung.  Findings represent an intermediate probability of pulmonary embolism.   Electronically Signed   By: Ulyses Southward M.D.   On: 11/10/2014 15:37   US Abdomen Limited Ruq  11/11/2014   CLINICAL DATA:  Elevated liver function tests.  EXAM: US ABDOMEN LIMITED - RIGHT UPPER QUADRANT  COMPARISON:  None.  FINDINGS: Gallbladder:  No gallstones are identified. The gallbladder is incompletely distended. The wall is thickened at 0.5 cm. No pericholecystic fluid. Sonographer  reports negative Murphy's sign.  Common bile duct:  Diameter: 0.4 cm.  Liver:  No focal lesion identified. The liver demonstrates mildly increased echogenicity. The portal vein is patent.  IMPRESSION: Gallbladder wall thickening without stones or pericholecystic fluid may be due to underdistention or hypoproteinemia. No ascites is identified.  Mildly increased echogenicity of the liver consistent with fatty infiltration.   Electronically Signed   By: Drusilla Kanner M.D.   On: 11/11/2014 11:11    ROS: No change in vision No CP + cough and SOB No abd pain No rash No sinus problems Chronic pain Rt ankle but no hx arthritis No neuropathic sxs  PHYSICAL EXAM: Blood pressure 117/81, pulse 98, temperature 97.9 F (36.6 C), temperature source Oral, resp. rate 23, height  (1.753 m), weight 88.9 kg (195 lb 15.8 oz), SpO2 97 %. HEENT: PERRLA EOMI NECK:No JVD LUNGS:Crackles Rt  mid/lower lung CARDIAC:RRR with 1/6 systolic M ABD:+ BS NTND No HSM EXT:tr-1+ edema NEURO:CNI, Ox3, No asterixis  Assessment: 1. Acute on Subacute/CKD.  With hx of hemoptysis certainly need to RO vasculitis.  Change in renal may also be related to his poor cardiac output 2. Severe cardiomyopathy  EF 5-10% 3. Lt Vent thrombus on coumadin 4. Hx ETOH abuse 5.   Elevated LFT's PLAN: 1. Await UA 2. Note ANA and ANCA ordered.  Will add antiGBM AB 3. Leave off ARB for now 4. Await hepatitis panel 5. Daily Scr 6. I would not start IV fluids   Sajid Ruppert T 11/11/2014, 1:19 PM

## 2014-11-11 NOTE — Progress Notes (Addendum)
ANTICOAGULATION NOTE  Pharmacy Consult for heparin  Indication: hx LV thrombus, r/o new PE  Allergies  Allergen Reactions  . Celexa [Citalopram Hydrobromide] Other (See Comments)    DISORIENTED  . Lisinopril Cough    Patient Measurements: Height: 5\' 9"  (175.3 cm) Weight: 195 lb 15.8 oz (88.9 kg) IBW/kg (Calculated) : 70.7 Heparin Dosing Weight: 87.4kg  Vital Signs: Temp: 98 F (36.7 C) (08/12 0840) Temp Source: Oral (08/12 0840) BP: 118/85 mmHg (08/12 0800) Pulse Rate: 92 (08/12 0840)  Labs:  Recent Labs  11/09/14 0820  11/09/14 2139  11/10/14 0341 11/10/14 0825 11/10/14 1540 11/10/14 1821 11/10/14 2057 11/11/14 0223  HGB 11.9*  --  12.2*  --   --   --  12.9*  --   --  12.1*  HCT 34.3*  --  34.8*  --   --   --  37.0*  --   --  34.2*  PLT 311  --  317  --   --   --  293  --   --  308  LABPROT 20.1*  --   --   --  27.7*  --   --   --   --  32.8*  INR 1.72*  --   --   --  2.62*  --   --   --   --  3.29*  HEPARINUNFRC  --   < > 0.30  --  0.20*  --   --  0.54  --  0.35  CREATININE 1.60*  --   --   --  2.03*  --   --   --   --  2.41*  TROPONINI  --   < >  --   < >  --  0.07* 0.06*  --  0.08*  --   < > = values in this interval not displayed.  Estimated Creatinine Clearance: 43.2 mL/min (by C-G formula based on Cr of 2.41).  Medications:  Infusions:  . heparin 1,900 Units/hr (11/11/14 0603)    Assessment: 43 yom presented to the ED with hemoptysis. He is chronically anticoagulated with warfarin for history of LV thrombus. However, INR was subtherapeutic upon admission so IV heparin was started. Also noted elevated d-dimer so possibility of new PE as well. INR jumped up to 2.62 today for unclear reasons. Hemoptysis has improved per pt report. Per discussion with MD, will continue IV heparin for now as treating possible new PE with unclear cause for INR increase. Likely INR will fall quickly with held doses of warfarin. Venous duplex 8/11 negative, V/Q scan 8/11  demonstrates probable PE. Heparin is therapeutic at 1900 units/hr. CBC stable.    Goal of Therapy:  Heparin level 0.3-0.7 units/ml Monitor platelets by anticoagulation protocol: Yes   Plan:  - Continue IV heparin at 1900 units/hr - Daily HL, CBC, INR - Monitor s/sx bleeding - F/u plan for PO anticoagulation     Agapito Games, PharmD, BCPS Clinical Pharmacist Pager: [phone] 11/11/2014 10:52 AM

## 2014-11-11 NOTE — Progress Notes (Signed)
Pharmacist Heart Failure Core Measure Documentation  Assessment: Robert Booth has an EF documented as 5-10% on 11/11/14.  Rationale: Heart failure patients with left ventricular systolic dysfunction (LVSD) and an EF < 40% should be prescribed an angiotensin converting enzyme inhibitor (ACEI) or angiotensin receptor blocker (ARB) at discharge unless a contraindication is documented in the medical record.  This patient is not currently on an ACEI or ARB for HF.  This note is being placed in the record in order to provide documentation that a contraindication to the use of these agents is present for this encounter.  ACE Inhibitor or Angiotensin Receptor Blocker is contraindicated (specify all that apply)  []   ACEI allergy AND ARB allergy []   Angioedema []   Moderate or severe aortic stenosis []   Hyperkalemia []   Hypotension []   Renal artery stenosis [x]   Worsening renal function, preexisting renal disease or dysfunction   Baldemar Friday 11/11/2014 2:41 PM

## 2014-11-11 NOTE — Consult Note (Signed)
Name: Robert Booth MRN: [id number] DOB: 01-Aug-1970    ADMISSION DATE:  11/09/2014 CONSULTATION DATE:  11/11/2014  REFERRING MD :  Danise Edge  CHIEF COMPLAINT:  hemoptysis   HISTORY OF PRESENT ILLNESS:  44 year old with dilated cardiomyopathy diagnosed in 05/2014, maintained on anticoagulation for mural LV thrombus. He was undergoing care at [facility] and now has transferred to [facility]. 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 He was admitted in 09/2014 for right-sided pleuritic chest pain and hemoptysis, chest x-ray showed right lower lobe infiltrate, CT chest showed patchy right lower lobe consolidation, interestingly disappears wedge-shaped on my review, and there was some focal hypoperfusion described on CT angio-and the question of distal pulmonary embolism was raised. He is treated with antibiotics, hemoptysis resolved chest pain improved for about 2 weeks and then recurred for the last 4 days. He has been therapeutic on Coumadin-on interim checks-but on admit his INR was 1.7. He reports frank hemoptysis-less than 5 mL per day. He has been maintained on heparin. Chest x-ray shows new nodular consolidation right perihilar and persistent infiltrate in the right lower lobe.  Unfortunately due to diuretics his renal function has worsened, lowest creatinine was 1.2 now up to 2.0   he is being treated with ceftaz and vancomycin for HCAP  SIGNIFICANT EVENTS  7/13 - admitted for hemoptysis-treated as CAP 8/10 adm for hemoptysis  STUDIES:  CT angio 7/ 13 >> Patchy right lower lobe consolidation most compatible with pneumonia and less likely representing infarct. Apparent non-opacification of the distal subsegmental pulmonary artery branch extending into the consolidated area may be artifactual or represent focal hypoperfusion ? Distal PE  SUBJECTIVE: continues to have hemoptysis  VITAL SIGNS: Temp:  [97.3 F (36.3 C)-98 F (36.7 C)] 98 F (36.7 C) (08/12  0840) Pulse Rate:  [52-109] 92 (08/12 0840) Resp:  [0-30] 15 (08/12 0840) BP: (102-118)/(79-87) 118/85 mmHg (08/12 0800) SpO2:  [84 %-100 %] 98 % (08/12 0840) Weight:  [88.9 kg (195 lb 15.8 oz)] 88.9 kg (195 lb 15.8 oz) (08/12 0427)  PHYSICAL EXAMINATION: Gen. Pleasant, well-nourished, in no distress, normal affect ENT - no lesions, no post nasal drip Neck: No JVD, no thyromegaly, no carotid bruits Lungs: no use of accessory muscles, no dullness to percussion, RLLrales , no rhonchi  Cardiovascular: Rhythm regular, heart sounds  normal, no murmurs, no peripheral edema Abdomen: soft and non-tender, no hepatosplenomegaly, BS normal. Musculoskeletal: No deformities, no cyanosis or clubbing Neuro:  alert, non focal Skin:  Warm, no lesions/ rash  Recent Labs Lab 11/09/14 0820 11/10/14 0341 11/11/14 0223  NA 135 133* 132*  K 4.1 5.2* 4.5  CL 99* 97* 95*  CO2 21* 18* 25  BUN 22* 33* 43*  CREATININE 1.60* 2.03* 2.41*  GLUCOSE 94 91 95   Recent Labs Lab 11/09/14 2139 11/10/14 1540 11/11/14 0223  HGB 12.2* 12.9* 12.1*  HCT 34.8* 37.0* 34.2*  WBC 17.5* 18.3* 15.8*  PLT 317 293 308   US Renal  11/10/2014   CLINICAL DATA:  Acute kidney injury  EXAM: RENAL / URINARY TRACT ULTRASOUND COMPLETE  COMPARISON:  None.  FINDINGS: Right Kidney:  Length: 11.7 cm. Echogenicity within normal limits. No mass or hydronephrosis visualized.  Left Kidney:  Length: 13.1 cm. Echogenicity within normal limits. No mass or hydronephrosis visualized.  Bladder:  Bladder is decompressed and therefore not evaluated.  IMPRESSION: Kidneys appear normal.  Bladder not evaluated.   Electronically Signed   By: Edgar Frisk.D.  On: 11/10/2014 16:54   Nm Pulmonary Perf And Vent  11/10/2014   CLINICAL DATA:  Shortness of breath, chest pain, hemoptysis, history hypertension, non ischemic cardiomyopathy, former smoker  EXAM: NUCLEAR MEDICINE VENTILATION - PERFUSION LUNG SCAN  TECHNIQUE: Ventilation images were  obtained in multiple projections using inhaled aerosol Tc-60m DTPA. Perfusion images were obtained in multiple projections after intravenous injection of Tc-52m MAA.  RADIOPHARMACEUTICALS:  39.0 mCi Technetium-10m DTPA aerosol inhalation and 5.9 mCi Technetium-28m MAA IV  COMPARISON:  None  Correlation: Chest radiograph 11/09/2014  FINDINGS: Ventilation: Impaired ventilation identified in RIGHT middle and RIGHT lower lobes. RIGHT lower lobe absent perfusion corresponds to area of radiographic abnormality on preceding chest radiograph and recent CT. No abnormalities at RIGHT middle lobe on recent chest radiograph. Heart appears enlarged.  Perfusion: Matching diminished perfusion involving the RIGHT middle and RIGHT lower lobes. No additional areas of segmental or subsegmental perfusion decrease. Heart appears enlarged.  Chest radiograph: Significant RIGHT lower lobe infiltrate. No definite RIGHT middle lobe infiltrate.  IMPRESSION: Matching decreased ventilation, decreased perfusion, and radiographic abnormalities in RIGHT lower lobe.  Matching decreased subsegmental ventilation and perfusion abnormalities in RIGHT middle lobe with clear chest radiograph.  No perfusion abnormalities of the LEFT lung.  Findings represent an intermediate probability of pulmonary embolism.   Electronically Signed   By: Ulyses Southward M.D.   On: 11/10/2014 15:37   US Abdomen Limited Ruq  11/11/2014   CLINICAL DATA:  Elevated liver function tests.  EXAM: US ABDOMEN LIMITED - RIGHT UPPER QUADRANT  COMPARISON:  None.  FINDINGS: Gallbladder:  No gallstones are identified. The gallbladder is incompletely distended. The wall is thickened at 0.5 cm. No pericholecystic fluid. Sonographer reports negative Murphy's sign.  Common bile duct:  Diameter: 0.4 cm.  Liver:  No focal lesion identified. The liver demonstrates mildly increased echogenicity. The portal vein is patent.  IMPRESSION: Gallbladder wall thickening without stones or pericholecystic  fluid may be due to underdistention or hypoproteinemia. No ascites is identified.  Mildly increased echogenicity of the liver consistent with fatty infiltration.   Electronically Signed   By: Drusilla Kanner M.D.   On: 11/11/2014 11:11   I reviewed chest CT and V/Q scan findings myself, matching defects on the right and findings on the CT of the chest noted with infiltrate.  Discussed with PCCM-NP and radiology.  ASSESSMENT / PLAN:   Hemoptysis - RLL CAP -treated 09/2014, now with new infiltrate. Both these infiltrates appear wedge-shaped-could be related to aspirated blood. CT angiogram 7/16 is very suspicious for pulmonary infarct from distal pulmonary embolism. He does need anticoagulation for LV thrombus, hence heparin cannot be stopped-unless hemoptysis worsens. Although he does have some pleuritic pain and mild leukocytosis, I really doubt that this is infectious  Recommend- Venous duplex negative, no need for filter. VQ scan is intermediate probability, continue anticoagulation given left mural thrombus clot. Procalcitonin 8.33>8.41 making PNA most likely diagnosis of the present pulmonary infiltrate. Vanc/Ceftaz as ordered. Continue heparin for now unless hemoptysis worsens and if more stable may proceed with coumadin. Doubt need for bronchoscopy here- his pleuritic pain and imaging does localize to the right lower lobe. No mass has been noted on prior imaging, hence repeat CT imaging not require  Alyson Reedy, M.D. [facility]. Pager: [phone]. After hours pager: [phone].  11/11/2014, 11:32 AM

## 2014-11-11 NOTE — Progress Notes (Signed)
Family Medicine Teaching Service Daily Progress Note Intern Pager: [phone]  Patient name: Robert Booth Medical record number: [id number] Date of birth: [birth year] Age: 44 y.o. Gender: male  Primary Care Provider: Lora Paula, MD Consultants: Cardiology, Pulmonology Code Status: FULL  Pt Overview and Major Events to Date:  - Hospitalized 7/13 for hemoptysis treated as HCAP.  Assessment and Plan: Robert Booth is a 44 y.o. male presenting with cough, SOB, hemoptysis and pleuritic chest pain. PMH is significant for HTN, dilated cardiomyopathy (NICM, reduced EF) alcoholism, and anxiety. Suspect pulmonary embolism given tachycardia, SOB, subtherapeutic INR on admission on coumadin, known LV thrombus, and positive D-dimer. With AKI, also considering Goodpasture's/Wegener's. Concern for acute on chronic CHF exacerbation. Now with hepatic failure.   # Hemoptysis: Per patient, improving. V/Q scan intermediate risk.  - Order ANCA, ANA, c-ANCA  - Hemoglobin stable. Trend CBCs. - ECHO to assess for right heart strain - Pharmacy consulting for anticoag management. Continue heparin gtt with goal level of 0.3-0.7.  - Holding coumadin.  - Pulmonology consulted, appreciate recommendations. Low suspicion for mass. No need for bronchoscopy at this time.  # Hepatic Failure: MELD score of 32, AST 1290, ALT 697, INR increasing. LE dopplers showed pulsatile venous flow suggestive of right-sided heart pressure, concerning for hepatic congestion. - Hepatitis panel pending.  - RUQ U/S ordered.   # Chest Pain: Improved right-sided chest pain. Troponins trended and peaked at 0.09. Appears musculoskeletal in nature, as reproducible with palpation.  - Repeat EKG in the a.m. unchanged from prior.   # Acute on Chronis Systolic CHF: LVEF 20% on ECHO 10/13/14. BNP elevated to 1689.6.Was 1091.1 at last admission. Crackles on exam today, and continues to have SOB when laying down. LE stable.  - Give 40 mg lasix  as needed.  - Obtain daily weights and strict I/Os - Cardiology consulted. Appreciate recommendations. Start spironolactone on discharge per recs.  - Heart Failure team consulted.   # Lower Extremity Swelling: R > L - Ordered LE venous dopplers.  # AKI: 2.41 < 2.03 < 1.6 (1.22 in July) Urine output low with only 100 recorded 11/10/14.  - Ordered renal U/S - Continue holding losartan, lasix  - Consider gentle fluid resuscitation if worsens.  - UA ordered. - UDS ordered.  - Nephrology consulted.   # Increased INR: 3.29 < 2.62 < 1.72. Elevated LFTs.  - Continue heparin gtt given high suspicion for PE and known left ventricular thrombus.  - Heparin level within target range of 0.3-0.7 at 0.35. - Continue, unless hemoptysis worsens.   # Nausea: Patient could not tolerate promethazine.  - Metoclopramide ordered q6h prn. Limit use given QTc>500.  # HCAP: Patient afebrile. CXR with new nodular consolidation. WBC decreasing: 15.8 < 18.3. Procalcitionin 8.33. Clinical picture more consistent with CHF exacerbation rather than infection.  - Continue IV vanc and ceftaz per pharmacy.  - Following procalcitonin. As decreases, switch to PO.   #HTN: Continues to be normotensive - Continue home metoprolol.   #SIRS: Still tachypneic and tachycardic. - Blood cx neg x 1 day. - Procalcitonin 8.33  # H/o Anxiety: not currently medically treated  # Alcohol Abuse: Denies ETOH use - CIWA protocol.   FEN/GI: heart healthy diet  PPx: protonix 40 mg daily  Disposition: Admitted to the [facility] on telemetry.   Subjective:  Robert Booth says he had a lot of trouble with SOB with imaging tests yesterday. However, he slept well overnight, sitting up. He says the amount  of blood he has been coughing up has decreased. His cough is more frequent this morning, so he is worried hemoptysis will worsen. He had nausea last night and said the phenergan was too strong. He has not tried reglan  yet today. He reports that he has pain in his chest only when he moves but that it is improved from yesterday.   Objective: Temp:  [97.3 F (36.3 C)-97.9 F (36.6 C)] 97.3 F (36.3 C) (08/12 0400) Pulse Rate:  [89-112] 99 (08/12 0400) Resp:  [0-30] 0 (08/12 0400) BP: (102-117)/(79-93) 105/87 mmHg (08/12 0400) SpO2:  [84 %-100 %] 99 % (08/12 0400) Weight:  [195 lb 15.8 oz (88.9 kg)] 195 lb 15.8 oz (88.9 kg) (08/12 0427) Physical Exam: General: Awake, sitting up in bed. Nasal canula in place.  Cardiovascular: Tachycardic. Third heart sound appreciated over right upper sternal border. Brisk cap refill.  Chest: No longer tender to palpation of right chest wall.  Respiratory: Crackles appreciated throughout all lung fields.  Abdomen: Soft, non-tender, nondistended, positive BS.  Extremities: Stable LE edema from yesterday. 3+ pitting edema of medial malleolus of RLE, 1+ to just above ankle. Trace edema of LLE. DP pulse 1+ on right, 2+ on left.  Neuro: AOx3. Follows commands. Asks appropriate questions.  Skin: WWP.   Laboratory:  Recent Labs Lab 11/09/14 2139 11/10/14 1540 11/11/14 0223  WBC 17.5* 18.3* 15.8*  HGB 12.2* 12.9* 12.1*  HCT 34.8* 37.0* 34.2*  PLT 317 293 308    Recent Labs Lab 11/09/14 0820 11/10/14 0341 11/10/14 1540 11/11/14 0223  NA 135 133*  --  132*  K 4.1 5.2*  --  4.5  CL 99* 97*  --  95*  CO2 21* 18*  --  25  BUN 22* 33*  --  43*  CREATININE 1.60* 2.03*  --  2.41*  CALCIUM 8.9 8.7*  --  8.8*  PROT  --   --  7.1 6.4*  BILITOT  --   --  3.3* 3.5*  ALKPHOS  --   --  75 73  ALT  --   --  697* 1251*  AST  --   --  1290* 2616*  GLUCOSE 94 91  --  95    Imaging/Diagnostic Tests:  Dg Chest 2 View  11/09/2014   CLINICAL DATA:  Recent pneumonia  EXAM: CHEST  2 VIEW  COMPARISON:  10/13/2014  FINDINGS: Borderline cardiomegaly. There is new nodular consolidation right perihilar region. Persistent infiltrate/ consolidation right lower lobe. Findings  suspicious for recurrent multifocal pneumonia. No pulmonary edema. Follow-up to resolution after appropriate treatment recommended.  IMPRESSION: There is new nodular consolidation right perihilar region. Persistent infiltrate/ consolidation right lower lobe. Findings suspicious for recurrent multifocal pneumonia. No pulmonary edema. Follow-up to resolution after appropriate treatment recommended.   Electronically Signed   By: Natasha Mead M.D.   On: 11/09/2014 08:08   X-ray Chest Pa And Lateral  10/13/2014   CLINICAL DATA:  Hemoptysis, community-acquired pneumonia, CHF, previous tobacco use.  EXAM: CHEST  2 VIEW  COMPARISON:  Chest x-ray and chest CT scan of October 12, 2014  FINDINGS: There is increased density in the right lower lobe with further obscuration of the hemidiaphragm. An air bronchogram is visible on the lateral film. The right middle and upper lobes are clear as is the left lung. The cardiac silhouette is mildly enlarged. The central pulmonary vascularity is prominent though stable. The bony thorax is unremarkable.  IMPRESSION: Increased opacity in the right lower  lobe consistent with pneumonia. Mild cardiomegaly without pulmonary edema.   Electronically Signed   By: David  [place] M.D.   On: 10/13/2014 07:22   Dg Chest 2 View  10/12/2014   CLINICAL DATA:  44 year old male with 1 week history of productive cough and 1 day history of hemoptysis accompanied by right flank and upper back pain.  EXAM: CHEST  2 VIEW  COMPARISON:  None.  FINDINGS: Focal patchy airspace opacity in the anterior aspect of the right lower lobe concerning for bronchopneumonia. Otherwise, the lungs are clear. The cardiac and mediastinal contours are within normal limits. Osseous structures are intact and unremarkable.  IMPRESSION: Imaging findings are most consistent with right lower lobe pneumonia.   Electronically Signed   By: Malachy Moan M.D.   On: 10/12/2014 15:55   Ct Angio Chest Pe W/cm &/or Wo Cm  10/12/2014    CLINICAL DATA:  44 year old male with right-sided chest pain and shortness of breath  EXAM: CT ANGIOGRAPHY CHEST WITH CONTRAST  TECHNIQUE: Multidetector CT imaging of the chest was performed using the standard protocol during bolus administration of intravenous contrast. Multiplanar CT image reconstructions and MIPs were obtained to evaluate the vascular anatomy.  CONTRAST:  OMNIPAQUE IOHEXOL 350 MG/ML SOLN  COMPARISON:  Chest radiograph dated 10/12/2014  FINDINGS: There is a patchy area ground-glass and nodular opacities in the right lower lobe. There is non opacification of a distal subsegmental branch of the pulmonary artery extending to this consolidated portion of the right lower lobe. This may be artifactual or represent hypoperfusion secondary to hypoventilation. Distal branch pulmonary artery embolus is not excluded. Correlation with clinical exam and signs and symptoms for pneumonia recommended. No other pulmonary embolus identified. There is a small right pleural effusion. Small patchy areas of low-attenuation in the left upper and left lower lobe most compatible with air trapping. The central airways are patent.  The visualized thoracic aorta appear unremarkable. Mild cardiomegaly. No pericardial effusion. No hilar or mediastinal lymphadenopathy. The upper mediastinum appears unremarkable. The chest wall and the visualized upper abdomen is unremarkable. No acute fracture.  Review of the MIP images confirms the above findings.  IMPRESSION: Patchy right lower lobe consolidation most compatible with pneumonia and less likely representing infarct. Apparent non-opacification of the distal subsegmental pulmonary artery branch extending into the consolidated area may be artifactual or represent focal hypoperfusion. A distal branch pulmonary embolism is less likely, given no other pulmonary embolus identified, but not entirely excluded. Correlation with clinical exam and sign and symptoms of pneumonia  recommended.   Electronically Signed   By: Elgie Collard M.D.   On: 10/12/2014 18:27   US Renal  11/10/2014   CLINICAL DATA:  Acute kidney injury  EXAM: RENAL / URINARY TRACT ULTRASOUND COMPLETE  COMPARISON:  None.  FINDINGS: Right Kidney:  Length: 11.7 cm. Echogenicity within normal limits. No mass or hydronephrosis visualized.  Left Kidney:  Length: 13.1 cm. Echogenicity within normal limits. No mass or hydronephrosis visualized.  Bladder:  Bladder is decompressed and therefore not evaluated.  IMPRESSION: Kidneys appear normal.  Bladder not evaluated.   Electronically Signed   By: Esperanza Heir M.D.   On: 11/10/2014 16:54   Nm Pulmonary Perf And Vent  11/10/2014   CLINICAL DATA:  Shortness of breath, chest pain, hemoptysis, history hypertension, non ischemic cardiomyopathy, former smoker  EXAM: NUCLEAR MEDICINE VENTILATION - PERFUSION LUNG SCAN  TECHNIQUE: Ventilation images were obtained in multiple projections using inhaled aerosol Tc-58m DTPA. Perfusion images were obtained  in multiple projections after intravenous injection of Tc-59m MAA.  RADIOPHARMACEUTICALS:  39.0 mCi Technetium-40m DTPA aerosol inhalation and 5.9 mCi Technetium-14m MAA IV  COMPARISON:  None  Correlation: Chest radiograph 11/09/2014  FINDINGS: Ventilation: Impaired ventilation identified in RIGHT middle and RIGHT lower lobes. RIGHT lower lobe absent perfusion corresponds to area of radiographic abnormality on preceding chest radiograph and recent CT. No abnormalities at RIGHT middle lobe on recent chest radiograph. Heart appears enlarged.  Perfusion: Matching diminished perfusion involving the RIGHT middle and RIGHT lower lobes. No additional areas of segmental or subsegmental perfusion decrease. Heart appears enlarged.  Chest radiograph: Significant RIGHT lower lobe infiltrate. No definite RIGHT middle lobe infiltrate.  IMPRESSION: Matching decreased ventilation, decreased perfusion, and radiographic abnormalities in RIGHT lower  lobe.  Matching decreased subsegmental ventilation and perfusion abnormalities in RIGHT middle lobe with clear chest radiograph.  No perfusion abnormalities of the LEFT lung.  Findings represent an intermediate probability of pulmonary embolism.   Electronically Signed   By: Ulyses Southward M.D.   On: 11/10/2014 15:37    Lovena Kluck Percell Boston, MD 11/11/2014, 7:32 AM PGY-1, [facility] Family Medicine FPTS Intern pager: [phone], text pages welcome

## 2014-11-11 NOTE — Progress Notes (Signed)
Report was given and waiting for bed availablity

## 2014-11-11 NOTE — Progress Notes (Addendum)
  Patient pale and diaphoretic with ongoing severe cough. Very prominent s3.  Central line placed. Co-ox 27% c/w profound cardiogenic shock.  Will start milrinone at 0.375. Repeat co-ox in 1 hour. May need norepi added +/- IABP. CVP 24. Will begin diuresis as well.   Discussed with patient and his family.   Addition 40 minutes CCT not including line placement.   Breeana Sawtelle,MD 8:47 PM

## 2014-11-11 NOTE — Procedures (Signed)
Central Venous Catheter Insertion Procedure Note Quashaun Klimczak [id number] [birth year]  Procedure: Insertion of Central Venous Catheter Indications: Drug and/or fluid administration  Procedure Details Consent: Risks of procedure as well as the alternatives and risks of each were explained to the (patient/caregiver).  Consent for procedure obtained. Time Out: Verified patient identification, verified procedure, site/side was marked, verified correct patient position, special equipment/implants available, medications/allergies/relevent history reviewed, required imaging and test results available.  Performed  Maximum sterile technique was used including antiseptics, cap, gloves, gown, hand hygiene, mask and sheet. Skin prep: Chlorhexidine; local anesthetic administered A antimicrobial bonded/coated triple lumen catheter was placed in the right internal jugular vein using the Seldinger technique.  Evaluation Blood flow good Complications: No apparent complications Patient did tolerate procedure well. Chest X-ray ordered to verify placement.  CXR: normal.  Arvilla Meres MD 11/11/2014, 8:32 PM

## 2014-11-11 NOTE — Progress Notes (Signed)
Report given to Tvedt,RN on 2H. Pt transported to rm 15 by 2H RNs.

## 2014-11-11 NOTE — Progress Notes (Signed)
Advanced Heart Failure Rounding Note (INITIAL CONSULT)  Primary Cardiologist:   Reason for referral: A/C systolic HF EF 5-10%  Subjective:    Robert Booth is a 44 y.o. male with hx of systolic HF with NICM secondary to prolonged ETOH abuse, EF 5-10% by Echo 11/11/14, and CKD stage 2.  He presented to [facility] with hemoptsysis on 11/09/14 after a recent hospitalization for the same approx 2 weeks ago. He had increasing DOE and cough with frank hemoptysis since his discharge.    He states he initially starting having trouble with his heart in March of this year. He has SOB with minimal activity, but his primary symptom is lack of energy.  He cannot keep up with his 83 yo mother around the house or grocery store. He also has had increased peripheral edema. Previously, he was drinking at least 12 beers and a fifth of liquor every day.   He has not had any CP related to activity but has had pleuritic CP with these two admissions.    Echo 10/13/14 EF 20% Echo 11/11/14 EF 5-10%   Objective:   Weight Range: 195 lb 15.8 oz (88.9 kg) Body mass index is 28.93 kg/(m^2).   Vital Signs:   Temp:  [97.3 F (36.3 C)-98 F (36.7 C)] 97.9 F (36.6 C) (08/12 1241) Pulse Rate:  [52-109] 98 (08/12 1241) Resp:  [0-37] 23 (08/12 1241) BP: (102-118)/(79-87) 117/81 mmHg (08/12 1241) SpO2:  [84 %-100 %] 97 % (08/12 1241) Weight:  [195 lb 15.8 oz (88.9 kg)] 195 lb 15.8 oz (88.9 kg) (08/12 0427) Last BM Date: 11/10/14  Weight change: Filed Weights   11/09/14 2048 11/10/14 0500 11/11/14 0427  Weight: 192 lb 10.9 oz (87.4 kg) 190 lb 11.2 oz (86.5 kg) 195 lb 15.8 oz (88.9 kg)    Intake/Output:   Intake/Output Summary (Last 24 hours) at 11/11/14 1503 Last data filed at 11/11/14 1300  Gross per 24 hour  Intake    997 ml  Output    700 ml  Net    297 ml     Physical Exam: General: Pale. Somewhat diphoretic Neuro: Alert and oriented X 3. Moves all extremities spontaneously. Psych: Normal affect. HEENT:  Normal Neck: Supple without bruits. JVP 10-11 with prominent CV waves Lungs: Resp regular and unlabored, CTA. Heart: PMI laterally displaced RRR s3, s4, or murmurs. +S3 Abdomen: Soft, non-tender, non-distended, BS + x 4.  Extremities: No clubbing, cyanosis. DP/PT/Radials 2+ and equal bilaterally. 1-2+ peripheral edema  Telemetry: Sinus - Sinus Tach 100s. Occasional PVCs.  Several 3-4 beat runs of NSVT.  Labs: CBC  Recent Labs  11/09/14 0820  11/10/14 1540 11/11/14 0223  WBC 11.1*  < > 18.3* 15.8*  NEUTROABS 8.9*  --   --   --   HGB 11.9*  < > 12.9* 12.1*  HCT 34.3*  < > 37.0* 34.2*  MCV 86.0  < > 85.8 83.6  PLT 311  < > 293 308  < > = values in this interval not displayed. Basic Metabolic Panel  Recent Labs  11/10/14 0341 11/11/14 0223  NA 133* 132*  K 5.2* 4.5  CL 97* 95*  CO2 18* 25  GLUCOSE 91 95  BUN 33* 43*  CALCIUM 8.7* 8.8*   Liver Function Tests  Recent Labs  11/10/14 1540 11/11/14 0223  AST 1290* 2616*  ALT 697* 1251*  ALKPHOS 75 73  BILITOT 3.3* 3.5*  PROT 7.1 6.4*  ALBUMIN 3.0* 2.8*   No results for input(s):  LIPASE, AMYLASE in the last 72 hours. Cardiac Enzymes  Recent Labs  11/10/14 0825 11/10/14 1540 11/10/14 2057  TROPONINI 0.07* 0.06* 0.08*    BNP: BNP (last 3 results)  Recent Labs  10/12/14 1710 10/17/14 0438 11/09/14 0820  BNP 879.0* 1091.1* 1689.6*    ProBNP (last 3 results) No results for input(s): PROBNP in the last 8760 hours.   D-Dimer  Recent Labs  11/09/14 1148  DDIMER 1.82*   Hemoglobin A1C No results for input(s): HGBA1C in the last 72 hours. Fasting Lipid Panel No results for input(s): CHOL, HDL, LDLCALC, TRIG, CHOLHDL, LDLDIRECT in the last 72 hours. Thyroid Function Tests No results for input(s): TSH, T4TOTAL, T3FREE, THYROIDAB in the last 72 hours.  Invalid input(s): FREET3  Other results:     Imaging/Studies:  US Renal  11/10/2014   CLINICAL DATA:  Acute kidney injury  EXAM:  RENAL / URINARY TRACT ULTRASOUND COMPLETE  COMPARISON:  None.  FINDINGS: Right Kidney:  Length: 11.7 cm. Echogenicity within normal limits. No mass or hydronephrosis visualized.  Left Kidney:  Length: 13.1 cm. Echogenicity within normal limits. No mass or hydronephrosis visualized.  Bladder:  Bladder is decompressed and therefore not evaluated.  IMPRESSION: Kidneys appear normal.  Bladder not evaluated.   Electronically Signed   By: Esperanza Heir M.D.   On: 11/10/2014 16:54   Nm Pulmonary Perf And Vent  11/10/2014   CLINICAL DATA:  Shortness of breath, chest pain, hemoptysis, history hypertension, non ischemic cardiomyopathy, former smoker  EXAM: NUCLEAR MEDICINE VENTILATION - PERFUSION LUNG SCAN  TECHNIQUE: Ventilation images were obtained in multiple projections using inhaled aerosol Tc-37m DTPA. Perfusion images were obtained in multiple projections after intravenous injection of Tc-27m MAA.  RADIOPHARMACEUTICALS:  39.0 mCi Technetium-18m DTPA aerosol inhalation and 5.9 mCi Technetium-58m MAA IV  COMPARISON:  None  Correlation: Chest radiograph 11/09/2014  FINDINGS: Ventilation: Impaired ventilation identified in RIGHT middle and RIGHT lower lobes. RIGHT lower lobe absent perfusion corresponds to area of radiographic abnormality on preceding chest radiograph and recent CT. No abnormalities at RIGHT middle lobe on recent chest radiograph. Heart appears enlarged.  Perfusion: Matching diminished perfusion involving the RIGHT middle and RIGHT lower lobes. No additional areas of segmental or subsegmental perfusion decrease. Heart appears enlarged.  Chest radiograph: Significant RIGHT lower lobe infiltrate. No definite RIGHT middle lobe infiltrate.  IMPRESSION: Matching decreased ventilation, decreased perfusion, and radiographic abnormalities in RIGHT lower lobe.  Matching decreased subsegmental ventilation and perfusion abnormalities in RIGHT middle lobe with clear chest radiograph.  No perfusion abnormalities  of the LEFT lung.  Findings represent an intermediate probability of pulmonary embolism.   Electronically Signed   By: Ulyses Southward M.D.   On: 11/10/2014 15:37   US Abdomen Limited Ruq  11/11/2014   CLINICAL DATA:  Elevated liver function tests.  EXAM: US ABDOMEN LIMITED - RIGHT UPPER QUADRANT  COMPARISON:  None.  FINDINGS: Gallbladder:  No gallstones are identified. The gallbladder is incompletely distended. The wall is thickened at 0.5 cm. No pericholecystic fluid. Sonographer reports negative Murphy's sign.  Common bile duct:  Diameter: 0.4 cm.  Liver:  No focal lesion identified. The liver demonstrates mildly increased echogenicity. The portal vein is patent.  IMPRESSION: Gallbladder wall thickening without stones or pericholecystic fluid may be due to underdistention or hypoproteinemia. No ascites is identified.  Mildly increased echogenicity of the liver consistent with fatty infiltration.   Electronically Signed   By: Drusilla Kanner M.D.   On: 11/11/2014 11:11  Latest Echo  Latest Cath   Medications:     Scheduled Medications: . cefTAZidime (FORTAZ)  IV  2 g Intravenous Q12H  . Chlorhexidine Gluconate Cloth  6 each Topical Q0600  . folic acid  1 mg Oral Daily  . metoprolol succinate  100 mg Oral Daily  . multivitamin with minerals  1 tablet Oral Daily  . mupirocin ointment  1 application Nasal BID  . pantoprazole  40 mg Oral Daily  . sodium chloride  3 mL Intravenous Q12H  . thiamine  100 mg Oral Daily   Or  . thiamine  100 mg Intravenous Daily  . vancomycin  750 mg Intravenous Q12H     Infusions: . heparin 1,900 Units/hr (11/11/14 0603)     PRN Medications:  benzonatate, LORazepam **OR** LORazepam, metoCLOPramide (REGLAN) injection, morphine injection, polyethylene glycol, technetium TC 83M diethylenetriame-pentaacetic acid, traMADol   Assessment/Plan   1. Acute on chronic systolic CHF with NICM, -> cardiogenic shock 2. AKI on CKD stage 2 3. Elevated  troponin 4. ETOH abuse 5. Acute renal failure 6. Shock liver 7. Acute respiratory failure 8. PNA with hemoptysis 9. NSVT   Will have placed CVL for coox, CVP monitoring, and potential inotropic support with findings concerning for low output HF. Will transfer to 2H stepdown.  Pts sustained ETOH use likely major contributing factor to cardiomyopathy   Length of Stay: 2  Robert Freer PA-C 11/11/2014, 3:03 PM  Advanced Heart Failure Team Pager [phone] (M-F; 7a - 4p)  Please contact [facility] for night-coverage after hours (4p -7a ) and weekends on [email or web address]  Patient seen and examined with Otilio Saber, PA-C. We discussed all aspects of the encounter. I agree with the assessment and plan as stated above.   Echo and CT images reviewed personally. He is 44 y/o male with recently diagnosed severe ETOH cardiomyopathy with EF 10% and severe RV dysfunction. Admitted with PNA and hemoptysis. Now with progressive renal and liver failure. He is in cardiogenic shock with low output. Will transfer to 2H and place central line. Will need inotrope support. Discussed with him and his mother.   The patient is critically ill with multiple organ systems failure and requires high complexity decision making for assessment and support, frequent evaluation and titration of therapies, application of advanced monitoring technologies and extensive interpretation of multiple databases.   Critical Care Time devoted to patient care services described in this note is 45 Minutes.  Booth, Daniel,MD 3:25 PM

## 2014-11-11 NOTE — Progress Notes (Signed)
Echocardiogram 2D Echocardiogram has been performed.  Robert Booth 11/11/2014, 12:58 PM

## 2014-11-12 ENCOUNTER — Inpatient Hospital Stay (HOSPITAL_COMMUNITY): Payer: Medicaid Other

## 2014-11-12 LAB — COMPREHENSIVE METABOLIC PANEL
ALT: 1213 U/L — AB (ref 17–63)
AST: 1751 U/L — AB (ref 15–41)
Albumin: 2.6 g/dL — ABNORMAL LOW (ref 3.5–5.0)
Alkaline Phosphatase: 71 U/L (ref 38–126)
Anion gap: 10 (ref 5–15)
BUN: 40 mg/dL — ABNORMAL HIGH (ref 6–20)
CALCIUM: 8.1 mg/dL — AB (ref 8.9–10.3)
CHLORIDE: 94 mmol/L — AB (ref 101–111)
CO2: 27 mmol/L (ref 22–32)
CREATININE: 2.17 mg/dL — AB (ref 0.61–1.24)
GFR calc Af Amer: 41 mL/min — ABNORMAL LOW (ref >60)
GFR, EST NON AFRICAN AMERICAN: 35 mL/min — AB (ref >60)
Glucose, Bld: 107 mg/dL — ABNORMAL HIGH (ref 65–99)
Potassium: 3 mmol/L — ABNORMAL LOW (ref 3.5–5.1)
Sodium: 131 mmol/L — ABNORMAL LOW (ref 135–145)
Total Bilirubin: 5.3 mg/dL — ABNORMAL HIGH (ref 0.3–1.2)
Total Protein: 6.2 g/dL — ABNORMAL LOW (ref 6.5–8.1)

## 2014-11-12 LAB — CARBOXYHEMOGLOBIN
CARBOXYHEMOGLOBIN: 1.6 % — AB (ref 0.5–1.5)
Carboxyhemoglobin: 1.5 % (ref 0.5–1.5)
Methemoglobin: 1.1 % (ref 0.0–1.5)
Methemoglobin: 1.3 % (ref 0.0–1.5)
O2 Saturation: 67.2 %
O2 Saturation: 72.7 %
Total hemoglobin: 10.7 g/dL — ABNORMAL LOW (ref 13.5–18.0)
Total hemoglobin: 12.5 g/dL — ABNORMAL LOW (ref 13.5–18.0)

## 2014-11-12 LAB — MAGNESIUM: Magnesium: 1.8 mg/dL (ref 1.7–2.4)

## 2014-11-12 LAB — CBC
HCT: 30.8 % — ABNORMAL LOW (ref 39.0–52.0)
HEMOGLOBIN: 10.9 g/dL — AB (ref 13.0–17.0)
MCH: 29.1 pg (ref 26.0–34.0)
MCHC: 35.4 g/dL (ref 30.0–36.0)
MCV: 82.4 fL (ref 78.0–100.0)
Platelets: 307 10*3/uL (ref 150–400)
RBC: 3.74 MIL/uL — ABNORMAL LOW (ref 4.22–5.81)
RDW: 15 % (ref 11.5–15.5)
WBC: 14.7 10*3/uL — ABNORMAL HIGH (ref 4.0–10.5)

## 2014-11-12 LAB — PHOSPHORUS: PHOSPHORUS: 2.9 mg/dL (ref 2.5–4.6)

## 2014-11-12 LAB — PROCALCITONIN: Procalcitonin: 8.07 ng/mL

## 2014-11-12 LAB — HIV ANTIBODY (ROUTINE TESTING W REFLEX): HIV SCREEN 4TH GENERATION: NONREACTIVE

## 2014-11-12 LAB — HEPATITIS PANEL, ACUTE
HCV Ab: <0.1 s/co ratio (ref 0.0–0.9)
Hep A IgM: NEGATIVE
Hep B C IgM: NEGATIVE
Hepatitis B Surface Ag: NEGATIVE

## 2014-11-12 LAB — HEPARIN LEVEL (UNFRACTIONATED): HEPARIN UNFRACTIONATED: 0.54 [IU]/mL (ref 0.30–0.70)

## 2014-11-12 LAB — PROTIME-INR
INR: 3.95 — ABNORMAL HIGH (ref 0.00–1.49)
PROTHROMBIN TIME: 37.7 s — AB (ref 11.6–15.2)

## 2014-11-12 MED ORDER — POTASSIUM CHLORIDE CRYS ER 20 MEQ PO TBCR
40.0000 meq | EXTENDED_RELEASE_TABLET | Freq: Two times a day (BID) | ORAL | Status: DC
  Administered 2014-11-12: 40 meq via ORAL
  Filled 2014-11-12 (×3): qty 2

## 2014-11-12 MED ORDER — CETYLPYRIDINIUM CHLORIDE 0.05 % MT LIQD
7.0000 mL | Freq: Two times a day (BID) | OROMUCOSAL | Status: DC
  Administered 2014-11-12 – 2014-11-16 (×8): 7 mL via OROMUCOSAL

## 2014-11-12 MED ORDER — POTASSIUM CHLORIDE CRYS ER 20 MEQ PO TBCR
40.0000 meq | EXTENDED_RELEASE_TABLET | ORAL | Status: AC
  Administered 2014-11-12 (×2): 40 meq via ORAL
  Filled 2014-11-12 (×2): qty 2

## 2014-11-12 MED ORDER — NOREPINEPHRINE BITARTRATE 1 MG/ML IV SOLN
5.0000 ug/min | INTRAVENOUS | Status: DC
  Administered 2014-11-12: 5 ug/min via INTRAVENOUS
  Filled 2014-11-12: qty 16

## 2014-11-12 NOTE — Progress Notes (Addendum)
Advanced Heart Failure Rounding Note  Primary Cardiologist:   Reason for referral: A/C systolic HF EF 5-10%  Subjective:    Robert Booth is a 44 y.o. male with hx of systolic HF with NICM secondary to prolonged ETOH abuse, EF 5-10% by Echo 11/11/14, and CKD stage 2.  He presented to [facility] with hemoptsysi. Found to have RLL PNA with procalcitonin > 8.0  Central line placed 8/12 with co-ox 27% and CVP 24 c/w cardiogenic shock. Started on milrinone and levophed. Lasix 80 IV bid. Diuresed well. Co-oc now > 60%. Feels much better. Cough nearly resolved.    Echo 10/13/14 EF 20% Echo 11/11/14 EF 5-10%   Objective:   Weight Range: 86.8 kg (191 lb 5.8 oz) Body mass index is 28.25 kg/(m^2).   Vital Signs:   Temp:  [97.1 F (36.2 C)-97.9 F (36.6 C)] 97.4 F (36.3 C) (08/13 0800) Pulse Rate:  [87-102] 88 (08/13 0800) Resp:  [17-34] 20 (08/13 0800) BP: (105-134)/(67-91) 124/82 mmHg (08/13 0800) SpO2:  [96 %-100 %] 99 % (08/13 0800) Weight:  [86.8 kg (191 lb 5.8 oz)] 86.8 kg (191 lb 5.8 oz) (08/13 0400) Last BM Date: 11/12/14  Weight change: Filed Weights   11/10/14 0500 11/11/14 0427 11/12/14 0400  Weight: 86.5 kg (190 lb 11.2 oz) 88.9 kg (195 lb 15.8 oz) 86.8 kg (191 lb 5.8 oz)    Intake/Output:   Intake/Output Summary (Last 24 hours) at 11/12/14 0954 Last data filed at 11/12/14 0952  Gross per 24 hour  Intake 1390.77 ml  Output   5025 ml  Net -3634.23 ml     Physical Exam: General: Sitting up in bed. Much more alert Neuro: Alert and oriented X 3. Moves all extremities spontaneously. Psych: Normal affect. HEENT: Normal Neck: RIJ TLC. Supple without bruits. JVP 10 with prominent CV waves Lungs: Resp regular and unlabored, CTA. Heart: PMI laterally displaced RRR s3, s4, or murmurs. +S3 Abdomen: Soft, non-tender, non-distended, BS + x 4.  Extremities: No clubbing, cyanosis. DP/PT/Radials 2+ and equal bilaterally. 1-2+ peripheral edema  Telemetry: Sinus  90s  Labs: CBC  Recent Labs  11/11/14 0223 11/12/14 0425  WBC 15.8* 14.7*  HGB 12.1* 10.9*  HCT 34.2* 30.8*  MCV 83.6 82.4  PLT 308 307   Basic Metabolic Panel  Recent Labs  11/11/14 0223 11/12/14 0425  NA 132* 131*  K 4.5 3.0*  CL 95* 94*  CO2 25 27  GLUCOSE 95 107*  BUN 43* 40*  CALCIUM 8.8* 8.1*  PHOS  --  2.9   Liver Function Tests  Recent Labs  11/11/14 0223 11/12/14 0425  AST 2616* 1751*  ALT 1251* 1213*  ALKPHOS 73 71  BILITOT 3.5* 5.3*  PROT 6.4* 6.2*  ALBUMIN 2.8* 2.6*   No results for input(s): LIPASE, AMYLASE in the last 72 hours. Cardiac Enzymes  Recent Labs  11/10/14 0825 11/10/14 1540 11/10/14 2057  TROPONINI 0.07* 0.06* 0.08*    BNP: BNP (last 3 results)  Recent Labs  10/12/14 1710 10/17/14 0438 11/09/14 0820  BNP 879.0* 1091.1* 1689.6*    ProBNP (last 3 results) No results for input(s): PROBNP in the last 8760 hours.   D-Dimer  Recent Labs  11/09/14 1148  DDIMER 1.82*   Hemoglobin A1C No results for input(s): HGBA1C in the last 72 hours. Fasting Lipid Panel No results for input(s): CHOL, HDL, LDLCALC, TRIG, CHOLHDL, LDLDIRECT in the last 72 hours. Thyroid Function Tests No results for input(s): TSH, T4TOTAL, T3FREE, THYROIDAB in the last 72  hours.  Invalid input(s): FREET3  Other results:     Imaging/Studies:  US Renal  11/10/2014   CLINICAL DATA:  Acute kidney injury  EXAM: RENAL / URINARY TRACT ULTRASOUND COMPLETE  COMPARISON:  None.  FINDINGS: Right Kidney:  Length: 11.7 cm. Echogenicity within normal limits. No mass or hydronephrosis visualized.  Left Kidney:  Length: 13.1 cm. Echogenicity within normal limits. No mass or hydronephrosis visualized.  Bladder:  Bladder is decompressed and therefore not evaluated.  IMPRESSION: Kidneys appear normal.  Bladder not evaluated.   Electronically Signed   By: Esperanza Heir M.D.   On: 11/10/2014 16:54   Nm Pulmonary Perf And Vent  11/10/2014   CLINICAL DATA:   Shortness of breath, chest pain, hemoptysis, history hypertension, non ischemic cardiomyopathy, former smoker  EXAM: NUCLEAR MEDICINE VENTILATION - PERFUSION LUNG SCAN  TECHNIQUE: Ventilation images were obtained in multiple projections using inhaled aerosol Tc-52m DTPA. Perfusion images were obtained in multiple projections after intravenous injection of Tc-47m MAA.  RADIOPHARMACEUTICALS:  39.0 mCi Technetium-4m DTPA aerosol inhalation and 5.9 mCi Technetium-89m MAA IV  COMPARISON:  None  Correlation: Chest radiograph 11/09/2014  FINDINGS: Ventilation: Impaired ventilation identified in RIGHT middle and RIGHT lower lobes. RIGHT lower lobe absent perfusion corresponds to area of radiographic abnormality on preceding chest radiograph and recent CT. No abnormalities at RIGHT middle lobe on recent chest radiograph. Heart appears enlarged.  Perfusion: Matching diminished perfusion involving the RIGHT middle and RIGHT lower lobes. No additional areas of segmental or subsegmental perfusion decrease. Heart appears enlarged.  Chest radiograph: Significant RIGHT lower lobe infiltrate. No definite RIGHT middle lobe infiltrate.  IMPRESSION: Matching decreased ventilation, decreased perfusion, and radiographic abnormalities in RIGHT lower lobe.  Matching decreased subsegmental ventilation and perfusion abnormalities in RIGHT middle lobe with clear chest radiograph.  No perfusion abnormalities of the LEFT lung.  Findings represent an intermediate probability of pulmonary embolism.   Electronically Signed   By: Ulyses Southward M.D.   On: 11/10/2014 15:37   Dg Chest Port 1 View  11/12/2014   CLINICAL DATA:  Respiratory failure  EXAM: PORTABLE CHEST - 1 VIEW  COMPARISON:  11/11/2014  FINDINGS: Persistent airspace disease in the right lower lobe. Trace right pleural effusion. Hazy left lower lobe airspace disease. No pneumothorax. Stable cardiomediastinal silhouette. Right jugular central venous catheter with the tip projecting over  the SVC.  No acute osseous abnormality.  IMPRESSION: 1. Persistent right lower lobe pneumonia. No significant interval change. Followup PA and lateral chest X-ray is recommended in 3-4 weeks following trial of antibiotic therapy to ensure resolution and exclude underlying malignancy. 2. Hazy left lower lobe airspace disease which may reflect atelectasis versus developing pneumonia.   Electronically Signed   By: Elige Ko   On: 11/12/2014 08:19   Dg Chest Port 1 View  11/11/2014   CLINICAL DATA:  Central line insertion.  EXAM: PORTABLE CHEST - 1 VIEW  COMPARISON:  Two-view chest x-ray 11/09/2014. CT of the chest 10/12/2014.  FINDINGS: The heart is enlarged. Any right IJ line is been placed. The tip projects over the right SVC, above the cavoatrial junction.  Progressive airspace consolidation is present in the right lower lobe. A right pleural effusion is suspected. The left lung is clear. Lung volumes are low.  IMPRESSION: 1. Interval placement of right IJ line without complication. The tip is in the mid SVC. 2. Stable cardiomegaly. 3. Progressive airspace consolidation in the right lower lobe.   Electronically Signed   By: Marin Roberts  M.D.   On: 11/11/2014 20:53   US Abdomen Limited Ruq  11/11/2014   CLINICAL DATA:  Elevated liver function tests.  EXAM: US ABDOMEN LIMITED - RIGHT UPPER QUADRANT  COMPARISON:  None.  FINDINGS: Gallbladder:  No gallstones are identified. The gallbladder is incompletely distended. The wall is thickened at 0.5 cm. No pericholecystic fluid. Sonographer reports negative Murphy's sign.  Common bile duct:  Diameter: 0.4 cm.  Liver:  No focal lesion identified. The liver demonstrates mildly increased echogenicity. The portal vein is patent.  IMPRESSION: Gallbladder wall thickening without stones or pericholecystic fluid may be due to underdistention or hypoproteinemia. No ascites is identified.  Mildly increased echogenicity of the liver consistent with fatty infiltration.    Electronically Signed   By: Drusilla Kanner M.D.   On: 11/11/2014 11:11    Latest Echo  Latest Cath   Medications:     Scheduled Medications: . antiseptic oral rinse  7 mL Mouth Rinse BID  . cefTAZidime (FORTAZ)  IV  2 g Intravenous Q12H  . Chlorhexidine Gluconate Cloth  6 each Topical Q0600  . folic acid  1 mg Oral Daily  . furosemide  80 mg Intravenous BID  . multivitamin with minerals  1 tablet Oral Daily  . mupirocin ointment  1 application Nasal BID  . pantoprazole  40 mg Oral Daily  . potassium chloride  40 mEq Oral Q4H  . potassium chloride  40 mEq Oral BID  . sodium chloride  3 mL Intravenous Q12H  . thiamine  100 mg Oral Daily   Or  . thiamine  100 mg Intravenous Daily  . vancomycin  750 mg Intravenous Q12H    Infusions: . heparin 1,900 Units/hr (11/12/14 0700)  . milrinone 0.375 mcg/kg/min (11/12/14 0700)  . norepinephrine (LEVOPHED) Adult infusion 2 mcg/min (11/12/14 0933)    PRN Medications: benzonatate, LORazepam **OR** LORazepam, metoCLOPramide (REGLAN) injection, morphine injection, polyethylene glycol, traMADol   Assessment/Plan   1. Acute on chronic systolic CHF with NICM, -> cardiogenic shock 2. AKI on CKD stage 2 3. Elevated troponin 4. ETOH abuse quit February 2016 5. Acute renal failure 6. Shock liver 7. Acute respiratory failure 8. PNA with hemoptysis 9. NSVT 10. Hyponatremia 11. LV mural thrombus  He has profound cardiogenic shock in setting of severe ETOH cardiomyopathy (EF 10%) and RLL PNA. Now improved with inotropic support and diuresis. Renal function and LFTs improving. Will wean levophed back and continue milrinone. Continue IV diuresis for one more day. Antibiotics per primary team. Hold heparin with INR > 4.0 in setting of recent coumadin and shock liver.   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 May be transplant candidate if remain abstinent from ETOH. Will check blood type. Given that he hasn't had ETOH since 2/16 can stop  CIWA protocol.   The patient is critically ill with multiple organ systems failure and requires high complexity decision making for assessment and support, frequent evaluation and titration of therapies, application of advanced monitoring technologies and extensive interpretation of multiple databases.   Critical Care Time devoted to patient care services described in this note is 45 Minutes.  Arlando Leisinger,MD 9:54 AM Advanced Heart Failure Team Pager [phone] (M-F; 7a - 4p)  Please contact [facility] for night-coverage after hours (4p -7a ) and weekends on amion.

## 2014-11-12 NOTE — Progress Notes (Signed)
S: cough better, no hemoptysis.  Eating well O:BP 124/82 mmHg  Pulse 88  Temp(Src) 97.9 F (36.6 C) (Oral)  Resp 20  Ht  (1.753 m)  Wt 86.8 kg (191 lb 5.8 oz)  BMI 28.25 kg/m2  SpO2 99%  Intake/Output Summary (Last 24 hours) at 11/12/14 0824 Last data filed at 11/12/14 0800  Gross per 24 hour  Intake 1197.2 ml  Output   4275 ml  Net -3077.8 ml   Weight change: -2.1 kg (-4 lb 10.1 oz) ZOX:WRUEA and alert CVS:RRR Resp:crackles rt base Abd:+ BS NTND Ext: 0-tr edema NEURO:CNI Ox3 no asterixis   . antiseptic oral rinse  7 mL Mouth Rinse BID  . cefTAZidime (FORTAZ)  IV  2 g Intravenous Q12H  . Chlorhexidine Gluconate Cloth  6 each Topical Q0600  . folic acid  1 mg Oral Daily  . furosemide  80 mg Intravenous BID  . multivitamin with minerals  1 tablet Oral Daily  . mupirocin ointment  1 application Nasal BID  . pantoprazole  40 mg Oral Daily  . potassium chloride  40 mEq Oral Q4H  . potassium chloride  40 mEq Oral BID  . sodium chloride  3 mL Intravenous Q12H  . thiamine  100 mg Oral Daily   Or  . thiamine  100 mg Intravenous Daily  . vancomycin  750 mg Intravenous Q12H   US Renal  11/10/2014   CLINICAL DATA:  Acute kidney injury  EXAM: RENAL / URINARY TRACT ULTRASOUND COMPLETE  COMPARISON:  None.  FINDINGS: Right Kidney:  Length: 11.7 cm. Echogenicity within normal limits. No mass or hydronephrosis visualized.  Left Kidney:  Length: 13.1 cm. Echogenicity within normal limits. No mass or hydronephrosis visualized.  Bladder:  Bladder is decompressed and therefore not evaluated.  IMPRESSION: Kidneys appear normal.  Bladder not evaluated.   Electronically Signed   By: Esperanza Heir M.D.   On: 11/10/2014 16:54   Nm Pulmonary Perf And Vent  11/10/2014   CLINICAL DATA:  Shortness of breath, chest pain, hemoptysis, history hypertension, non ischemic cardiomyopathy, former smoker  EXAM: NUCLEAR MEDICINE VENTILATION - PERFUSION LUNG SCAN  TECHNIQUE: Ventilation images were  obtained in multiple projections using inhaled aerosol Tc-68m DTPA. Perfusion images were obtained in multiple projections after intravenous injection of Tc-71m MAA.  RADIOPHARMACEUTICALS:  39.0 mCi Technetium-36m DTPA aerosol inhalation and 5.9 mCi Technetium-40m MAA IV  COMPARISON:  None  Correlation: Chest radiograph 11/09/2014  FINDINGS: Ventilation: Impaired ventilation identified in RIGHT middle and RIGHT lower lobes. RIGHT lower lobe absent perfusion corresponds to area of radiographic abnormality on preceding chest radiograph and recent CT. No abnormalities at RIGHT middle lobe on recent chest radiograph. Heart appears enlarged.  Perfusion: Matching diminished perfusion involving the RIGHT middle and RIGHT lower lobes. No additional areas of segmental or subsegmental perfusion decrease. Heart appears enlarged.  Chest radiograph: Significant RIGHT lower lobe infiltrate. No definite RIGHT middle lobe infiltrate.  IMPRESSION: Matching decreased ventilation, decreased perfusion, and radiographic abnormalities in RIGHT lower lobe.  Matching decreased subsegmental ventilation and perfusion abnormalities in RIGHT middle lobe with clear chest radiograph.  No perfusion abnormalities of the LEFT lung.  Findings represent an intermediate probability of pulmonary embolism.   Electronically Signed   By: Ulyses Southward M.D.   On: 11/10/2014 15:37   Dg Chest Port 1 View  11/12/2014   CLINICAL DATA:  Respiratory failure  EXAM: PORTABLE CHEST - 1 VIEW  COMPARISON:  11/11/2014  FINDINGS: Persistent airspace disease in the right  lower lobe. Trace right pleural effusion. Hazy left lower lobe airspace disease. No pneumothorax. Stable cardiomediastinal silhouette. Right jugular central venous catheter with the tip projecting over the SVC.  No acute osseous abnormality.  IMPRESSION: 1. Persistent right lower lobe pneumonia. No significant interval change. Followup PA and lateral chest X-ray is recommended in 3-4 weeks following  trial of antibiotic therapy to ensure resolution and exclude underlying malignancy. 2. Hazy left lower lobe airspace disease which may reflect atelectasis versus developing pneumonia.   Electronically Signed   By: Elige Ko   On: 11/12/2014 08:19   Dg Chest Port 1 View  11/11/2014   CLINICAL DATA:  Central line insertion.  EXAM: PORTABLE CHEST - 1 VIEW  COMPARISON:  Two-view chest x-ray 11/09/2014. CT of the chest 10/12/2014.  FINDINGS: The heart is enlarged. Any right IJ line is been placed. The tip projects over the right SVC, above the cavoatrial junction.  Progressive airspace consolidation is present in the right lower lobe. A right pleural effusion is suspected. The left lung is clear. Lung volumes are low.  IMPRESSION: 1. Interval placement of right IJ line without complication. The tip is in the mid SVC. 2. Stable cardiomegaly. 3. Progressive airspace consolidation in the right lower lobe.   Electronically Signed   By: Marin Roberts M.D.   On: 11/11/2014 20:53   US Abdomen Limited Ruq  11/11/2014   CLINICAL DATA:  Elevated liver function tests.  EXAM: US ABDOMEN LIMITED - RIGHT UPPER QUADRANT  COMPARISON:  None.  FINDINGS: Gallbladder:  No gallstones are identified. The gallbladder is incompletely distended. The wall is thickened at 0.5 cm. No pericholecystic fluid. Sonographer reports negative Murphy's sign.  Common bile duct:  Diameter: 0.4 cm.  Liver:  No focal lesion identified. The liver demonstrates mildly increased echogenicity. The portal vein is patent.  IMPRESSION: Gallbladder wall thickening without stones or pericholecystic fluid may be due to underdistention or hypoproteinemia. No ascites is identified.  Mildly increased echogenicity of the liver consistent with fatty infiltration.   Electronically Signed   By: Drusilla Kanner M.D.   On: 11/11/2014 11:11   BMET    Component Value Date/Time   NA 131* 11/12/2014 0425   K 3.0* 11/12/2014 0425   CL 94* 11/12/2014 0425   CO2  27 11/12/2014 0425   GLUCOSE 107* 11/12/2014 0425   BUN 40* 11/12/2014 0425   CREATININE 2.17* 11/12/2014 0425   CREATININE 1.39* 11/08/2014 1236   CALCIUM 8.1* 11/12/2014 0425   GFRNONAA 35* 11/12/2014 0425   GFRAA 41* 11/12/2014 0425   CBC    Component Value Date/Time   WBC 14.7* 11/12/2014 0425   RBC 3.74* 11/12/2014 0425   HGB 10.9* 11/12/2014 0425   HCT 30.8* 11/12/2014 0425   PLT 307 11/12/2014 0425   MCV 82.4 11/12/2014 0425   MCH 29.1 11/12/2014 0425   MCHC 35.4 11/12/2014 0425   RDW 15.0 11/12/2014 0425   LYMPHSABS 1.4 11/09/2014 0820   MONOABS 0.8 11/09/2014 0820   EOSABS 0.0 11/09/2014 0820   BASOSABS 0.0 11/09/2014 0820     Assessment:  1. Acute on subacute/CKD  ? Vasculitis with hx hemoptysis though benign urine micro argues against this and ANA and ANCA neg.  This may all be sec to extremely poor CO.  UO good, Scr sl lower 2. Severe cardiomyopathy, EF 5-10% 3. Elevated LFT's Hep and HIV neg 4. Hypokalemia  Plan: 1. Replace K as you are doing 2.  Cont lasix 3. Daily labs  4. Prognosis is poor   Glynnis Gavel T

## 2014-11-12 NOTE — Progress Notes (Signed)
ANTICOAGULATION NOTE  Pharmacy Consult for heparin  Indication: hx LV thrombus, probable PE  Allergies  Allergen Reactions  . Celexa [Citalopram Hydrobromide] Other (See Comments)    DISORIENTED  . Lisinopril Cough    Patient Measurements: Height: 5\' 9"  (175.3 cm) Weight: 191 lb 5.8 oz (86.8 kg) IBW/kg (Calculated) : 70.7   Vital Signs: Temp: 97.4 F (36.3 C) (08/13 0800) Temp Source: Axillary (08/13 0800) BP: 124/82 mmHg (08/13 0800) Pulse Rate: 88 (08/13 0800)  Labs:  Recent Labs  11/10/14 0341 11/10/14 0825 11/10/14 1540 11/10/14 1821 11/10/14 2057 11/11/14 0223 11/12/14 0424 11/12/14 0425  HGB  --   --  12.9*  --   --  12.1*  --  10.9*  HCT  --   --  37.0*  --   --  34.2*  --  30.8*  PLT  --   --  293  --   --  308  --  307  LABPROT 27.7*  --   --   --   --  32.8* 37.7*  --   INR 2.62*  --   --   --   --  3.29* 3.95*  --   HEPARINUNFRC 0.20*  --   --  0.54  --  0.35 0.54  --   CREATININE 2.03*  --   --   --   --  2.41*  --  2.17*  TROPONINI  --  0.07* 0.06*  --  0.08*  --   --   --     Estimated Creatinine Clearance: 47.4 mL/min (by C-G formula based on Cr of 2.17).  Medications: Heparin @ 1900 units/hr  Assessment: Robert Booth on coumadin pta for hx LV thrombus, admitted with hemoptysis and subtherapeutic INR. Heparin bridge was started. D-dimer came back elevated and VQ scan showed intermediate probability for PE. Heparin level is therapeutic this morning. Last dose of coumadin given was 8/9 however INR has since trended up 2.7>2.62>3.29>3.95, likely due to shock liver. Spoke to Dr. Gala Romney and ok to hold heparin until falls back below 2. Hemoptysis resolved. CBC is stable.  Goal of Therapy:  Heparin level 0.3-0.7 units/ml Monitor platelets by anticoagulation protocol: Yes   Plan:  1) Hold heparin 2) Follow up daily INR and resume heparin once < 2  11/12/2014 9:57 AM

## 2014-11-12 NOTE — Progress Notes (Signed)
Encouraged patient to walk with RN in hallway.  Patient requested to wait until later in day.  Will continue to encourage patient to ambulate. [place], Mitzi Hansen

## 2014-11-12 NOTE — Consult Note (Signed)
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 He was undergoing care at [facility] and now has transferred to [facility]. 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 He was admitted in 09/2014 for right-sided pleuritic chest pain and hemoptysis, chest x-ray showed right lower lobe infiltrate, CT chest showed patchy right lower lobe consolidation, interestingly disappears wedge-shaped on my review, and there was some focal hypoperfusion described on CT angio-and the question of distal pulmonary embolism was raised. He is treated with antibiotics, hemoptysis resolved chest pain improved for about 2 weeks and then recurred for the last 4 days. He has been therapeutic on Coumadin-on interim checks-but on admit his INR was 1.7. He reports frank hemoptysis-less than 5 mL per day. He has been maintained on heparin. Chest x-ray shows new nodular consolidation right perihilar and persistent infiltrate in the right lower lobe.  Unfortunately due to diuretics his renal function has worsened, lowest creatinine was 1.2 now up to 2.0   he is being treated with ceftaz and vancomycin for HCAP  SIGNIFICANT EVENTS  7/13 - admitted for hemoptysis-treated as CAP 8/10 adm for hemoptysis  STUDIES:  CT angio 7/ 13 >> Patchy right lower lobe consolidation most compatible with pneumonia and less likely representing infarct. Apparent non-opacification of the distal subsegmental pulmonary artery branch extending into the consolidated area may be artifactual or represent focal hypoperfusion ? Distal PE  SUBJECTIVE: No hemoptysis overnight but developed cardiogenic shock, seen by cards, TLC was placed and milrinone started  overnight.  VITAL SIGNS: Temp:  [97.1 F (36.2 C)-97.9 F (36.6 C)] 97.4 F (36.3 C) (08/13 0800) Pulse Rate:  [87-102] 88 (08/13 0800) Resp:  [17-34] 20 (08/13 0800) BP: (105-134)/(67-91) 124/82 mmHg (08/13 0800) SpO2:  [96 %-100 %] 99 % (08/13 0800) Weight:  [86.8 kg (191 lb 5.8 oz)] 86.8 kg (191 lb 5.8 oz) (08/13 0400)  PHYSICAL EXAMINATION: Gen: Pleasant, well-nourished, in no distress, normal affect ENT: No lesions, no post nasal drip Neck: No JVD, no thyromegaly, no carotid bruits Lungs: no use of accessory muscles, no dullness to percussion, RLLrales , no rhonchi  Cardiovascular: Rhythm regular, heart sounds  normal, no murmurs, no peripheral edema Abdomen: soft and non-tender, no hepatosplenomegaly, BS normal. Musculoskeletal: No deformities, no cyanosis or clubbing Neuro:  alert, non focal Skin:  Warm, no lesions/ rash  Recent Labs Lab 11/10/14 0341 11/11/14 0223 11/12/14 0425  NA 133* 132* 131*  K 5.2* 4.5 3.0*  CL 97* 95* 94*  CO2 18* 25 27  BUN 33* 43* 40*  CREATININE 2.03* 2.41* 2.17*  GLUCOSE 91 95 107*    Recent Labs Lab 11/10/14 1540 11/11/14 0223 11/12/14 0425  HGB 12.9* 12.1* 10.9*  HCT 37.0* 34.2* 30.8*  WBC 18.3* 15.8* 14.7*  PLT 293 308 307   US Renal  11/10/2014   CLINICAL DATA:  Acute kidney injury  EXAM: RENAL / URINARY TRACT ULTRASOUND COMPLETE  COMPARISON:  None.  FINDINGS: Right Kidney:  Length: 11.7 cm. Echogenicity within normal limits. No mass or hydronephrosis visualized.  Left Kidney:  Length: 13.1 cm. Echogenicity within normal limits. No mass or hydronephrosis visualized.  Bladder:  Bladder is decompressed and therefore not evaluated.  IMPRESSION: Kidneys appear normal.  Bladder not evaluated.  Electronically Signed   By: Esperanza Heir M.D.   On: 11/10/2014 16:54   Nm Pulmonary Perf And Vent  11/10/2014   CLINICAL DATA:  Shortness of breath, chest pain, hemoptysis, history hypertension, non ischemic cardiomyopathy, former smoker   EXAM: NUCLEAR MEDICINE VENTILATION - PERFUSION LUNG SCAN  TECHNIQUE: Ventilation images were obtained in multiple projections using inhaled aerosol Tc-85m DTPA. Perfusion images were obtained in multiple projections after intravenous injection of Tc-59m MAA.  RADIOPHARMACEUTICALS:  39.0 mCi Technetium-81m DTPA aerosol inhalation and 5.9 mCi Technetium-52m MAA IV  COMPARISON:  None  Correlation: Chest radiograph 11/09/2014  FINDINGS: Ventilation: Impaired ventilation identified in RIGHT middle and RIGHT lower lobes. RIGHT lower lobe absent perfusion corresponds to area of radiographic abnormality on preceding chest radiograph and recent CT. No abnormalities at RIGHT middle lobe on recent chest radiograph. Heart appears enlarged.  Perfusion: Matching diminished perfusion involving the RIGHT middle and RIGHT lower lobes. No additional areas of segmental or subsegmental perfusion decrease. Heart appears enlarged.  Chest radiograph: Significant RIGHT lower lobe infiltrate. No definite RIGHT middle lobe infiltrate.  IMPRESSION: Matching decreased ventilation, decreased perfusion, and radiographic abnormalities in RIGHT lower lobe.  Matching decreased subsegmental ventilation and perfusion abnormalities in RIGHT middle lobe with clear chest radiograph.  No perfusion abnormalities of the LEFT lung.  Findings represent an intermediate probability of pulmonary embolism.   Electronically Signed   By: Ulyses Southward M.D.   On: 11/10/2014 15:37   Dg Chest Port 1 View  11/12/2014   CLINICAL DATA:  Respiratory failure  EXAM: PORTABLE CHEST - 1 VIEW  COMPARISON:  11/11/2014  FINDINGS: Persistent airspace disease in the right lower lobe. Trace right pleural effusion. Hazy left lower lobe airspace disease. No pneumothorax. Stable cardiomediastinal silhouette. Right jugular central venous catheter with the tip projecting over the SVC.  No acute osseous abnormality.  IMPRESSION: 1. Persistent right lower lobe pneumonia. No significant  interval change. Followup PA and lateral chest X-ray is recommended in 3-4 weeks following trial of antibiotic therapy to ensure resolution and exclude underlying malignancy. 2. Hazy left lower lobe airspace disease which may reflect atelectasis versus developing pneumonia.   Electronically Signed   By: Elige Ko   On: 11/12/2014 08:19   Dg Chest Port 1 View  11/11/2014   CLINICAL DATA:  Central line insertion.  EXAM: PORTABLE CHEST - 1 VIEW  COMPARISON:  Two-view chest x-ray 11/09/2014. CT of the chest 10/12/2014.  FINDINGS: The heart is enlarged. Any right IJ line is been placed. The tip projects over the right SVC, above the cavoatrial junction.  Progressive airspace consolidation is present in the right lower lobe. A right pleural effusion is suspected. The left lung is clear. Lung volumes are low.  IMPRESSION: 1. Interval placement of right IJ line without complication. The tip is in the mid SVC. 2. Stable cardiomegaly. 3. Progressive airspace consolidation in the right lower lobe.   Electronically Signed   By: Marin Roberts M.D.   On: 11/11/2014 20:53   US Abdomen Limited Ruq  11/11/2014   CLINICAL DATA:  Elevated liver function tests.  EXAM: US ABDOMEN LIMITED - RIGHT UPPER QUADRANT  COMPARISON:  None.  FINDINGS: Gallbladder:  No gallstones are identified. The gallbladder is incompletely distended. The wall is thickened at 0.5 cm. No pericholecystic fluid. Sonographer reports negative Murphy's sign.  Common bile duct:  Diameter: 0.4 cm.  Liver:  No focal lesion identified. The liver demonstrates mildly increased echogenicity. The portal vein is patent.  IMPRESSION:  Gallbladder wall thickening without stones or pericholecystic fluid may be due to underdistention or hypoproteinemia. No ascites is identified.  Mildly increased echogenicity of the liver consistent with fatty infiltration.   Electronically Signed   By: Drusilla Kanner M.D.   On: 11/11/2014 11:11   I reviewed AM CXR, evidence of  infiltrate on the right and some pulmonary vascular congestion.  Discussed with PCCM-NP and radiology.  ASSESSMENT / PLAN:   Hemoptysis - RLL CAP -treated 09/2014, now with new infiltrate. Both these infiltrates appear wedge-shaped-could be related to aspirated blood. CT angiogram 7/16 is very suspicious for pulmonary infarct from distal pulmonary embolism. He does need anticoagulation for LV thrombus, hence heparin cannot be stopped-unless hemoptysis worsens. Although he does have some pleuritic pain and mild leukocytosis, I really doubt that this is infectious  Recommend- Venous duplex negative, no need for filter. VQ scan is intermediate probability, continue anticoagulation given left mural thrombus clot, will need anti-coag regardless so will not pursue this any further at this point. Procalcitonin 8.33>8.41 making PNA most likely diagnosis of the present pulmonary infiltrate. Vanc/Ceftaz as ordered, continue for a total of 8 days. Cardiogenic shock to be managed by cards. Continue heparin since hemoptysis resolved. No need for bronch. PCCM will be available PRN.  Alyson Reedy, M.D. [facility] Pulmonary/Critical Care Medicine. Pager: [phone]. After hours pager: [phone].  11/12/2014, 9:50 AM

## 2014-11-12 NOTE — Progress Notes (Signed)
Family Medicine Teaching Service Daily Progress Note Intern Pager: [phone]  Patient name: Robert Booth Medical record number: [id number] Date of birth: [birth year] Age: 44 y.o. Gender: male  Primary Care Provider: Lora Paula, MD Consultants: Cardiology, Pulmonology Code Status: FULL  Pt Overview and Major Events to Date:  - 7/13-7/19/16: Previous hospitalization for hemoptysis treated as HCAP. - 11/11/14: ECHO with LVEF 5-10% (decreased from 20% on 10/13/14) - 11/11/14: Transferred to Cardiac SDU/ICU - 11/11/14: Central line placed, inotrope started  Assessment and Plan: Robert Booth is a 44 y.o. male presenting with cough, SOB, hemoptysis and pleuritic chest pain. PMH is significant for HTN, dilated cardiomyopathy (NICM, reduced EF), alcoholism, and anxiety. Suspect pulmonary embolism given tachycardia, SOB, subtherapeutic INR on admission on coumadin, known LV thrombus, and positive D-dimer. With AKI, also considering Goodpasture's/Wegener's. BNP elevated to 1689.6.Was 1091.1 at last admission. Acute on chronic CHF exacerbation with cardiogenic and hepatic shock. Central line in place for administration of inotropes and pressors.   # HFrEF: Now in cardiogenic shock. Weight down 4lb 10 oz from yesterday (8/12) after diuresis. Net negative 2.9 L. No crackles on exam. - Heart Failure team consulted and following, appreciate recommendations.  - Milrinone 0.375 mcg/kg/min, norepinephrine 20.053 mcg/min titrated.  - Cardiology consulted with recommendations to start spironolactone on discharge.  - Continue IV lasix 80 mg BID.  - Obtain daily weights and strict I/Os - Trending carboxyhemoglobin  # Hemoptysis: Per patient, resolved. V/Q scan intermediate risk. Hemoglobin decreased to 10.9 from 12.1 < 12.9. Negative ANA and ANCA titers.  - Hemoglobin decreasing. Continue to trend CBCs. - Pharmacy consulting for anticoag management. Continue heparin gtt with goal level of 0.3-0.7.  -  Holding coumadin.   - Pulmonology consulted, appreciate recommendations. Low suspicion for mass. No need for bronchoscopy at this time.  # Hepatic Failure: MELD score of 35, AST 1290, ALT 697, INR increasing. LE dopplers showed pulsatile venous flow suggestive of right-sided heart pressure, concerning for hepatic congestion. RUQ U/S negative for ascites. Neg Hepatitis panel. Acetaminophen level normal.  -  PT/INR continues to increase. 37.7/3.95 up from 32.8/3.29.   # Chest Pain: Resolved right-sided chest pain. Troponins trended and peaked at 0.09.   # Lower Extremity Swelling: Improving. LE venous dopplers negative for DVT.   # AKI: Improving. 2.17 < 2.41 < 2.03 < 1.6 (1.22 in July) Suspect AKI 2/2 hypoperfusion in setting of cardiac shock. Urine output high on lasix. Salicylate level normal. Normal renal U/S. UA positive for granular casts, consistent with ATN. UDS positive for opiates (received during hospitalization) - Continue holding losartan. - Nephrology consulted. AntiGBM AB pending.   # Increased INR: 3.95 < 3.29 < 2.62 < 1.72. Elevated LFTs.  - Continue heparin gtt given high suspicion for PE and known left ventricular thrombus.  - Heparin level within target range of 0.3-0.7 at 0.35. - Continue, unless hemoptysis worsens.   # Nausea: Resolved. - Metoclopramide ordered q6h prn. Limit use given QTc>500.  # HCAP: Patient afebrile. CXR with new nodular consolidation. WBC decreasing: 14.7 < 15.8 < 18.3. Procalcitionin decreasing: 8.07 < 8.41 < 8.33. Clinical picture most consistent with CHF exacerbation rather than infection.  - Discontinue IV vanc as low suspicion of MRSA pneumonia.  - Continue IV ceftazidime.  - Following procalcitonin. As decreases, switch to PO.   #HTN: Continues to be normotensive - Continue home metoprolol.   #SIRS: Tachycardia improved overnight. Still tachypneic.  - Blood cx NGTD.  - Procalcitonin 8.07  # H/o Anxiety: not  currently medically  treated  # Alcohol Abuse: Denies ETOH use (states last drink was in February) - CIWA protocol.   FEN/GI: heart healthy diet  PPx: protonix 40 mg daily  Disposition: Admitted to the [facility] on telemetry.   Subjective:  Robert Booth states his breathing is much improved. He has not been coughing since last night and has had no hemoptysis since then. Nausea has resolved, and his appetite has returned. He thinks the swelling in his legs has improved, too. He does not have chest pain.   Objective: Temp:  [97.1 F (36.2 C)-98 F (36.7 C)] 97.9 F (36.6 C) (08/13 0350) Pulse Rate:  [52-104] 89 (08/13 0600) Resp:  [15-37] 18 (08/13 0600) BP: (105-134)/(67-91) 114/67 mmHg (08/13 0600) SpO2:  [94 %-100 %] 96 % (08/13 0600) Weight:  [191 lb 5.8 oz (86.8 kg)] 191 lb 5.8 oz (86.8 kg) (08/13 0400) Physical Exam: General: Awake, sitting at edge of bed. Nasal canula in place.  Cardiovascular: Tachycardic. S3 best heard over right upper sternal border but quieter than on previous day's exam. Brisk cap refill.  Chest: No longer tender to palpation of right chest wall.  Respiratory: CTAB.   Abdomen: Soft, non-tender, nondistended, positive BS.  Extremities: Decreased LE edema from yesterday. 3+ pitting edema localized to medial malleolus of RLE. DP pulses 2+ bilaterally.   Neuro: AOx3. Follows commands. Asks appropriate questions.  Skin: WWP.   Laboratory:  Recent Labs Lab 11/10/14 1540 11/11/14 0223 11/12/14 0425  WBC 18.3* 15.8* 14.7*  HGB 12.9* 12.1* 10.9*  HCT 37.0* 34.2* 30.8*  PLT 293 308 307    Recent Labs Lab 11/10/14 0341 11/10/14 1540 11/11/14 0223 11/12/14 0425  NA 133*  --  132* 131*  K 5.2*  --  4.5 3.0*  CL 97*  --  95* 94*  CO2 18*  --  25 27  BUN 33*  --  43* 40*  CREATININE 2.03*  --  2.41* 2.17*  CALCIUM 8.7*  --  8.8* 8.1*  PROT  --  7.1 6.4* 6.2*  BILITOT  --  3.3* 3.5* 5.3*  ALKPHOS  --  75 73 71  ALT  --  697* 1251* 1213*  AST   --  1290* 2616* 1751*  GLUCOSE 91  --  95 107*    Imaging/Diagnostic Tests:  Dg Chest 2 View  11/09/2014   CLINICAL DATA:  Recent pneumonia  EXAM: CHEST  2 VIEW  COMPARISON:  10/13/2014  FINDINGS: Borderline cardiomegaly. There is new nodular consolidation right perihilar region. Persistent infiltrate/ consolidation right lower lobe. Findings suspicious for recurrent multifocal pneumonia. No pulmonary edema. Follow-up to resolution after appropriate treatment recommended.  IMPRESSION: There is new nodular consolidation right perihilar region. Persistent infiltrate/ consolidation right lower lobe. Findings suspicious for recurrent multifocal pneumonia. No pulmonary edema. Follow-up to resolution after appropriate treatment recommended.   Electronically Signed   By: Natasha Mead M.D.   On: 11/09/2014 08:08   US Renal  11/10/2014   CLINICAL DATA:  Acute kidney injury  EXAM: RENAL / URINARY TRACT ULTRASOUND COMPLETE  COMPARISON:  None.  FINDINGS: Right Kidney:  Length: 11.7 cm. Echogenicity within normal limits. No mass or hydronephrosis visualized.  Left Kidney:  Length: 13.1 cm. Echogenicity within normal limits. No mass or hydronephrosis visualized.  Bladder:  Bladder is decompressed and therefore not evaluated.  IMPRESSION: Kidneys appear normal.  Bladder not evaluated.   Electronically Signed   By: Esperanza Heir M.D.   On: 11/10/2014  16:54   Nm Pulmonary Perf And Vent  11/10/2014   CLINICAL DATA:  Shortness of breath, chest pain, hemoptysis, history hypertension, non ischemic cardiomyopathy, former smoker  EXAM: NUCLEAR MEDICINE VENTILATION - PERFUSION LUNG SCAN  TECHNIQUE: Ventilation images were obtained in multiple projections using inhaled aerosol Tc-71m DTPA. Perfusion images were obtained in multiple projections after intravenous injection of Tc-15m MAA.  RADIOPHARMACEUTICALS:  39.0 mCi Technetium-36m DTPA aerosol inhalation and 5.9 mCi Technetium-8m MAA IV  COMPARISON:  None  Correlation: Chest  radiograph 11/09/2014  FINDINGS: Ventilation: Impaired ventilation identified in RIGHT middle and RIGHT lower lobes. RIGHT lower lobe absent perfusion corresponds to area of radiographic abnormality on preceding chest radiograph and recent CT. No abnormalities at RIGHT middle lobe on recent chest radiograph. Heart appears enlarged.  Perfusion: Matching diminished perfusion involving the RIGHT middle and RIGHT lower lobes. No additional areas of segmental or subsegmental perfusion decrease. Heart appears enlarged.  Chest radiograph: Significant RIGHT lower lobe infiltrate. No definite RIGHT middle lobe infiltrate.  IMPRESSION: Matching decreased ventilation, decreased perfusion, and radiographic abnormalities in RIGHT lower lobe.  Matching decreased subsegmental ventilation and perfusion abnormalities in RIGHT middle lobe with clear chest radiograph.  No perfusion abnormalities of the LEFT lung.  Findings represent an intermediate probability of pulmonary embolism.   Electronically Signed   By: Ulyses Southward M.D.   On: 11/10/2014 15:37   Dg Chest Port 1 View  11/11/2014   CLINICAL DATA:  Central line insertion.  EXAM: PORTABLE CHEST - 1 VIEW  COMPARISON:  Two-view chest x-ray 11/09/2014. CT of the chest 10/12/2014.  FINDINGS: The heart is enlarged. Any right IJ line is been placed. The tip projects over the right SVC, above the cavoatrial junction.  Progressive airspace consolidation is present in the right lower lobe. A right pleural effusion is suspected. The left lung is clear. Lung volumes are low.  IMPRESSION: 1. Interval placement of right IJ line without complication. The tip is in the mid SVC. 2. Stable cardiomegaly. 3. Progressive airspace consolidation in the right lower lobe.   Electronically Signed   By: Marin Roberts M.D.   On: 11/11/2014 20:53   US Abdomen Limited Ruq  11/11/2014   CLINICAL DATA:  Elevated liver function tests.  EXAM: US ABDOMEN LIMITED - RIGHT UPPER QUADRANT  COMPARISON:   None.  FINDINGS: Gallbladder:  No gallstones are identified. The gallbladder is incompletely distended. The wall is thickened at 0.5 cm. No pericholecystic fluid. Sonographer reports negative Murphy's sign.  Common bile duct:  Diameter: 0.4 cm.  Liver:  No focal lesion identified. The liver demonstrates mildly increased echogenicity. The portal vein is patent.  IMPRESSION: Gallbladder wall thickening without stones or pericholecystic fluid may be due to underdistention or hypoproteinemia. No ascites is identified.  Mildly increased echogenicity of the liver consistent with fatty infiltration.   Electronically Signed   By: Drusilla Kanner M.D.   On: 11/11/2014 11:11    Casey Burkitt, MD 11/12/2014, 7:19 AM PGY-1, [facility] Family Medicine FPTS Intern pager: [phone], text pages welcome

## 2014-11-13 LAB — CARBOXYHEMOGLOBIN
Carboxyhemoglobin: 1.7 % — ABNORMAL HIGH (ref 0.5–1.5)
Methemoglobin: 1.3 % (ref 0.0–1.5)
O2 Saturation: 65.1 %
TOTAL HEMOGLOBIN: 13.6 g/dL (ref 13.5–18.0)

## 2014-11-13 LAB — BASIC METABOLIC PANEL
ANION GAP: 9 (ref 5–15)
BUN: 23 mg/dL — AB (ref 6–20)
CALCIUM: 8.1 mg/dL — AB (ref 8.9–10.3)
CHLORIDE: 95 mmol/L — AB (ref 101–111)
CO2: 32 mmol/L (ref 22–32)
Creatinine, Ser: 1.22 mg/dL (ref 0.61–1.24)
GFR calc non Af Amer: >60 mL/min (ref >60)
Glucose, Bld: 111 mg/dL — ABNORMAL HIGH (ref 65–99)
Potassium: 3.2 mmol/L — ABNORMAL LOW (ref 3.5–5.1)
SODIUM: 136 mmol/L (ref 135–145)

## 2014-11-13 LAB — CBC
HEMATOCRIT: 32.5 % — AB (ref 39.0–52.0)
HEMOGLOBIN: 11.5 g/dL — AB (ref 13.0–17.0)
MCH: 29.6 pg (ref 26.0–34.0)
MCHC: 35.4 g/dL (ref 30.0–36.0)
MCV: 83.8 fL (ref 78.0–100.0)
Platelets: 294 10*3/uL (ref 150–400)
RBC: 3.88 MIL/uL — ABNORMAL LOW (ref 4.22–5.81)
RDW: 15.1 % (ref 11.5–15.5)
WBC: 10.4 10*3/uL (ref 4.0–10.5)

## 2014-11-13 LAB — HEPARIN LEVEL (UNFRACTIONATED): HEPARIN UNFRACTIONATED: 0.41 [IU]/mL (ref 0.30–0.70)

## 2014-11-13 LAB — PROTIME-INR
INR: 1.94 — ABNORMAL HIGH (ref 0.00–1.49)
Prothrombin Time: 22 seconds — ABNORMAL HIGH (ref 11.6–15.2)

## 2014-11-13 MED ORDER — POTASSIUM CHLORIDE CRYS ER 20 MEQ PO TBCR
40.0000 meq | EXTENDED_RELEASE_TABLET | Freq: Once | ORAL | Status: AC
  Administered 2014-11-13: 40 meq via ORAL
  Filled 2014-11-13: qty 2

## 2014-11-13 MED ORDER — SPIRONOLACTONE 12.5 MG HALF TABLET
12.5000 mg | ORAL_TABLET | Freq: Every day | ORAL | Status: DC
  Administered 2014-11-13: 12.5 mg via ORAL
  Filled 2014-11-13 (×2): qty 1

## 2014-11-13 MED ORDER — WARFARIN SODIUM 7.5 MG PO TABS
7.5000 mg | ORAL_TABLET | Freq: Once | ORAL | Status: AC
  Administered 2014-11-13: 7.5 mg via ORAL
  Filled 2014-11-13: qty 1

## 2014-11-13 MED ORDER — LEVOFLOXACIN 25 MG/ML PO SOLN: 750.0000 mg | Freq: Every day | ORAL | Status: DC

## 2014-11-13 MED ORDER — POTASSIUM CHLORIDE CRYS ER 20 MEQ PO TBCR
40.0000 meq | EXTENDED_RELEASE_TABLET | Freq: Once | ORAL | Status: AC
  Administered 2014-11-13: 40 meq via ORAL
  Filled 2014-11-13: qty 4

## 2014-11-13 MED ORDER — MAGNESIUM SULFATE 2 GM/50ML IV SOLN
2.0000 g | Freq: Once | INTRAVENOUS | Status: AC
  Administered 2014-11-13: 2 g via INTRAVENOUS
  Filled 2014-11-13: qty 50

## 2014-11-13 MED ORDER — HEPARIN (PORCINE) IN NACL 100-0.45 UNIT/ML-% IJ SOLN
1750.0000 [IU]/h | INTRAMUSCULAR | Status: DC
  Administered 2014-11-13 – 2014-11-15 (×3): 1900 [IU]/h via INTRAVENOUS
  Administered 2014-11-15: 1750 [IU]/h via INTRAVENOUS
  Filled 2014-11-13 (×10): qty 250

## 2014-11-13 MED ORDER — LEVOFLOXACIN 750 MG PO TABS
750.0000 mg | ORAL_TABLET | Freq: Every day | ORAL | Status: DC
  Administered 2014-11-13 – 2014-11-15 (×4): 750 mg via ORAL
  Filled 2014-11-13 (×3): qty 1

## 2014-11-13 MED ORDER — FUROSEMIDE 40 MG PO TABS
40.0000 mg | ORAL_TABLET | Freq: Every day | ORAL | Status: DC
  Administered 2014-11-13 – 2014-11-15 (×3): 40 mg via ORAL
  Filled 2014-11-13 (×4): qty 1

## 2014-11-13 MED ORDER — WARFARIN - PHARMACIST DOSING INPATIENT
Freq: Every day | Status: DC
  Administered 2014-11-13 – 2014-11-14 (×2)

## 2014-11-13 MED ORDER — ISOSORB DINITRATE-HYDRALAZINE 20-37.5 MG PO TABS
0.5000 | ORAL_TABLET | Freq: Three times a day (TID) | ORAL | Status: DC
  Administered 2014-11-13 (×3): 0.5 via ORAL
  Filled 2014-11-13 (×3): qty 1
  Filled 2014-11-13 (×3): qty 0.5

## 2014-11-13 MED ORDER — DIGOXIN 250 MCG PO TABS
0.2500 mg | ORAL_TABLET | Freq: Every day | ORAL | Status: DC
  Administered 2014-11-13 – 2014-11-17 (×5): 0.25 mg via ORAL
  Filled 2014-11-13 (×5): qty 1

## 2014-11-13 NOTE — Progress Notes (Signed)
S: No further hemoptysis O:BP 112/77 mmHg  Pulse 101  Temp(Src) 98.1 F (36.7 C) (Oral)  Resp 18  Ht  (1.753 m)  Wt 82.2 kg (181 lb 3.5 oz)  BMI 26.75 kg/m2  SpO2 96%  Intake/Output Summary (Last 24 hours) at 11/13/14 0801 Last data filed at 11/13/14 0400  Gross per 24 hour  Intake 1595.42 ml  Output   6020 ml  Net -4424.58 ml   Weight change: -4.6 kg (-10 lb 2.3 oz) OZH:YQMVH and alert CVS:RRR Resp:crackles rt base are less Abd:+ BS NTND Ext: No  edema NEURO:CNI Ox3 no asterixis   . antiseptic oral rinse  7 mL Mouth Rinse BID  . cefTAZidime (FORTAZ)  IV  2 g Intravenous Q12H  . Chlorhexidine Gluconate Cloth  6 each Topical Q0600  . folic acid  1 mg Oral Daily  . furosemide  80 mg Intravenous BID  . magnesium sulfate 1 - 4 g bolus IVPB  2 g Intravenous Once  . multivitamin with minerals  1 tablet Oral Daily  . mupirocin ointment  1 application Nasal BID  . pantoprazole  40 mg Oral Daily  . potassium chloride  40 mEq Oral BID  . sodium chloride  3 mL Intravenous Q12H  . thiamine  100 mg Oral Daily   Or  . thiamine  100 mg Intravenous Daily   Dg Chest Port 1 View  11/12/2014   CLINICAL DATA:  Respiratory failure  EXAM: PORTABLE CHEST - 1 VIEW  COMPARISON:  11/11/2014  FINDINGS: Persistent airspace disease in the right lower lobe. Trace right pleural effusion. Hazy left lower lobe airspace disease. No pneumothorax. Stable cardiomediastinal silhouette. Right jugular central venous catheter with the tip projecting over the SVC.  No acute osseous abnormality.  IMPRESSION: 1. Persistent right lower lobe pneumonia. No significant interval change. Followup PA and lateral chest X-ray is recommended in 3-4 weeks following trial of antibiotic therapy to ensure resolution and exclude underlying malignancy. 2. Hazy left lower lobe airspace disease which may reflect atelectasis versus developing pneumonia.   Electronically Signed   By: Elige Ko   On: 11/12/2014 08:19   Dg Chest  Port 1 View  11/11/2014   CLINICAL DATA:  Central line insertion.  EXAM: PORTABLE CHEST - 1 VIEW  COMPARISON:  Two-view chest x-ray 11/09/2014. CT of the chest 10/12/2014.  FINDINGS: The heart is enlarged. Any right IJ line is been placed. The tip projects over the right SVC, above the cavoatrial junction.  Progressive airspace consolidation is present in the right lower lobe. A right pleural effusion is suspected. The left lung is clear. Lung volumes are low.  IMPRESSION: 1. Interval placement of right IJ line without complication. The tip is in the mid SVC. 2. Stable cardiomegaly. 3. Progressive airspace consolidation in the right lower lobe.   Electronically Signed   By: Marin Roberts M.D.   On: 11/11/2014 20:53   BMET    Component Value Date/Time   NA 136 11/13/2014 0321   K 3.2* 11/13/2014 0321   CL 95* 11/13/2014 0321   CO2 32 11/13/2014 0321   GLUCOSE 111* 11/13/2014 0321   BUN 23* 11/13/2014 0321   CREATININE 1.22 11/13/2014 0321   CREATININE 1.39* 11/08/2014 1236   CALCIUM 8.1* 11/13/2014 0321   GFRNONAA >60 11/13/2014 0321   GFRAA >60 11/13/2014 0321   CBC    Component Value Date/Time   WBC 10.4 11/13/2014 0321   RBC 3.88* 11/13/2014 0321   HGB 11.5*  11/13/2014 0321   HCT 32.5* 11/13/2014 0321   PLT 294 11/13/2014 0321   MCV 83.8 11/13/2014 0321   MCH 29.6 11/13/2014 0321   MCHC 35.4 11/13/2014 0321   RDW 15.1 11/13/2014 0321   LYMPHSABS 1.4 11/09/2014 0820   MONOABS 0.8 11/09/2014 0820   EOSABS 0.0 11/09/2014 0820   BASOSABS 0.0 11/09/2014 0820     Assessment:  1. Acute on subacute/CKD most likely due to poor CO.  Renal fx improved 2. Severe cardiomyopathy, EF 5-10% 3. Elevated LFT's Hep and HIV neg 4. Hypokalemia on K supps  Plan: 1. Renal fx has improved.  Will sign off.  Call if further renal issues  Ami Thornsberry T

## 2014-11-13 NOTE — Progress Notes (Addendum)
Advanced Heart Failure Rounding Note  Primary Cardiologist:   Reason for referral: A/C systolic HF EF 5-10%  Subjective:    Robert Booth is a 44 y.o. male with hx of systolic HF with NICM secondary to prolonged ETOH abuse, EF 5-10% by Echo 11/11/14, and CKD stage 2.  He presented to [facility] with hemoptysis Found to have RLL PNA with procalcitonin > 8.0  Central line placed 8/12 with co-ox 27% and CVP 24 c/w cardiogenic shock. Started on milrinone and levophed. Lasix 80 IV bid. Diuresed well.  Diuresed 10 pounds last night. Levophed weaned off yesterday.  Co-ox now 65%. Feels much better. Cough resolved. Renal function normalized.    Echo 10/13/14 EF 20% Echo 11/11/14 EF 5-10%   Objective:   Weight Range: 82.2 kg (181 lb 3.5 oz) Body mass index is 26.75 kg/(m^2).   Vital Signs:   Temp:  [97.9 F (36.6 C)-98.1 F (36.7 C)] 98.1 F (36.7 C) (08/14 0749) Pulse Rate:  [93-126] 126 (08/14 0800) Resp:  [13-23] 20 (08/14 0800) BP: (94-137)/(61-91) 96/78 mmHg (08/14 0800) SpO2:  [92 %-100 %] 99 % (08/14 0800) Weight:  [82.2 kg (181 lb 3.5 oz)] 82.2 kg (181 lb 3.5 oz) (08/14 0331) Last BM Date: 11/12/14  Weight change: Filed Weights   11/11/14 0427 11/12/14 0400 11/13/14 0331  Weight: 88.9 kg (195 lb 15.8 oz) 86.8 kg (191 lb 5.8 oz) 82.2 kg (181 lb 3.5 oz)    Intake/Output:   Intake/Output Summary (Last 24 hours) at 11/13/14 0909 Last data filed at 11/13/14 0845  Gross per 24 hour  Intake 1516.87 ml  Output   5695 ml  Net -4178.13 ml     Physical Exam: General: Sitting up in bed. Much more alert Neuro: Alert and oriented X 3. Moves all extremities spontaneously. Psych: Normal affect. HEENT: Normal Neck: RIJ TLC. Supple without bruits. JVP 10 with prominent CV waves Lungs: Resp regular and unlabored, CTA. Heart: PMI laterally displaced RRR s3, s4, or murmurs. +S3 Abdomen: Soft, non-tender, non-distended, BS + x 4.  Extremities: No clubbing, cyanosis.  DP/PT/Radials 2+ and equal bilaterally. 1-2+ peripheral edema  Telemetry: Sinus 90s  Labs: CBC  Recent Labs  11/12/14 0425 11/13/14 0321  WBC 14.7* 10.4  HGB 10.9* 11.5*  HCT 30.8* 32.5*  MCV 82.4 83.8  PLT 307 294   Basic Metabolic Panel  Recent Labs  11/12/14 0425 11/12/14 1410 11/13/14 0321  NA 131*  --  136  K 3.0*  --  3.2*  CL 94*  --  95*  CO2 27  --  32  GLUCOSE 107*  --  111*  BUN 40*  --  23*  CALCIUM 8.1*  --  8.1*  MG  --  1.8  --   PHOS 2.9  --   --    Liver Function Tests  Recent Labs  11/11/14 0223 11/12/14 0425  AST 2616* 1751*  ALT 1251* 1213*  ALKPHOS 73 71  BILITOT 3.5* 5.3*  PROT 6.4* 6.2*  ALBUMIN 2.8* 2.6*   No results for input(s): LIPASE, AMYLASE in the last 72 hours. Cardiac Enzymes  Recent Labs  11/10/14 1540 11/10/14 2057  TROPONINI 0.06* 0.08*    BNP: BNP (last 3 results)  Recent Labs  10/12/14 1710 10/17/14 0438 11/09/14 0820  BNP 879.0* 1091.1* 1689.6*    ProBNP (last 3 results) No results for input(s): PROBNP in the last 8760 hours.   D-Dimer No results for input(s): DDIMER in the last 72 hours. Hemoglobin A1C  No results for input(s): HGBA1C in the last 72 hours. Fasting Lipid Panel No results for input(s): CHOL, HDL, LDLCALC, TRIG, CHOLHDL, LDLDIRECT in the last 72 hours. Thyroid Function Tests No results for input(s): TSH, T4TOTAL, T3FREE, THYROIDAB in the last 72 hours.  Invalid input(s): FREET3  Other results:     Imaging/Studies:  Dg Chest Port 1 View  11/12/2014   CLINICAL DATA:  Respiratory failure  EXAM: PORTABLE CHEST - 1 VIEW  COMPARISON:  11/11/2014  FINDINGS: Persistent airspace disease in the right lower lobe. Trace right pleural effusion. Hazy left lower lobe airspace disease. No pneumothorax. Stable cardiomediastinal silhouette. Right jugular central venous catheter with the tip projecting over the SVC.  No acute osseous abnormality.  IMPRESSION: 1. Persistent right lower lobe  pneumonia. No significant interval change. Followup PA and lateral chest X-ray is recommended in 3-4 weeks following trial of antibiotic therapy to ensure resolution and exclude underlying malignancy. 2. Hazy left lower lobe airspace disease which may reflect atelectasis versus developing pneumonia.   Electronically Signed   By: Elige Ko   On: 11/12/2014 08:19   Dg Chest Port 1 View  11/11/2014   CLINICAL DATA:  Central line insertion.  EXAM: PORTABLE CHEST - 1 VIEW  COMPARISON:  Two-view chest x-ray 11/09/2014. CT of the chest 10/12/2014.  FINDINGS: The heart is enlarged. Any right IJ line is been placed. The tip projects over the right SVC, above the cavoatrial junction.  Progressive airspace consolidation is present in the right lower lobe. A right pleural effusion is suspected. The left lung is clear. Lung volumes are low.  IMPRESSION: 1. Interval placement of right IJ line without complication. The tip is in the mid SVC. 2. Stable cardiomegaly. 3. Progressive airspace consolidation in the right lower lobe.   Electronically Signed   By: Marin Roberts M.D.   On: 11/11/2014 20:53    Latest Echo  Latest Cath   Medications:     Scheduled Medications: . antiseptic oral rinse  7 mL Mouth Rinse BID  . cefTAZidime (FORTAZ)  IV  2 g Intravenous Q12H  . Chlorhexidine Gluconate Cloth  6 each Topical Q0600  . folic acid  1 mg Oral Daily  . furosemide  80 mg Intravenous BID  . multivitamin with minerals  1 tablet Oral Daily  . mupirocin ointment  1 application Nasal BID  . pantoprazole  40 mg Oral Daily  . potassium chloride  40 mEq Oral BID  . sodium chloride  3 mL Intravenous Q12H  . thiamine  100 mg Oral Daily   Or  . thiamine  100 mg Intravenous Daily    Infusions: . heparin 1,900 Units/hr (11/13/14 0641)  . milrinone 0.375 mcg/kg/min (11/13/14 0017)  . norepinephrine (LEVOPHED) Adult infusion Stopped (11/12/14 1417)    PRN Medications: benzonatate, metoCLOPramide (REGLAN)  injection, morphine injection, polyethylene glycol, traMADol   Assessment/Plan   1. Acute on chronic systolic CHF with NICM, -> cardiogenic shock 2. AKI on CKD stage 2 3. Elevated troponin 4. ETOH abuse quit February 2016 5. Acute renal failure 6. Shock liver 7. Acute respiratory failure 8. PNA with hemoptysis 9. NSVT 10. Hyponatremia/hypokalemia/hypomagnesemia 11. LV mural thrombus  He was admitted with profound cardiogenic shock in setting of severe ETOH cardiomyopathy (EF 10%) and RLL PNA. Now improved with inotropic support and diuresis. Renal function and LFTs improving. Will  continue milrinone. Change lasix to po. Start low-dose Bidil, digoxin and spiro. Antibiotics per primary team can likely switch to po Levaquin.  INR now < 2.0. Can resume coumadin.   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 May be transplant candidate if remain abstinent from ETOH. Will check blood type.    Bensimhon, Daniel,MD 9:09 AM Advanced Heart Failure Team Pager [phone] (M-F; 7a - 4p)  Please contact [facility] for night-coverage after hours (4p -7a ) and weekends on amion.

## 2014-11-13 NOTE — Progress Notes (Signed)
ANTICOAGULATION CONSULT NOTE - Follow Up Consult  Pharmacy Consult for Heparin and Coumadin Indication: hx LV thrombus, probable PE  Allergies  Allergen Reactions  . Celexa [Citalopram Hydrobromide] Other (See Comments)    DISORIENTED  . Lisinopril Cough    Patient Measurements: Height: 5\' 9"  (175.3 cm) Weight: 181 lb 3.5 oz (82.2 kg) IBW/kg (Calculated) : 70.7  Vital Signs: Temp: 97.9 F (36.6 C) (08/14 1207) Temp Source: Oral (08/14 1207) BP: 119/79 mmHg (08/14 1207) Pulse Rate: 109 (08/14 1207)  Labs:  Recent Labs  11/10/14 1540  11/10/14 2057 11/11/14 0223 11/12/14 0424 11/12/14 0425 11/13/14 0321 11/13/14 1300  HGB 12.9*  --   --  12.1*  --  10.9* 11.5*  --   HCT 37.0*  --   --  34.2*  --  30.8* 32.5*  --   PLT 293  --   --  308  --  307 294  --   LABPROT  --   --   --  32.8* 37.7*  --  22.0*  --   INR  --   --   --  3.29* 3.95*  --  1.94*  --   HEPARINUNFRC  --   < >  --  0.35 0.54  --   --  0.41  CREATININE  --   --   --  2.41*  --  2.17* 1.22  --   TROPONINI 0.06*  --  0.08*  --   --   --   --   --   < > = values in this interval not displayed.  Estimated Creatinine Clearance: 77.3 mL/min (by C-G formula based on Cr of 1.22).   Medications:  Heparin @ 1900 units/hr  Assessment: 43yom on coumadin pta for hx LV thrombus, admitted with hemoptysis and subtherapeutic INR. Heparin bridge was started. D-dimer came back elevated and VQ scan showed intermediate probability for PE. INR trended up to 3.95 yesterday (due to shock liver) and heparin was held. No reversal agents were given. INR back down to 1.9 and heparin resumed this morning. Heparin level is therapeutic. Coumadin to also resume tonight (last dose given was 8/9). Noted antibiotics were changed to levaquin so will watch for drug interaction. Hemoptysis resolved. CBC stable.  Home coumadin dose: 7.5mg  daily  Goal of Therapy:  Heparin level 0.3-0.7 INR 2-3 Monitor platelets by anticoagulation  protocol: Yes   Plan:  1) Continue heparin at 1900 units/hr 2) Coumadin 7.5mg  x 1 3) Daily heparin level, INR, CBC  Fredrik Rigger 11/13/2014,1:40 PM

## 2014-11-13 NOTE — Progress Notes (Signed)
ANTICOAGULATION NOTE  Pharmacy Consult for heparin  Indication: hx LV thrombus, probable PE  Allergies  Allergen Reactions  . Celexa [Citalopram Hydrobromide] Other (See Comments)    DISORIENTED  . Lisinopril Cough    Patient Measurements: Height: 5\' 9"  (175.3 cm) Weight: 181 lb 3.5 oz (82.2 kg) IBW/kg (Calculated) : 70.7   Vital Signs: Temp: 97.9 F (36.6 C) (08/14 0331) Temp Source: Oral (08/14 0331) BP: 101/71 mmHg (08/14 0331) Pulse Rate: 104 (08/14 0331)  Labs:  Recent Labs  11/10/14 0825  11/10/14 1540 11/10/14 1821 11/10/14 2057 11/11/14 0223 11/12/14 0424 11/12/14 0425 11/13/14 0321  HGB  --   < > 12.9*  --   --  12.1*  --  10.9* 11.5*  HCT  --   < > 37.0*  --   --  34.2*  --  30.8* 32.5*  PLT  --   < > 293  --   --  308  --  307 294  LABPROT  --   --   --   --   --  32.8* 37.7*  --  22.0*  INR  --   --   --   --   --  3.29* 3.95*  --  1.94*  HEPARINUNFRC  --   --   --  0.54  --  0.35 0.54  --   --   CREATININE  --   --   --   --   --  2.41*  --  2.17* 1.22  TROPONINI 0.07*  --  0.06*  --  0.08*  --   --   --   --   < > = values in this interval not displayed.  Estimated Creatinine Clearance: 77.3 mL/min (by C-G formula based on Cr of 1.22).  Medications: Heparin @ 1900 units/hr  Assessment: 43yom on coumadin pta for hx LV thrombus, admitted with hemoptysis and subtherapeutic INR. Heparin bridge was started. D-dimer came back elevated and VQ scan showed intermediate probability for PE. Heparin level is therapeutic this morning. Last dose of coumadin given was 8/9 however INR has since trended up 2.7>2.62>3.29>3.95, likely due to shock liver. Spoke to Dr. Gala Romney and ok to hold heparin until falls back below 2. Hemoptysis resolved. INR down to 1.94 today. CBC is stable.   Goal of Therapy:  Heparin level 0.3-0.7 units/ml Monitor platelets by anticoagulation protocol: Yes   Plan:  1) Resume heparin infusion at 1900 units/hr  2) F/u 6 hr HL 3)  Monitor daily HL, CBC and s/s of bleeding     Vinnie Level, PharmD., BCPS Clinical Pharmacist Pager [phone]

## 2014-11-13 NOTE — Progress Notes (Signed)
Family Medicine Teaching Service Daily Progress Note Intern Pager: [phone]  Patient name: Robert Booth Medical record number: [id number] Date of birth: 01/29/1971 Age: 44 y.o. Gender: male  Primary Care Provider: Lora Paula, MD Consultants: Cardiology, Pulmonology Code Status: FULL  Pt Overview and Major Events to Date:  - 7/13-7/19/16: Previous hospitalization for hemoptysis treated as HCAP. - 11/11/14: ECHO with LVEF 5-10% (decreased from 20% on 10/13/14) - 11/11/14: Transferred to Cardiac SDU/ICU - 11/11/14: Central line placed, inotrope started - 11/13/14: LE swelling resolved, negative for DVT via doppler   Assessment and Plan: Robert Booth is a 44 y.o. male presenting with cough, SOB, hemoptysis and pleuritic chest pain. PMH is significant for HTN, dilated cardiomyopathy (NICM, reduced EF), alcoholism, and anxiety. Suspect pulmonary embolism given tachycardia, SOB, subtherapeutic INR on admission on coumadin, known LV thrombus, and positive D-dimer. With AKI, also considering Goodpasture's/Wegener's. BNP elevated to 1689.6.Was 1091.1 at last admission. Acute on chronic CHF exacerbation with cardiogenic and hepatic shock. Central line in place for administration of inotropes and pressors.   # HFrEF: Now in cardiogenic shock. Weight down 4lb 10 oz from yesterday (8/12) after diuresis. Net negative 2.9 L. No crackles on exam. - Heart Failure team consulted and following, appreciate recommendations.  - Milrinone 0.375 mcg/kg/min, d/c norepinephrine   - Continue Magnesium sulfate drip  - Continue IV lasix 80 mg BID.  - K+ 3.2 this AM, replaced with KDUR  - Continue daily weights and strict I/Os - Trending carboxyhemoglobin  - Cards indicating may be transplant candidate if remain abstinent from ETOH. Will check blood type - Cardiology recs spironolactone on discharge.   # Hemoptysis: Per patient, resolved. V/Q scan intermediate risk. Hemoglobin decreased to 10.9 from 12.1 <  12.9. Negative ANA and ANCA titers.  - Hemoglobin stable. Continue to trend CBCs. - Pharmacy consulting for anticoag management. Continue heparin gtt with goal level of 0.3-0.7.  - Can restart coumadin today, per cards.  - Pulmonology consulted, appreciate recommendations. Low suspicion for mass. No need for bronchoscopy at this time.  # Hepatic Failure: MELD score of 35, AST 1290, ALT 697, INR increasing. LE dopplers showed pulsatile venous flow suggestive of right-sided heart pressure, concerning for hepatic congestion. RUQ U/S negative for ascites. Neg Hepatitis panel. Acetaminophen level normal.  -  PT/INR dropped to 1.94 today from 3.95  yesterday   # Chest Pain: Resolved right-sided chest pain. Troponins trended and peaked at 0.09.   # AKI: Resolved.  2.17 < 1.22 (1.22 in July) Suspect AKI 2/2 hypoperfusion in setting of cardiac shock. Urine output high on lasix. Salicylate level normal. Normal renal U/S. UA positive for granular casts, consistent with ATN. UDS positive for opiates (received during hospitalization) - Continue holding losartan. - Continue to monitor BMP  - Nephrology signed off  - AntiGBM AB pending.   # Increased INR: 1.94 <  3.95 < 3.29 < 2.62 < 1.72. Elevated LFTs. Denies any hemoptysis at this point  - Continue heparin gtt given high suspicion for PE and known left ventricular thrombus.  - Heparin level within target range of 0.3-0.7 at 0.54 - Continue, unless hemoptysis worsens.   # Nausea: Resolved. - Metoclopramide ordered q6h prn. Limit use given QTc>500.  # HCAP: Patient afebrile. CXR with new nodular consolidation. WBC decreasing: 14.7 < 15.8 < 18.3. Procalcitionin decreasing: 8.07 < 8.41 < 8.33. Clinical picture most consistent with CHF exacerbation rather than infection. Not currently meeting SIRS criteria. Blood cx NGTD. Vancomycin (2 days) -  HR  126, tachy, however WBC 10.4 this AM - - Continue IV ceftazidime - consider PO Levaquin today  - Following  procalcitonin - 8.07 on 8/13. Will recheck on  8/15.  #HTN: Continues to be normotensive - Continue home metoprolol.   # H/o Anxiety: not currently medically treated  # Alcohol Abuse: Denies ETOH use (states last drink was in February) - CIWA protocol.   FEN/GI: heart healthy diet  PPx: protonix 40 mg daily  Disposition: Admitted to the [facility] Service on telemetry.   Subjective:  Patient doing well this morning. He states that overall he feels that he is improving. He denies any SOB, fevers, chills, chest pain. He state that the swelling in his leg has dissipated.     Objective: Temp:  [97.4 F (36.3 C)-98 F (36.7 C)] 97.9 F (36.6 C) (08/14 0331) Pulse Rate:  [88-107] 104 (08/14 0331) Resp:  [16-23] 18 (08/14 0331) BP: (94-137)/(61-91) 101/71 mmHg (08/14 0331) SpO2:  [95 %-100 %] 97 % (08/14 0331) Weight:  [181 lb 3.5 oz (82.2 kg)] 181 lb 3.5 oz (82.2 kg) (08/14 0331) Physical Exam: General: Awake, sitting at edge of bed. Nasal canula in place (2L)  Cardiovascular: Tachycardic. RRR, no m/r/g  Respiratory: CTAB, no crackles  Abdomen: Soft, non-tender, nondistended, positive BS.  Extremities: No pitting edema this AM. DP pulses 2+ bilaterally.   Neuro: AOx3. Follows commands. Asks appropriate questions.  Skin: WWP.    Laboratory:  Recent Labs Lab 11/11/14 0223 11/12/14 0425 11/13/14 0321  WBC 15.8* 14.7* 10.4  HGB 12.1* 10.9* 11.5*  HCT 34.2* 30.8* 32.5*  PLT 308 307 294    Recent Labs Lab 11/10/14 1540 11/11/14 0223 11/12/14 0425 11/13/14 0321  NA  --  132* 131* 136  K  --  4.5 3.0* 3.2*  CL  --  95* 94* 95*  CO2  --  25 27 32  BUN  --  43* 40* 23*  CREATININE  --  2.41* 2.17* 1.22  CALCIUM  --  8.8* 8.1* 8.1*  PROT 7.1 6.4* 6.2*  --   BILITOT 3.3* 3.5* 5.3*  --   ALKPHOS 75 73 71  --   ALT 697* 1251* 1213*  --   AST 1290* 2616* 1751*  --   GLUCOSE  --  95 107* 111*    Imaging/Diagnostic Tests:  Dg Chest 2 View  11/09/2014    CLINICAL DATA:  Recent pneumonia  EXAM: CHEST  2 VIEW  COMPARISON:  10/13/2014  FINDINGS: Borderline cardiomegaly. There is new nodular consolidation right perihilar region. Persistent infiltrate/ consolidation right lower lobe. Findings suspicious for recurrent multifocal pneumonia. No pulmonary edema. Follow-up to resolution after appropriate treatment recommended.  IMPRESSION: There is new nodular consolidation right perihilar region. Persistent infiltrate/ consolidation right lower lobe. Findings suspicious for recurrent multifocal pneumonia. No pulmonary edema. Follow-up to resolution after appropriate treatment recommended.   Electronically Signed   By: Natasha Mead M.D.   On: 11/09/2014 08:08   US Renal  11/10/2014   CLINICAL DATA:  Acute kidney injury  EXAM: RENAL / URINARY TRACT ULTRASOUND COMPLETE  COMPARISON:  None.  FINDINGS: Right Kidney:  Length: 11.7 cm. Echogenicity within normal limits. No mass or hydronephrosis visualized.  Left Kidney:  Length: 13.1 cm. Echogenicity within normal limits. No mass or hydronephrosis visualized.  Bladder:  Bladder is decompressed and therefore not evaluated.  IMPRESSION: Kidneys appear normal.  Bladder not evaluated.   Electronically Signed   By: Edgar Frisk.D.  On: 11/10/2014 16:54   Nm Pulmonary Perf And Vent  11/10/2014   CLINICAL DATA:  Shortness of breath, chest pain, hemoptysis, history hypertension, non ischemic cardiomyopathy, former smoker  EXAM: NUCLEAR MEDICINE VENTILATION - PERFUSION LUNG SCAN  TECHNIQUE: Ventilation images were obtained in multiple projections using inhaled aerosol Tc-38m DTPA. Perfusion images were obtained in multiple projections after intravenous injection of Tc-16m MAA.  RADIOPHARMACEUTICALS:  39.0 mCi Technetium-110m DTPA aerosol inhalation and 5.9 mCi Technetium-54m MAA IV  COMPARISON:  None  Correlation: Chest radiograph 11/09/2014  FINDINGS: Ventilation: Impaired ventilation identified in RIGHT middle and RIGHT lower  lobes. RIGHT lower lobe absent perfusion corresponds to area of radiographic abnormality on preceding chest radiograph and recent CT. No abnormalities at RIGHT middle lobe on recent chest radiograph. Heart appears enlarged.  Perfusion: Matching diminished perfusion involving the RIGHT middle and RIGHT lower lobes. No additional areas of segmental or subsegmental perfusion decrease. Heart appears enlarged.  Chest radiograph: Significant RIGHT lower lobe infiltrate. No definite RIGHT middle lobe infiltrate.  IMPRESSION: Matching decreased ventilation, decreased perfusion, and radiographic abnormalities in RIGHT lower lobe.  Matching decreased subsegmental ventilation and perfusion abnormalities in RIGHT middle lobe with clear chest radiograph.  No perfusion abnormalities of the LEFT lung.  Findings represent an intermediate probability of pulmonary embolism.   Electronically Signed   By: Ulyses Southward M.D.   On: 11/10/2014 15:37   Dg Chest Port 1 View  11/12/2014   CLINICAL DATA:  Respiratory failure  EXAM: PORTABLE CHEST - 1 VIEW  COMPARISON:  11/11/2014  FINDINGS: Persistent airspace disease in the right lower lobe. Trace right pleural effusion. Hazy left lower lobe airspace disease. No pneumothorax. Stable cardiomediastinal silhouette. Right jugular central venous catheter with the tip projecting over the SVC.  No acute osseous abnormality.  IMPRESSION: 1. Persistent right lower lobe pneumonia. No significant interval change. Followup PA and lateral chest X-ray is recommended in 3-4 weeks following trial of antibiotic therapy to ensure resolution and exclude underlying malignancy. 2. Hazy left lower lobe airspace disease which may reflect atelectasis versus developing pneumonia.   Electronically Signed   By: Elige Ko   On: 11/12/2014 08:19   Dg Chest Port 1 View  11/11/2014   CLINICAL DATA:  Central line insertion.  EXAM: PORTABLE CHEST - 1 VIEW  COMPARISON:  Two-view chest x-ray 11/09/2014. CT of the chest  10/12/2014.  FINDINGS: The heart is enlarged. Any right IJ line is been placed. The tip projects over the right SVC, above the cavoatrial junction.  Progressive airspace consolidation is present in the right lower lobe. A right pleural effusion is suspected. The left lung is clear. Lung volumes are low.  IMPRESSION: 1. Interval placement of right IJ line without complication. The tip is in the mid SVC. 2. Stable cardiomegaly. 3. Progressive airspace consolidation in the right lower lobe.   Electronically Signed   By: Marin Roberts M.D.   On: 11/11/2014 20:53   US Abdomen Limited Ruq  11/11/2014   CLINICAL DATA:  Elevated liver function tests.  EXAM: US ABDOMEN LIMITED - RIGHT UPPER QUADRANT  COMPARISON:  None.  FINDINGS: Gallbladder:  No gallstones are identified. The gallbladder is incompletely distended. The wall is thickened at 0.5 cm. No pericholecystic fluid. Sonographer reports negative Murphy's sign.  Common bile duct:  Diameter: 0.4 cm.  Liver:  No focal lesion identified. The liver demonstrates mildly increased echogenicity. The portal vein is patent.  IMPRESSION: Gallbladder wall thickening without stones or pericholecystic fluid may be due  to underdistention or hypoproteinemia. No ascites is identified.  Mildly increased echogenicity of the liver consistent with fatty infiltration.   Electronically Signed   By: Drusilla Kanner M.D.   On: 11/11/2014 11:11    Liliana Brentlinger Mayra Reel, MD 11/13/2014, 7:02 AM PGY-1, [facility] Family Medicine FPTS Intern pager: [phone], text pages welcome

## 2014-11-14 LAB — PROTEIN ELECTROPHORESIS, SERUM
A/G Ratio: 0.8 (ref 0.7–1.7)
Albumin ELP: 2.6 g/dL — ABNORMAL LOW (ref 2.9–4.4)
Alpha-1-Globulin: 0.4 g/dL (ref 0.0–0.4)
Alpha-2-Globulin: 0.7 g/dL (ref 0.4–1.0)
BETA GLOBULIN: 1.1 g/dL (ref 0.7–1.3)
GAMMA GLOBULIN: 1 g/dL (ref 0.4–1.8)
Globulin, Total: 3.2 g/dL (ref 2.2–3.9)
M-SPIKE, %: 0.3 g/dL — AB
TOTAL PROTEIN ELP: 5.8 g/dL — AB (ref 6.0–8.5)

## 2014-11-14 LAB — CARBOXYHEMOGLOBIN
Carboxyhemoglobin: 1.9 % — ABNORMAL HIGH (ref 0.5–1.5)
Methemoglobin: 0.8 % (ref 0.0–1.5)
O2 SAT: 65.5 %
TOTAL HEMOGLOBIN: 11 g/dL — AB (ref 13.5–18.0)

## 2014-11-14 LAB — HEPATIC FUNCTION PANEL
ALK PHOS: 76 U/L (ref 38–126)
ALT: 640 U/L — AB (ref 17–63)
AST: 318 U/L — AB (ref 15–41)
Albumin: 2.4 g/dL — ABNORMAL LOW (ref 3.5–5.0)
BILIRUBIN DIRECT: 0.7 mg/dL — AB (ref 0.1–0.5)
Indirect Bilirubin: 1.6 mg/dL — ABNORMAL HIGH (ref 0.3–0.9)
Total Bilirubin: 2.3 mg/dL — ABNORMAL HIGH (ref 0.3–1.2)
Total Protein: 6.3 g/dL — ABNORMAL LOW (ref 6.5–8.1)

## 2014-11-14 LAB — BASIC METABOLIC PANEL
Anion gap: 8 (ref 5–15)
BUN: 12 mg/dL (ref 6–20)
CHLORIDE: 100 mmol/L — AB (ref 101–111)
CO2: 28 mmol/L (ref 22–32)
CREATININE: 1.05 mg/dL (ref 0.61–1.24)
Calcium: 8.3 mg/dL — ABNORMAL LOW (ref 8.9–10.3)
GFR calc non Af Amer: >60 mL/min (ref >60)
GLUCOSE: 95 mg/dL (ref 65–99)
Potassium: 4.4 mmol/L (ref 3.5–5.1)
Sodium: 136 mmol/L (ref 135–145)

## 2014-11-14 LAB — CBC
HCT: 32.5 % — ABNORMAL LOW (ref 39.0–52.0)
Hemoglobin: 11.1 g/dL — ABNORMAL LOW (ref 13.0–17.0)
MCH: 29.1 pg (ref 26.0–34.0)
MCHC: 34.2 g/dL (ref 30.0–36.0)
MCV: 85.1 fL (ref 78.0–100.0)
PLATELETS: 326 10*3/uL (ref 150–400)
RBC: 3.82 MIL/uL — ABNORMAL LOW (ref 4.22–5.81)
RDW: 15.3 % (ref 11.5–15.5)
WBC: 13 10*3/uL — AB (ref 4.0–10.5)

## 2014-11-14 LAB — CULTURE, BLOOD (ROUTINE X 2)
CULTURE: NO GROWTH
Culture: NO GROWTH

## 2014-11-14 LAB — PROTIME-INR
INR: 1.37 (ref 0.00–1.49)
PROTHROMBIN TIME: 17 s — AB (ref 11.6–15.2)

## 2014-11-14 LAB — HEPARIN LEVEL (UNFRACTIONATED): Heparin Unfractionated: 0.46 IU/mL (ref 0.30–0.70)

## 2014-11-14 LAB — MAGNESIUM: MAGNESIUM: 1.7 mg/dL (ref 1.7–2.4)

## 2014-11-14 LAB — GLOMERULAR BASEMENT MEMBRANE ANTIBODIES: GBM Ab: 9 units (ref 0–20)

## 2014-11-14 MED ORDER — ISOSORB DINITRATE-HYDRALAZINE 20-37.5 MG PO TABS
1.0000 | ORAL_TABLET | Freq: Three times a day (TID) | ORAL | Status: DC
  Administered 2014-11-14 (×3): 1 via ORAL
  Filled 2014-11-14 (×5): qty 1

## 2014-11-14 MED ORDER — WARFARIN SODIUM 7.5 MG PO TABS
7.5000 mg | ORAL_TABLET | Freq: Once | ORAL | Status: AC
  Administered 2014-11-14: 7.5 mg via ORAL
  Filled 2014-11-14: qty 1

## 2014-11-14 MED ORDER — POTASSIUM CHLORIDE CRYS ER 10 MEQ PO TBCR
10.0000 meq | EXTENDED_RELEASE_TABLET | Freq: Two times a day (BID) | ORAL | Status: DC
  Administered 2014-11-14: 10 meq via ORAL
  Filled 2014-11-14: qty 1

## 2014-11-14 MED ORDER — SPIRONOLACTONE 25 MG PO TABS
25.0000 mg | ORAL_TABLET | Freq: Every day | ORAL | Status: DC
Start: 2014-11-14 — End: 2014-11-17
  Administered 2014-11-14 – 2014-11-17 (×4): 25 mg via ORAL
  Filled 2014-11-14 (×3): qty 1

## 2014-11-14 MED ORDER — MAGNESIUM SULFATE 2 GM/50ML IV SOLN
2.0000 g | Freq: Once | INTRAVENOUS | Status: AC
  Administered 2014-11-14: 2 g via INTRAVENOUS
  Filled 2014-11-14: qty 50

## 2014-11-14 NOTE — Progress Notes (Signed)
Advanced Heart Failure Rounding Note  Primary Cardiologist:   Reason for referral: A/C systolic HF EF 5-10%  Subjective:    Robert Booth is a 44 y.o. male with hx of systolic HF with NICM secondary to prolonged ETOH abuse, EF 5-10% by Echo 11/11/14, and CKD stage 2.  He presented to [facility] with hemoptysis Found to have RLL PNA with procalcitonin > 8.0  Central line placed 8/12 with co-ox 27% and CVP 24 c/w cardiogenic shock. Started on milrinone and levophed. Lasix 80 IV bid. Diuresed well.  Only out 0.350 L but down 2 lbs. Levophed weaned off 11/12/14. Co-ox now 66%. Cough resolved. Renal function continues to improve, now in normal range. CVP 3-4 this morning per nurse.  No waveform currently.   Echo 10/13/14 EF 20% Echo 11/11/14 EF 5-10%   Objective:   Weight Range: 179 lb 12.8 oz (81.557 kg) Body mass index is 26.54 kg/(m^2).   Vital Signs:   Temp:  [97.9 F (36.6 C)-98.3 F (36.8 C)] 97.9 F (36.6 C) (08/15 0314) Pulse Rate:  [101-126] 111 (08/15 0314) Resp:  [16-21] 20 (08/15 0314) BP: (96-122)/(67-79) 111/67 mmHg (08/15 0314) SpO2:  [95 %-100 %] 97 % (08/15 0314) Weight:  [179 lb 12.8 oz (81.557 kg)] 179 lb 12.8 oz (81.557 kg) (08/15 0314) Last BM Date: 11/13/14  Weight change: Filed Weights   11/12/14 0400 11/13/14 0331 11/14/14 0314  Weight: 191 lb 5.8 oz (86.8 kg) 181 lb 3.5 oz (82.2 kg) 179 lb 12.8 oz (81.557 kg)    Intake/Output:   Intake/Output Summary (Last 24 hours) at 11/14/14 0728 Last data filed at 11/14/14 0700  Gross per 24 hour  Intake   1229 ml  Output   1576 ml  Net   -347 ml     Physical Exam: General: seated in chair. Neuro: Alert and oriented X 3. Moves all extremities spontaneously. Psych: Normal affect. HEENT: Normal Neck: RIJ TLC. Supple without bruits. JVP 7-8 with prominent CV waves Lungs: Resp regular and unlabored, CTA. Heart: PMI laterally displaced RRR s3, s4, or murmurs. +S3 Abdomen: Soft, non-tender,  non-distended, BS + x 4.  Extremities: No clubbing, cyanosis. DP/PT/Radials 2+ and equal bilaterally. 1-2+ peripheral edema  Telemetry: Sinus tach 110-120s  Labs: CBC  Recent Labs  11/13/14 0321 11/14/14 0415  WBC 10.4 13.0*  HGB 11.5* 11.1*  HCT 32.5* 32.5*  MCV 83.8 85.1  PLT 294 326   Basic Metabolic Panel  Recent Labs  11/12/14 0425 11/12/14 1410 11/13/14 0321 11/14/14 0415  NA 131*  --  136 136  K 3.0*  --  3.2* 4.4  CL 94*  --  95* 100*  CO2 27  --  32 28  GLUCOSE 107*  --  111* 95  BUN 40*  --  23* 12  CALCIUM 8.1*  --  8.1* 8.3*  MG  --  1.8  --  1.7  PHOS 2.9  --   --   --    Liver Function Tests  Recent Labs  11/12/14 0425  AST 1751*  ALT 1213*  ALKPHOS 71  BILITOT 5.3*  PROT 6.2*  ALBUMIN 2.6*   No results for input(s): LIPASE, AMYLASE in the last 72 hours. Cardiac Enzymes No results for input(s): CKTOTAL, CKMB, CKMBINDEX, TROPONINI in the last 72 hours.  BNP: BNP (last 3 results)  Recent Labs  10/12/14 1710 10/17/14 0438 11/09/14 0820  BNP 879.0* 1091.1* 1689.6*    ProBNP (last 3 results) No results for input(s): PROBNP in  the last 8760 hours.   D-Dimer No results for input(s): DDIMER in the last 72 hours. Hemoglobin A1C No results for input(s): HGBA1C in the last 72 hours. Fasting Lipid Panel No results for input(s): CHOL, HDL, LDLCALC, TRIG, CHOLHDL, LDLDIRECT in the last 72 hours. Thyroid Function Tests No results for input(s): TSH, T4TOTAL, T3FREE, THYROIDAB in the last 72 hours.  Invalid input(s): FREET3  Other results:     Imaging/Studies:  No results found.  Latest Echo  Latest Cath   Medications:     Scheduled Medications: . antiseptic oral rinse  7 mL Mouth Rinse BID  . Chlorhexidine Gluconate Cloth  6 each Topical Q0600  . digoxin  0.25 mg Oral Daily  . folic acid  1 mg Oral Daily  . furosemide  40 mg Oral Daily  . isosorbide-hydrALAZINE  0.5 tablet Oral TID  . levofloxacin  750 mg Oral Daily   . multivitamin with minerals  1 tablet Oral Daily  . mupirocin ointment  1 application Nasal BID  . pantoprazole  40 mg Oral Daily  . sodium chloride  3 mL Intravenous Q12H  . spironolactone  12.5 mg Oral Daily  . thiamine  100 mg Oral Daily   Or  . thiamine  100 mg Intravenous Daily  . Warfarin - Pharmacist Dosing Inpatient   Does not apply q1800    Infusions: . heparin 1,900 Units/hr (11/13/14 2000)  . milrinone 0.375 mcg/kg/min (11/14/14 0430)    PRN Medications: benzonatate, metoCLOPramide (REGLAN) injection, morphine injection, polyethylene glycol, traMADol   Assessment/Plan   1. Acute on chronic systolic CHF with NICM, -> cardiogenic shock 2. AKI on CKD stage 2 3. Elevated troponin 4. ETOH abuse quit February 2016 5. Acute renal failure 6. Shock liver 7. Acute respiratory failure 8. PNA with hemoptysis 9. NSVT 10. Hyponatremia/hypokalemia/hypomagnesemia 11. LV mural thrombus  He was admitted with profound cardiogenic shock in setting of severe ETOH cardiomyopathy (EF 10%) and RLL PNA. Much improved with inotropic support and diuresis.  Renal function and LFTs greatly improved.  Continue milrinone. Will discuss timing of attempted wean with MD  Lasix po and digoxin started 11/13/14. Increase spiro to 25 and bidil to one tab TID. Antibiotics per primary team, now on levaquin.  INR 1.37. Coumadin resumed 8/14.   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 May be transplant candidate if continues abstinence from ETOH.  Type and screen in July = B+    Graciella Freer, PA-C  7:28 AM  Advanced Heart Failure Team Pager [phone] (M-F; 7a - 4p)  Please contact [facility] for night-coverage after hours (4p -7a ) and weekends on amion.  Patient seen and examined with Otilio Saber, PA-C. We discussed all aspects of the encounter. I agree with the assessment and plan as stated above.   Continues to improve. Now euvolemic. Continue to wean milrinone and  increase Bidil. Discussed role of advanced therapies (particualrly transplant) at length.   Robert Karnes,MD 10:51 PM

## 2014-11-14 NOTE — Progress Notes (Signed)
Family Medicine Teaching Service Daily Progress Note Intern Pager: [phone]  Patient name: Cyprian Gongaware Medical record number: [id number] Date of birth: 1970/11/22 Age: 44 y.o. Gender: male  Primary Care Provider: Lora Paula, MD Consultants: Cardiology, Pulmonology Code Status: FULL  Pt Overview and Major Events to Date:  - 7/13-7/19/16: Previous hospitalization for hemoptysis treated as HCAP. - 11/11/14: ECHO with LVEF 5-10% (decreased from 20% on 10/13/14) - 11/11/14: Transferred to Cardiac SDU/ICU - 11/11/14: Central line placed, milrinone and norepinephrine started - 11/13/14: LE swelling resolved, negative for DVT via doppler  - 11/13/14: norepinephrine d/c'ed   Assessment and Plan: Cong Hightower is a 44 y.o. male presenting with cough, SOB, hemoptysis and pleuritic chest pain. PMH is significant for HTN, dilated cardiomyopathy (NICM, reduced EF), alcoholism, and anxiety. Suspect pulmonary embolism given tachycardia, SOB, subtherapeutic INR on admission on coumadin, known LV thrombus, and positive D-dimer. With AKI, also considering Goodpasture's/Wegener's. BNP elevated to 1689.6.Was 1091.1 at last admission. Acute on chronic CHF exacerbation with cardiogenic and hepatic shock. Central line in place for administration of inotrope.    # HFrEF: Now in cardiogenic shock. Weight down 10 lb from admission after diuresis. Net negative 0.34 L. No crackles on exam. - Heart Failure team consulted and following, appreciate recommendations.  - Milrinone decreased to 0.25 mcg/kg/min - Lasix switched to 40 mg PO daily.  - Receiving daily magnesium sulfate.  - Low dose Bidil, digoxin and spironolactone started 8/14. - Continue daily weights and strict I/Os - Trending carboxyhemoglobin. O2 saturation 65.5.   - Cards indicating may be transplant candidate if remain abstinent from ETOH. Will check blood type - Cardiology recs spironolactone on discharge.   # Hemoptysis: Per patient, resolved.  V/Q scan intermediate risk. Hemoglobin 11.1 << 11.5 < 10.9 < 12.1 < 12.9. Negative ANA and ANCA titers.  - Hemoglobin stable.  - Pharmacy consulting for anticoag management. Continue heparin gtt with goal level of 0.3-0.7.  - Coumadin restarted 8/14, per cards. Continue to bridge.  - Pulmonology consulted, appreciate recommendations. Low suspicion for mass. No need for bronchoscopy at this time.  # Hepatic Failure: Improving, INR decreasing. AST 1290, ALT 697. LE dopplers showed pulsatile venous flow suggestive of right-sided heart pressure, concerning for hepatic congestion. RUQ U/S negative for ascites. Neg Hepatitis panel. Acetaminophen level normal.  - INR dropped to 1.37 today (11/14/14) from 1.94 yesterday  - Hepatic function panel ordered for today. Recalculate MELD score.   # AKI: Resolved. 1.05 << 2.17 < 1.22 (1.22 in July) Suspect AKI 2/2 hypoperfusion in setting of cardiac shock. Urine output high on lasix. Salicylate level normal. Normal renal U/S. UA positive for granular casts, consistent with ATN. UDS positive for opiates (received during hospitalization) - Consider restarting losartan. - Nephrology signed off  - AntiGBM AB pending.   # Increased INR: 1.94 <  3.95 < 3.29 < 2.62 < 1.72. Elevated LFTs. Denies any hemoptysis at this point  - Continue heparin gtt given high suspicion for PE and known left ventricular thrombus.  - Heparin level within target range of 0.3-0.7 at 0.54 - Continue, unless hemoptysis worsens.   # HCAP: Patient afebrile. CXR with new nodular consolidation. WBC 13.0 on 11/14/14, up from 10.4 8/14 but overall trending down. Procalcitionin decreasing: 8.07 < 8.41 < 8.33. Clinical picture most consistent with CHF exacerbation rather than infection. Not currently meeting SIRS criteria. Blood cx NGTD. Day 6 of antibiotics. Received vancomycin (2.5 days) along with ceftazidime (4.5 days), on day 2 of levaquin.   -  Continue Levaquin 750 mg daily. Tomorrow (11/15/14)  will be day 7 of 7 of antibiotics.  - Trend CBCs.   #Hx of LV thrombus - Continue anticoagulation.   #HTN: Continues to be normotensive - Continue home metoprolol.   # Chest Pain: Resolved right-sided chest pain. Troponins trended and peaked at 0.09.   # Nausea: Resolved.  # H/o Anxiety: not currently medically treated  # Alcohol Abuse: Denies ETOH use (states last drink was in February) - CIWA protocol.   FEN/GI: heart healthy diet  PPx: protonix 40 mg daily  Disposition: Admitted to the [facility] Service on telemetry.   Subjective:  Patient feeling very well this morning. He was able to sleep laying flat last night. He is off supplemental oxygen. He still has occasional coughing fits but reports no hemoptysis. He is eating well.   Objective: Temp:  [97.9 F (36.6 C)-98.3 F (36.8 C)] 97.9 F (36.6 C) (08/15 0314) Pulse Rate:  [101-126] 111 (08/15 0314) Resp:  [16-21] 20 (08/15 0314) BP: (96-122)/(67-79) 111/67 mmHg (08/15 0314) SpO2:  [95 %-100 %] 97 % (08/15 0314) Weight:  [179 lb 12.8 oz (81.557 kg)] 179 lb 12.8 oz (81.557 kg) (08/15 0314) Physical Exam: General: Awake, sitting up in chair.  Cardiovascular: Tachycardic. RRR, no m/r/g  Respiratory: CTAB, no crackles  Abdomen: Soft, non-tender, nondistended, positive BS.  Extremities: No pitting edema this AM. DP pulses 2+ bilaterally.   Neuro: AOx3. Asks appropriate questions.  Skin: WWP.    Laboratory:  Recent Labs Lab 11/12/14 0425 11/13/14 0321 11/14/14 0415  WBC 14.7* 10.4 13.0*  HGB 10.9* 11.5* 11.1*  HCT 30.8* 32.5* 32.5*  PLT 307 294 326    Recent Labs Lab 11/10/14 1540 11/11/14 0223 11/12/14 0425 11/13/14 0321 11/14/14 0415  NA  --  132* 131* 136 136  K  --  4.5 3.0* 3.2* 4.4  CL  --  95* 94* 95* 100*  CO2  --  25 27 32 28  BUN  --  43* 40* 23* 12  CREATININE  --  2.41* 2.17* 1.22 1.05  CALCIUM  --  8.8* 8.1* 8.1* 8.3*  PROT 7.1 6.4* 6.2*  --   --   BILITOT 3.3* 3.5*  5.3*  --   --   ALKPHOS 75 73 71  --   --   ALT 697* 1251* 1213*  --   --   AST 1290* 2616* 1751*  --   --   GLUCOSE  --  95 107* 111* 95    Imaging/Diagnostic Tests:  Dg Chest 2 View  11/09/2014   CLINICAL DATA:  Recent pneumonia  EXAM: CHEST  2 VIEW  COMPARISON:  10/13/2014  FINDINGS: Borderline cardiomegaly. There is new nodular consolidation right perihilar region. Persistent infiltrate/ consolidation right lower lobe. Findings suspicious for recurrent multifocal pneumonia. No pulmonary edema. Follow-up to resolution after appropriate treatment recommended.  IMPRESSION: There is new nodular consolidation right perihilar region. Persistent infiltrate/ consolidation right lower lobe. Findings suspicious for recurrent multifocal pneumonia. No pulmonary edema. Follow-up to resolution after appropriate treatment recommended.   Electronically Signed   By: Natasha Mead M.D.   On: 11/09/2014 08:08   US Renal  11/10/2014   CLINICAL DATA:  Acute kidney injury  EXAM: RENAL / URINARY TRACT ULTRASOUND COMPLETE  COMPARISON:  None.  FINDINGS: Right Kidney:  Length: 11.7 cm. Echogenicity within normal limits. No mass or hydronephrosis visualized.  Left Kidney:  Length: 13.1 cm. Echogenicity within normal limits. No mass or  hydronephrosis visualized.  Bladder:  Bladder is decompressed and therefore not evaluated.  IMPRESSION: Kidneys appear normal.  Bladder not evaluated.   Electronically Signed   By: Esperanza Heir M.D.   On: 11/10/2014 16:54   Nm Pulmonary Perf And Vent  11/10/2014   CLINICAL DATA:  Shortness of breath, chest pain, hemoptysis, history hypertension, non ischemic cardiomyopathy, former smoker  EXAM: NUCLEAR MEDICINE VENTILATION - PERFUSION LUNG SCAN  TECHNIQUE: Ventilation images were obtained in multiple projections using inhaled aerosol Tc-73m DTPA. Perfusion images were obtained in multiple projections after intravenous injection of Tc-25m MAA.  RADIOPHARMACEUTICALS:  39.0 mCi Technetium-43m  DTPA aerosol inhalation and 5.9 mCi Technetium-33m MAA IV  COMPARISON:  None  Correlation: Chest radiograph 11/09/2014  FINDINGS: Ventilation: Impaired ventilation identified in RIGHT middle and RIGHT lower lobes. RIGHT lower lobe absent perfusion corresponds to area of radiographic abnormality on preceding chest radiograph and recent CT. No abnormalities at RIGHT middle lobe on recent chest radiograph. Heart appears enlarged.  Perfusion: Matching diminished perfusion involving the RIGHT middle and RIGHT lower lobes. No additional areas of segmental or subsegmental perfusion decrease. Heart appears enlarged.  Chest radiograph: Significant RIGHT lower lobe infiltrate. No definite RIGHT middle lobe infiltrate.  IMPRESSION: Matching decreased ventilation, decreased perfusion, and radiographic abnormalities in RIGHT lower lobe.  Matching decreased subsegmental ventilation and perfusion abnormalities in RIGHT middle lobe with clear chest radiograph.  No perfusion abnormalities of the LEFT lung.  Findings represent an intermediate probability of pulmonary embolism.   Electronically Signed   By: Ulyses Southward M.D.   On: 11/10/2014 15:37   Dg Chest Port 1 View  11/12/2014   CLINICAL DATA:  Respiratory failure  EXAM: PORTABLE CHEST - 1 VIEW  COMPARISON:  11/11/2014  FINDINGS: Persistent airspace disease in the right lower lobe. Trace right pleural effusion. Hazy left lower lobe airspace disease. No pneumothorax. Stable cardiomediastinal silhouette. Right jugular central venous catheter with the tip projecting over the SVC.  No acute osseous abnormality.  IMPRESSION: 1. Persistent right lower lobe pneumonia. No significant interval change. Followup PA and lateral chest X-ray is recommended in 3-4 weeks following trial of antibiotic therapy to ensure resolution and exclude underlying malignancy. 2. Hazy left lower lobe airspace disease which may reflect atelectasis versus developing pneumonia.   Electronically Signed   By:  Elige Ko   On: 11/12/2014 08:19   Dg Chest Port 1 View  11/11/2014   CLINICAL DATA:  Central line insertion.  EXAM: PORTABLE CHEST - 1 VIEW  COMPARISON:  Two-view chest x-ray 11/09/2014. CT of the chest 10/12/2014.  FINDINGS: The heart is enlarged. Any right IJ line is been placed. The tip projects over the right SVC, above the cavoatrial junction.  Progressive airspace consolidation is present in the right lower lobe. A right pleural effusion is suspected. The left lung is clear. Lung volumes are low.  IMPRESSION: 1. Interval placement of right IJ line without complication. The tip is in the mid SVC. 2. Stable cardiomegaly. 3. Progressive airspace consolidation in the right lower lobe.   Electronically Signed   By: Marin Roberts M.D.   On: 11/11/2014 20:53   US Abdomen Limited Ruq  11/11/2014   CLINICAL DATA:  Elevated liver function tests.  EXAM: US ABDOMEN LIMITED - RIGHT UPPER QUADRANT  COMPARISON:  None.  FINDINGS: Gallbladder:  No gallstones are identified. The gallbladder is incompletely distended. The wall is thickened at 0.5 cm. No pericholecystic fluid. Sonographer reports negative Murphy's sign.  Common bile duct:  Diameter: 0.4 cm.  Liver:  No focal lesion identified. The liver demonstrates mildly increased echogenicity. The portal vein is patent.  IMPRESSION: Gallbladder wall thickening without stones or pericholecystic fluid may be due to underdistention or hypoproteinemia. No ascites is identified.  Mildly increased echogenicity of the liver consistent with fatty infiltration.   Electronically Signed   By: Drusilla Kanner M.D.   On: 11/11/2014 11:11    Casey Burkitt, MD 11/14/2014, 7:27 AM PGY-1, [facility] Family Medicine FPTS Intern pager: [phone], text pages welcome

## 2014-11-14 NOTE — Progress Notes (Signed)
ANTICOAGULATION CONSULT NOTE - Follow Up Consult  Pharmacy Consult for Heparin and Coumadin Indication: hx LV thrombus, probable PE  Allergies  Allergen Reactions  . Celexa [Citalopram Hydrobromide] Other (See Comments)    DISORIENTED  . Lisinopril Cough    Patient Measurements: Height: 5\' 9"  (175.3 cm) Weight: 179 lb 12.8 oz (81.557 kg) IBW/kg (Calculated) : 70.7  Vital Signs: Temp: 97.4 F (36.3 C) (08/15 1134) Temp Source: Oral (08/15 1134) BP: 118/72 mmHg (08/15 1134) Pulse Rate: 111 (08/15 0314)  Labs:  Recent Labs  11/12/14 0424  11/12/14 0425 11/13/14 0321 11/13/14 1300 11/14/14 0415  HGB  --   < > 10.9* 11.5*  --  11.1*  HCT  --   --  30.8* 32.5*  --  32.5*  PLT  --   --  307 294  --  326  LABPROT 37.7*  --   --  22.0*  --  17.0*  INR 3.95*  --   --  1.94*  --  1.37  HEPARINUNFRC 0.54  --   --   --  0.41 0.46  CREATININE  --   --  2.17* 1.22  --  1.05  < > = values in this interval not displayed.  Estimated Creatinine Clearance: 89.8 mL/min (by C-G formula based on Cr of 1.05).     Assessment: 43yom on coumadin pta for hx LV thrombus, admitted with hemoptysis and subtherapeutic INR. Heparin bridge was started. D-dimer came back elevated and VQ scan showed intermediate probability for PE. INR trended up to 3.95 yesterday (due to shock liver) and heparin was held. No reversal agents were given. INR back down to 1.9 and heparin resumed 8/14. Heparin drip 1900 uts/hr, heparin level is therapeutic 0.46. Coumadin to also resume 8/14 (last dose given was 8/9) INR 1.9 > restarted home dose Warfarin 7.5mg  x1 last night INR fell 1.3 as hemodynamics improve Noted antibiotics were changed to levaquin so will watch for drug interaction. Hemoptysis resolved. CBC stable.  Home coumadin dose: 7.5mg  daily  Goal of Therapy:  Heparin level 0.3-0.7 INR 2-3 Monitor platelets by anticoagulation protocol: Yes   Plan:  1) Continue heparin at 1900 units/hr 2) Coumadin  7.5mg  x 1 3) Daily heparin level, INR, CBC   Leota Sauers Pharm.D. CPP, BCPS Clinical Pharmacist [phone] 11/14/2014 2:07 PM

## 2014-11-15 ENCOUNTER — Inpatient Hospital Stay (HOSPITAL_COMMUNITY): Payer: Medicaid Other

## 2014-11-15 DIAGNOSIS — J189 Pneumonia, unspecified organism: Secondary | ICD-10-CM | POA: Insufficient documentation

## 2014-11-15 LAB — CBC
HCT: 33.6 % — ABNORMAL LOW (ref 39.0–52.0)
HEMOGLOBIN: 11.6 g/dL — AB (ref 13.0–17.0)
MCH: 29.7 pg (ref 26.0–34.0)
MCHC: 34.5 g/dL (ref 30.0–36.0)
MCV: 86.2 fL (ref 78.0–100.0)
PLATELETS: 380 10*3/uL (ref 150–400)
RBC: 3.9 MIL/uL — AB (ref 4.22–5.81)
RDW: 15.8 % — ABNORMAL HIGH (ref 11.5–15.5)
WBC: 15.7 10*3/uL — ABNORMAL HIGH (ref 4.0–10.5)

## 2014-11-15 LAB — PROTIME-INR
INR: 1.53 — AB (ref 0.00–1.49)
Prothrombin Time: 18.4 seconds — ABNORMAL HIGH (ref 11.6–15.2)

## 2014-11-15 LAB — BASIC METABOLIC PANEL
Anion gap: 6 (ref 5–15)
BUN: 9 mg/dL (ref 6–20)
CHLORIDE: 99 mmol/L — AB (ref 101–111)
CO2: 28 mmol/L (ref 22–32)
CREATININE: 1.04 mg/dL (ref 0.61–1.24)
Calcium: 8.6 mg/dL — ABNORMAL LOW (ref 8.9–10.3)
GFR calc Af Amer: >60 mL/min (ref >60)
GFR calc non Af Amer: >60 mL/min (ref >60)
GLUCOSE: 101 mg/dL — AB (ref 65–99)
POTASSIUM: 4.2 mmol/L (ref 3.5–5.1)
SODIUM: 133 mmol/L — AB (ref 135–145)

## 2014-11-15 LAB — CARBOXYHEMOGLOBIN
CARBOXYHEMOGLOBIN: 1.5 % (ref 0.5–1.5)
Carboxyhemoglobin: 1.7 % — ABNORMAL HIGH (ref 0.5–1.5)
METHEMOGLOBIN: 0.8 % (ref 0.0–1.5)
Methemoglobin: 0.8 % (ref 0.0–1.5)
O2 SAT: 66.3 %
O2 SAT: 64.5 %
TOTAL HEMOGLOBIN: 11.3 g/dL — AB (ref 13.5–18.0)
TOTAL HEMOGLOBIN: 12.1 g/dL — AB (ref 13.5–18.0)

## 2014-11-15 LAB — HEPARIN LEVEL (UNFRACTIONATED)
HEPARIN UNFRACTIONATED: 0.97 [IU]/mL — AB (ref 0.30–0.70)
Heparin Unfractionated: 0.88 IU/mL — ABNORMAL HIGH (ref 0.30–0.70)
Heparin Unfractionated: 0.69 IU/mL (ref 0.30–0.70)

## 2014-11-15 LAB — DIGOXIN LEVEL: Digoxin Level: 0.8 ng/mL (ref 0.8–2.0)

## 2014-11-15 MED ORDER — ISOSORB DINITRATE-HYDRALAZINE 20-37.5 MG PO TABS
1.0000 | ORAL_TABLET | Freq: Three times a day (TID) | ORAL | Status: DC
  Administered 2014-11-15 (×3): 1 via ORAL
  Filled 2014-11-15 (×3): qty 1

## 2014-11-15 MED ORDER — ISOSORB DINITRATE-HYDRALAZINE 20-37.5 MG PO TABS: 2.0000 | ORAL_TABLET | Freq: Three times a day (TID) | ORAL | Status: DC

## 2014-11-15 MED ORDER — SACUBITRIL-VALSARTAN 24-26 MG PO TABS
1.0000 | ORAL_TABLET | Freq: Two times a day (BID) | ORAL | Status: DC
  Administered 2014-11-15 – 2014-11-17 (×5): 1 via ORAL
  Filled 2014-11-15 (×6): qty 1

## 2014-11-15 MED ORDER — HEPARIN (PORCINE) IN NACL 100-0.45 UNIT/ML-% IJ SOLN
1450.0000 [IU]/h | INTRAMUSCULAR | Status: DC
  Administered 2014-11-17: 1450 [IU]/h via INTRAVENOUS
  Filled 2014-11-15 (×5): qty 250

## 2014-11-15 MED ORDER — WARFARIN SODIUM 7.5 MG PO TABS
7.5000 mg | ORAL_TABLET | Freq: Once | ORAL | Status: AC
  Administered 2014-11-15: 7.5 mg via ORAL
  Filled 2014-11-15: qty 1

## 2014-11-15 NOTE — Progress Notes (Signed)
ANTICOAGULATION CONSULT NOTE - Follow Up Consult  Pharmacy Consult for Heparin and Coumadin Indication: hx LV thrombus, probable PE  Allergies  Allergen Reactions  . Celexa [Citalopram Hydrobromide] Other (See Comments)    DISORIENTED  . Lisinopril Cough    Patient Measurements: Height: 5\' 9"  (175.3 cm) Weight: 181 lb 10.5 oz (82.4 kg) IBW/kg (Calculated) : 70.7  Vital Signs: Temp: 98.3 F (36.8 C) (08/16 0433) Temp Source: Oral (08/16 0433) BP: 129/82 mmHg (08/16 0432) Pulse Rate: 99 (08/16 0018)  Labs:  Recent Labs  11/13/14 0321 11/13/14 1300 11/14/14 0415 11/15/14 0440  HGB 11.5*  --  11.1* 11.6*  HCT 32.5*  --  32.5* 33.6*  PLT 294  --  326 380  LABPROT 22.0*  --  17.0* 18.4*  INR 1.94*  --  1.37 1.53*  HEPARINUNFRC  --  0.41 0.46 0.97*  CREATININE 1.22  --  1.05 1.04    Estimated Creatinine Clearance: 90.6 mL/min (by C-G formula based on Cr of 1.04).     Assessment: 43yom on coumadin pta for hx LV thrombus, admitted with hemoptysis and subtherapeutic INR. Heparin bridge was started. D-dimer came back elevated and VQ scan showed intermediate probability for PE. INR trended up to 3.95 yesterday (due to shock liver) and heparin was held. No reversal agents were given. INR back down to 1.9 and heparin resumed 8/14. Heparin drip 1900 uts/hr, heparin level is suparatherapeutic 0.97. Coumadin to also resume 8/14 (last dose given was 8/9) INR 1.9 > restarted home dose Warfarin 7.5mg  x 2 days. INR up to 1.53 today as hemodynamics improve Noted antibiotics were changed to levaquin so will watch for drug interaction. Hemoptysis resolved. CBC stable.  Home coumadin dose: 7.5mg  daily  Goal of Therapy:  Heparin level 0.3-0.7 INR 2-3 Monitor platelets by anticoagulation protocol: Yes   Plan:  1) Decrease heparin to 1750 units/hr 2) Coumadin 7.5mg  x 1 3) Daily heparin level, INR, CBC 4) F/u HL at 1300    Vinnie Level, PharmD., BCPS Clinical  Pharmacist Pager [phone]

## 2014-11-15 NOTE — Progress Notes (Addendum)
ANTICOAGULATION CONSULT NOTE - Follow Up Consult  Pharmacy Consult for Heparin and Coumadin Indication: hx LV thrombus, probable PE  Allergies  Allergen Reactions  . Celexa [Citalopram Hydrobromide] Other (See Comments)    DISORIENTED  . Lisinopril Cough    Patient Measurements: Height: 5\' 9"  (175.3 cm) Weight: 181 lb 10.5 oz (82.4 kg) IBW/kg (Calculated) : 70.7  Vital Signs: Temp: 98.2 F (36.8 C) (08/16 0823) Temp Source: Oral (08/16 0823) BP: 129/82 mmHg (08/16 0432) Pulse Rate: 109 (08/16 1013)  Labs:  Recent Labs  11/13/14 0321  11/14/14 0415 11/15/14 0440 11/15/14 1215  HGB 11.5*  --  11.1* 11.6*  --   HCT 32.5*  --  32.5* 33.6*  --   PLT 294  --  326 380  --   LABPROT 22.0*  --  17.0* 18.4*  --   INR 1.94*  --  1.37 1.53*  --   HEPARINUNFRC  --   < > 0.46 0.97* 0.88*  CREATININE 1.22  --  1.05 1.04  --   < > = values in this interval not displayed.  Estimated Creatinine Clearance: 90.6 mL/min (by C-G formula based on Cr of 1.04).     Assessment: 43yom on coumadin pta for hx LV thrombus, admitted with hemoptysis and subtherapeutic INR. Heparin bridge was started. D-dimer came back elevated and VQ scan showed intermediate probability for PE. INR trended up to 3.95 yesterday (due to shock liver) and heparin was held. No reversal agents were given. INR back down to 1.9 and heparin resumed 8/14. Heparin drip 1900 uts/hr, heparin level was therapeutic now large jump overnight 0.97 > dose dec 1750 uts/hr  HL still elevated 0.88  Coumadin to also resume 8/14 (last dose given was 8/9) INR 1.9 > restarted home dose Warfarin 7.5mg  x1 last night INR fell 1.3 as hemodynamics improve and many missed doses Now starting to respond to warfarin doses INR 1.53  Hemoptysis resolved. CBC stable.  Home coumadin dose: 7.5mg  daily  Goal of Therapy:  Heparin level 0.3-0.7 INR 2-3 Monitor platelets by anticoagulation protocol: Yes   Plan:  1) Hold heparin x1hr then decrease  1500 uts/hr Check 6hr HL 2) Coumadin 7.5mg  x 1 3) Daily heparin level, INR, CBC   Leota Sauers Pharm.D. CPP, BCPS Clinical Pharmacist [phone] 11/15/2014 3:44 PM

## 2014-11-15 NOTE — Progress Notes (Signed)
Family Medicine Teaching Service Daily Progress Note Intern Pager: [phone]  Patient name: Donya Tomaro Medical record number: [id number] Date of birth: [birth year] Age: 44 y.o. Gender: male  Primary Care Provider: Lora Paula, MD Consultants: Cardiology, Pulmonology Code Status: FULL  Pt Overview and Major Events to Date:  - 7/13-7/19/16: Previous hospitalization for hemoptysis treated as HCAP. - 11/11/14: ECHO with LVEF 5-10% (decreased from 20% on 10/13/14) - 11/11/14: Transferred to Cardiac SDU/ICU - 11/11/14: Central line placed, milrinone and norepinephrine started - 11/13/14: LE swelling resolved, negative for DVT via doppler  - 11/13/14: norepinephrine d/c'ed   Assessment and Plan: Derrick Tiegs is a 44 y.o. male presenting with cough, SOB, hemoptysis and pleuritic chest pain. PMH is significant for HTN, dilated cardiomyopathy (NICM, reduced EF), alcoholism, and anxiety. Suspect pulmonary embolism given tachycardia, SOB, subtherapeutic INR on admission on coumadin, known LV thrombus, and positive D-dimer. BNP elevated to 1689.6.Was 1091.1 at last admission. Acute on chronic CHF exacerbation with cardiogenic and hepatic shock. AKI and hepatic shock now resolved. Central line in place for administration of inotrope.    # HFrEF: Now in cardiogenic shock. Weight down 10 lb from admission after diuresis. Net negative 1 L from 8/15-8/16. Weight up 2 lbs since yesterday. No crackles on exam. - Heart Failure team consulted and following, appreciate recommendations.  - Milrinone decreased to 0.125 mcg/kg/min - Lasix switched to 40 mg PO daily.  - Receiving daily magnesium sulfate.  - Low dose Bidil, digoxin and spironolactone started 8/14. - Continue daily weights and strict I/Os - Trending carboxyhemoglobin. O2 saturation 66.3.   - Cards indicating may be transplant candidate if remain abstinent from ETOH.  - Digoxin level ordered.   # Hemoptysis: Per patient, resolved. V/Q scan  intermediate risk. Hemoglobin 11.6 << 11.1 < 11.5 < 10.9 < 12.1 < 12.9. Negative ANA and ANCA titers.  - Hemoglobin stable.  - Pharmacy consulting for anticoag management. Continue heparin gtt with goal level of 0.3-0.7.  - Coumadin restarted 8/14, per cards. Continue to bridge.  - Pulmonology consulted, appreciate recommendations. Low suspicion for mass. No need for bronchoscopy at this time.  # Hepatic Failure: Improving. AST 318 << 1751 < 2616 < 1290, ALT 640 << 1213 < 1251 < 697. LE dopplers showed pulsatile venous flow suggestive of right-sided heart pressure, concerning for hepatic congestion. RUQ U/S negative for ascites. Neg Hepatitis panel. Acetaminophen level normal.  - MELD score 10 (calculated 11/14/14) compared to 34 on 10/1214.   # AKI: Resolved. 1.04 << 1.05 < 2.17 < 1.22 (1.22 in July) Suspect AKI 2/2 hypoperfusion in setting of cardiac shock. Urine output high on lasix. Salicylate level normal. Normal renal U/S. UA positive for granular casts, consistent with ATN. UDS positive for opiates (received during hospitalization) - Nephrology signed off  - AntiGBM AB negative.   # Anticoagulation: INR: 1.53 << 1.37 < 1.94 <  3.95 < 3.29 < 2.62 < 1.72. Denies any hemoptysis at this point. Previously elevated due to shock liver.   - Continue heparin gtt to bridge coumadin  # HCAP: Patient afebrile. CXR 11/15/14 shows interim partial clearing of right mid lung and right lung base infiltrates. WBC increasing 15.7 (11/15/14) >> 13.0 11/14/14, up from 10.4 8/14. No longer trending procalcitonin. Clinical picture most consistent with CHF exacerbation rather than infection. Not currently meeting SIRS criteria. Blood cx NGTD. Day 7 of antibiotics. Received vancomycin (2.5 days) along with ceftazidime (4.5 days), on day 3 of levaquin.   - Continue Levaquin 750  mg daily. Today is day 7 of 7 of antibiotics.  - Monitor WBC and vitals.   #Hx of LV thrombus - Continue anticoagulation.   #HTN: Continues  to be normotensive - Continue home metoprolol.   # Chest Pain: Resolved right-sided chest pain. Troponins trended and peaked at 0.09.   # Nausea: Resolved.  # H/o Anxiety: not currently medically treated  # Alcohol Abuse: Denies ETOH use (states last drink was in February) - CIWA protocol.   FEN/GI: heart healthy diet  PPx: protonix 40 mg daily  Disposition: Admitted to the [facility] Service on telemetry.   Subjective:  Patient feeling well with only occasional cough. He has no pain and feels his breathing is much better. He is interested in when he can go home.   Objective: Temp:  [97.4 F (36.3 C)-98.7 F (37.1 C)] 98.3 F (36.8 C) (08/16 0433) Pulse Rate:  [99] 99 (08/16 0018) Resp:  [17-18] 17 (08/15 1600) BP: (114-129)/(71-86) 129/82 mmHg (08/16 0432) SpO2:  [97 %-100 %] 98 % (08/16 0432) Weight:  [181 lb 10.5 oz (82.4 kg)] 181 lb 10.5 oz (82.4 kg) (08/16 0433) Physical Exam: General: Awake, sitting up in chair watching TV.  Cardiovascular: Tachycardic. S3 appreciated over right lower sternal border.  Respiratory: CTAB, no crackles  Abdomen: Soft, non-tender, nondistended, positive BS.  Extremities: No pitting edema. DP pulses 2+ bilaterally.   Neuro: AOx3. Asks appropriate questions.  Skin: WWP.    Laboratory:  Recent Labs Lab 11/13/14 0321 11/14/14 0415 11/15/14 0440  WBC 10.4 13.0* 15.7*  HGB 11.5* 11.1* 11.6*  HCT 32.5* 32.5* 33.6*  PLT 294 326 380    Recent Labs Lab 11/11/14 0223 11/12/14 0425 11/13/14 0321 11/14/14 0415 11/15/14 0440  NA 132* 131* 136 136 133*  K 4.5 3.0* 3.2* 4.4 4.2  CL 95* 94* 95* 100* 99*  CO2 25 27 32 28 28  BUN 43* 40* 23* 12 9  CREATININE 2.41* 2.17* 1.22 1.05 1.04  CALCIUM 8.8* 8.1* 8.1* 8.3* 8.6*  PROT 6.4* 6.2*  --  6.3*  --   BILITOT 3.5* 5.3*  --  2.3*  --   ALKPHOS 73 71  --  76  --   ALT 1251* 1213*  --  640*  --   AST 2616* 1751*  --  318*  --   GLUCOSE 95 107* 111* 95 101*     Imaging/Diagnostic Tests:  Dg Chest 2 View  11/09/2014   CLINICAL DATA:  Recent pneumonia  EXAM: CHEST  2 VIEW  COMPARISON:  10/13/2014  FINDINGS: Borderline cardiomegaly. There is new nodular consolidation right perihilar region. Persistent infiltrate/ consolidation right lower lobe. Findings suspicious for recurrent multifocal pneumonia. No pulmonary edema. Follow-up to resolution after appropriate treatment recommended.  IMPRESSION: There is new nodular consolidation right perihilar region. Persistent infiltrate/ consolidation right lower lobe. Findings suspicious for recurrent multifocal pneumonia. No pulmonary edema. Follow-up to resolution after appropriate treatment recommended.   Electronically Signed   By: Natasha Mead M.D.   On: 11/09/2014 08:08   US Renal  11/10/2014   CLINICAL DATA:  Acute kidney injury  EXAM: RENAL / URINARY TRACT ULTRASOUND COMPLETE  COMPARISON:  None.  FINDINGS: Right Kidney:  Length: 11.7 cm. Echogenicity within normal limits. No mass or hydronephrosis visualized.  Left Kidney:  Length: 13.1 cm. Echogenicity within normal limits. No mass or hydronephrosis visualized.  Bladder:  Bladder is decompressed and therefore not evaluated.  IMPRESSION: Kidneys appear normal.  Bladder not evaluated.  Electronically Signed   By: Esperanza Heir M.D.   On: 11/10/2014 16:54   Nm Pulmonary Perf And Vent  11/10/2014   CLINICAL DATA:  Shortness of breath, chest pain, hemoptysis, history hypertension, non ischemic cardiomyopathy, former smoker  EXAM: NUCLEAR MEDICINE VENTILATION - PERFUSION LUNG SCAN  TECHNIQUE: Ventilation images were obtained in multiple projections using inhaled aerosol Tc-75m DTPA. Perfusion images were obtained in multiple projections after intravenous injection of Tc-16m MAA.  RADIOPHARMACEUTICALS:  39.0 mCi Technetium-81m DTPA aerosol inhalation and 5.9 mCi Technetium-64m MAA IV  COMPARISON:  None  Correlation: Chest radiograph 11/09/2014  FINDINGS: Ventilation:  Impaired ventilation identified in RIGHT middle and RIGHT lower lobes. RIGHT lower lobe absent perfusion corresponds to area of radiographic abnormality on preceding chest radiograph and recent CT. No abnormalities at RIGHT middle lobe on recent chest radiograph. Heart appears enlarged.  Perfusion: Matching diminished perfusion involving the RIGHT middle and RIGHT lower lobes. No additional areas of segmental or subsegmental perfusion decrease. Heart appears enlarged.  Chest radiograph: Significant RIGHT lower lobe infiltrate. No definite RIGHT middle lobe infiltrate.  IMPRESSION: Matching decreased ventilation, decreased perfusion, and radiographic abnormalities in RIGHT lower lobe.  Matching decreased subsegmental ventilation and perfusion abnormalities in RIGHT middle lobe with clear chest radiograph.  No perfusion abnormalities of the LEFT lung.  Findings represent an intermediate probability of pulmonary embolism.   Electronically Signed   By: Ulyses Southward M.D.   On: 11/10/2014 15:37   Dg Chest Port 1 View  11/12/2014   CLINICAL DATA:  Respiratory failure  EXAM: PORTABLE CHEST - 1 VIEW  COMPARISON:  11/11/2014  FINDINGS: Persistent airspace disease in the right lower lobe. Trace right pleural effusion. Hazy left lower lobe airspace disease. No pneumothorax. Stable cardiomediastinal silhouette. Right jugular central venous catheter with the tip projecting over the SVC.  No acute osseous abnormality.  IMPRESSION: 1. Persistent right lower lobe pneumonia. No significant interval change. Followup PA and lateral chest X-ray is recommended in 3-4 weeks following trial of antibiotic therapy to ensure resolution and exclude underlying malignancy. 2. Hazy left lower lobe airspace disease which may reflect atelectasis versus developing pneumonia.   Electronically Signed   By: Elige Ko   On: 11/12/2014 08:19   Dg Chest Port 1 View  11/11/2014   CLINICAL DATA:  Central line insertion.  EXAM: PORTABLE CHEST - 1  VIEW  COMPARISON:  Two-view chest x-ray 11/09/2014. CT of the chest 10/12/2014.  FINDINGS: The heart is enlarged. Any right IJ line is been placed. The tip projects over the right SVC, above the cavoatrial junction.  Progressive airspace consolidation is present in the right lower lobe. A right pleural effusion is suspected. The left lung is clear. Lung volumes are low.  IMPRESSION: 1. Interval placement of right IJ line without complication. The tip is in the mid SVC. 2. Stable cardiomegaly. 3. Progressive airspace consolidation in the right lower lobe.   Electronically Signed   By: Marin Roberts M.D.   On: 11/11/2014 20:53   US Abdomen Limited Ruq  11/11/2014   CLINICAL DATA:  Elevated liver function tests.  EXAM: US ABDOMEN LIMITED - RIGHT UPPER QUADRANT  COMPARISON:  None.  FINDINGS: Gallbladder:  No gallstones are identified. The gallbladder is incompletely distended. The wall is thickened at 0.5 cm. No pericholecystic fluid. Sonographer reports negative Murphy's sign.  Common bile duct:  Diameter: 0.4 cm.  Liver:  No focal lesion identified. The liver demonstrates mildly increased echogenicity. The portal vein is patent.  IMPRESSION:  Gallbladder wall thickening without stones or pericholecystic fluid may be due to underdistention or hypoproteinemia. No ascites is identified.  Mildly increased echogenicity of the liver consistent with fatty infiltration.   Electronically Signed   By: Drusilla Kanner M.D.   On: 11/11/2014 11:11    Casey Burkitt, MD 11/15/2014, 6:51 AM PGY-1, [facility] Family Medicine FPTS Intern pager: [phone], text pages welcome

## 2014-11-15 NOTE — Plan of Care (Signed)
Problem: Food- and Nutrition-Related Knowledge Deficit (NB-1.1) Goal: Nutrition education Formal process to instruct or train a patient/client in a skill or to impart knowledge to help patients/clients voluntarily manage or modify food choices and eating behavior to maintain or improve health. Outcome: Adequate for Discharge Nutrition Education Note  RD consulted for nutrition education regarding a Heart Healthy diet.   Lipid Panel     Component Value Date/Time    CHOL 262* 07/21/2012 1026    TRIG 163* 07/21/2012 1026    HDL 57 07/21/2012 1026    CHOLHDL 4.6 07/21/2012 1026    VLDL 33 07/21/2012 1026    LDLCALC 172* 07/21/2012 1026   Spoke with pt at bedside, who reports his appetite is always good. He admits weight loss is due to diuretic use. He reveals that he has made a lot of changes to his diet PTA, including transition from fired to baked proteins, avoid salt at the table and while cooking, and transitioning from canned to fresh vegetables. He uses salt-free seasonings to season his foods; pt is very knowledgeable about diet principles. He was able to describe the principles of a Heart Healthy diet to me very accurately and asked appropriate questions.   RD provided "Heart Healthy Nutrition Therapy" handout from the Academy of [employer]. Reviewed patient's dietary recall. Provided examples on ways to decrease sodium and fat intake in diet. Discouraged intake of processed foods and use of salt shaker. Encouraged fresh fruits and vegetables as well as whole grain sources of carbohydrates to maximize fiber intake. Teach back method used.  Expect fair to good compliance.  Body mass index is 26.81 kg/(m^2). Pt meets criteria for overweight based on current BMI.  Current diet order is Heart Healthy, patient is consuming approximately 100% of meals at this time. Labs and medications reviewed. No further nutrition interventions warranted at this time. RD contact information  provided. If additional nutrition issues arise, please re-consult RD.  Robert Booth A. Mayford Knife, RD, LDN, CDE Pager: [phone] After hours Pager: [phone]

## 2014-11-15 NOTE — Care Management Note (Signed)
Case Management Note  Patient Details  Name: Gabrel Aujla MRN: [id number] Date of Birth: 1970/09/27  Subjective/Objective:       Pt adm w pneumonia, heart failure             Action/Plan: lives w fam, pcp dr Baldomero Lamy at [place] [facility]   Expected Discharge Date:                  Expected Discharge Plan:  Home w Home Health Services  In-House Referral:     Discharge planning Services  CM Consult, Medication Assistance, Indigent Health Clinic  Post Acute Care Choice:    Choice offered to:     DME Arranged:    DME Agency:     HH Arranged:    HH Agency:     Status of Service:     Medicare Important Message Given:    Date Medicare IM Given:    Medicare IM give by:    Date Additional Medicare IM Given:    Additional Medicare Important Message give by:     If discussed at Long Length of Stay Meetings, dates discussed:    Additional Comments:pt uses orange card at [place] [facility] for meds. Gave pt 30day free entresto card. Have call into [place] [facility] to see if they carry entresto. Have called [employer] and await pt assist form to be faxed also.  Hanley Hays, RN 11/15/2014, 9:36 AM

## 2014-11-15 NOTE — Progress Notes (Signed)
ANTICOAGULATION CONSULT NOTE - Follow Up Consult  Pharmacy Consult for Heparin Indication: hx LV thrombus, probable PE  Allergies  Allergen Reactions  . Celexa [Citalopram Hydrobromide] Other (See Comments)    DISORIENTED  . Lisinopril Cough    Patient Measurements: Height: 5\' 9"  (175.3 cm) Weight: 181 lb 10.5 oz (82.4 kg) IBW/kg (Calculated) : 70.7  Vital Signs: Temp: 98 F (36.7 C) (08/16 2000) Temp Source: Oral (08/16 2000) BP: 115/69 mmHg (08/16 2000)  Labs:  Recent Labs  11/13/14 0321  11/14/14 0415 11/15/14 0440 11/15/14 1215 11/15/14 2250  HGB 11.5*  --  11.1* 11.6*  --   --   HCT 32.5*  --  32.5* 33.6*  --   --   PLT 294  --  326 380  --   --   LABPROT 22.0*  --  17.0* 18.4*  --   --   INR 1.94*  --  1.37 1.53*  --   --   HEPARINUNFRC  --   < > 0.46 0.97* 0.88* 0.69  CREATININE 1.22  --  1.05 1.04  --   --   < > = values in this interval not displayed.  Estimated Creatinine Clearance: 90.6 mL/min (by C-G formula based on Cr of 1.04).     Assessment: Robert Booth on coumadin pta for hx LV thrombus, admitted with hemoptysis and subtherapeutic INR. Heparin bridge was started. D-dimer came back elevated and VQ scan showed intermediate probability for PE. Currently on IV heparin and Coumadin. HL this evening is therapeutic at 0.69 but on upper end of goal range.  Hemoptysis resolved. RN reports no s/s of bleeding. CBC stable.   Home coumadin dose: 7.5mg  daily  Goal of Therapy:  Heparin level 0.3-0.7 INR 2-3 Monitor platelets by anticoagulation protocol: Yes   Plan:  1) Decrease heparin infusion to 1450 units/hr  2) Check 6hr HL 3) Daily heparin level, INR, CBC   Vinnie Level, PharmD., BCPS Clinical Pharmacist Pager [phone]

## 2014-11-15 NOTE — Progress Notes (Signed)
Advanced Heart Failure Rounding Note  Primary Cardiologist:   Reason for referral: A/C systolic HF EF 5-10%  Subjective:    Robert Booth is a 44 y.o. male with hx of systolic HF with NICM secondary to prolonged ETOH abuse, EF 5-10% by Echo 11/11/14, and CKD stage 2.  He presented to [facility] with hemoptysis. RLL + cardiogenic shock. Placed on antibiotics. CO-OX 27%. Required short term norepi.   Yesterday milrinone cut back to 0.25 mcg. CO-OX remains >60%.   Denies SOB/Orthopnea.    Echo 11/11/14 EF 5-10%   Objective:   Weight Range: 181 lb 10.5 oz (82.4 kg) Body mass index is 26.81 kg/(m^2).   Vital Signs:   Temp:  [97.4 F (36.3 C)-98.7 F (37.1 C)] 98.3 F (36.8 C) (08/16 0433) Pulse Rate:  [99] 99 (08/16 0018) Resp:  [17-18] 17 (08/15 1600) BP: (114-129)/(71-86) 129/82 mmHg (08/16 0432) SpO2:  [97 %-100 %] 98 % (08/16 0432) Weight:  [181 lb 10.5 oz (82.4 kg)] 181 lb 10.5 oz (82.4 kg) (08/16 0433) Last BM Date: 11/14/14  Weight change: Filed Weights   11/13/14 0331 11/14/14 0314 11/15/14 0433  Weight: 181 lb 3.5 oz (82.2 kg) 179 lb 12.8 oz (81.557 kg) 181 lb 10.5 oz (82.4 kg)    Intake/Output:   Intake/Output Summary (Last 24 hours) at 11/15/14 0743 Last data filed at 11/15/14 0700  Gross per 24 hour  Intake 1511.74 ml  Output   2550 ml  Net -1038.26 ml     Physical Exam: CVP 2-3  General: NAD. Sitting in chair. Marland Kitchen Neuro: Alert and oriented X 3. Moves all extremities spontaneously. Psych: Normal affect. HEENT: Normal Neck: RIJ TLC. Supple without bruits. JVP flat with prominent CV waves Lungs: Resp regular and unlabored, CTA. Heart: PMI laterally displaced RRR s3, s4, or murmurs. +S3 Abdomen: Soft, non-tender, non-distended, BS + x 4.  Extremities: No clubbing, cyanosis. DP/PT/Radials 2+ and equal bilaterally. No edema.   Telemetry: Sinus tach 100ss  Labs: CBC  Recent Labs  11/14/14 0415 11/15/14 0440  WBC 13.0* 15.7*  HGB 11.1*  11.6*  HCT 32.5* 33.6*  MCV 85.1 86.2  PLT 326 380   Basic Metabolic Panel  Recent Labs  11/12/14 1410  11/14/14 0415 11/15/14 0440  NA  --   < > 136 133*  K  --   < > 4.4 4.2  CL  --   < > 100* 99*  CO2  --   < > 28 28  GLUCOSE  --   < > 95 101*  BUN  --   < > 12 9  CALCIUM  --   < > 8.3* 8.6*  MG 1.8  --  1.7  --   < > = values in this interval not displayed. Liver Function Tests  Recent Labs  11/14/14 0415  AST 318*  ALT 640*  ALKPHOS 76  BILITOT 2.3*  PROT 6.3*  ALBUMIN 2.4*   No results for input(s): LIPASE, AMYLASE in the last 72 hours. Cardiac Enzymes No results for input(s): CKTOTAL, CKMB, CKMBINDEX, TROPONINI in the last 72 hours.  BNP: BNP (last 3 results)  Recent Labs  10/12/14 1710 10/17/14 0438 11/09/14 0820  BNP 879.0* 1091.1* 1689.6*    ProBNP (last 3 results) No results for input(s): PROBNP in the last 8760 hours.   D-Dimer No results for input(s): DDIMER in the last 72 hours. Hemoglobin A1C No results for input(s): HGBA1C in the last 72 hours. Fasting Lipid Panel No results for  input(s): CHOL, HDL, LDLCALC, TRIG, CHOLHDL, LDLDIRECT in the last 72 hours. Thyroid Function Tests No results for input(s): TSH, T4TOTAL, T3FREE, THYROIDAB in the last 72 hours.  Invalid input(s): FREET3  Other results:     Imaging/Studies:  No results found.  Latest Echo  Latest Cath   Medications:     Scheduled Medications: . antiseptic oral rinse  7 mL Mouth Rinse BID  . digoxin  0.25 mg Oral Daily  . folic acid  1 mg Oral Daily  . furosemide  40 mg Oral Daily  . isosorbide-hydrALAZINE  1 tablet Oral TID  . levofloxacin  750 mg Oral Daily  . multivitamin with minerals  1 tablet Oral Daily  . pantoprazole  40 mg Oral Daily  . sodium chloride  3 mL Intravenous Q12H  . spironolactone  25 mg Oral Daily  . thiamine  100 mg Oral Daily   Or  . thiamine  100 mg Intravenous Daily  . warfarin  7.5 mg Oral ONCE-1800  . Warfarin -  Pharmacist Dosing Inpatient   Does not apply q1800    Infusions: . heparin 1,750 Units/hr (11/15/14 3662)  . milrinone 0.25 mcg/kg/min (11/14/14 2000)    PRN Medications: benzonatate, polyethylene glycol   Assessment/Plan   1. Acute on chronic systolic CHF with NICM, -> cardiogenic shock 2. AKI on CKD stage 2 3. Elevated troponin 4. ETOH abuse quit February 2016 5. Acute renal failure 6. Shock liver 7. Acute respiratory failure 8. PNA with hemoptysis 9. NSVT 10. 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 Blood Type  B+   Admitted with profound cardiogenic shock in setting of severe ETOH cardiomyopathy (EF 10%) and RLL PNA.   Tolerating milrinone wean. CO-OX stable 66%. Cut back milrinone 0.125 mcg. No bb with low output. Continue dig 0.25 mg daily. Check dig level today. Volume status low. CVP 2-3. Hold po lasix today. Increase Bidil to 2 tabs tid.(has orange card for meds)..  Continue 25 mg spiro daily. Anticipate adding ace soon. Renal function stable.   RLL PNA. CXR improving.   Blood cultures negative. WBC trending up. Afebrile. Day 6 antibiotics per primary team. UA ok.   INR 1.53 Coumadin per pharmacy.    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 May be transplant candidate if continues abstinence from ETOH.   Consult cardiac rehab and dietitian.    CLEGG,AMY, NP-C   7:43 AM  Advanced Heart Failure Team Pager [phone] (M-F; 7a - 4p)  Please contact [facility] for night-coverage after hours (4p -7a ) and weekends on amion.  Patient seen and examined with Tonye Becket, NP. We discussed all aspects of the encounter. I agree with the assessment and plan as stated above.   Continues to improve. Will attempt to wean milrinone today. Continue Bidil. Add low-dose Entresto. CXR shows persistent infiltrates but symptomatically much improved with diuresis and abx. Continue coumadin for LV thrombus. Need to watch inhouse for at least 24-36 hours  after stopping inotropes. Keep in SDU.   [name],MD 11:46 PM

## 2014-11-15 NOTE — Progress Notes (Signed)
Pt. Was off milrinone drip since changed of shift, co-ox resulted per outgoing nurse, text paged Dr. Julious Payer.   RN wants to clarify order of milrinone wether to restart or not. Patient stable denies any symptoms, verbalized earlier feeling better with initial assessment. Dr. Dorris Fetch made aware and  with order not to restart drip since patient stable at present. Will continue to monitor.

## 2014-11-15 NOTE — Progress Notes (Signed)
CARDIAC REHAB PHASE I   PRE:  Rate/Rhythm: 105 ST    BP: sitting 129/94    SaO2:   MODE:  Ambulation: 2100 ft   POST:  Rate/Rhythm: 128 ST    BP: sitting 140/97      SaO2:   Pt moving well and thankful to walk. Quick pace pushing IV pole. Did not need to rest in 2100 ft. HR up to 128 ST and maintained rest of walk. BP elevated. Pt talked openly about desire to abstain from ETOH and take care of himself. Gave low sodium diet sheet and HF packet. Will f/u tomorrow to review. Pt to watch video tonight.  [phone]   Elissa Lovett [place] CES, ACSM 11/15/2014 2:18 PM

## 2014-11-16 DIAGNOSIS — J189 Pneumonia, unspecified organism: Secondary | ICD-10-CM | POA: Insufficient documentation

## 2014-11-16 LAB — CBC
HEMATOCRIT: 36.7 % — AB (ref 39.0–52.0)
HEMOGLOBIN: 12.7 g/dL — AB (ref 13.0–17.0)
MCH: 29.5 pg (ref 26.0–34.0)
MCHC: 34.6 g/dL (ref 30.0–36.0)
MCV: 85.2 fL (ref 78.0–100.0)
Platelets: 434 10*3/uL — ABNORMAL HIGH (ref 150–400)
RBC: 4.31 MIL/uL (ref 4.22–5.81)
RDW: 15.8 % — ABNORMAL HIGH (ref 11.5–15.5)
WBC: 16.2 10*3/uL — ABNORMAL HIGH (ref 4.0–10.5)

## 2014-11-16 LAB — BASIC METABOLIC PANEL
ANION GAP: 5 (ref 5–15)
BUN: 10 mg/dL (ref 6–20)
CHLORIDE: 103 mmol/L (ref 101–111)
CO2: 26 mmol/L (ref 22–32)
Calcium: 8.9 mg/dL (ref 8.9–10.3)
Creatinine, Ser: 0.99 mg/dL (ref 0.61–1.24)
Glucose, Bld: 101 mg/dL — ABNORMAL HIGH (ref 65–99)
POTASSIUM: 4.4 mmol/L (ref 3.5–5.1)
SODIUM: 134 mmol/L — AB (ref 135–145)

## 2014-11-16 LAB — CARBOXYHEMOGLOBIN
Carboxyhemoglobin: 1.4 % (ref 0.5–1.5)
Methemoglobin: 0.8 % (ref 0.0–1.5)
O2 Saturation: 56.6 %
TOTAL HEMOGLOBIN: 13.4 g/dL — AB (ref 13.5–18.0)

## 2014-11-16 LAB — HEPATIC FUNCTION PANEL
ALBUMIN: 2.7 g/dL — AB (ref 3.5–5.0)
ALK PHOS: 97 U/L (ref 38–126)
ALT: 391 U/L — AB (ref 17–63)
AST: 148 U/L — ABNORMAL HIGH (ref 15–41)
Bilirubin, Direct: 0.5 mg/dL (ref 0.1–0.5)
Indirect Bilirubin: 1.3 mg/dL — ABNORMAL HIGH (ref 0.3–0.9)
TOTAL PROTEIN: 7.3 g/dL (ref 6.5–8.1)
Total Bilirubin: 1.8 mg/dL — ABNORMAL HIGH (ref 0.3–1.2)

## 2014-11-16 LAB — HEPARIN LEVEL (UNFRACTIONATED): HEPARIN UNFRACTIONATED: 0.6 [IU]/mL (ref 0.30–0.70)

## 2014-11-16 LAB — PROTIME-INR
INR: 1.39 (ref 0.00–1.49)
Prothrombin Time: 17.2 seconds — ABNORMAL HIGH (ref 11.6–15.2)

## 2014-11-16 LAB — DIGOXIN LEVEL: Digoxin Level: 0.2 ng/mL — ABNORMAL LOW (ref 0.8–2.0)

## 2014-11-16 MED ORDER — ISOSORB DINITRATE-HYDRALAZINE 20-37.5 MG PO TABS: 2.0000 | ORAL_TABLET | Freq: Three times a day (TID) | ORAL | Status: DC

## 2014-11-16 MED ORDER — WARFARIN SODIUM 10 MG PO TABS
11.2500 mg | ORAL_TABLET | Freq: Once | ORAL | Status: AC
  Administered 2014-11-16: 11.25 mg via ORAL
  Filled 2014-11-16: qty 1

## 2014-11-16 MED ORDER — ISOSORB DINITRATE-HYDRALAZINE 20-37.5 MG PO TABS
1.0000 | ORAL_TABLET | Freq: Three times a day (TID) | ORAL | Status: DC
  Administered 2014-11-16 – 2014-11-17 (×4): 1 via ORAL
  Filled 2014-11-16 (×4): qty 1

## 2014-11-16 NOTE — Progress Notes (Signed)
CARDIAC REHAB PHASE I   PRE:  Rate/Rhythm: 107 sT    BP: sitting 102/62    SaO2:   MODE:  Ambulation: 4200 ft   POST:  Rate/Rhythm: 123 ST    BP: sitting 113/70     SaO2:   Pt eager to walk. Felt good. Planned to walk 2600 ft and kept adding laps since he felt well. Ended walk with 12 laps around unit. BP lower today. Pt with very good understanding of education. Performed teach back on low sodium, when to call MD, exercise.  Pt is interested in CRPII and will send referral to [place]. [phone]  Elissa Lovett [place] CES, ACSM 11/16/2014 12:22 PM

## 2014-11-16 NOTE — Discharge Summary (Signed)
Family Medicine Teaching [facility] Discharge Summary  Patient name: Leon Montoya Medical record number: [id number] Date of birth: December 17, 1970 Age: 44 y.o. Gender: male Date of Admission: 11/09/2014  Date of Discharge: 11/17/2014 Admitting Physician: Tobey Grim, MD  Primary Care Provider: Lora Paula, MD Consultants: CHF team, Cardiology, Nephrology, Pulmonology  Indication for Hospitalization: hemoptysis, shortness of breath  Discharge Diagnoses/Problem List:  1. Acute-on-chronic systolic CHF with NICM, developed cardiogenic shock 2. AKI on CKD stage 2 3. ETOH abuse (quit February 2016) 5. Acute renal failure 6. Shock liver 7. Acute respiratory failure 8. PNA with hemoptysis 9. NSVT 10. 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 Anticoagulated on coumadin  Disposition: Home   Discharge Condition: Improved  Discharge Exam: Please see progress note by Family Medicine resident for 11/17/14.   Brief Hospital Course:  Mr. Norvin Ohlin is a 43-y.o. 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 PMH is also significant for HTN, alcoholism, and anxiety. Of note, he was hospitalized in 09/2014 for RLL PNA.   In the [facility], patient had a chest x-ray showing multifocal pneumonia, with consolidations in right lower lobe and right perihilar region. EKG was negative for ischemic changes. Hemoglobin was near patient's baseline. BNP was elevated at 1689, troponin at 0.04, and creatinine at 1.6, suggesting a heart failure exacerbation. INR was subtherapeutic at 1.7. Broad-spectrum antibiotics were started to treat for HCAP.  Patient was admitted to the step down unit for further monitoring. He continued to be tachycardic and tachypneic. He was requiring 2L O2 by nasal canula. Patient was found to have transaminitis with AST of 1290 and ALT of 697, AKI  with increasing creatinine, and increasing INR off of coumadin.  The Heart Failure Team was consulted, and ECHO showed EF of 5-10%, consistent with cardiogenic shock with low output. Progressive renal and liver failure was concerning for hypoperfusion. Patient was transferred to the cardiac unit on 11/11/14, and a central line was placed in order to administer inotrope support. Co-ox was 27% and CVP was 24, consistent with profound cardiogenic shock. Milrinone and norepinephrine were started on 11/11/14 along with IV lasix. Symptoms of shortness of breath and lower extremity edema resolved. Hemoptysis also resolved though cough persisted. Renal and hepatic function improved. Supplemental oxygen and norepinephrine were discontinued on 11/13/14, and low-dose bidil, digoxin and spironolactone were started. Milrinone was discontinued 11/15/14, and Sherryll Burger was started. Patient continued to feel much improved and was able to walk laps. Co-ox was 61.9% on day of discharge, and central line was removed.   HFrEF: Cardiogenic shock. Was diuresed 10 lbs. - Medications adjusted as stated above. Additionally, on 40 mg PO lasix.  - May be transplant candidate if remains abstinent from ETOH. B+ blood type.  - Not an LVAD candidate due to RV dysfuction.  - Nutrition counseled patient on heart healthy diet.  - Referral for Cardiac Rehab Phase II sent.   Possible HCAP: Patient afebrile throughout hospitalization. WBC stable at 15.3 at time of discharge. Clinical picture most consistent with CHF exacerbation rather than infection. Blood cx negative. Received vancomycin (2.5 days) along with ceftazidime (4.5 days), and 3 days of levaquin while hospitalized (and 1 day of levaquin prior to hospitalization).  - Completed 7 day course of antibiotics 11/15/14.  - CXR 11/15/14 showed interim partial clearing of right mid lung and right lung base infiltrates.  Hemoptysis: Resolved as of 11/12/14. V/Q scan intermediate  risk of PE. LE  dopplers negative. Hemoglobin stable throughout admission. Negative ANA and ANCA titers.  - Coumadin restarted 8/14.  - Pulmonology was consulted and had low suspicion for mass or AVM.  - Suspect secondary to resolving pneumonia - CT with contrast could not be performed due to AKI.   Hepatic Failure: Improved. AST 318 and ALT 640 on 11/16/14, peaked at 2616 and 1251, respectively. RUQ U/S negative for ascites. Neg Hepatitis panel. Acetaminophen level normal.  - Secondary to hypoperfusion  AKI: Resolved. Creatinine 0.94 on day of discharge. Creatinine peaked at 2.17. Salicylate was level normal. Normal renal U/S. UA was positive for granular casts, consistent with ATN. UDS positive for opiates (received during hospitalization) - Secondary to hypoperfusion - AntiGBM AB negative.   Anticoagulation: INR: 1.60 on discharge - Goal INR is 2.0-3.0.  - Patient discharged on coumadin 7.5 mg with lovenox to bridge.  - Plts 389  Hx of LV mural thrombus - Anticoagulation continued.   Chest Pain: Resolved right-sided chest pain. Likely costochondritis from frequent coughing.   -Troponins trended and peaked at 0.09.   HTN: Was normotensive throughout admission. - Continue home metoprolol.   Issues for Follow Up:  1. Shortness of breath 2. INR on discharge 1.6, please check INR for a goal of 2-3 and adjust Coumadin as needed  3. Patient has been provided with Lovenox shots for 5 days for bridging anti-cougulation, make sure patient has not had an issues these shots.  4. Patient medications have been changed significantly, please review with patient 5. Ability to abstain from alcohol  6. Consider outpatient chest CT to re-evaluate RLL consolidation seen on CT 10/12/14  , Significant Procedures: - 11/11/14 Central line placed - 11/11/14 ECHO showed EF 5-10% and dilation of RA and RV - 11/11/14 LE venous dopplers negative  Significant Labs and Imaging:   Recent Labs Lab 11/15/14 0440  11/16/14 0700 11/17/14 0450  WBC 15.7* 16.2* 15.3*  HGB 11.6* 12.7* 12.0*  HCT 33.6* 36.7* 33.9*  PLT 380 434* 389    Recent Labs Lab 11/11/14 0223 11/12/14 0425 11/12/14 1410 11/13/14 0321 11/14/14 0415 11/15/14 0440 11/16/14 0700 11/17/14 0450  NA 132* 131*  --  136 136 133* 134* 138  K 4.5 3.0*  --  3.2* 4.4 4.2 4.4 4.2  CL 95* 94*  --  95* 100* 99* 103 104  CO2 25 27  --  32 28 28 26 26   GLUCOSE 95 107*  --  111* 95 101* 101* 88  BUN 43* 40*  --  23* 12 9 10 7   CREATININE 2.41* 2.17*  --  1.22 1.05 1.04 0.99 0.94  CALCIUM 8.8* 8.1*  --  8.1* 8.3* 8.6* 8.9 8.9  MG  --   --  1.8  --  1.7  --   --   --   PHOS  --  2.9  --   --   --   --   --   --   ALKPHOS 73 71  --   --  76  --  97  --   AST 2616* 1751*  --   --  318*  --  148*  --   ALT 1251* 1213*  --   --  640*  --  391*  --   ALBUMIN 2.8* 2.6*  --   --  2.4*  --  2.7*  --    Dg Chest 2 View  11/15/2014   CLINICAL DATA:  Congestive heart  failure. Community acquired pneumonia .  EXAM: CHEST  2 VIEW  COMPARISON:  11/12/2014.  CT 10/12/2014.  FINDINGS: Right IJ line in stable position. Partial resolution of rounded infiltrates in the right mid lung and right base. Continued close follow-up chest x-rays suggested to exclude underlying mass lesion. No significant pleural effusion. No pneumothorax. Heart size normal. No acute bony abnormality.  IMPRESSION: 1. Right IJ line in stable position. 2. Interim partial clearing of right mid lung and right lung base rounded infiltrates. Continued close follow-up chest x-rays are suggested to demonstrate complete clearing to exclude underlying mass lesion   Electronically Signed   By: Maisie Fus  Register   On: 11/15/2014 07:47   Dg Chest 2 View  11/09/2014   CLINICAL DATA:  Recent pneumonia  EXAM: CHEST  2 VIEW  COMPARISON:  10/13/2014  FINDINGS: Borderline cardiomegaly. There is new nodular consolidation right perihilar region. Persistent infiltrate/ consolidation right lower lobe. Findings  suspicious for recurrent multifocal pneumonia. No pulmonary edema. Follow-up to resolution after appropriate treatment recommended.  IMPRESSION: There is new nodular consolidation right perihilar region. Persistent infiltrate/ consolidation right lower lobe. Findings suspicious for recurrent multifocal pneumonia. No pulmonary edema. Follow-up to resolution after appropriate treatment recommended.   Electronically Signed   By: Natasha Mead M.D.   On: 11/09/2014 08:08   US Renal  11/10/2014   CLINICAL DATA:  Acute kidney injury  EXAM: RENAL / URINARY TRACT ULTRASOUND COMPLETE  COMPARISON:  None.  FINDINGS: Right Kidney:  Length: 11.7 cm. Echogenicity within normal limits. No mass or hydronephrosis visualized.  Left Kidney:  Length: 13.1 cm. Echogenicity within normal limits. No mass or hydronephrosis visualized.  Bladder:  Bladder is decompressed and therefore not evaluated.  IMPRESSION: Kidneys appear normal.  Bladder not evaluated.   Electronically Signed   By: Esperanza Heir M.D.   On: 11/10/2014 16:54   Nm Pulmonary Perf And Vent  11/10/2014   CLINICAL DATA:  Shortness of breath, chest pain, hemoptysis, history hypertension, non ischemic cardiomyopathy, former smoker  EXAM: NUCLEAR MEDICINE VENTILATION - PERFUSION LUNG SCAN  TECHNIQUE: Ventilation images were obtained in multiple projections using inhaled aerosol Tc-11m DTPA. Perfusion images were obtained in multiple projections after intravenous injection of Tc-21m MAA.  RADIOPHARMACEUTICALS:  39.0 mCi Technetium-59m DTPA aerosol inhalation and 5.9 mCi Technetium-73m MAA IV  COMPARISON:  None  Correlation: Chest radiograph 11/09/2014  FINDINGS: Ventilation: Impaired ventilation identified in RIGHT middle and RIGHT lower lobes. RIGHT lower lobe absent perfusion corresponds to area of radiographic abnormality on preceding chest radiograph and recent CT. No abnormalities at RIGHT middle lobe on recent chest radiograph. Heart appears enlarged.  Perfusion:  Matching diminished perfusion involving the RIGHT middle and RIGHT lower lobes. No additional areas of segmental or subsegmental perfusion decrease. Heart appears enlarged.  Chest radiograph: Significant RIGHT lower lobe infiltrate. No definite RIGHT middle lobe infiltrate.  IMPRESSION: Matching decreased ventilation, decreased perfusion, and radiographic abnormalities in RIGHT lower lobe.  Matching decreased subsegmental ventilation and perfusion abnormalities in RIGHT middle lobe with clear chest radiograph.  No perfusion abnormalities of the LEFT lung.  Findings represent an intermediate probability of pulmonary embolism.   Electronically Signed   By: Ulyses Southward M.D.   On: 11/10/2014 15:37   Dg Chest Port 1 View  11/12/2014   CLINICAL DATA:  Respiratory failure  EXAM: PORTABLE CHEST - 1 VIEW  COMPARISON:  11/11/2014  FINDINGS: Persistent airspace disease in the right lower lobe. Trace right pleural effusion. Hazy left lower lobe airspace disease.  No pneumothorax. Stable cardiomediastinal silhouette. Right jugular central venous catheter with the tip projecting over the SVC.  No acute osseous abnormality.  IMPRESSION: 1. Persistent right lower lobe pneumonia. No significant interval change. Followup PA and lateral chest X-ray is recommended in 3-4 weeks following trial of antibiotic therapy to ensure resolution and exclude underlying malignancy. 2. Hazy left lower lobe airspace disease which may reflect atelectasis versus developing pneumonia.   Electronically Signed   By: Elige Ko   On: 11/12/2014 08:19   Dg Chest Port 1 View  11/11/2014   CLINICAL DATA:  Central line insertion.  EXAM: PORTABLE CHEST - 1 VIEW  COMPARISON:  Two-view chest x-ray 11/09/2014. CT of the chest 10/12/2014.  FINDINGS: The heart is enlarged. Any right IJ line is been placed. The tip projects over the right SVC, above the cavoatrial junction.  Progressive airspace consolidation is present in the right lower lobe. A right pleural  effusion is suspected. The left lung is clear. Lung volumes are low.  IMPRESSION: 1. Interval placement of right IJ line without complication. The tip is in the mid SVC. 2. Stable cardiomegaly. 3. Progressive airspace consolidation in the right lower lobe.   Electronically Signed   By: Marin Roberts M.D.   On: 11/11/2014 20:53   US Abdomen Limited Ruq  11/11/2014   CLINICAL DATA:  Elevated liver function tests.  EXAM: US ABDOMEN LIMITED - RIGHT UPPER QUADRANT  COMPARISON:  None.  FINDINGS: Gallbladder:  No gallstones are identified. The gallbladder is incompletely distended. The wall is thickened at 0.5 cm. No pericholecystic fluid. Sonographer reports negative Murphy's sign.  Common bile duct:  Diameter: 0.4 cm.  Liver:  No focal lesion identified. The liver demonstrates mildly increased echogenicity. The portal vein is patent.  IMPRESSION: Gallbladder wall thickening without stones or pericholecystic fluid may be due to underdistention or hypoproteinemia. No ascites is identified.  Mildly increased echogenicity of the liver consistent with fatty infiltration.   Electronically Signed   By: Drusilla Kanner M.D.   On: 11/11/2014 11:11    Results/Tests Pending at Time of Discharge: None  Discharge Medications:    Medication List    STOP taking these medications        levofloxacin 500 MG tablet  Commonly known as:  LEVAQUIN     losartan 50 MG tablet  Commonly known as:  COZAAR     metoprolol succinate 100 MG 24 hr tablet  Commonly known as:  TOPROL-XL      TAKE these medications        digoxin 0.25 MG tablet  Commonly known as:  LANOXIN  Take 1 tablet (0.25 mg total) by mouth daily.     enoxaparin 120 MG/0.8ML injection  Commonly known as:  LOVENOX  Inject 0.8 mLs (120 mg total) into the skin daily.  Start taking on:  11/18/2014     furosemide 40 MG tablet  Commonly known as:  LASIX  Take 1 tablet (40 mg total) by mouth daily. 40 mg by mouth daily, take an extra 40 mg dose 6  hrs later as needed for weight gain     guaiFENesin-codeine 100-10 MG/5ML syrup  Take 10 mLs by mouth every 4 (four) hours as needed for cough.     isosorbide-hydrALAZINE 20-37.5 MG per tablet  Commonly known as:  BIDIL  Take 1 tablet by mouth 3 (three) times daily.     sacubitril-valsartan 24-26 MG  Commonly known as:  ENTRESTO  Take 1 tablet by mouth  2 (two) times daily.     spironolactone 25 MG tablet  Commonly known as:  ALDACTONE  Take 1 tablet (25 mg total) by mouth daily.     warfarin 7.5 MG tablet  Commonly known as:  COUMADIN  Take 1 tablet (7.5 mg total) by mouth daily.        Discharge Instructions: Please refer to Patient Instructions section of EMR for full details.  Patient was counseled important signs and symptoms that should prompt return to medical care, changes in medications, dietary instructions, activity restrictions, and follow up appointments.   Follow-Up Appointments: Follow-up Information    Follow up with Arvilla Meres, MD On 11/28/2014.   Specialty:  Cardiology   Why:  At 9:20 Garage Code [id number]    Contact information:   [address] [place] [address] [phone]       Follow up with Lora Paula, MD. Schedule an appointment as soon as possible for a visit on 11/21/2014.   Specialty:  Family Medicine   Why:  Please follow up on Monday    Contact information:   [address] [place] [address] [phone]       Casey Burkitt, MD 11/17/2014, 9:51 PM PGY-1, [facility]

## 2014-11-16 NOTE — Progress Notes (Signed)
Family Medicine Teaching Service Daily Progress Note Intern Pager: [phone]  Patient name: Robert Booth Medical record number: [id number] Date of birth: 08/25/1970 Age: 44 y.o. Gender: male  Primary Care Provider: Lora Paula, MD Consultants: Cardiology, Pulmonology Code Status: FULL  Pt Overview and Major Events to Date:  - 7/13-7/19/16: Previous hospitalization for hemoptysis treated as HCAP. - 11/11/14: ECHO with LVEF 5-10% (decreased from 20% on 10/13/14) - 11/11/14: Transferred to Cardiac SDU/ICU - 11/11/14: Central line placed, milrinone and norepinephrine started - 11/13/14: LE swelling resolved, negative for DVT via doppler  - 11/13/14: norepinephrine discontinued. Low dose Bidil, digoxin and spironolactone started 8/14.  - 11/15/14: milrinone discontinued  Assessment and Plan: Robert Booth is a 44 y.o. male presenting with cough, SOB, hemoptysis and pleuritic chest pain. PMH is significant for HTN, dilated cardiomyopathy (NICM, reduced EF), alcoholism, and anxiety. Suspect pulmonary embolism given tachycardia, SOB, subtherapeutic INR on admission on coumadin, known LV thrombus, and positive D-dimer. BNP elevated to 1689.6.Was 1091.1 at last admission. Acute on chronic CHF exacerbation with cardiogenic and hepatic shock. AKI and hepatic shock now resolved.    # HFrEF: Now in cardiogenic shock. Weight down 10 lb from admission after diuresis. Net negative 0.8 L from 8/16-8/17. Weight down 2 lbs since yesterday. No crackles on exam. - Heart Failure team consulted and following, appreciate recommendations.  - Milrinone discontinued 11/15/14.  - Holding lasix 40 mg PO today, per CHF team recommendations - Continue bidil to 1 tab TID  - Continue digoxin 0.25 mg daily. Level remains nontoxic at 0.2 (11/16/14). Level was 0.8 (11/15/14).  - Continue spironolactone 25 mg daily. Following potassium. - Entresto BID started 11/15/14. - Continue daily weights and strict I/Os - Trending  carboxyhemoglobin. O2 saturation has been above 60% but marginal at 56.6% this a.m.  - Cards indicating may be transplant candidate if remain abstinent from ETOH. B+ blood type.  - Nutrition has counseled patient on heart healthy diet.   # Anticoagulation: INR: 1.39 << 1.53 < 1.37 < 1.94 <  3.95 < 3.29 < 2.62 < 1.72. Denies any hemoptysis at this point. Previously elevated due to shock liver. - Goal INR is 2.0-3.0.   - Continue heparin gtt to bridge coumadin. Pharmacy managing.  - Plts 434  # HCAP: Patient afebrile. WBC increasing 16.2 >> 15.7 (11/15/14) > 13.0 11/14/14, up from 10.4 8/14. Possibly due to hemoconcentration secondary to diuresis. No longer trending procalcitonin. Clinical picture most consistent with CHF exacerbation rather than infection. Not currently meeting SIRS criteria. Blood cx NGTD. Completed antibiotics . Received vancomycin (2.5 days) along with ceftazidime (4.5 days), and 3 days of levaquin.   - Completed 7 day course of antibiotics 11/15/14.  - CXR 11/15/14 shows interim partial clearing of right mid lung and right lung base infiltrates. - Monitor WBC and vitals.   # Hemoptysis: Per patient, resolved as of 11/12/14. V/Q scan intermediate risk. Hemoglobin 12.7 << 11.6 < 11.1 < 11.5 < 10.9 < 12.1 < 12.9. Negative ANA and ANCA titers.  - Hemoglobin stable.  - Pharmacy consulting for anticoag management. Continue heparin gtt with goal level of 0.3-0.7.  - Coumadin restarted 8/14, per cards. Pharmacy managing. Continue to bridge.  - Pulmonology consulted, appreciate recommendations. Low suspicion for mass. No need for bronchoscopy at this time.  # Hepatic Failure: Improving. AST 318 << 1751 < 2616 < 1290, ALT 640 << 1213 < 1251 < 697. LE dopplers showed pulsatile venous flow suggestive of right-sided heart pressure, concerning for hepatic  congestion. RUQ U/S negative for ascites. Neg Hepatitis panel. Acetaminophen level normal.  - MELD score 10 (calculated 11/14/14) compared to  34 on 10/1214.   # AKI: Resolved. 0.99 << 1.04 < 1.05 < 2.17 < 1.22 (1.22 in July) Suspect AKI 2/2 hypoperfusion in setting of cardiac shock. Urine output high on lasix. Salicylate level normal. Normal renal U/S. UA positive for granular casts, consistent with ATN. UDS positive for opiates (received during hospitalization) - Nephrology signed off  - AntiGBM AB negative.   #Hx of LV thrombus - Continue anticoagulation.   #HTN: Continues to be normotensive - Continue home metoprolol.   # Chest Pain: Resolved right-sided chest pain. Troponins trended and peaked at 0.09.   # Nausea: Resolved.  # H/o Anxiety: not currently medically treated  # Alcohol Abuse: Denies ETOH use (states last drink was in February) - CIWA protocol.   FEN/GI: heart healthy diet  PPx: protonix 40 mg daily  Disposition: Admitted to the [facility] on telemetry.   Subjective:  Patient feeling well and eager to get home. He has no complaints and feels he is breathing well. He was able to walk well without need to rest with the cardiac rehab exercise physiologist yesterday (11/15/14).   Objective: Temp:  [98 F (36.7 C)-98.2 F (36.8 C)] 98 F (36.7 C) (08/17 0409) Pulse Rate:  [95-109] 95 (08/16 2215) Resp:  [16-18] 18 (08/17 0407) BP: (111-116)/(67-79) 116/75 mmHg (08/17 0407) SpO2:  [100 %] 100 % (08/17 0409) Weight:  [179 lb (81.194 kg)] 179 lb (81.194 kg) (08/17 0409) Physical Exam: General: Awake, sitting up in chair, waiting for breakfast HEENT: Mucus membranes moist, central line still in place Cardiovascular: Tachycardic. +S3 Respiratory: CTAB, no crackles  Abdomen: Soft, non-tender, nondistended, positive BS.  Extremities: No pitting edema. DP pulses 2+ bilaterally.   Neuro: AOx3. Asks appropriate questions.  Skin: WWP.    Laboratory:  Recent Labs Lab 11/13/14 0321 11/14/14 0415 11/15/14 0440  WBC 10.4 13.0* 15.7*  HGB 11.5* 11.1* 11.6*  HCT 32.5* 32.5* 33.6*   PLT 294 326 380    Recent Labs Lab 11/11/14 0223 11/12/14 0425 11/13/14 0321 11/14/14 0415 11/15/14 0440  NA 132* 131* 136 136 133*  K 4.5 3.0* 3.2* 4.4 4.2  CL 95* 94* 95* 100* 99*  CO2 25 27 32 28 28  BUN 43* 40* 23* 12 9  CREATININE 2.41* 2.17* 1.22 1.05 1.04  CALCIUM 8.8* 8.1* 8.1* 8.3* 8.6*  PROT 6.4* 6.2*  --  6.3*  --   BILITOT 3.5* 5.3*  --  2.3*  --   ALKPHOS 73 71  --  76  --   ALT 1251* 1213*  --  640*  --   AST 2616* 1751*  --  318*  --   GLUCOSE 95 107* 111* 95 101*    Imaging/Diagnostic Tests:  Dg Chest 2 View  11/15/2014   CLINICAL DATA:  Congestive heart failure. Community acquired pneumonia .  EXAM: CHEST  2 VIEW  COMPARISON:  11/12/2014.  CT 10/12/2014.  FINDINGS: Right IJ line in stable position. Partial resolution of rounded infiltrates in the right mid lung and right base. Continued close follow-up chest x-rays suggested to exclude underlying mass lesion. No significant pleural effusion. No pneumothorax. Heart size normal. No acute bony abnormality.  IMPRESSION: 1. Right IJ line in stable position. 2. Interim partial clearing of right mid lung and right lung base rounded infiltrates. Continued close follow-up chest x-rays are suggested to demonstrate complete  clearing to exclude underlying mass lesion   Electronically Signed   By: Maisie Fus  Register   On: 11/15/2014 07:47   Dg Chest 2 View  11/09/2014   CLINICAL DATA:  Recent pneumonia  EXAM: CHEST  2 VIEW  COMPARISON:  10/13/2014  FINDINGS: Borderline cardiomegaly. There is new nodular consolidation right perihilar region. Persistent infiltrate/ consolidation right lower lobe. Findings suspicious for recurrent multifocal pneumonia. No pulmonary edema. Follow-up to resolution after appropriate treatment recommended.  IMPRESSION: There is new nodular consolidation right perihilar region. Persistent infiltrate/ consolidation right lower lobe. Findings suspicious for recurrent multifocal pneumonia. No pulmonary edema.  Follow-up to resolution after appropriate treatment recommended.   Electronically Signed   By: Natasha Mead M.D.   On: 11/09/2014 08:08   US Renal  11/10/2014   CLINICAL DATA:  Acute kidney injury  EXAM: RENAL / URINARY TRACT ULTRASOUND COMPLETE  COMPARISON:  None.  FINDINGS: Right Kidney:  Length: 11.7 cm. Echogenicity within normal limits. No mass or hydronephrosis visualized.  Left Kidney:  Length: 13.1 cm. Echogenicity within normal limits. No mass or hydronephrosis visualized.  Bladder:  Bladder is decompressed and therefore not evaluated.  IMPRESSION: Kidneys appear normal.  Bladder not evaluated.   Electronically Signed   By: Esperanza Heir M.D.   On: 11/10/2014 16:54   Nm Pulmonary Perf And Vent  11/10/2014   CLINICAL DATA:  Shortness of breath, chest pain, hemoptysis, history hypertension, non ischemic cardiomyopathy, former smoker  EXAM: NUCLEAR MEDICINE VENTILATION - PERFUSION LUNG SCAN  TECHNIQUE: Ventilation images were obtained in multiple projections using inhaled aerosol Tc-84m DTPA. Perfusion images were obtained in multiple projections after intravenous injection of Tc-75m MAA.  RADIOPHARMACEUTICALS:  39.0 mCi Technetium-59m DTPA aerosol inhalation and 5.9 mCi Technetium-71m MAA IV  COMPARISON:  None  Correlation: Chest radiograph 11/09/2014  FINDINGS: Ventilation: Impaired ventilation identified in RIGHT middle and RIGHT lower lobes. RIGHT lower lobe absent perfusion corresponds to area of radiographic abnormality on preceding chest radiograph and recent CT. No abnormalities at RIGHT middle lobe on recent chest radiograph. Heart appears enlarged.  Perfusion: Matching diminished perfusion involving the RIGHT middle and RIGHT lower lobes. No additional areas of segmental or subsegmental perfusion decrease. Heart appears enlarged.  Chest radiograph: Significant RIGHT lower lobe infiltrate. No definite RIGHT middle lobe infiltrate.  IMPRESSION: Matching decreased ventilation, decreased perfusion,  and radiographic abnormalities in RIGHT lower lobe.  Matching decreased subsegmental ventilation and perfusion abnormalities in RIGHT middle lobe with clear chest radiograph.  No perfusion abnormalities of the LEFT lung.  Findings represent an intermediate probability of pulmonary embolism.   Electronically Signed   By: Ulyses Southward M.D.   On: 11/10/2014 15:37   Dg Chest Port 1 View  11/12/2014   CLINICAL DATA:  Respiratory failure  EXAM: PORTABLE CHEST - 1 VIEW  COMPARISON:  11/11/2014  FINDINGS: Persistent airspace disease in the right lower lobe. Trace right pleural effusion. Hazy left lower lobe airspace disease. No pneumothorax. Stable cardiomediastinal silhouette. Right jugular central venous catheter with the tip projecting over the SVC.  No acute osseous abnormality.  IMPRESSION: 1. Persistent right lower lobe pneumonia. No significant interval change. Followup PA and lateral chest X-ray is recommended in 3-4 weeks following trial of antibiotic therapy to ensure resolution and exclude underlying malignancy. 2. Hazy left lower lobe airspace disease which may reflect atelectasis versus developing pneumonia.   Electronically Signed   By: Elige Ko   On: 11/12/2014 08:19   Dg Chest Port 1 View  11/11/2014  CLINICAL DATA:  Central line insertion.  EXAM: PORTABLE CHEST - 1 VIEW  COMPARISON:  Two-view chest x-ray 11/09/2014. CT of the chest 10/12/2014.  FINDINGS: The heart is enlarged. Any right IJ line is been placed. The tip projects over the right SVC, above the cavoatrial junction.  Progressive airspace consolidation is present in the right lower lobe. A right pleural effusion is suspected. The left lung is clear. Lung volumes are low.  IMPRESSION: 1. Interval placement of right IJ line without complication. The tip is in the mid SVC. 2. Stable cardiomegaly. 3. Progressive airspace consolidation in the right lower lobe.   Electronically Signed   By: Marin Roberts M.D.   On: 11/11/2014 20:53    US Abdomen Limited Ruq  11/11/2014   CLINICAL DATA:  Elevated liver function tests.  EXAM: US ABDOMEN LIMITED - RIGHT UPPER QUADRANT  COMPARISON:  None.  FINDINGS: Gallbladder:  No gallstones are identified. The gallbladder is incompletely distended. The wall is thickened at 0.5 cm. No pericholecystic fluid. Sonographer reports negative Murphy's sign.  Common bile duct:  Diameter: 0.4 cm.  Liver:  No focal lesion identified. The liver demonstrates mildly increased echogenicity. The portal vein is patent.  IMPRESSION: Gallbladder wall thickening without stones or pericholecystic fluid may be due to underdistention or hypoproteinemia. No ascites is identified.  Mildly increased echogenicity of the liver consistent with fatty infiltration.   Electronically Signed   By: Drusilla Kanner M.D.   On: 11/11/2014 11:11    Casey Burkitt, MD 11/16/2014, 6:59 AM PGY-1, [facility] Family Medicine FPTS Intern pager: [phone], text pages welcome

## 2014-11-16 NOTE — Discharge Instructions (Addendum)
You were admitted to [facility] for Heart Failure.  Your heart's current ejection fraction is  5% to 10%.  Cardiology has changed your medications which are listed below Lasix 40 mg daily Entresto 24-26 mg twice a day Bidil 1 tab three times daily  Digoxin 0.25 mg daily Spirolactone 25 mg daily  You will also need to take heparin injections for the next 5 days as your INR is not currently therapeutic on Warfarin.  Medications include  - Lovenox 120 mg- one syringe, once a day in your abdomen, subcutaneous tissue, follow per nursing instructions given in the hospital for 5 days  - Warfarin 7.5 mg daily   YOU MUST SEE YOUR PRIMARY PHYSICIAN ON Monday TO GET YOUR INR RECHECKED.  You will also need to follow up with your cardiologists, appointment has been made. \  Please Return For  - Worsening shortness of breath or chest pain   Please contact your primary care physician  - if fevers greater 101.5  - for excessive bleeding or inability to stop a bleed.    Information on my medicine - Coumadin   (Warfarin)  This medication education was reviewed with me or my healthcare representative as part of my discharge preparation. Why was Coumadin prescribed for you? Coumadin was prescribed for you because you have a blood clot or a medical condition that can cause an increased risk of forming blood clots. Blood clots can cause serious health problems by blocking the flow of blood to the heart, lung, or brain. Coumadin can prevent harmful blood clots from forming. As a reminder your indication for Coumadin is:   History of a clot in your heart  What test will check on my response to Coumadin? While on Coumadin (warfarin) you will need to have an INR test regularly to ensure that your dose is keeping you in the desired range. The INR (international normalized ratio) number is calculated from the result of the laboratory test called prothrombin time (PT).  If an INR APPOINTMENT HAS NOT ALREADY  BEEN MADE FOR YOU please schedule an appointment to have this lab work done by your health care provider within 7 days. Your INR goal is usually a number between:  2 to 3  What  do you need to  know  About  COUMADIN? Take Coumadin (warfarin) exactly as prescribed by your healthcare provider about the same time each day.  DO NOT stop taking without talking to the doctor who prescribed the medication.  Stopping without other blood clot prevention medication to take the place of Coumadin may increase your risk of developing a new clot or stroke.  Get refills before you run out.  What do you do if you miss a dose? If you miss a dose, take it as soon as you remember on the same day then continue your regularly scheduled regimen the next day.  Do not take two doses of Coumadin at the same time.  Important Safety Information A possible side effect of Coumadin (Warfarin) is an increased risk of bleeding. You should call your healthcare provider right away if you experience any of the following: ? Bleeding from an injury or your nose that does not stop. ? Unusual colored urine (red or dark brown) or unusual colored stools (red or black). ? Unusual bruising for unknown reasons. ? A serious fall or if you hit your head (even if there is no bleeding).  Some foods or medicines interact with Coumadin (warfarin) and might alter your  response to warfarin. To help avoid this: ? Eat a balanced diet, maintaining a consistent amount of Vitamin K. ? Notify your provider about major diet changes you plan to make. ? Avoid alcohol or limit your intake to 1 drink for women and 2 drinks for men per day. (1 drink is 5 oz. wine, 12 oz. beer, or 1.5 oz. liquor.)  Make sure that ANY health care provider who prescribes medication for you knows that you are taking Coumadin (warfarin).  Also make sure the healthcare provider who is monitoring your Coumadin knows when you have started a new medication including herbals and  non-prescription products.  Coumadin (Warfarin)  Major Drug Interactions  Increased Warfarin Effect Decreased Warfarin Effect  Alcohol (large quantities) Antibiotics (esp. Septra/Bactrim, Flagyl, Cipro) Amiodarone (Cordarone) Aspirin (ASA) Cimetidine (Tagamet) Megestrol (Megace) NSAIDs (ibuprofen, naproxen, etc.) Piroxicam (Feldene) Propafenone (Rythmol SR) Propranolol (Inderal) Isoniazid (INH) Posaconazole (Noxafil) Barbiturates (Phenobarbital) Carbamazepine (Tegretol) Chlordiazepoxide (Librium) Cholestyramine (Questran) Griseofulvin Oral Contraceptives Rifampin Sucralfate (Carafate) Vitamin K   Coumadin (Warfarin) Major Herbal Interactions  Increased Warfarin Effect Decreased Warfarin Effect  Garlic Ginseng Ginkgo biloba Coenzyme Q10 Green tea St. Johns wort    Coumadin (Warfarin) FOOD Interactions  Eat a consistent number of servings per week of foods HIGH in Vitamin K (1 serving =  cup)  Collards (cooked, or boiled & drained) Kale (cooked, or boiled & drained) Mustard greens (cooked, or boiled & drained) Parsley *serving size only =  cup Spinach (cooked, or boiled & drained) Swiss chard (cooked, or boiled & drained) Turnip greens (cooked, or boiled & drained)  Eat a consistent number of servings per week of foods MEDIUM-HIGH in Vitamin K (1 serving = 1 cup)  Asparagus (cooked, or boiled & drained) Broccoli (cooked, boiled & drained, or raw & chopped) Brussel sprouts (cooked, or boiled & drained) *serving size only =  cup Lettuce, raw (green leaf, endive, romaine) Spinach, raw Turnip greens, raw & chopped   These websites have more information on Coumadin (warfarin):  http://www.king-russell.com/; https://www.hines.net/;    Heart Failure Heart failure means your heart has trouble pumping blood. This makes it hard for your body to work well. Heart failure is usually a long-term (chronic) condition. You must take good care of yourself and follow your  doctor's treatment plan. HOME CARE  Take your heart medicine as told by your doctor.  Do not stop taking medicine unless your doctor tells you to.  Do not skip any dose of medicine.  Refill your medicines before they run out.  Take other medicines only as told by your doctor or pharmacist.  Stay active if told by your doctor. The elderly and people with severe heart failure should talk with a doctor about physical activity.  Eat heart-healthy foods. Choose foods that are without trans fat and are low in saturated fat, cholesterol, and salt (sodium). This includes fresh or frozen fruits and vegetables, fish, lean meats, fat-free or low-fat dairy foods, whole grains, and high-fiber foods. Lentils and dried peas and beans (legumes) are also good choices.  Limit salt if told by your doctor.  Cook in a healthy way. Roast, grill, broil, bake, poach, steam, or stir-fry foods.  Limit fluids as told by your doctor.  Weigh yourself every morning. Do this after you pee (urinate) and before you eat breakfast. Write down your weight to give to your doctor.  Take your blood pressure and write it down if your doctor tells you to.  Ask your doctor how to check your  pulse. Check your pulse as told.  Lose weight if told by your doctor.  Stop smoking or chewing tobacco. Do not use gum or patches that help you quit without your doctor's approval.  Schedule and go to doctor visits as told.  Nonpregnant women should have no more than 1 drink a day. Men should have no more than 2 drinks a day. Talk to your doctor about drinking alcohol.  Stop illegal drug use.  Stay current with shots (immunizations).  Manage your health conditions as told by your doctor.  Learn to manage your stress.  Rest when you are tired.  If it is really hot outside:  Avoid intense activities.  Use air conditioning or fans, or get in a cooler place.  Avoid caffeine and alcohol.  Wear loose-fitting, lightweight,  and light-colored clothing.  If it is really cold outside:  Avoid intense activities.  Layer your clothing.  Wear mittens or gloves, a hat, and a scarf when going outside.  Avoid alcohol.  Learn about heart failure and get support as needed.  Get help to maintain or improve your quality of life and your ability to care for yourself as needed. GET HELP IF:   You gain 03 lb/1.4 kg or more in 1 day or 05 lb/2.3 kg in a week.  You are more short of breath than usual.  You cannot do your normal activities.  You tire easily.  You cough more than normal, especially with activity.  You have any or more puffiness (swelling) in areas such as your hands, feet, ankles, or belly (abdomen).  You cannot sleep because it is hard to breathe.  You feel like your heart is beating fast (palpitations).  You get dizzy or light-headed when you stand up. GET HELP RIGHT AWAY IF:   You have trouble breathing.  There is a change in mental status, such as becoming less alert or not being able to focus.  You have chest pain or discomfort.  You faint. MAKE SURE YOU:   Understand these instructions.  Will watch your condition.  Will get help right away if you are not doing well or get worse. Document Released: 12/26/2007 Document Revised: 08/02/2013 Document Reviewed: 05/04/2012 [facility] Patient Information 2015 [place], [place]. This information is not intended to replace advice given to you by your health care provider. Make sure you discuss any questions you have with your health care provider.

## 2014-11-16 NOTE — Progress Notes (Signed)
ANTICOAGULATION CONSULT NOTE - Follow Up Consult  Pharmacy Consult for Heparin and Coumadin Indication: hx LV thrombus, probable PE  Allergies  Allergen Reactions  . Celexa [Citalopram Hydrobromide] Other (See Comments)    DISORIENTED  . Lisinopril Cough    Patient Measurements: Height: 5\' 9"  (175.3 cm) Weight: 179 lb (81.194 kg) IBW/kg (Calculated) : 70.7  Vital Signs: Temp: 97.7 F (36.5 C) (08/17 0715) Temp Source: Oral (08/17 0715) BP: 121/79 mmHg (08/17 0715) Pulse Rate: 110 (08/17 1020)  Labs:  Recent Labs  11/14/14 0415 11/15/14 0440 11/15/14 1215 11/15/14 2250 11/16/14 0700  HGB 11.1* 11.6*  --   --  12.7*  HCT 32.5* 33.6*  --   --  36.7*  PLT 326 380  --   --  434*  LABPROT 17.0* 18.4*  --   --  17.2*  INR 1.37 1.53*  --   --  1.39  HEPARINUNFRC 0.46 0.97* 0.88* 0.69 0.60  CREATININE 1.05 1.04  --   --  0.99    Estimated Creatinine Clearance: 95.2 mL/min (by C-G formula based on Cr of 0.99).     Assessment: 43yom on coumadin pta for hx LV thrombus, admitted with hemoptysis and subtherapeutic INR. Heparin bridge was started. D-dimer came back elevated and VQ scan showed intermediate probability for PE. INR trended up to 3.95 yesterday (due to shock liver) and heparin was held. No reversal agents were given.  INR back down to 1.9 and heparin resumed 8/14. Heparin drip 1450 uts/hr, heparin level therapeutic 0.6 Coumadin to also resume 8/14 (last dose given was 8/9) INR 1.9 > restarted 8/14 INR 1.4 will give boost Hemoptysis resolved. CBC stable.  Home coumadin dose: 7.5mg  daily  Goal of Therapy:  Heparin level 0.3-0.7 INR 2-3 Monitor platelets by anticoagulation protocol: Yes   Plan:  1) Continue heparin 1450 uts/hr 2) Coumadin 11.25 mg x 1 3) Daily heparin level, INR, CBC   Leota Sauers Pharm.D. CPP, BCPS Clinical Pharmacist [phone] 11/16/2014 10:54 AM

## 2014-11-16 NOTE — Progress Notes (Signed)
Advanced Heart Failure Rounding Note  Primary Cardiologist:   Reason for referral: A/C systolic HF EF 5-10%  Subjective:    Robert Booth is a 44 y.o. male with hx of systolic HF with NICM secondary to prolonged ETOH abuse, EF 5-10% by Echo 11/11/14, and CKD stage 2.  He presented to [facility] with hemoptysis. RLL + cardiogenic shock. Placed on antibiotics. CO-OX 27%. Required short term norepi.   Yesterday milrinone stopped and entresto started. CO-OX marginal 56%.   Denies SOB/Orthopnea.    Echo 11/11/14 EF 5-10%   Objective:   Weight Range: 179 lb (81.194 kg) Body mass index is 26.42 kg/(m^2).   Vital Signs:   Temp:  [97.7 F (36.5 C)-98.2 F (36.8 C)] 97.7 F (36.5 C) (08/17 0715) Pulse Rate:  [95-109] 95 (08/16 2215) Resp:  [16-18] 18 (08/17 0715) BP: (111-121)/(67-79) 121/79 mmHg (08/17 0715) SpO2:  [100 %] 100 % (08/17 0715) Weight:  [179 lb (81.194 kg)] 179 lb (81.194 kg) (08/17 0409) Last BM Date: 11/14/14  Weight change: Filed Weights   11/14/14 0314 11/15/14 0433 11/16/14 0409  Weight: 179 lb 12.8 oz (81.557 kg) 181 lb 10.5 oz (82.4 kg) 179 lb (81.194 kg)    Intake/Output:   Intake/Output Summary (Last 24 hours) at 11/16/14 0720 Last data filed at 11/16/14 0500  Gross per 24 hour  Intake 1058.85 ml  Output   1950 ml  Net -891.15 ml     Physical Exam: CVP 3  General: NAD. Sitting in chair. Marland Kitchen Neuro: Alert and oriented X 3. Moves all extremities spontaneously. Psych: Normal affect. HEENT: Normal Neck: RIJ TLC. Supple without bruits. JVP flat with prominent CV waves Lungs: Resp regular and unlabored, CTA. Heart: PMI laterally displaced RRR s3, s4, or murmurs. +S3 Abdomen: Soft, non-tender, non-distended, BS + x 4.  Extremities: No clubbing, cyanosis. DP/PT/Radials 2+ and equal bilaterally. No edema.   Telemetry: Sinus tach 100s  Labs: CBC  Recent Labs  11/14/14 0415 11/15/14 0440  WBC 13.0* 15.7*  HGB 11.1* 11.6*  HCT 32.5* 33.6*   MCV 85.1 86.2  PLT 326 380   Basic Metabolic Panel  Recent Labs  11/14/14 0415 11/15/14 0440  NA 136 133*  K 4.4 4.2  CL 100* 99*  CO2 28 28  GLUCOSE 95 101*  BUN 12 9  CALCIUM 8.3* 8.6*  MG 1.7  --    Liver Function Tests  Recent Labs  11/14/14 0415  AST 318*  ALT 640*  ALKPHOS 76  BILITOT 2.3*  PROT 6.3*  ALBUMIN 2.4*   No results for input(s): LIPASE, AMYLASE in the last 72 hours. Cardiac Enzymes No results for input(s): CKTOTAL, CKMB, CKMBINDEX, TROPONINI in the last 72 hours.  BNP: BNP (last 3 results)  Recent Labs  10/12/14 1710 10/17/14 0438 11/09/14 0820  BNP 879.0* 1091.1* 1689.6*    ProBNP (last 3 results) No results for input(s): PROBNP in the last 8760 hours.   D-Dimer No results for input(s): DDIMER in the last 72 hours. Hemoglobin A1C No results for input(s): HGBA1C in the last 72 hours. Fasting Lipid Panel No results for input(s): CHOL, HDL, LDLCALC, TRIG, CHOLHDL, LDLDIRECT in the last 72 hours. Thyroid Function Tests No results for input(s): TSH, T4TOTAL, T3FREE, THYROIDAB in the last 72 hours.  Invalid input(s): FREET3  Other results:     Imaging/Studies:  Dg Chest 2 View  11/15/2014   CLINICAL DATA:  Congestive heart failure. Community acquired pneumonia .  EXAM: CHEST  2 VIEW  COMPARISON:  11/12/2014.  CT 10/12/2014.  FINDINGS: Right IJ line in stable position. Partial resolution of rounded infiltrates in the right mid lung and right base. Continued close follow-up chest x-rays suggested to exclude underlying mass lesion. No significant pleural effusion. No pneumothorax. Heart size normal. No acute bony abnormality.  IMPRESSION: 1. Right IJ line in stable position. 2. Interim partial clearing of right mid lung and right lung base rounded infiltrates. Continued close follow-up chest x-rays are suggested to demonstrate complete clearing to exclude underlying mass lesion   Electronically Signed   By: Maisie Fus  Register   On:  11/15/2014 07:47    Latest Echo  Latest Cath   Medications:     Scheduled Medications: . antiseptic oral rinse  7 mL Mouth Rinse BID  . digoxin  0.25 mg Oral Daily  . folic acid  1 mg Oral Daily  . furosemide  40 mg Oral Daily  . isosorbide-hydrALAZINE  1 tablet Oral TID  . multivitamin with minerals  1 tablet Oral Daily  . pantoprazole  40 mg Oral Daily  . sacubitril-valsartan  1 tablet Oral BID  . sodium chloride  3 mL Intravenous Q12H  . spironolactone  25 mg Oral Daily  . thiamine  100 mg Oral Daily   Or  . thiamine  100 mg Intravenous Daily  . Warfarin - Pharmacist Dosing Inpatient   Does not apply q1800    Infusions: . heparin 1,450 Units/hr (11/16/14 0500)    PRN Medications: benzonatate, polyethylene glycol   Assessment/Plan   1. Acute on chronic systolic CHF with NICM, -> cardiogenic shock 2. AKI on CKD stage 2 3. Elevated troponin 4. ETOH abuse quit February 2016 5. Acute renal failure 6. Shock liver 7. Acute respiratory failure 8. PNA with hemoptysis 9. NSVT 10. 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 Blood Type  B+   Admitted with profound cardiogenic shock in setting of severe ETOH cardiomyopathy (EF 10%) and RLL PNA.   Milrinone stopped yesterday. CO-OX 56%. Keep off milrinone for now. Asymptomatic.  No bb with low output. Continue dig 0.25 mg daily. Dig level 0.8.  Volume status low.  CVP. Continue to hold lasix. Continue Bidil 20/37.5 mg tid. On low dose entresto. BMET pending. (has orange card for meds).   RLL PNA. Blood cultures negative. WBC trending up. Afebrile. Day 7 antibiotics per primary team. UA ok.   INR  Pending. Coumadin per pharmacy.    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 May be transplant candidate if continues abstinence from ETOH.   Cardiac rehab and dietitian input appreciated. Marland Kitchen    Booth,AMY, NP-C   7:20 AM  Advanced Heart Failure Team Pager [phone] (M-F; 7a - 4p)  Please contact  [facility] for night-coverage after hours (4p -7a ) and weekends on amion.  Patient seen and examined with Robert Becket, NP. We discussed all aspects of the encounter. I agree with the assessment and plan as stated above.   Much, much improved. Co-ox marginal now. Will watch one more day prior to discharge. Continue Bidil and Entresto.Resume lasix tomorrow. If co-ox >= 55% tomorrow can go home.   Robert Mroczka,MD 1:32 PM

## 2014-11-17 LAB — CBC
HCT: 33.9 % — ABNORMAL LOW (ref 39.0–52.0)
Hemoglobin: 12 g/dL — ABNORMAL LOW (ref 13.0–17.0)
MCH: 29.9 pg (ref 26.0–34.0)
MCHC: 35.4 g/dL (ref 30.0–36.0)
MCV: 84.5 fL (ref 78.0–100.0)
Platelets: 389 10*3/uL (ref 150–400)
RBC: 4.01 MIL/uL — ABNORMAL LOW (ref 4.22–5.81)
RDW: 15.9 % — ABNORMAL HIGH (ref 11.5–15.5)
WBC: 15.3 10*3/uL — ABNORMAL HIGH (ref 4.0–10.5)

## 2014-11-17 LAB — BASIC METABOLIC PANEL
Anion gap: 8 (ref 5–15)
BUN: 7 mg/dL (ref 6–20)
CO2: 26 mmol/L (ref 22–32)
Calcium: 8.9 mg/dL (ref 8.9–10.3)
Chloride: 104 mmol/L (ref 101–111)
Creatinine, Ser: 0.94 mg/dL (ref 0.61–1.24)
GFR calc Af Amer: >60 mL/min (ref >60)
GFR calc non Af Amer: >60 mL/min (ref >60)
Glucose, Bld: 88 mg/dL (ref 65–99)
Potassium: 4.2 mmol/L (ref 3.5–5.1)
Sodium: 138 mmol/L (ref 135–145)

## 2014-11-17 LAB — PROTIME-INR
INR: 1.6 — ABNORMAL HIGH (ref 0.00–1.49)
PROTHROMBIN TIME: 19.1 s — AB (ref 11.6–15.2)

## 2014-11-17 LAB — CARBOXYHEMOGLOBIN
Carboxyhemoglobin: 1.4 % (ref 0.5–1.5)
Methemoglobin: 0.7 % (ref 0.0–1.5)
O2 Saturation: 61.9 %
Total hemoglobin: 15.4 g/dL (ref 13.5–18.0)

## 2014-11-17 LAB — HEPARIN LEVEL (UNFRACTIONATED): Heparin Unfractionated: 0.46 IU/mL (ref 0.30–0.70)

## 2014-11-17 MED ORDER — DIGOXIN 250 MCG PO TABS: 0.2500 mg | ORAL_TABLET | Freq: Every day | ORAL | Status: DC

## 2014-11-17 MED ORDER — ISOSORB DINITRATE-HYDRALAZINE 20-37.5 MG PO TABS: 1.0000 | ORAL_TABLET | Freq: Three times a day (TID) | ORAL | Status: DC

## 2014-11-17 MED ORDER — FUROSEMIDE 40 MG PO TABS: 40.0000 mg | ORAL_TABLET | Freq: Every day | ORAL | Status: DC

## 2014-11-17 MED ORDER — ENOXAPARIN SODIUM 120 MG/0.8ML ~~LOC~~ SOLN: 120.0000 mg | SUBCUTANEOUS | Status: DC

## 2014-11-17 MED ORDER — FUROSEMIDE 40 MG PO TABS
40.0000 mg | ORAL_TABLET | Freq: Every day | ORAL | Status: DC
  Administered 2014-11-17: 40 mg via ORAL

## 2014-11-17 MED ORDER — SACUBITRIL-VALSARTAN 24-26 MG PO TABS: 1.0000 | ORAL_TABLET | Freq: Two times a day (BID) | ORAL | Status: DC

## 2014-11-17 MED ORDER — ENOXAPARIN SODIUM 120 MG/0.8ML ~~LOC~~ SOLN
120.0000 mg | Freq: Once | SUBCUTANEOUS | Status: AC
  Administered 2014-11-17: 120 mg via SUBCUTANEOUS
  Filled 2014-11-17: qty 0.8

## 2014-11-17 MED ORDER — SPIRONOLACTONE 25 MG PO TABS: 25.0000 mg | ORAL_TABLET | Freq: Every day | ORAL | Status: DC

## 2014-11-17 NOTE — Progress Notes (Signed)
[facility] has lovenox 100mg  syringes in stock. They will have to teach him to expell excess out of 1 syringe. [facility] not [facility] entresto so pt [facility] [address] for 30day free of entresto. All other [facility]s [facility]. Spoke w dr Aniceto Boss to let her know to send [facility] prescripiton [facility] other [facility]. Pt in agreement w plan and has activated his 30day free entresto card.

## 2014-11-17 NOTE — Progress Notes (Signed)
Pt with a few more sodium questions. I answered these and took BP, which was 124/79, HR 107 ST at rest. Sent pt out to walk on his own. Sts he feels good. Good understanding. [phone] Ethelda Chick CES, ACSM 11:52 AM 11/17/2014

## 2014-11-17 NOTE — Progress Notes (Signed)
Advanced Heart Failure Rounding Note  Primary Cardiologist:   Reason for referral: A/C systolic HF EF 5-10%  Subjective:    Robert Booth is a 44 y.o. male with hx of systolic HF with NICM secondary to prolonged ETOH abuse, EF 5-10% by Echo 11/11/14, and CKD stage 2.  He presented to [facility] with hemoptysis. RLL + cardiogenic shock. Placed on antibiotics. CO-OX 27%. Required short term norepi.   Stable off milrinone .  CO-OX 62%.    Denies SOB/Orthopnea.    Echo 11/11/14 EF 5-10%   Objective:   Weight Range: 181 lb (82.1 kg) Body mass index is 26.72 kg/(m^2).   Vital Signs:   Temp:  [97.3 F (36.3 C)-98 F (36.7 C)] 97.6 F (36.4 C) (08/18 0445) Pulse Rate:  [93-110] 97 (08/18 0445) Resp:  [14-16] 16 (08/18 0445) BP: (86-117)/(45-78) 115/67 mmHg (08/18 0445) SpO2:  [97 %-99 %] 97 % (08/18 0445) Weight:  [181 lb (82.1 kg)] 181 lb (82.1 kg) (08/18 0445) Last BM Date: 11/16/14  Weight change: Filed Weights   11/15/14 0433 11/16/14 0409 11/17/14 0445  Weight: 181 lb 10.5 oz (82.4 kg) 179 lb (81.194 kg) 181 lb (82.1 kg)    Intake/Output:   Intake/Output Summary (Last 24 hours) at 11/17/14 0717 Last data filed at 11/17/14 0700  Gross per 24 hour  Intake    588 ml  Output   1300 ml  Net   -712 ml     Physical Exam: CVP 3  General: NAD. Sitting in chair. Marland Kitchen Neuro: Alert and oriented X 3. Moves all extremities spontaneously. Psych: Normal affect. HEENT: Normal Neck: RIJ TLC. Supple without bruits. JVP flat with prominent CV waves Lungs: Resp regular and unlabored, CTA. Heart: PMI laterally displaced RRR s3, s4, or murmurs. +S3 Abdomen: Soft, non-tender, non-distended, BS + x 4.  Extremities: No clubbing, cyanosis. DP/PT/Radials 2+ and equal bilaterally. No edema.   Telemetry: Sinus tach low 100s  Labs: CBC  Recent Labs  11/16/14 0700 11/17/14 0450  WBC 16.2* 15.3*  HGB 12.7* 12.0*  HCT 36.7* 33.9*  MCV 85.2 84.5  PLT 434* 389   Basic  Metabolic Panel  Recent Labs  11/16/14 0700 11/17/14 0450  NA 134* 138  K 4.4 4.2  CL 103 104  CO2 26 26  GLUCOSE 101* 88  BUN 10 7  CALCIUM 8.9 8.9   Liver Function Tests  Recent Labs  11/16/14 0700  AST 148*  ALT 391*  ALKPHOS 97  BILITOT 1.8*  PROT 7.3  ALBUMIN 2.7*   No results for input(s): LIPASE, AMYLASE in the last 72 hours. Cardiac Enzymes No results for input(s): CKTOTAL, CKMB, CKMBINDEX, TROPONINI in the last 72 hours.  BNP: BNP (last 3 results)  Recent Labs  10/12/14 1710 10/17/14 0438 11/09/14 0820  BNP 879.0* 1091.1* 1689.6*    ProBNP (last 3 results) No results for input(s): PROBNP in the last 8760 hours.   D-Dimer No results for input(s): DDIMER in the last 72 hours. Hemoglobin A1C No results for input(s): HGBA1C in the last 72 hours. Fasting Lipid Panel No results for input(s): CHOL, HDL, LDLCALC, TRIG, CHOLHDL, LDLDIRECT in the last 72 hours. Thyroid Function Tests No results for input(s): TSH, T4TOTAL, T3FREE, THYROIDAB in the last 72 hours.  Invalid input(s): FREET3  Other results:     Imaging/Studies:  Dg Chest 2 View  11/15/2014   CLINICAL DATA:  Congestive heart failure. Community acquired pneumonia .  EXAM: CHEST  2 VIEW  COMPARISON:  11/12/2014.  CT 10/12/2014.  FINDINGS: Right IJ line in stable position. Partial resolution of rounded infiltrates in the right mid lung and right base. Continued close follow-up chest x-rays suggested to exclude underlying mass lesion. No significant pleural effusion. No pneumothorax. Heart size normal. No acute bony abnormality.  IMPRESSION: 1. Right IJ line in stable position. 2. Interim partial clearing of right mid lung and right lung base rounded infiltrates. Continued close follow-up chest x-rays are suggested to demonstrate complete clearing to exclude underlying mass lesion   Electronically Signed   By: Maisie Fus  Register   On: 11/15/2014 07:47    Latest Echo  Latest  Cath   Medications:     Scheduled Medications: . antiseptic oral rinse  7 mL Mouth Rinse BID  . digoxin  0.25 mg Oral Daily  . folic acid  1 mg Oral Daily  . isosorbide-hydrALAZINE  1 tablet Oral TID  . multivitamin with minerals  1 tablet Oral Daily  . pantoprazole  40 mg Oral Daily  . sacubitril-valsartan  1 tablet Oral BID  . sodium chloride  3 mL Intravenous Q12H  . spironolactone  25 mg Oral Daily  . thiamine  100 mg Oral Daily   Or  . thiamine  100 mg Intravenous Daily  . Warfarin - Pharmacist Dosing Inpatient   Does not apply q1800    Infusions: . heparin 1,450 Units/hr (11/17/14 0450)    PRN Medications: benzonatate, polyethylene glycol   Assessment/Plan   1. Acute on chronic systolic CHF with NICM, -> cardiogenic shock 2. AKI on CKD stage 2 3. Elevated troponin 4. ETOH abuse quit February 2016 5. Acute renal failure 6. Shock liver 7. Acute respiratory failure 8. PNA with hemoptysis 9. NSVT 10. 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 Blood Type  B+   Admitted with profound cardiogenic shock in setting of severe ETOH cardiomyopathy (EF 10%) and RLL PNA.   Stable off Milrinone. CO-OX 626%..  No bb with low output. Continue dig 0.25 mg daily. Dig level 0.8.  Volume status stable.. Cotinue Bidil 20/37.5 mg tid. On low dose entresto. Renal function ok. Has orange card for meds. Can remove central line.    RLL PNA. Blood cultures negative. WBC trending down.  Completed 7 days of antibiotics per primary team. UA ok.   INR  1.6.   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 May be transplant candidate if continues abstinence from ETOH.   Cardiac rehab and dietitian input appreciated. .    D/C meds Lasix 40 mg daily Entresto 24-26 mg twice a day Bidil 1 tab three times daily  Dig 0.25 mg daily Spiro 25 mg daily Warfarin INR 2.0-3.0  Has follow up next week in HF clinic. 8/25.   CLEGG,AMY, NP-C   7:17 AM  Advanced Heart  Failure Team Pager [phone] (M-F; 7a - 4p)  Please contact [facility] for night-coverage after hours (4p -7a ) and weekends on amion.  Patient seen and examined with Tonye Becket, NP. We discussed all aspects of the encounter. I agree with the assessment and plan as stated above.  Much improved. Co-ox stable. Can go home today off inotropes. We will see him in Clinic next week.   Tavius Turgeon,MD 8:11 AM

## 2014-11-17 NOTE — Progress Notes (Signed)
Family Medicine Teaching Service Daily Progress Note Intern Pager: [phone]  Patient name: Robert Booth Medical record number: [id number] Date of birth: 11/28/1970 Age: 44 y.o. Gender: male  Primary Care Provider: Lora Paula, MD Consultants: Cardiology, Pulmonology Code Status: FULL  Pt Overview and Major Events to Date:  - 7/13-7/19/16: Previous hospitalization for hemoptysis treated as HCAP. - 11/11/14: ECHO with LVEF 5-10% (decreased from 20% on 10/13/14) - 11/11/14: Transferred to Cardiac SDU/ICU - 11/11/14: Central line placed, milrinone and norepinephrine started - 11/13/14: LE swelling resolved, negative for DVT via doppler  - 11/13/14: norepinephrine discontinued. Low dose Bidil, digoxin and spironolactone started 8/14.  - 11/15/14: milrinone discontinued  Assessment and Plan: Robert Booth is a 44 y.o. male presenting with cough, SOB, hemoptysis and pleuritic chest pain. PMH is significant for HTN, dilated cardiomyopathy (NICM, reduced EF), alcoholism, and anxiety. Suspect pulmonary embolism given tachycardia, SOB, subtherapeutic INR on admission on coumadin, known LV thrombus, and positive D-dimer. BNP elevated to 1689.6.Was 1091.1 at last admission. Acute on chronic CHF exacerbation with cardiogenic and hepatic shock. AKI and hepatic shock now resolved.    # HFrEF: Now in cardiogenic shock. Weight down 10 lb from admission after diuresis. Net negative 0.8 L from 8/16-8/17.  - Heart Failure team consulted and following, appreciate recommendations.  - Continue lasix 40 mg PO today, per CHF team recommendations - Continue bidil to 1 tab TID  - Continue digoxin 0.25 mg daily. Level remains nontoxic at 0.2 (11/16/14). Level was 0.8 (11/15/14).  - Continue spironolactone 25 mg daily. Following potassium. - Entresto BID started 11/15/14. - Continue daily weights and strict I/Os - Trending carboxyhemoglobin. O2 sat 61.9 today, dispo home according to cardiology  - Cards indicating  may be transplant candidate if remain abstinent from ETOH. B+ blood type.  - Nutrition has counseled patient on heart healthy diet.   # Anticoagulation: INR: 1.60 <1.39 < 1.53 < 1.37 < 1.94 <  3.95 < 3.29 < 2.62 < 1.72. Denies any hemoptysis at this point. Previously elevated due to shock liver. - Goal INR is 2.0-3.0.   - Continue heparin gtt to bridge coumadin. Pharmacy managing.   # HCAP: Patient afebrile. Blood cx NGTD. Completed 7 days of antibiotics on 11/15/14 . Received vancomycin (2.5 days) along with ceftazidime (4.5 days), and 3 days of levaquin.   - WBC -15.3, trending down from 16.2  - CXR 11/15/14 shows interim partial clearing of right mid lung and right lung base infiltrates. - Monitor WBC and vitals.   # Hemoptysis: Per patient, resolved as of 11/12/14. V/Q scan intermediate risk. Hemoglobin 12.7 < < 10.9 < 12.1 < 12.9. Negative ANA and ANCA titers. Pulmonology consulted, signed off  - Hemoglobin stable.  - Pharmacy consulting for anticoag management. Continue heparin gtt with goal level of 0.3-0.7.  - Coumadin restarted 8/14, per cards. Pharmacy managing. Continue to bridge.   # Hepatic Failure: Improving. AST 148 <318 << 1751 < 2616 < 1290, ALT 391< 640 << 1213 < 1251 < 697. LE dopplers showed pulsatile venous flow suggestive of right-sided heart pressure, concerning for hepatic congestion. RUQ U/S negative for ascites. Neg Hepatitis panel. Acetaminophen level normal.  - MELD score 10 (calculated 11/14/14) compared to 34 on 10/1214. - Trending down AST/ALT   # AKI: Resolved. 0.99 << 2.17 < 1.22 (1.22 in July) Suspect AKI 2/2 hypoperfusion in setting of cardiac shock. Urine output high on lasix. Salicylate level normal. Normal renal U/S. UA positive for granular casts, consistent with ATN.  UDS positive for opiates (received during hospitalization). AntiGBM AB negative.   -  Continue to Monitor   #Hx of LV thrombus - Continue anticoagulation.   #HTN: Continues to be  normotensive - Continue home metoprolol.   # Chest Pain: Resolved right-sided chest pain. Troponins trended and peaked at 0.09.   # Nausea: Resolved.  # H/o Anxiety: not currently medically treated  # Alcohol Abuse: Denies ETOH use (states last drink was in February) - CIWA protocol.   FEN/GI: heart healthy diet  PPx: protonix 40 mg daily  Disposition: Admitted to the [facility] on telemetry.   Subjective:  Patient ready to go home today. No complaints.   Objective: Temp:  [97.3 F (36.3 C)-98 F (36.7 C)] 97.6 F (36.4 C) (08/18 0445) Pulse Rate:  [93-110] 97 (08/18 0445) Resp:  [14-18] 16 (08/18 0445) BP: (86-121)/(45-79) 115/67 mmHg (08/18 0445) SpO2:  [97 %-100 %] 97 % (08/18 0445) Weight:  [181 lb (82.1 kg)] 181 lb (82.1 kg) (08/18 0445)  Physical Exam: General: Awake, sitting up in chair, HEENT: Mucus membranes moist, central line still in place Cardiovascular: Tachycardic. +S3 Respiratory: CTAB, no crackles  Abdomen: Soft, non-tender, nondistended, positive BS.  Extremities: No pitting edema. DP pulses 2+ bilaterally.    Skin: WWP.    Laboratory:  Recent Labs Lab 11/15/14 0440 11/16/14 0700 11/17/14 0450  WBC 15.7* 16.2* 15.3*  HGB 11.6* 12.7* 12.0*  HCT 33.6* 36.7* 33.9*  PLT 380 434* 389    Recent Labs Lab 11/12/14 0425  11/14/14 0415 11/15/14 0440 11/16/14 0700 11/17/14 0450  NA 131*  < > 136 133* 134* 138  K 3.0*  < > 4.4 4.2 4.4 4.2  CL 94*  < > 100* 99* 103 104  CO2 27  < > BUN 40*  < > CREATININE 2.17*  < > 1.05 1.04 0.99 0.94  CALCIUM 8.1*  < > 8.3* 8.6* 8.9 8.9  PROT 6.2*  --  6.3*  --  7.3  --   BILITOT 5.3*  --  2.3*  --  1.8*  --   ALKPHOS 71  --  76  --  97  --   ALT 1213*  --  640*  --  391*  --   AST 1751*  --  318*  --  148*  --   GLUCOSE 107*  < > 95 101* 101* 88  < > = values in this interval not displayed.  Imaging/Diagnostic Tests:  Dg Chest 2 View  11/15/2014    CLINICAL DATA:  Congestive heart failure. Community acquired pneumonia .  EXAM: CHEST  2 VIEW  COMPARISON:  11/12/2014.  CT 10/12/2014.  FINDINGS: Right IJ line in stable position. Partial resolution of rounded infiltrates in the right mid lung and right base. Continued close follow-up chest x-rays suggested to exclude underlying mass lesion. No significant pleural effusion. No pneumothorax. Heart size normal. No acute bony abnormality.  IMPRESSION: 1. Right IJ line in stable position. 2. Interim partial clearing of right mid lung and right lung base rounded infiltrates. Continued close follow-up chest x-rays are suggested to demonstrate complete clearing to exclude underlying mass lesion   Electronically Signed   By: Maisie Fus  Register   On: 11/15/2014 07:47   Dg Chest 2 View  11/09/2014   CLINICAL DATA:  Recent pneumonia  EXAM: CHEST  2 VIEW  COMPARISON:  10/13/2014  FINDINGS: Borderline cardiomegaly. There is new nodular  consolidation right perihilar region. Persistent infiltrate/ consolidation right lower lobe. Findings suspicious for recurrent multifocal pneumonia. No pulmonary edema. Follow-up to resolution after appropriate treatment recommended.  IMPRESSION: There is new nodular consolidation right perihilar region. Persistent infiltrate/ consolidation right lower lobe. Findings suspicious for recurrent multifocal pneumonia. No pulmonary edema. Follow-up to resolution after appropriate treatment recommended.   Electronically Signed   By: Natasha Mead M.D.   On: 11/09/2014 08:08   US Renal  11/10/2014   CLINICAL DATA:  Acute kidney injury  EXAM: RENAL / URINARY TRACT ULTRASOUND COMPLETE  COMPARISON:  None.  FINDINGS: Right Kidney:  Length: 11.7 cm. Echogenicity within normal limits. No mass or hydronephrosis visualized.  Left Kidney:  Length: 13.1 cm. Echogenicity within normal limits. No mass or hydronephrosis visualized.  Bladder:  Bladder is decompressed and therefore not evaluated.  IMPRESSION: Kidneys  appear normal.  Bladder not evaluated.   Electronically Signed   By: Esperanza Heir M.D.   On: 11/10/2014 16:54   Nm Pulmonary Perf And Vent  11/10/2014   CLINICAL DATA:  Shortness of breath, chest pain, hemoptysis, history hypertension, non ischemic cardiomyopathy, former smoker  EXAM: NUCLEAR MEDICINE VENTILATION - PERFUSION LUNG SCAN  TECHNIQUE: Ventilation images were obtained in multiple projections using inhaled aerosol Tc-14m DTPA. Perfusion images were obtained in multiple projections after intravenous injection of Tc-85m MAA.  RADIOPHARMACEUTICALS:  39.0 mCi Technetium-17m DTPA aerosol inhalation and 5.9 mCi Technetium-2m MAA IV  COMPARISON:  None  Correlation: Chest radiograph 11/09/2014  FINDINGS: Ventilation: Impaired ventilation identified in RIGHT middle and RIGHT lower lobes. RIGHT lower lobe absent perfusion corresponds to area of radiographic abnormality on preceding chest radiograph and recent CT. No abnormalities at RIGHT middle lobe on recent chest radiograph. Heart appears enlarged.  Perfusion: Matching diminished perfusion involving the RIGHT middle and RIGHT lower lobes. No additional areas of segmental or subsegmental perfusion decrease. Heart appears enlarged.  Chest radiograph: Significant RIGHT lower lobe infiltrate. No definite RIGHT middle lobe infiltrate.  IMPRESSION: Matching decreased ventilation, decreased perfusion, and radiographic abnormalities in RIGHT lower lobe.  Matching decreased subsegmental ventilation and perfusion abnormalities in RIGHT middle lobe with clear chest radiograph.  No perfusion abnormalities of the LEFT lung.  Findings represent an intermediate probability of pulmonary embolism.   Electronically Signed   By: Ulyses Southward M.D.   On: 11/10/2014 15:37   Dg Chest Port 1 View  11/12/2014   CLINICAL DATA:  Respiratory failure  EXAM: PORTABLE CHEST - 1 VIEW  COMPARISON:  11/11/2014  FINDINGS: Persistent airspace disease in the right lower lobe. Trace right  pleural effusion. Hazy left lower lobe airspace disease. No pneumothorax. Stable cardiomediastinal silhouette. Right jugular central venous catheter with the tip projecting over the SVC.  No acute osseous abnormality.  IMPRESSION: 1. Persistent right lower lobe pneumonia. No significant interval change. Followup PA and lateral chest X-ray is recommended in 3-4 weeks following trial of antibiotic therapy to ensure resolution and exclude underlying malignancy. 2. Hazy left lower lobe airspace disease which may reflect atelectasis versus developing pneumonia.   Electronically Signed   By: Elige Ko   On: 11/12/2014 08:19   Dg Chest Port 1 View  11/11/2014   CLINICAL DATA:  Central line insertion.  EXAM: PORTABLE CHEST - 1 VIEW  COMPARISON:  Two-view chest x-ray 11/09/2014. CT of the chest 10/12/2014.  FINDINGS: The heart is enlarged. Any right IJ line is been placed. The tip projects over the right SVC, above the cavoatrial junction.  Progressive airspace consolidation  is present in the right lower lobe. A right pleural effusion is suspected. The left lung is clear. Lung volumes are low.  IMPRESSION: 1. Interval placement of right IJ line without complication. The tip is in the mid SVC. 2. Stable cardiomegaly. 3. Progressive airspace consolidation in the right lower lobe.   Electronically Signed   By: Marin Roberts M.D.   On: 11/11/2014 20:53   US Abdomen Limited Ruq  11/11/2014   CLINICAL DATA:  Elevated liver function tests.  EXAM: US ABDOMEN LIMITED - RIGHT UPPER QUADRANT  COMPARISON:  None.  FINDINGS: Gallbladder:  No gallstones are identified. The gallbladder is incompletely distended. The wall is thickened at 0.5 cm. No pericholecystic fluid. Sonographer reports negative Murphy's sign.  Common bile duct:  Diameter: 0.4 cm.  Liver:  No focal lesion identified. The liver demonstrates mildly increased echogenicity. The portal vein is patent.  IMPRESSION: Gallbladder wall thickening without stones or  pericholecystic fluid may be due to underdistention or hypoproteinemia. No ascites is identified.  Mildly increased echogenicity of the liver consistent with fatty infiltration.   Electronically Signed   By: Drusilla Kanner M.D.   On: 11/11/2014 11:11    Antone Summons Mayra Reel, MD 11/17/2014, 7:13 AM PGY-1, [facility] FPTS Intern pager: [phone], text pages welcome

## 2014-11-22 ENCOUNTER — Ambulatory Visit: Payer: No Typology Code available for payment source | Attending: Family Medicine | Admitting: Pharmacist

## 2014-11-22 DIAGNOSIS — Z5181 Encounter for therapeutic drug level monitoring: Secondary | ICD-10-CM

## 2014-11-22 DIAGNOSIS — I513 Intracardiac thrombosis, not elsewhere classified: Secondary | ICD-10-CM

## 2014-11-22 LAB — POCT INR: INR: 1.4

## 2014-11-22 NOTE — Progress Notes (Signed)
Robert Booth is a 44 y.o. male who is on warfarin for left ventricular mural thrombus. He was recently discharged from the hospital and is on enoxaparin as a bridge until he has a therapeutic INR.  Patient is actually chronically followed by [facility] and would like to continue to use that Coumadin Clinic.  Because patient was here, I wanted to check his INR to hopefully aid in getting him to a therapeutic range quicker and to get his off the enoxaparin injections.  INR 1.4 so asked patient to take 1.5 tablets today and then go back on his previous dose (which he has been therapeutic on). Patient to follow up with cardiology on Thursday 11/24/14. Patient to continue enoxaparin injections as bridge.   Juanita Craver, PharmD, BCPS, CPP Clinical Pharmacist

## 2014-11-24 ENCOUNTER — Encounter (HOSPITAL_COMMUNITY): Payer: Self-pay

## 2014-11-24 ENCOUNTER — Ambulatory Visit (HOSPITAL_COMMUNITY)
Admission: RE | Admit: 2014-11-24 | Discharge: 2014-11-24 | Disposition: A | Discharge status: Home / Self Care | Payer: Medicaid Other | Source: Ambulatory Visit | Attending: Internal Medicine | Admitting: Internal Medicine

## 2014-11-24 VITALS — BP 108/72 | HR 79 | Ht 69.0 in | Wt 180.0 lb

## 2014-11-24 DIAGNOSIS — I5022 Chronic systolic (congestive) heart failure: Secondary | ICD-10-CM

## 2014-11-24 DIAGNOSIS — I129 Hypertensive chronic kidney disease with stage 1 through stage 4 chronic kidney disease, or unspecified chronic kidney disease: Secondary | ICD-10-CM | POA: Insufficient documentation

## 2014-11-24 DIAGNOSIS — Z7901 Long term (current) use of anticoagulants: Secondary | ICD-10-CM | POA: Diagnosis not present

## 2014-11-24 DIAGNOSIS — Z8249 Family history of ischemic heart disease and other diseases of the circulatory system: Secondary | ICD-10-CM | POA: Insufficient documentation

## 2014-11-24 DIAGNOSIS — I252 Old myocardial infarction: Secondary | ICD-10-CM | POA: Insufficient documentation

## 2014-11-24 DIAGNOSIS — Z79899 Other long term (current) drug therapy: Secondary | ICD-10-CM | POA: Insufficient documentation

## 2014-11-24 DIAGNOSIS — Z87891 Personal history of nicotine dependence: Secondary | ICD-10-CM | POA: Insufficient documentation

## 2014-11-24 DIAGNOSIS — F1010 Alcohol abuse, uncomplicated: Secondary | ICD-10-CM | POA: Insufficient documentation

## 2014-11-24 DIAGNOSIS — Z8701 Personal history of pneumonia (recurrent): Secondary | ICD-10-CM | POA: Insufficient documentation

## 2014-11-24 DIAGNOSIS — N182 Chronic kidney disease, stage 2 (mild): Secondary | ICD-10-CM | POA: Diagnosis not present

## 2014-11-24 DIAGNOSIS — I420 Dilated cardiomyopathy: Secondary | ICD-10-CM

## 2014-11-24 DIAGNOSIS — I513 Intracardiac thrombosis, not elsewhere classified: Secondary | ICD-10-CM

## 2014-11-24 DIAGNOSIS — I428 Other cardiomyopathies: Secondary | ICD-10-CM | POA: Diagnosis not present

## 2014-11-24 DIAGNOSIS — I213 ST elevation (STEMI) myocardial infarction of unspecified site: Secondary | ICD-10-CM

## 2014-11-24 LAB — BRAIN NATRIURETIC PEPTIDE: B Natriuretic Peptide: 106.8 pg/mL — ABNORMAL HIGH (ref 0.0–100.0)

## 2014-11-24 LAB — CBC
HEMATOCRIT: 36 % — AB (ref 39.0–52.0)
Hemoglobin: 12.2 g/dL — ABNORMAL LOW (ref 13.0–17.0)
MCH: 28.8 pg (ref 26.0–34.0)
MCHC: 33.9 g/dL (ref 30.0–36.0)
MCV: 85.1 fL (ref 78.0–100.0)
Platelets: 421 10*3/uL — ABNORMAL HIGH (ref 150–400)
RBC: 4.23 MIL/uL (ref 4.22–5.81)
RDW: 15.7 % — AB (ref 11.5–15.5)
WBC: 5.4 10*3/uL (ref 4.0–10.5)

## 2014-11-24 LAB — COMPREHENSIVE METABOLIC PANEL
ALK PHOS: 89 U/L (ref 38–126)
ALT: 90 U/L — AB (ref 17–63)
AST: 48 U/L — AB (ref 15–41)
Albumin: 3.6 g/dL (ref 3.5–5.0)
Anion gap: 9 (ref 5–15)
BUN: 13 mg/dL (ref 6–20)
CALCIUM: 9.7 mg/dL (ref 8.9–10.3)
CHLORIDE: 100 mmol/L — AB (ref 101–111)
CO2: 28 mmol/L (ref 22–32)
CREATININE: 1.05 mg/dL (ref 0.61–1.24)
GFR calc Af Amer: >60 mL/min (ref >60)
GFR calc non Af Amer: >60 mL/min (ref >60)
GLUCOSE: 96 mg/dL (ref 65–99)
Potassium: 4.4 mmol/L (ref 3.5–5.1)
SODIUM: 137 mmol/L (ref 135–145)
Total Bilirubin: 0.8 mg/dL (ref 0.3–1.2)
Total Protein: 8.3 g/dL — ABNORMAL HIGH (ref 6.5–8.1)

## 2014-11-24 LAB — PROTIME-INR
INR: 1.47 (ref 0.00–1.49)
Prothrombin Time: 17.9 seconds — ABNORMAL HIGH (ref 11.6–15.2)

## 2014-11-24 MED ORDER — CARVEDILOL 3.125 MG PO TABS: 3.1250 mg | ORAL_TABLET | Freq: Two times a day (BID) | ORAL | Status: DC

## 2014-11-24 NOTE — Progress Notes (Signed)
Patient ID: Robert Booth, male   DOB: Jun 03, 1970, 44 y.o.   MRN: [id number] PCP: Primary Cardiologist:  HPI:  Robert Booth is a 44 y.o. male with hx of systolic HF with NICM secondary to prolonged ETOH abuse, EF 5-10% by Echo 11/11/14, and CKD stage 2.  He presented to [facility] with hemoptysis. Found to have RLL PNA with procalcitonin > 8.0 Central line placed 8/12 with co-ox 27% and CVP 24 c/w cardiogenic shock. Started on milrinone and levophed. Diuresed well. Discharge weight 177   Since being home has felt really good. Watching salt and fluids closely. Weighing every day. Weight stable 175-178. On coumadin. Last INR 1.4. On lovenox. Taking all meds as prescribed. Walking 1/2 mile every day at decent speed without any problems. No edema, orthopnea, or PND. Living with sister. Gets tired on steps. No ETOH   Echo 10/13/14 EF 20% Echo 11/11/14 EF 5-10%. Severe RV dysfucntion   ROS: All systems negative except as listed in HPI, PMH and Problem List.  SH:  Social History   Social History  . Marital Status: Single    Spouse Name: N/A  . Number of Children: 6  . Years of Education: N/A   Occupational History  . Unemployed    Social History Main Topics  . Smoking status: Former Smoker    Types: Cigarettes  . Smokeless tobacco: Former Neurosurgeon    Types: Chew     Comment: Was a rare smoker  . Alcohol Use: No  . Drug Use: No  . Sexual Activity: Yes   Other Topics Concern  . Not on file   Social History Narrative   Lives with mom.      FH:  Family History  Problem Relation Age of Onset  . Hypertension Mother   . Hypertension Father   . Heart attack Father 9    died  . Hypertension Sister   . Heart attack Paternal Grandfather 22    died    Past Medical History  Diagnosis Date  . Hypertension   . Alcoholism     a. Sober since Feb 2016.  Marland Kitchen Anxiety   . NICM (nonischemic cardiomyopathy)     a. 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  . 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 Acute kidney injury     Current Outpatient Prescriptions  Medication Sig Dispense Refill  . digoxin (LANOXIN) 0.25 MG tablet Take 1 tablet (0.25 mg total) by mouth daily. 30 tablet 0  . enoxaparin (LOVENOX) 120 MG/0.8ML injection Inject 0.8 mLs (120 mg total) into the skin daily. 7 Syringe 0  . furosemide (LASIX) 40 MG tablet Take 1 tablet (40 mg total) by mouth daily. 40 mg by mouth daily, take an extra 40 mg dose 6 hrs later as needed for weight gain 30 tablet 0  . isosorbide-hydrALAZINE (BIDIL) 20-37.5 MG per tablet Take 1 tablet by mouth 3 (three) times daily. 90 tablet 0  . sacubitril-valsartan (ENTRESTO) 24-26 MG Take 1 tablet by mouth 2 (two) times daily. 60 tablet 0  . spironolactone (ALDACTONE) 25 MG tablet Take 1 tablet (25 mg total) by mouth daily. 30 tablet 0  . warfarin (COUMADIN) 7.5 MG tablet Take 1 tablet (7.5 mg total) by mouth daily. 30 tablet 0   No current facility-administered medications for  this encounter.    Filed Vitals:   11/24/14 1057  BP: 108/72  Pulse: 79  Height: 5\' 9"  (1.753 m)  Weight: 180 lb (81.647 kg)  SpO2: 98%    PHYSICAL EXAM:  General:  Well appearing. No resp difficulty HEENT: normal Neck: supple. JVP flat. Carotids 2+ bilaterally; no bruits. No lymphadenopathy or thryomegaly appreciated. Cor: PMI laterally displaced.  Regular rate & rhythm. No rubs, gallops or murmurs. Lungs: clear Abdomen: soft, nontender, nondistended. No hepatosplenomegaly. No bruits or masses. Good bowel sounds. Extremities: no cyanosis, clubbing, rash, edema Neuro: alert & orientedx3, cranial nerves grossly intact. Moves all 4 extremities w/o difficulty. Affect pleasant.   ASSESSMENT & PLAN: 1. Chronic systolic HF - due to suspected ETOH cardiomyopathy. EF 10%  byt ECHO 8/16. Severe RV dysfunction.  2. LV mural thrombus  3. H/o ETOH abuse quit 05/25/2014 4. Recent PNA 8/16  Much improved after recent admission for shock and PNA. NYHA II-III. Volume status looks good. Continue above medications and will add carvedilol 3.125 bid. Watch closely for for worsening symptoms. Check labs today  RTC in 2-3 weeks for further med titration.   Bensimhon, Daniel,MD 11:34 AM

## 2014-11-24 NOTE — Patient Instructions (Signed)
START Coreg 3.125 mg, one tab twice a day  Labs today  All of your medications have been sent into the [facility]  Your physician recommends that you schedule a follow-up appointment in: 2-3 weeks  Do the following things EVERYDAY: 1) Weigh yourself in the morning before breakfast. Write it down and keep it in a log. 2) Take your medicines as prescribed 3) Eat low salt foods-Limit salt (sodium) to 2000 mg per day.  4) Stay as active as you can everyday 5) Limit all fluids for the day to less than 2 liters 6)

## 2014-11-25 ENCOUNTER — Other Ambulatory Visit (HOSPITAL_COMMUNITY): Payer: Self-pay | Admitting: *Deleted

## 2014-11-25 NOTE — Telephone Encounter (Signed)
Patient called to ask about his Lovenox injections, and if he was suppose to continue to take them. He said he ran out today and only has coumadin. Patient said that he couldn't afford Lovenox. He also said that he needed to be set up for an appointment at the coumadin clinic @ [facility].   Got patient an appt on Monday 08/29 @ 0945  Spoke with pharmacist to ask if suppose to continue Lovenox or what to do.  Per Kennon Rounds at coumadin clinic pt needs to continue his Lovenox until Monday  [facility] and they are going to give him 4 Lovenox injections.  Sent over RX for Lovenox 120 mg x 4

## 2014-11-28 ENCOUNTER — Ambulatory Visit (INDEPENDENT_AMBULATORY_CARE_PROVIDER_SITE_OTHER): Payer: No Typology Code available for payment source

## 2014-11-28 DIAGNOSIS — I513 Intracardiac thrombosis, not elsewhere classified: Secondary | ICD-10-CM

## 2014-11-28 DIAGNOSIS — I213 ST elevation (STEMI) myocardial infarction of unspecified site: Secondary | ICD-10-CM

## 2014-11-28 DIAGNOSIS — Z5181 Encounter for therapeutic drug level monitoring: Secondary | ICD-10-CM

## 2014-11-28 LAB — POCT INR: INR: 1.4

## 2014-11-28 MED ORDER — ENOXAPARIN SODIUM 120 MG/0.8ML ~~LOC~~ SOLN: 120.0000 mg | SUBCUTANEOUS | Status: DC

## 2014-11-28 MED ORDER — WARFARIN SODIUM 7.5 MG PO TABS: 7.5000 mg | ORAL_TABLET | Freq: Every day | ORAL | Status: DC

## 2014-11-30 ENCOUNTER — Encounter (HOSPITAL_COMMUNITY): Payer: No Typology Code available for payment source

## 2014-11-30 ENCOUNTER — Telehealth: Payer: Self-pay | Admitting: Licensed Clinical Social Worker

## 2014-11-30 NOTE — Telephone Encounter (Signed)
CSW referred to follow up with patient regarding his medicaid application. CSW spoke with patient via phone who states he has a pending Therapist, nutritional and waiting to hear back outcome. Patient reports a representative from the [facility] assisted with the application and has been helpful with follow up. Patient confirmed he has the orange card to assist with immediate healthcare needs. CSW offered support and will be available if further needs arise. [name], LCSW [phone]

## 2014-12-02 ENCOUNTER — Other Ambulatory Visit (HOSPITAL_COMMUNITY): Payer: Self-pay | Admitting: *Deleted

## 2014-12-02 ENCOUNTER — Ambulatory Visit (INDEPENDENT_AMBULATORY_CARE_PROVIDER_SITE_OTHER): Payer: No Typology Code available for payment source | Admitting: *Deleted

## 2014-12-02 DIAGNOSIS — Z5181 Encounter for therapeutic drug level monitoring: Secondary | ICD-10-CM

## 2014-12-02 DIAGNOSIS — I213 ST elevation (STEMI) myocardial infarction of unspecified site: Secondary | ICD-10-CM

## 2014-12-02 DIAGNOSIS — I513 Intracardiac thrombosis, not elsewhere classified: Secondary | ICD-10-CM

## 2014-12-02 LAB — POCT INR: INR: 1.9

## 2014-12-02 MED ORDER — WARFARIN SODIUM 7.5 MG PO TABS: 7.5000 mg | ORAL_TABLET | Freq: Every day | ORAL | Status: DC

## 2014-12-02 MED ORDER — ISOSORB DINITRATE-HYDRALAZINE 20-37.5 MG PO TABS: 1.0000 | ORAL_TABLET | Freq: Three times a day (TID) | ORAL | Status: DC

## 2014-12-02 NOTE — Telephone Encounter (Signed)
Entresto samples left at front desk for Mr Chadbourne to pickup  [place] assistance is still processing.

## 2014-12-08 ENCOUNTER — Ambulatory Visit (INDEPENDENT_AMBULATORY_CARE_PROVIDER_SITE_OTHER): Payer: No Typology Code available for payment source | Admitting: *Deleted

## 2014-12-08 DIAGNOSIS — Z5181 Encounter for therapeutic drug level monitoring: Secondary | ICD-10-CM

## 2014-12-08 DIAGNOSIS — I513 Intracardiac thrombosis, not elsewhere classified: Secondary | ICD-10-CM

## 2014-12-08 DIAGNOSIS — I213 ST elevation (STEMI) myocardial infarction of unspecified site: Secondary | ICD-10-CM

## 2014-12-08 LAB — POCT INR: INR: 2.7

## 2014-12-14 ENCOUNTER — Encounter (HOSPITAL_COMMUNITY): Payer: Self-pay

## 2014-12-14 ENCOUNTER — Ambulatory Visit (HOSPITAL_COMMUNITY)
Admission: RE | Admit: 2014-12-14 | Discharge: 2014-12-14 | Disposition: A | Discharge status: Home / Self Care | Payer: Medicaid Other | Source: Ambulatory Visit | Attending: Internal Medicine | Admitting: Internal Medicine

## 2014-12-14 VITALS — BP 110/78 | HR 83 | Wt 185.8 lb

## 2014-12-14 DIAGNOSIS — I420 Dilated cardiomyopathy: Secondary | ICD-10-CM

## 2014-12-14 DIAGNOSIS — I5022 Chronic systolic (congestive) heart failure: Secondary | ICD-10-CM | POA: Diagnosis not present

## 2014-12-14 DIAGNOSIS — I213 ST elevation (STEMI) myocardial infarction of unspecified site: Secondary | ICD-10-CM

## 2014-12-14 DIAGNOSIS — F1010 Alcohol abuse, uncomplicated: Secondary | ICD-10-CM | POA: Diagnosis not present

## 2014-12-14 DIAGNOSIS — F1011 Alcohol abuse, in remission: Secondary | ICD-10-CM | POA: Insufficient documentation

## 2014-12-14 DIAGNOSIS — I513 Intracardiac thrombosis, not elsewhere classified: Secondary | ICD-10-CM

## 2014-12-14 MED ORDER — CARVEDILOL 6.25 MG PO TABS: 6.2500 mg | ORAL_TABLET | Freq: Two times a day (BID) | ORAL | Status: DC

## 2014-12-14 NOTE — Patient Instructions (Signed)
INCREASE Carvedilol to 6.25mg , one tab twice a day (a new RX has been sent into the [facility])  Your physician recommends that you schedule a follow-up appointment in: 3 weeks with the nurse practitioner  Do the following things EVERYDAY: 1) Weigh yourself in the morning before breakfast. Write it down and keep it in a log. 2) Take your medicines as prescribed 3) Eat low salt foods-Limit salt (sodium) to 2000 mg per day.  4) Stay as active as you can everyday 5) Limit all fluids for the day to less than 2 liters 6)

## 2014-12-14 NOTE — Progress Notes (Signed)
Patient ID: Robert Booth, male   DOB: 03-Sep-1970, 44 y.o.   MRN: [id number] PCP: Primary HF Cardiologist: Dr Loretha Brasil  HPI: Robert Booth is a 44 y.o. male with hx of systolic HF with NICM secondary to prolonged ETOH abuse, EF 5-10% by Echo 11/11/14, and CKD stage 2.  He presented to [facility] with hemoptysis. Found to have RLL PNA with procalcitonin > 8.0 Central line placed 8/12 with co-ox 27% and CVP 24 c/w cardiogenic shock. Started on milrinone and levophed. Diuresed well. Discharge weight 177   He returns for HF follow up. Last visit started on carvedilol 3.125 mg twice a day. Overall feeling ok. Denies SOB/PND/Orthopnea. No BRBPR. Able to jog in place for 20 minutes.  Weight at home 181-182 pounds. Appetite ok. Taking all medications. No alcohol intake. Currently unemployed.   Echo 10/13/14 EF 20% Echo 11/11/14 EF 5-10%. Severe RV dysfucntion  Labs 11/24/2014: K 4.4 Creatinine 1.05  Labs 11/16/2014 Dig level 0.2   ROS: All systems negative except as listed in HPI, PMH and Problem List.  SH:  Social History   Social History  . Marital Status: Single    Spouse Name: N/A  . Number of Children: 6  . Years of Education: N/A   Occupational History  . Unemployed    Social History Main Topics  . Smoking status: Former Smoker    Types: Cigarettes  . Smokeless tobacco: Former Neurosurgeon    Types: Chew     Comment: Was a rare smoker  . Alcohol Use: No  . Drug Use: No  . Sexual Activity: Yes   Other Topics Concern  . Not on file   Social History Narrative   Lives with mom.      FH:  Family History  Problem Relation Age of Onset  . Hypertension Mother   . Hypertension Father   . Heart attack Father 80    died  . Hypertension Sister   . Heart attack Paternal Grandfather 58    died    Past Medical History  Diagnosis Date  . Hypertension   . Alcoholism     a. Sober since Feb 2016.  Marland Kitchen Anxiety   . NICM (nonischemic cardiomyopathy)     a. 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  . 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 Acute kidney injury     Current Outpatient Prescriptions  Medication Sig Dispense Refill  . carvedilol (COREG) 3.125 MG tablet Take 1 tablet (3.125 mg total) by mouth 2 (two) times daily with a meal. 60 tablet 3  . digoxin (LANOXIN) 0.25 MG tablet Take 1 tablet (0.25 mg total) by mouth daily. 30 tablet 0  . furosemide (LASIX) 40 MG tablet Take 1 tablet (40 mg total) by mouth daily. 40 mg by mouth daily, take an extra 40 mg dose 6 hrs later as needed for weight gain 30 tablet 0  . isosorbide-hydrALAZINE (BIDIL) 20-37.5 MG per tablet Take 1 tablet by mouth 3 (three) times daily. 90 tablet 3  . sacubitril-valsartan (ENTRESTO) 24-26 MG Take 1 tablet by mouth 2 (two) times daily. 60 tablet 0  . spironolactone (ALDACTONE) 25 MG tablet Take 1 tablet (25 mg total) by mouth daily. 30 tablet 0  . warfarin (COUMADIN) 7.5 MG tablet Take 1 tablet (7.5 mg total) by  mouth daily. 40 tablet 1   No current facility-administered medications for this encounter.    Filed Vitals:   12/14/14 1020  BP: 110/78  Pulse: 83  Weight: 185 lb 12.8 oz (84.278 kg)  SpO2: 98%    PHYSICAL EXAM:  General:  Well appearing. No resp difficulty HEENT: normal Neck: supple. JVP flat. Carotids 2+ bilaterally; no bruits. No lymphadenopathy or thryomegaly appreciated. Cor: PMI laterally displaced.  Regular rate & rhythm. No rubs, gallops or murmurs. Lungs: clear Abdomen: soft, nontender, nondistended. No hepatosplenomegaly. No bruits or masses. Good bowel sounds. Extremities: no cyanosis, clubbing, rash, edema Neuro: alert & orientedx3, cranial nerves grossly intact. Moves all 4 extremities w/o difficulty. Affect pleasant.   ASSESSMENT & PLAN: 1. Chronic systolic HF -NICM   Cath ok performed [place] 2016. 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 EF 10% byt ECHO 8/16. Severe RV dysfunction. Reviewed BMET from 8.25.  NYHA I. Volume status stable.. Continue lasix 40 mg daily with extra lasix if needed. Contineu 25 mg spiro daily.  Increase carvedilol 6.25 mg twice a day. Continue dig 0.25 mg daily.   Continue entreseto 24-26 mg twice daily Cotninue bidil 20/37.5 mg tid.  Repeat  ECHO early December Need to consider C MRI. 2. LV mural thrombus - on coumadin INR followed at coumadin clinic  3. H/o ETOH abuse quit 05/25/2014 was drinking 1/5 liquor daily 4. Recent PNA 8/16  Follow up in 3 weeks med titration.   CLEGG,AMY,NP-C  10:03 AM

## 2014-12-19 ENCOUNTER — Telehealth (HOSPITAL_COMMUNITY): Payer: Self-pay | Admitting: *Deleted

## 2014-12-19 MED ORDER — SACUBITRIL-VALSARTAN 24-26 MG PO TABS: 1.0000 | ORAL_TABLET | Freq: Two times a day (BID) | ORAL | Status: DC

## 2014-12-19 NOTE — Telephone Encounter (Signed)
Pt applied and was approved for the [employer] to receive his Sherryll Burger for free, he is approved through 12/15/15 they just need a prescription faxed to them at [phone], rx faxed

## 2014-12-22 ENCOUNTER — Telehealth (HOSPITAL_COMMUNITY): Payer: Self-pay | Admitting: *Deleted

## 2014-12-22 ENCOUNTER — Ambulatory Visit (INDEPENDENT_AMBULATORY_CARE_PROVIDER_SITE_OTHER): Payer: No Typology Code available for payment source | Admitting: *Deleted

## 2014-12-22 DIAGNOSIS — I213 ST elevation (STEMI) myocardial infarction of unspecified site: Secondary | ICD-10-CM

## 2014-12-22 DIAGNOSIS — Z5181 Encounter for therapeutic drug level monitoring: Secondary | ICD-10-CM

## 2014-12-22 DIAGNOSIS — I513 Intracardiac thrombosis, not elsewhere classified: Secondary | ICD-10-CM

## 2014-12-22 LAB — POCT INR: INR: 3.3

## 2014-12-22 NOTE — Telephone Encounter (Signed)
Pt called to ask about paper for Medicaid.   Authorization for information signed by patient; 10/21/14  OV notes and labs sent to [facility] [phone] fax sent and OK sent

## 2014-12-26 ENCOUNTER — Encounter (HOSPITAL_COMMUNITY): Payer: Self-pay | Admitting: *Deleted

## 2014-12-26 ENCOUNTER — Telehealth (HOSPITAL_COMMUNITY): Payer: Self-pay | Admitting: *Deleted

## 2014-12-26 NOTE — Telephone Encounter (Signed)
Pt called requesting a letter stating he is unable to work due to his heart condition so he can get help with food stamps.  Per last OV note EF is 10% and pt states he is too sick to work, he has applied for SS disability but is awaiting a response.  Letter completed for pt, will have Dr Gala Romney sign and pt will pick up this afternoon

## 2015-01-05 ENCOUNTER — Encounter (HOSPITAL_COMMUNITY): Payer: Self-pay

## 2015-01-05 ENCOUNTER — Ambulatory Visit (INDEPENDENT_AMBULATORY_CARE_PROVIDER_SITE_OTHER): Payer: No Typology Code available for payment source | Admitting: *Deleted

## 2015-01-05 ENCOUNTER — Ambulatory Visit (HOSPITAL_COMMUNITY)
Admission: RE | Admit: 2015-01-05 | Discharge: 2015-01-05 | Disposition: A | Discharge status: Home / Self Care | Payer: Medicaid Other | Source: Ambulatory Visit | Attending: Cardiology | Admitting: Cardiology

## 2015-01-05 VITALS — BP 116/70 | HR 54 | Wt 183.2 lb

## 2015-01-05 DIAGNOSIS — Z5181 Encounter for therapeutic drug level monitoring: Secondary | ICD-10-CM

## 2015-01-05 DIAGNOSIS — F1011 Alcohol abuse, in remission: Secondary | ICD-10-CM

## 2015-01-05 DIAGNOSIS — I5022 Chronic systolic (congestive) heart failure: Secondary | ICD-10-CM | POA: Diagnosis not present

## 2015-01-05 DIAGNOSIS — I213 ST elevation (STEMI) myocardial infarction of unspecified site: Secondary | ICD-10-CM

## 2015-01-05 DIAGNOSIS — I513 Intracardiac thrombosis, not elsewhere classified: Secondary | ICD-10-CM

## 2015-01-05 DIAGNOSIS — F1010 Alcohol abuse, uncomplicated: Secondary | ICD-10-CM

## 2015-01-05 LAB — BASIC METABOLIC PANEL
ANION GAP: 7 (ref 5–15)
BUN: 9 mg/dL (ref 6–20)
CALCIUM: 9.1 mg/dL (ref 8.9–10.3)
CHLORIDE: 101 mmol/L (ref 101–111)
CO2: 32 mmol/L (ref 22–32)
Creatinine, Ser: 1.02 mg/dL (ref 0.61–1.24)
GFR calc Af Amer: >60 mL/min (ref >60)
GFR calc non Af Amer: >60 mL/min (ref >60)
GLUCOSE: 90 mg/dL (ref 65–99)
POTASSIUM: 3.5 mmol/L (ref 3.5–5.1)
Sodium: 140 mmol/L (ref 135–145)

## 2015-01-05 LAB — CBC
HEMATOCRIT: 39.8 % (ref 39.0–52.0)
HEMOGLOBIN: 13.8 g/dL (ref 13.0–17.0)
MCH: 29.1 pg (ref 26.0–34.0)
MCHC: 34.7 g/dL (ref 30.0–36.0)
MCV: 84 fL (ref 78.0–100.0)
Platelets: 233 10*3/uL (ref 150–400)
RBC: 4.74 MIL/uL (ref 4.22–5.81)
RDW: 14.3 % (ref 11.5–15.5)
WBC: 5.1 10*3/uL (ref 4.0–10.5)

## 2015-01-05 LAB — POCT INR: INR: 4.1

## 2015-01-05 LAB — DIGOXIN LEVEL: Digoxin Level: 0.8 ng/mL (ref 0.8–2.0)

## 2015-01-05 MED ORDER — SACUBITRIL-VALSARTAN 49-51 MG PO TABS: 1.0000 | ORAL_TABLET | Freq: Two times a day (BID) | ORAL | Status: DC

## 2015-01-05 MED ORDER — FUROSEMIDE 20 MG PO TABS: 40.0000 mg | ORAL_TABLET | ORAL | Status: DC

## 2015-01-05 NOTE — Patient Instructions (Addendum)
Increase Entresto 49/51 twice a day  Decrease Lasix 40 mg every other day  Labs today will call if abnormal  1 month follow up  Do the following things EVERYDAY: 1. Weigh yourself in the morning before breakfast. Write it down and keep it in a log. 2. Take your medicines as prescribed 3. Eat low salt foods-Limit salt (sodium) to 2000 mg per day.  4. Stay as active as you can everyday 5. Limit all fluids for the day to less than 2 liters

## 2015-01-05 NOTE — Progress Notes (Signed)
Advanced Heart Failure Clinic Note   Patient ID: Robert Booth, male   DOB: [birth year], 44 y.o.   MRN: [id number] PCP: Primary HF Cardiologist: Dr Gala Romney  HPI: Robert Booth is a 44 y.o. male with hx of systolic HF with NICM secondary to prolonged ETOH abuse, EF 5-10% by Echo 11/11/14, and CKD stage 2.  Robert Booth presented to [facility] with hemoptysis. Found to have RLL PNA with procalcitonin > 8.0 Central line placed 8/12 with co-ox 27% and CVP 24 c/w cardiogenic shock. Started on milrinone and levophed. Diuresed well. Discharge weight 177   Robert Booth returns today for follow up. Last visit increase carvedilol to 6.25 BID. Overall continues to improve. Had one episode of dizziness with SOB approx 2 weeks ago, but nothing since. This happened when Robert Booth bent down and quickly stood up.  Otherwise, no SOB/PND/Orthopnea. No BRBPR, melena, or hematochezia.  Seen at INR clinic today and dose adjusted. Able to jog in place for 30 minutes.  Weight at home 179-181 pounds. Appetite continues to improve. Watches fluid and salt intake. Taking all medications. No alcohol intake. Currently unemployed, doesn't want to start anything until Robert Booth feels closer to normal.  Echo 10/13/14 EF 20% Echo 11/11/14 EF 5-10%. Severe RV dysfucntion  Labs 11/24/2014: K 4.4 Creatinine 1.05  Labs 11/16/2014 Dig level 0.2   ROS: All systems negative except as listed in HPI, PMH and Problem List.  SH:  Social History   Social History  . Marital Status: Single    Spouse Name: N/A  . Number of Children: 6  . Years of Education: N/A   Occupational History  . Unemployed    Social History Main Topics  . Smoking status: Former Smoker    Types: Cigarettes  . Smokeless tobacco: Former Neurosurgeon    Types: Chew     Comment: Was a rare smoker  . Alcohol Use: No  . Drug Use: No  . Sexual Activity: Yes   Other Topics Concern  . Not on file   Social History Narrative   Lives with mom.      FH:  Family History  Problem Relation Age of Onset    . Hypertension Mother   . Hypertension Father   . Heart attack Father 13    died  . Hypertension Sister   . Heart attack Paternal Grandfather 54    died    Past Medical History  Diagnosis Date  . Hypertension   . Alcoholism (HCC)     a. Sober since Feb 2016.  Marland Kitchen Anxiety   . NICM (nonischemic cardiomyopathy) (HCC)     a. 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  . LV (left ventricular) mural thrombus (HCC) 07/2014    in [place]  . Acute kidney injury [facility])     Current Outpatient Prescriptions  Medication Sig Dispense Refill  . carvedilol (COREG) 6.25 MG tablet Take 1 tablet (6.25 mg total) by mouth 2 (two) times daily with a meal. 60 tablet 3  . digoxin (LANOXIN) 0.25 MG tablet Take 1 tablet (0.25 mg total) by mouth daily. 30 tablet 0  . furosemide (LASIX) 40 MG tablet Take 1 tablet (40 mg total) by mouth daily. 40 mg by mouth daily, take an extra 40 mg dose 6 hrs later as needed for weight gain 30 tablet 0  . isosorbide-hydrALAZINE (BIDIL) 20-37.5 MG  per tablet Take 1 tablet by mouth 3 (three) times daily. 90 tablet 3  . sacubitril-valsartan (ENTRESTO) 24-26 MG Take 1 tablet by mouth 2 (two) times daily. 60 tablet 12  . spironolactone (ALDACTONE) 25 MG tablet Take 1 tablet (25 mg total) by mouth daily. 30 tablet 0  . warfarin (COUMADIN) 7.5 MG tablet Take 1 tablet (7.5 mg total) by mouth daily. 40 tablet 1   No current facility-administered medications for this encounter.    Filed Vitals:   01/05/15 0928  BP: 116/70  Pulse: 54  Weight: 183 lb 4 oz (83.122 kg)  SpO2: 100%    PHYSICAL EXAM:  General:  Well appearing. NAD HEENT: normal Neck: supple. JVP flat. Carotids 2+ bilaterally; no bruits. No lymphadenopathy or thryomegaly appreciated. Cor: PMI laterally displaced.  RRR. No rubs,  gallops or murmurs appreciated Lungs: CTA Abdomen: soft, NT, ND. No HSM. No bruits or masses. Good bowel sounds. Extremities: no cyanosis, clubbing, rash. No edema Neuro: alert & orientedx3, cranial nerves grossly intact. Moves all 4 extremities w/o difficulty. Affect pleasant.   ASSESSMENT & PLAN: 1. Chronic systolic HF -NICM  Cath ok performed [place] 2016. 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 EF 10% by ECHO 8/16. Severe RV dysfunction.  NYHA I. Volume status stable.  Decrease lasix to 40 mg every other day, can take extra as needed. Continue 25 mg spiro daily.  Continue carvedilol 6.25 mg twice a day. No increase with HR in 50s. Continue dig 0.25 mg daily.   Increase entresto 49/51 mg twice daily  Continue bidil 20/37.5 mg tid.  Repeat ECHO early December. If low, will need ICD. EKG 11/11/14 with narrow QRS.   Need to consider cMRI. 2. LV mural thrombus - on coumadin INR followed at coumadin clinic  3. H/o ETOH abuse quit 05/25/2014 was drinking 1/5 liquor daily 4. Recent PNA 8/16  BMET today. Follow up 1 month.  REDs Vest today 32.    Graciella Freer, PA-C  10:02 AM  Patient seen and examined with Otilio Saber, PA-C. We discussed all aspects of the encounter. I agree with the assessment and plan as stated above.   Robert Booth is much improved. Remains abstinent from ETOH. Agree with med changes as above. I did bedside echo and EF 30-35% range which is much improved.   Trasean Delima,MD 2:50 PM

## 2015-01-10 ENCOUNTER — Telehealth (HOSPITAL_COMMUNITY): Payer: Self-pay

## 2015-01-10 MED ORDER — ISOSORB DINITRATE-HYDRALAZINE 20-37.5 MG PO TABS: 1.0000 | ORAL_TABLET | Freq: Three times a day (TID) | ORAL | Status: DC

## 2015-01-10 NOTE — Telephone Encounter (Signed)
Returned patient call  Request a refill of Bidil sent to  [facility] pharmacy per patient request per Epic RX system

## 2015-01-13 ENCOUNTER — Telehealth (HOSPITAL_COMMUNITY): Payer: Self-pay

## 2015-01-13 NOTE — Telephone Encounter (Signed)
Patient concerned that on his new dose of Entresto 49/51 it says that he has 3 refills where on the lower dose it said that he had 11 refills.  Explained to him that is how it was put in the system.  He approved in the program through 12/2015

## 2015-01-19 ENCOUNTER — Ambulatory Visit (INDEPENDENT_AMBULATORY_CARE_PROVIDER_SITE_OTHER): Payer: No Typology Code available for payment source | Admitting: *Deleted

## 2015-01-19 DIAGNOSIS — Z5181 Encounter for therapeutic drug level monitoring: Secondary | ICD-10-CM

## 2015-01-19 DIAGNOSIS — I513 Intracardiac thrombosis, not elsewhere classified: Secondary | ICD-10-CM

## 2015-01-19 DIAGNOSIS — I213 ST elevation (STEMI) myocardial infarction of unspecified site: Secondary | ICD-10-CM

## 2015-01-19 LAB — POCT INR: INR: 2.8

## 2015-02-06 ENCOUNTER — Ambulatory Visit (HOSPITAL_COMMUNITY)
Admission: RE | Admit: 2015-02-06 | Discharge: 2015-02-06 | Disposition: A | Discharge status: Home / Self Care | Payer: Medicaid Other | Source: Ambulatory Visit | Attending: Internal Medicine | Admitting: Internal Medicine

## 2015-02-06 ENCOUNTER — Encounter (HOSPITAL_COMMUNITY): Payer: Self-pay | Admitting: Internal Medicine

## 2015-02-06 ENCOUNTER — Ambulatory Visit (INDEPENDENT_AMBULATORY_CARE_PROVIDER_SITE_OTHER): Payer: No Typology Code available for payment source | Admitting: *Deleted

## 2015-02-06 VITALS — BP 104/66 | HR 49 | Wt 184.2 lb

## 2015-02-06 DIAGNOSIS — I213 ST elevation (STEMI) myocardial infarction of unspecified site: Secondary | ICD-10-CM | POA: Insufficient documentation

## 2015-02-06 DIAGNOSIS — I513 Intracardiac thrombosis, not elsewhere classified: Secondary | ICD-10-CM

## 2015-02-06 DIAGNOSIS — Z5181 Encounter for therapeutic drug level monitoring: Secondary | ICD-10-CM

## 2015-02-06 DIAGNOSIS — I5022 Chronic systolic (congestive) heart failure: Secondary | ICD-10-CM | POA: Diagnosis present

## 2015-02-06 DIAGNOSIS — F1011 Alcohol abuse, in remission: Secondary | ICD-10-CM

## 2015-02-06 DIAGNOSIS — F1010 Alcohol abuse, uncomplicated: Secondary | ICD-10-CM | POA: Diagnosis not present

## 2015-02-06 LAB — POCT INR: INR: 2.9

## 2015-02-06 MED ORDER — WARFARIN SODIUM 7.5 MG PO TABS: 7.5000 mg | ORAL_TABLET | ORAL | Status: DC

## 2015-02-06 MED ORDER — FUROSEMIDE 20 MG PO TABS: 40.0000 mg | ORAL_TABLET | ORAL | Status: DC | PRN

## 2015-02-06 MED ORDER — CARVEDILOL 6.25 MG PO TABS: 3.1250 mg | ORAL_TABLET | Freq: Two times a day (BID) | ORAL | Status: DC

## 2015-02-06 NOTE — Patient Instructions (Signed)
Medication:  Decrease Carvedilol 3.125mg  twice a day  Change Lasix to as needed only  Procedures:  Echo in 4 weeks with follow up appt  Follow up:  4 weeks with Dr Gala Romney

## 2015-02-06 NOTE — Progress Notes (Signed)
Advanced Heart Failure Medication Review by a Pharmacist  Does the patient  feel that his/her medications are working for him/her?  yes  Has the patient been experiencing any side effects to the medications prescribed?  no  Does the patient measure his/her own blood pressure or blood glucose at home?  no   Does the patient have any problems obtaining medications due to transportation or finances?   no  Understanding of regimen: good Understanding of indications: good Potential of compliance: good Patient understands to avoid NSAIDs. Patient understands to avoid decongestants.  Issues to address at subsequent visits: None   Pharmacist comments:  Robert Booth is a pleasant 44 yo M presenting without a medication list but with good recall of his medications including dosages. He reports excellent compliance with all of his medications. He did not have any medication-related questions or concerns for me at this time.   Tyler Deis. Bonnye Fava, PharmD, BCPS, CPP Clinical Pharmacist Pager: [phone] Phone: [phone] 02/06/2015 10:40 AM      Time with patient: 4 minutes Preparation and documentation time: 2 minutes Total time: 6 minutes

## 2015-02-06 NOTE — Progress Notes (Signed)
Patient ID: Robert Booth, male   DOB: April 27, 1970, 44 y.o.   MRN: [id number]   Advanced Heart Failure Clinic Note   Patient ID: Robert Booth, male   DOB: January 10, 1971, 44 y.o.   MRN: [id number] PCP: Primary HF Cardiologist: Dr Gala Romney  HPI: Robert Booth is a 44 y.o. male with hx of systolic HF with NICM secondary to prolonged ETOH abuse, EF 5-10% by Echo 11/11/14, and CKD stage 2.  He presented to [facility] with hemoptysis. Found to have RLL PNA and cardiogenic shock. Treated briefly with milrinone and levophed. Diuresed well. Discharge weight 177   He returns today for follow up. He is doing great.  Last visit increase entresto to 49-51 mg twice a day and lasix was cut back to every other day. We also did bedside echo with EF 30-35%  Can jog for 1 hour 5 days a week. Taking all medications. Weight at home 179-180 pounds.  No alcohol intake. Currently unemployed, doesn't want to start anything until he feels closer to normal.  Echo 10/13/14 EF 20% Echo 11/11/14 EF 5-10%. Severe RV dysfucntion  Labs 11/24/2014: K 4.4 Creatinine 1.05  Labs 11/16/2014 Dig level 0.2  Labs 01/01/15 : K 3.5 Creatinie 1.02   ROS: All systems negative except as listed in HPI, PMH and Problem List.  SH:  Social History   Social History  . Marital Status: Single    Spouse Name: N/A  . Number of Children: 6  . Years of Education: N/A   Occupational History  . Unemployed    Social History Main Topics  . Smoking status: Former Smoker    Types: Cigarettes  . Smokeless tobacco: Former Neurosurgeon    Types: Chew     Comment: Was a rare smoker  . Alcohol Use: No  . Drug Use: No  . Sexual Activity: Yes   Other Topics Concern  . Not on file   Social History Narrative   Lives with mom.      FH:  Family History  Problem Relation Age of Onset  . Hypertension Mother   . Hypertension Father   . Heart attack Father 43    died  . Hypertension Sister   . Heart attack Paternal Grandfather 62    died    Past  Medical History  Diagnosis Date  . Hypertension   . Alcoholism (HCC)     a. Sober since Feb 2016.  Marland Kitchen Anxiety   . NICM (nonischemic cardiomyopathy) (HCC)     a. 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  . LV (left ventricular) mural thrombus (HCC) 07/2014    in [place]  . Acute kidney injury [facility])     Current Outpatient Prescriptions  Medication Sig Dispense Refill  . carvedilol (COREG) 6.25 MG tablet Take 1 tablet (6.25 mg total) by mouth 2 (two) times daily with a meal. 60 tablet 3  . digoxin (LANOXIN) 0.25 MG tablet Take 1 tablet (0.25 mg total) by mouth daily. 30 tablet 0  . furosemide (LASIX) 20 MG tablet Take 2 tablets (40 mg total) by mouth every other day. 90 tablet 3  . isosorbide-hydrALAZINE (BIDIL) 20-37.5 MG tablet Take 1 tablet by mouth 3 (three) times daily. 90 tablet 3  . sacubitril-valsartan (ENTRESTO) 49-51 MG Take 1 tablet by mouth 2 (two) times daily. 60 tablet 3  . spironolactone (  ALDACTONE) 25 MG tablet Take 1 tablet (25 mg total) by mouth daily. 30 tablet 0  . warfarin (COUMADIN) 7.5 MG tablet Take 1 tablet (7.5 mg total) by mouth as directed. Take as directed by [facility]madin Clinic 40 tablet 1   No current facility-administered medications for this encounter.    Filed Vitals:   02/06/15 0949  BP: 104/66  Pulse: 49  Weight: 184 lb 4 oz (83.575 kg)  SpO2: 98%    PHYSICAL EXAM:  General:  Well appearing. NAD HEENT: normal Neck: supple. JVP flat. Carotids 2+ bilaterally; no bruits. No lymphadenopathy or thryomegaly appreciated. Cor: PMI laterally displaced.  RRR. No rubs, gallops or murmurs appreciated Lungs: CTA Abdomen: soft, NT, ND. No HSM. No bruits or masses. Good bowel sounds. Extremities: no cyanosis, clubbing, rash. No edema Neuro: alert & orientedx3, cranial nerves  grossly intact. Moves all 4 extremities w/o difficulty. Affect pleasant.  EKG: Sinus Brady 43 bpm.   ASSESSMENT & PLAN: 1. Chronic systolic HF -NICM  Cath ok performed [place] 2016. 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 EF 10% by ECHO 8/16. Severe RV dysfunction. Bedside echo 10/16 EF 30-35% NYHA I. Volume status stable.  Change lasix to 40 mg as needed.  Continue 25 mg spiro daily.  HR 43. Cut back carvedilol to 3.125 mg twice a day.  Continue dig 0.25 mg daily.  Continue entresto 49/51 mg twice daily  Continue bidil 20/37.5 mg tid.  Repeat ECHO early December. No ICD with NYHA I symptoms.   Need to consider cMRI. 2. LV mural thrombus - on coumadin INR followed at coumadin clinic  3. H/o ETOH abuse quit 05/25/2014 was drinking 1/5 liquor daily. Congratulated on abstinence.   Follow up in 1 month   Amy Clegg, NP-C  10:46 AM  Patient seen and examined with Tonye Becket, NP. We discussed all aspects of the encounter. I agree with the assessment and plan as stated above.   He is doing very well. NYHA I. Bedside echo at last visit shows improving EF. HR low today so will cut back carvedilol. Continue other meds. Will repeat echo at next visit. As EF improves can stop warfarin. Congratulated him on continued abstinence from ETOH.  Yvette Loveless,MD 1:10 PM

## 2015-03-06 ENCOUNTER — Ambulatory Visit (INDEPENDENT_AMBULATORY_CARE_PROVIDER_SITE_OTHER): Payer: No Typology Code available for payment source | Admitting: *Deleted

## 2015-03-06 DIAGNOSIS — Z5181 Encounter for therapeutic drug level monitoring: Secondary | ICD-10-CM

## 2015-03-06 DIAGNOSIS — I213 ST elevation (STEMI) myocardial infarction of unspecified site: Secondary | ICD-10-CM

## 2015-03-06 DIAGNOSIS — I513 Intracardiac thrombosis, not elsewhere classified: Secondary | ICD-10-CM

## 2015-03-06 LAB — POCT INR: INR: 2.8

## 2015-03-07 ENCOUNTER — Other Ambulatory Visit (HOSPITAL_COMMUNITY): Payer: Self-pay | Admitting: Internal Medicine

## 2015-03-09 ENCOUNTER — Ambulatory Visit (HOSPITAL_BASED_OUTPATIENT_CLINIC_OR_DEPARTMENT_OTHER)
Admission: RE | Admit: 2015-03-09 | Discharge: 2015-03-09 | Disposition: A | Discharge status: Home / Self Care | Payer: Medicaid Other | Source: Ambulatory Visit | Attending: Internal Medicine | Admitting: Internal Medicine

## 2015-03-09 ENCOUNTER — Encounter (HOSPITAL_COMMUNITY): Payer: Self-pay | Admitting: Internal Medicine

## 2015-03-09 ENCOUNTER — Ambulatory Visit (HOSPITAL_COMMUNITY)
Admission: RE | Admit: 2015-03-09 | Discharge: 2015-03-09 | Disposition: A | Discharge status: Home / Self Care | Payer: Medicaid Other | Source: Ambulatory Visit | Attending: Family Medicine | Admitting: Family Medicine

## 2015-03-09 VITALS — BP 114/74 | HR 62 | Wt 181.2 lb

## 2015-03-09 DIAGNOSIS — I517 Cardiomegaly: Secondary | ICD-10-CM | POA: Insufficient documentation

## 2015-03-09 DIAGNOSIS — I340 Nonrheumatic mitral (valve) insufficiency: Secondary | ICD-10-CM | POA: Diagnosis not present

## 2015-03-09 DIAGNOSIS — I5022 Chronic systolic (congestive) heart failure: Secondary | ICD-10-CM | POA: Diagnosis not present

## 2015-03-09 DIAGNOSIS — F1010 Alcohol abuse, uncomplicated: Secondary | ICD-10-CM | POA: Diagnosis not present

## 2015-03-09 DIAGNOSIS — I213 ST elevation (STEMI) myocardial infarction of unspecified site: Secondary | ICD-10-CM

## 2015-03-09 DIAGNOSIS — I071 Rheumatic tricuspid insufficiency: Secondary | ICD-10-CM | POA: Insufficient documentation

## 2015-03-09 DIAGNOSIS — I509 Heart failure, unspecified: Secondary | ICD-10-CM | POA: Diagnosis present

## 2015-03-09 DIAGNOSIS — F1011 Alcohol abuse, in remission: Secondary | ICD-10-CM

## 2015-03-09 DIAGNOSIS — I513 Intracardiac thrombosis, not elsewhere classified: Secondary | ICD-10-CM

## 2015-03-09 MED ORDER — SACUBITRIL-VALSARTAN 97-103 MG PO TABS: 1.0000 | ORAL_TABLET | Freq: Two times a day (BID) | ORAL | Status: DC

## 2015-03-09 MED ORDER — CARVEDILOL 3.125 MG PO TABS: 3.1250 mg | ORAL_TABLET | Freq: Two times a day (BID) | ORAL | Status: DC

## 2015-03-09 NOTE — Progress Notes (Signed)
  Echocardiogram 2D Echocardiogram has been performed.  Dorothey Baseman 03/09/2015, 10:47 AM

## 2015-03-09 NOTE — Patient Instructions (Addendum)
Stop Digoxin  Stop Coumadin  Increase Entresto to 97/103 mg Twice daily   Your physician recommends that you schedule a follow-up appointment in: 2 months

## 2015-03-09 NOTE — Progress Notes (Signed)
\  Advanced Heart Failure Clinic Note   Patient ID: Robert Booth, male   DOB: 1970-08-08, 44 y.o.   MRN: [id number] PCP: Primary HF Cardiologist: Dr Gala Romney  HPI: Robert Booth is a 44 y.o. male with hx of systolic HF with NICM secondary to prolonged ETOH abuse, EF 5-10% by Echo 11/11/14, and CKD stage 2.  He presented to [facility] with hemoptysis. Found to have RLL PNA and cardiogenic shock. Treated briefly with milrinone and levophed. Diuresed well. Discharge weight 177   He returns today for follow up. He is doing great.  Last visit carvedilol cut back due to low HR (43).  Still exercising every day running laps around baseball field or jogging in place for 1 hour.  Taking all medications. Weight at home 179-180 pounds.  No alcohol intake. No need for lasix.   Echo today EF ~35% maybe 40%. No LV clote  Echo 10/13/14 EF 20% Echo 11/11/14 EF 5-10%. Severe RV dysfucntion  Labs 11/24/2014: K 4.4 Creatinine 1.05  Labs 11/16/2014 Dig level 0.2  Labs 01/01/15 : K 3.5 Creatinie 1.02   ROS: All systems negative except as listed in HPI, PMH and Problem List.  SH:  Social History   Social History  . Marital Status: Single    Spouse Name: N/A  . Number of Children: 6  . Years of Education: N/A   Occupational History  . Unemployed    Social History Main Topics  . Smoking status: Former Smoker    Types: Cigarettes  . Smokeless tobacco: Former Neurosurgeon    Types: Chew     Comment: Was a rare smoker  . Alcohol Use: No  . Drug Use: No  . Sexual Activity: Yes   Other Topics Concern  . Not on file   Social History Narrative   Lives with mom.      FH:  Family History  Problem Relation Age of Onset  . Hypertension Mother   . Hypertension Father   . Heart attack Father 82    died  . Hypertension Sister   . Heart attack Paternal Grandfather 65    died    Past Medical History  Diagnosis Date  . Hypertension   . Alcoholism (HCC)     a. Sober since Feb 2016.  Marland Kitchen Anxiety   . NICM  (nonischemic cardiomyopathy) (HCC)     a. 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  . LV (left ventricular) mural thrombus (HCC) 07/2014    in [place]  . Acute kidney injury [facility])     Current Outpatient Prescriptions  Medication Sig Dispense Refill  . carvedilol (COREG) 6.25 MG tablet Take 0.5 tablets (3.125 mg total) by mouth 2 (two) times daily. 180 tablet 3  . digoxin (LANOXIN) 0.25 MG tablet TAKE 1 TABLET BY MOUTH ONCE DAILY 34 tablet 2  . furosemide (LASIX) 20 MG tablet Take 2 tablets (40 mg total) by mouth as needed. 90 tablet 3  . sacubitril-valsartan (ENTRESTO) 49-51 MG Take 1 tablet by mouth 2 (two) times daily. 60 tablet 3  . spironolactone (ALDACTONE) 25 MG tablet TAKE 1 TABLET BY MOUTH ONCE DAILY 34 tablet 2  . warfarin (COUMADIN) 7.5 MG tablet Take 1 tablet (7.5 mg total) by mouth as directed. Take as directed by Coumadin Clinic 40 tablet 1  . isosorbide-hydrALAZINE (BIDIL) 20-37.5 MG tablet  Take 1 tablet by mouth 3 (three) times daily. 90 tablet 3   No current facility-administered medications for this encounter.    Filed Vitals:   03/09/15 1117  BP: 114/74  Pulse: 62  Weight: 181 lb 4 oz (82.214 kg)  SpO2: 99%    PHYSICAL EXAM:  General:  Well appearing. NAD HEENT: normal Neck: supple. JVP flat. Carotids 2+ bilaterally; no bruits. No lymphadenopathy or thryomegaly appreciated. Cor: PMI laterally displaced.  RRR. No rubs, gallops or murmurs appreciated Lungs: CTA Abdomen: soft, NT, ND. No HSM. No bruits or masses. Good bowel sounds. Extremities: no cyanosis, clubbing, rash. No edema Neuro: alert & orientedx3, cranial nerves grossly intact. Moves all 4 extremities w/o difficulty. Affect pleasant.   ASSESSMENT & PLAN: 1. Chronic systolic HF -NICM  Cath ok performed [place] 2016.  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 EF 10% by ECHO 8/16. Severe RV dysfunction. Echo 12/16 EF 35-40% -NYHA I. Volume status stable.  -Continue lasix prn - Continue 25 mg spiro daily.  - Continue carvedilol 3.125 mg twice a day. - cut back due to low HR - Increase entresto 97/103 mg twice daily  - Stop digoxin - Continue bidil 20/37.5 mg tid.  - No ICD with NYHA I symptoms and improved EF - BMET 2 weeks 2. LV mural thrombus - resolved. Stop coumadin 3. H/o ETOH abuse quit 05/25/2014 was drinking 1/5 liquor daily. Congratulated on abstinence.   Robert Dismore,MD 11:43 AM

## 2015-03-09 NOTE — Addendum Note (Signed)
Encounter addended by: Noralee Space, RN on: 03/09/2015 12:08 PM<BR>     Documentation filed: Medications, Patient Instructions Section, Orders

## 2015-03-10 ENCOUNTER — Telehealth (HOSPITAL_COMMUNITY): Payer: Self-pay | Admitting: *Deleted

## 2015-03-10 NOTE — Telephone Encounter (Signed)
Medication Samples have been provided to the patient.  Drug name: Kathyrn Lass: 28  LOT: [id number]  Exp.Date: 05/2015  Pt instructed to take 2 tablets twice a day until his 90 day supply arrives  The patient has been instructed regarding the correct time, dose, and frequency of taking this medication, including desired effects and most common side effects.   Mercer Pod D 9:59 AM 03/10/2015

## 2015-03-23 ENCOUNTER — Ambulatory Visit (HOSPITAL_COMMUNITY)
Admission: RE | Admit: 2015-03-23 | Discharge: 2015-03-23 | Disposition: A | Discharge status: Home / Self Care | Payer: No Typology Code available for payment source | Source: Ambulatory Visit | Attending: Internal Medicine | Admitting: Internal Medicine

## 2015-03-23 DIAGNOSIS — I5022 Chronic systolic (congestive) heart failure: Secondary | ICD-10-CM

## 2015-03-23 LAB — BASIC METABOLIC PANEL
Anion gap: 7 (ref 5–15)
BUN: 9 mg/dL (ref 6–20)
CALCIUM: 9.6 mg/dL (ref 8.9–10.3)
CHLORIDE: 105 mmol/L (ref 101–111)
CO2: 29 mmol/L (ref 22–32)
CREATININE: 1.07 mg/dL (ref 0.61–1.24)
GFR calc non Af Amer: >60 mL/min (ref >60)
Glucose, Bld: 59 mg/dL — ABNORMAL LOW (ref 65–99)
Potassium: 4.1 mmol/L (ref 3.5–5.1)
SODIUM: 141 mmol/L (ref 135–145)

## 2015-04-05 MED FILL — BIDIL TABLET: 20-37.5 | 30 days supply | Qty: 90 | Fill #1

## 2015-04-06 ENCOUNTER — Telehealth (HOSPITAL_COMMUNITY): Payer: Self-pay | Admitting: *Deleted

## 2015-04-06 ENCOUNTER — Telehealth (HOSPITAL_COMMUNITY): Payer: Self-pay

## 2015-04-06 ENCOUNTER — Encounter (HOSPITAL_COMMUNITY): Payer: Self-pay | Admitting: *Deleted

## 2015-04-06 NOTE — Telephone Encounter (Signed)
Patient called to request out of work note from Dr. Gala Romney.  Waiting to be signed, will call patient when ready to pick up.  Ave Filter

## 2015-04-06 NOTE — Telephone Encounter (Signed)
Pt left VM 1/4 stating he needed a letter stating he is under our care and out of work at this time.  Letter done pt will p/u

## 2015-04-12 MED FILL — CARVEDILOL 3.125 MG TABLET: 3.125 | 30 days supply | Qty: 60 | Fill #1

## 2015-04-12 MED FILL — SPIRONOLACTONE 25 MG TABLET: 25 | 34 days supply | Qty: 34 | Fill #1

## 2015-04-28 ENCOUNTER — Encounter (HOSPITAL_COMMUNITY): Payer: Self-pay | Admitting: *Deleted

## 2015-05-08 MED FILL — BIDIL TABLET: 20-37.5 | 30 days supply | Qty: 90 | Fill #2

## 2015-05-08 MED FILL — SPIRONOLACTONE 25 MG TABLET: 25 | 34 days supply | Qty: 34 | Fill #2

## 2015-05-08 MED FILL — CARVEDILOL 3.125 MG TABLET: 3.125 | 30 days supply | Qty: 60 | Fill #2

## 2015-05-09 ENCOUNTER — Ambulatory Visit (HOSPITAL_COMMUNITY)
Admission: RE | Admit: 2015-05-09 | Discharge: 2015-05-09 | Disposition: A | Discharge status: Home / Self Care | Payer: Medicaid Other | Source: Ambulatory Visit | Attending: Internal Medicine | Admitting: Internal Medicine

## 2015-05-09 ENCOUNTER — Encounter (HOSPITAL_COMMUNITY): Payer: Self-pay | Admitting: Internal Medicine

## 2015-05-09 VITALS — BP 110/64 | HR 78 | Wt 182.5 lb

## 2015-05-09 DIAGNOSIS — Z8249 Family history of ischemic heart disease and other diseases of the circulatory system: Secondary | ICD-10-CM | POA: Diagnosis not present

## 2015-05-09 DIAGNOSIS — F419 Anxiety disorder, unspecified: Secondary | ICD-10-CM | POA: Insufficient documentation

## 2015-05-09 DIAGNOSIS — N182 Chronic kidney disease, stage 2 (mild): Secondary | ICD-10-CM | POA: Diagnosis not present

## 2015-05-09 DIAGNOSIS — Z86718 Personal history of other venous thrombosis and embolism: Secondary | ICD-10-CM | POA: Insufficient documentation

## 2015-05-09 DIAGNOSIS — I428 Other cardiomyopathies: Secondary | ICD-10-CM | POA: Insufficient documentation

## 2015-05-09 DIAGNOSIS — I5022 Chronic systolic (congestive) heart failure: Secondary | ICD-10-CM | POA: Insufficient documentation

## 2015-05-09 DIAGNOSIS — Z79899 Other long term (current) drug therapy: Secondary | ICD-10-CM | POA: Insufficient documentation

## 2015-05-09 DIAGNOSIS — I130 Hypertensive heart and chronic kidney disease with heart failure and stage 1 through stage 4 chronic kidney disease, or unspecified chronic kidney disease: Secondary | ICD-10-CM | POA: Insufficient documentation

## 2015-05-09 DIAGNOSIS — F1020 Alcohol dependence, uncomplicated: Secondary | ICD-10-CM | POA: Insufficient documentation

## 2015-05-09 DIAGNOSIS — Z87891 Personal history of nicotine dependence: Secondary | ICD-10-CM | POA: Diagnosis not present

## 2015-05-09 NOTE — Patient Instructions (Signed)
Will refer you to our social worker [name] for resources. [phone]  Follow up 4 months.  Do the following things EVERYDAY: 1) Weigh yourself in the morning before breakfast. Write it down and keep it in a log. 2) Take your medicines as prescribed 3) Eat low salt foods-Limit salt (sodium) to 2000 mg per day.  4) Stay as active as you can everyday 5) Limit all fluids for the day to less than 2 liters

## 2015-05-09 NOTE — Progress Notes (Signed)
Patient ID: Robert Booth, male   DOB: Apr 08, 1970, 45 y.o.   MRN: [id number] \  Advanced Heart Failure Clinic Note   Patient ID: Robert Booth, male   DOB: 08/02/1970, 45 y.o.   MRN: [id number] PCP: Primary HF Cardiologist: Dr Robert Booth  HPI: Robert Booth is a 45 y.o. male with hx of systolic HF with NICM secondary to prolonged ETOH abuse, EF 5-10% by Echo 11/11/14, and CKD stage 2.  He presented to [facility] with hemoptysis. Found to have RLL PNA and cardiogenic shock. Treated briefly with milrinone and levophed. Diuresed well. Discharge weight 177   He returns today for follow up. He is doing great.  Previously carvedilol cut back due to low HR (43).  Still exercising every day doing push-ups and running laps around baseball field or jogging in place for 1 hour - 5 days a week.  Taking all medications except Celexa. Struggling with anxiety. Weight at home 179-180 pounds.  No alcohol intake. No need for lasix.    Echo 12/16 EF 35 - 40% ~35% No LV clot  Echo 10/13/14 EF 20% Echo 11/11/14 EF 5-10%. Severe RV dysfucntion  Labs 11/24/2014: K 4.4 Creatinine 1.05  Labs 11/16/2014 Dig level 0.2  Labs 01/01/15 : K 3.5 Creatinie 1.02   ROS: All systems negative except as listed in HPI, PMH and Problem List.  SH:  Social History   Social History  . Marital Status: Single    Spouse Name: N/A  . Number of Children: 6  . Years of Education: N/A   Occupational History  . Unemployed    Social History Main Topics  . Smoking status: Former Smoker    Types: Cigarettes  . Smokeless tobacco: Former Neurosurgeon    Types: Chew     Comment: Was a rare smoker  . Alcohol Use: No  . Drug Use: No  . Sexual Activity: Yes   Other Topics Concern  . Not on file   Social History Narrative   Lives with mom.      FH:  Family History  Problem Relation Age of Onset  . Hypertension Mother   . Hypertension Father   . Heart attack Father 69    died  . Hypertension Sister   . Heart attack Paternal Grandfather  14    died    Past Medical History  Diagnosis Date  . Hypertension   . Alcoholism (HCC)     a. Sober since Feb 2016.  Marland Kitchen Anxiety   . NICM (nonischemic cardiomyopathy) (HCC)     a. 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  . LV (left ventricular) mural thrombus (HCC) 07/2014    in [place]  . Acute kidney injury [facility])     Current Outpatient Prescriptions  Medication Sig Dispense Refill  . carvedilol (COREG) 3.125 MG tablet Take 1 tablet (3.125 mg total) by mouth 2 (two) times daily. 60 tablet 3  . isosorbide-hydrALAZINE (BIDIL) 20-37.5 MG tablet Take 1 tablet by mouth 3 (three) times daily. 90 tablet 3  . sacubitril-valsartan (ENTRESTO) 97-103 MG Take 1 tablet by mouth 2 (two) times daily. 60 tablet 11  . spironolactone (ALDACTONE) 25 MG tablet TAKE 1 TABLET BY MOUTH ONCE DAILY 34 tablet 2  . furosemide (LASIX) 20 MG tablet Take 2 tablets (40 mg total) by mouth as needed. (Patient not taking: Reported on  05/09/2015) 90 tablet 3   No current facility-administered medications for this encounter.    Filed Vitals:   05/09/15 0903  BP: 110/64  Pulse: 78  Weight: 182 lb 8 oz (82.781 kg)  SpO2: 97%    PHYSICAL EXAM:  General:  Well appearing. NAD HEENT: normal Neck: supple. JVP flat. Carotids 2+ bilaterally; no bruits. No lymphadenopathy or thryomegaly appreciated. Cor: PMI laterally displaced.  RRR. No rubs, gallops or murmurs appreciated Lungs: CTA Abdomen: soft, NT, ND. No HSM. No bruits or masses. Good bowel sounds. Extremities: no cyanosis, clubbing, rash. No edema Neuro: alert & orientedx3, cranial nerves grossly intact. Moves all 4 extremities w/o difficulty. Affect pleasant.   ASSESSMENT & PLAN: 1. Chronic systolic HF -NICM  Cath ok performed Robert Booth 2016. 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 EF  10% by ECHO 8/16. Severe RV dysfunction. Echo 12/16 EF 35-40% - Continues to do well. NYHA I. Volume status stable.  -Continue lasix prn (hasn't taken in 2 months) - Continue 25 mg spiro daily.  - Continue carvedilol 3.125 mg twice a day. - cut back due to low HR - Continue entresto 97/103 mg twice daily  - Stop digoxin - Continue bidil 1 tab tid.  - No ICD with NYHA I symptoms and improved EF. -Repeat echo in 6-9 months.  - Refer to Robert Booth HF exercise program. 2. LV mural thrombus - resolved. Off coumadin 3. H/o ETOH abuse quit 05/25/2014 was drinking 1/5 liquor daily.  4. Anxiety - feels like it is coming back. Previously would drink ETOH. Now trying to manage with exercise. Will ask him to see SW.    Robert Hoffmann,MD 9:31 AM

## 2015-05-09 NOTE — Addendum Note (Signed)
Encounter addended by: Chyrl Civatte, RN on: 05/09/2015  9:47 AM<BR>     Documentation filed: Patient Instructions Section

## 2015-06-06 ENCOUNTER — Telehealth (HOSPITAL_COMMUNITY): Payer: Self-pay | Admitting: *Deleted

## 2015-06-06 NOTE — Telephone Encounter (Signed)
Pt left a voicemail stating her has a G.I bug and wanted to know if he could take Imodium AD. Spoke with our pharmacist Cicero Duck she said it was ok for patient to take as long as he did not have cdiff and that he followed the directions on the box. I advised pt.

## 2015-07-03 ENCOUNTER — Other Ambulatory Visit (HOSPITAL_COMMUNITY): Payer: Self-pay | Admitting: Internal Medicine

## 2015-07-03 MED FILL — CARVEDILOL 3.125 MG TABLET: 3.125 | 30 days supply | Qty: 60 | Fill #3

## 2015-07-03 MED FILL — BIDIL TABLET: 20-37.5 | 30 days supply | Qty: 90 | Fill #3

## 2015-07-04 MED FILL — SPIRONOLACTONE 25 MG TABLET: 25 | 34 days supply | Qty: 34 | Fill #0

## 2015-09-11 ENCOUNTER — Inpatient Hospital Stay (HOSPITAL_COMMUNITY)
Admission: RE | Admit: 2015-09-11 | Payer: No Typology Code available for payment source | Source: Ambulatory Visit | Admitting: Internal Medicine

## 2015-10-04 ENCOUNTER — Other Ambulatory Visit (HOSPITAL_COMMUNITY): Payer: Self-pay | Admitting: Internal Medicine

## 2015-10-04 ENCOUNTER — Other Ambulatory Visit (HOSPITAL_COMMUNITY): Payer: Self-pay | Admitting: Cardiology

## 2015-10-04 MED FILL — BIDIL TABLET: 20-37.5 | 30 days supply | Qty: 90 | Fill #0

## 2015-10-04 MED FILL — CARVEDILOL 3.125 MG TABLET: 3.125 | 30 days supply | Qty: 60 | Fill #0

## 2015-10-04 MED FILL — SPIRONOLACTONE 25 MG TABLET: 25 | 34 days supply | Qty: 34 | Fill #1

## 2015-10-09 ENCOUNTER — Telehealth (HOSPITAL_COMMUNITY): Payer: Self-pay

## 2015-10-09 NOTE — Telephone Encounter (Signed)
Patient called CHF triage line to request another letter to state he is still unable to work. However, in looking at most recent OV note with Dr. Gala Romney, patient was NYHA I with good volume statu,s improved EF, doing push ups, running laps, and reported feeling great. Will forward this to provider before creating another letter to sign to see if this is something he agrees is appropriate.  Ave Filter

## 2015-10-11 ENCOUNTER — Encounter (HOSPITAL_COMMUNITY): Payer: Self-pay | Admitting: *Deleted

## 2015-10-11 ENCOUNTER — Telehealth (HOSPITAL_COMMUNITY): Payer: Self-pay | Admitting: *Deleted

## 2015-10-11 NOTE — Telephone Encounter (Signed)
Letter left at front desk for pt to pickup.

## 2015-10-15 NOTE — Telephone Encounter (Signed)
EF improving. NYHA I. Should be able to return to work. Needs f/u echo.

## 2015-10-30 MED FILL — BIDIL TABLET: 20-37.5 | 30 days supply | Qty: 90 | Fill #1

## 2015-10-30 MED FILL — CARVEDILOL 3.125 MG TABLET: 3.125 | 30 days supply | Qty: 60 | Fill #1

## 2015-10-30 MED FILL — SPIRONOLACTONE 25 MG TABLET: 25 | 34 days supply | Qty: 34 | Fill #2

## 2015-11-14 ENCOUNTER — Telehealth (HOSPITAL_COMMUNITY): Payer: Self-pay

## 2015-11-14 NOTE — Telephone Encounter (Signed)
Demetra Shiner with [facility] called to let CHF clinic know forms were being faxed to our office to complete in order to get telemonitoring services set up for patient. Will look for forms to complete and have signed by provider to return fax to her office.  Ave Filter, RN

## 2015-11-17 ENCOUNTER — Other Ambulatory Visit (HOSPITAL_COMMUNITY): Payer: Self-pay

## 2015-11-17 DIAGNOSIS — I5022 Chronic systolic (congestive) heart failure: Secondary | ICD-10-CM

## 2015-11-23 ENCOUNTER — Telehealth (HOSPITAL_COMMUNITY): Payer: Self-pay | Admitting: *Deleted

## 2015-11-23 NOTE — Telephone Encounter (Signed)
Robert Shiner, RN case manager with [facility] called about pt's wt parameters.  Our orders are for wt goal of 175-185, however pt has been weighing in the 190s more recently.  Gave ok to change wt parameters up to 200 lb

## 2015-11-29 ENCOUNTER — Other Ambulatory Visit (HOSPITAL_COMMUNITY): Payer: Self-pay | Admitting: Internal Medicine

## 2015-11-29 MED FILL — FUROSEMIDE 20 MG TABLET: 20 | 30 days supply | Qty: 30 | Fill #1

## 2015-11-29 MED FILL — CARVEDILOL 3.125 MG TABLET: 3.125 | 30 days supply | Qty: 60 | Fill #2

## 2015-11-29 MED FILL — SPIRONOLACTONE 25 MG TABLET: 25 | 34 days supply | Qty: 34 | Fill #0

## 2015-11-29 MED FILL — BIDIL TABLET: 20-37.5 | 30 days supply | Qty: 90 | Fill #2

## 2015-11-30 ENCOUNTER — Ambulatory Visit (HOSPITAL_COMMUNITY)
Admission: RE | Admit: 2015-11-30 | Discharge: 2015-11-30 | Disposition: A | Discharge status: Home / Self Care | Payer: Medicaid Other | Source: Ambulatory Visit | Attending: Internal Medicine | Admitting: Internal Medicine

## 2015-11-30 ENCOUNTER — Ambulatory Visit (HOSPITAL_BASED_OUTPATIENT_CLINIC_OR_DEPARTMENT_OTHER)
Admission: RE | Admit: 2015-11-30 | Discharge: 2015-11-30 | Disposition: A | Discharge status: Home / Self Care | Payer: Medicaid Other | Source: Ambulatory Visit | Attending: Family Medicine | Admitting: Family Medicine

## 2015-11-30 ENCOUNTER — Telehealth (HOSPITAL_COMMUNITY): Payer: Self-pay

## 2015-11-30 VITALS — BP 130/78 | HR 60 | Wt 195.8 lb

## 2015-11-30 DIAGNOSIS — I428 Other cardiomyopathies: Secondary | ICD-10-CM | POA: Insufficient documentation

## 2015-11-30 DIAGNOSIS — I5022 Chronic systolic (congestive) heart failure: Secondary | ICD-10-CM | POA: Diagnosis not present

## 2015-11-30 DIAGNOSIS — F1021 Alcohol dependence, in remission: Secondary | ICD-10-CM | POA: Diagnosis not present

## 2015-11-30 DIAGNOSIS — F1010 Alcohol abuse, uncomplicated: Secondary | ICD-10-CM | POA: Diagnosis not present

## 2015-11-30 DIAGNOSIS — Z8249 Family history of ischemic heart disease and other diseases of the circulatory system: Secondary | ICD-10-CM | POA: Diagnosis not present

## 2015-11-30 DIAGNOSIS — I130 Hypertensive heart and chronic kidney disease with heart failure and stage 1 through stage 4 chronic kidney disease, or unspecified chronic kidney disease: Secondary | ICD-10-CM | POA: Insufficient documentation

## 2015-11-30 DIAGNOSIS — N182 Chronic kidney disease, stage 2 (mild): Secondary | ICD-10-CM | POA: Insufficient documentation

## 2015-11-30 DIAGNOSIS — I10 Essential (primary) hypertension: Secondary | ICD-10-CM | POA: Diagnosis not present

## 2015-11-30 DIAGNOSIS — Z79899 Other long term (current) drug therapy: Secondary | ICD-10-CM | POA: Diagnosis not present

## 2015-11-30 DIAGNOSIS — Z87891 Personal history of nicotine dependence: Secondary | ICD-10-CM | POA: Diagnosis not present

## 2015-11-30 DIAGNOSIS — F1011 Alcohol abuse, in remission: Secondary | ICD-10-CM

## 2015-11-30 LAB — BASIC METABOLIC PANEL
ANION GAP: 8 (ref 5–15)
BUN: 13 mg/dL (ref 6–20)
CALCIUM: 9.8 mg/dL (ref 8.9–10.3)
CO2: 26 mmol/L (ref 22–32)
Chloride: 106 mmol/L (ref 101–111)
Creatinine, Ser: 1.06 mg/dL (ref 0.61–1.24)
GFR calc Af Amer: >60 mL/min (ref >60)
GFR calc non Af Amer: >60 mL/min (ref >60)
GLUCOSE: 73 mg/dL (ref 65–99)
Potassium: 4.4 mmol/L (ref 3.5–5.1)
Sodium: 140 mmol/L (ref 135–145)

## 2015-11-30 LAB — BRAIN NATRIURETIC PEPTIDE: B Natriuretic Peptide: 13.7 pg/mL (ref 0.0–100.0)

## 2015-11-30 MED ORDER — SACUBITRIL-VALSARTAN 97-103 MG PO TABS: 1.0000 | ORAL_TABLET | Freq: Two times a day (BID) | ORAL | 11 refills | Status: DC

## 2015-11-30 MED ORDER — ISOSORB DINITRATE-HYDRALAZINE 20-37.5 MG PO TABS: 2.0000 | ORAL_TABLET | Freq: Three times a day (TID) | ORAL | 3 refills | Status: DC

## 2015-11-30 NOTE — Progress Notes (Signed)
  Echocardiogram 2D Echocardiogram has been performed.  Robert Booth 11/30/2015, 9:50 AM

## 2015-11-30 NOTE — Patient Instructions (Signed)
Increase Bidil to 2 tabs three times a day  Labs today  We will contact you to follow up on 6 months

## 2015-11-30 NOTE — Addendum Note (Signed)
Encounter addended by: Modesta Messing, CMA on: 11/30/2015 10:57 AM<BR>    Actions taken: Order Entry activity accessed, Diagnosis association updated, Sign clinical note

## 2015-11-30 NOTE — Telephone Encounter (Signed)
Morene Crocker with [facility] called to inform our office of faxed transmission that was sent with daily weights and vitals for patient's appointment with [place] today. Per Meredith Staggers RN with CHF clinic, this transmission was received.  Ave Filter, RN

## 2015-11-30 NOTE — Progress Notes (Signed)
Patient ID: Marquette Saanthony Camba, male   DOB: September 24, 1970, 45 y.o.   MRN: [id number] \  Advanced Heart Failure Clinic Note   Patient ID: Marquette Saanthony Minjares, male   DOB: September 24, 1970, 45 y.o.   MRN: [id number] PCP: Primary HF Cardiologist: Dr Gala RomneyBensimhon  HPI: Marquette Saanthony Wecker is a 45 y.o. male with hx of systolic HF with NICM secondary to prolonged ETOH abuse, EF 5-10% by Echo 11/11/14, and CKD stage 2.  In 8/16, [facility] hemoptysis. Found to have RLL PNA and cardiogenic shock. Treated briefly with milrinone and levophed. Diuresed well. Discharge weight 177   He returns today for follow up. He is doing great.  Previously carvedilol cut back due to low HR (43). Tolerating meds well. Not needing lasix.  Still exercising every day doing push-ups and jogging in place for 1 hour. No CP or SOB. Has gained about 10-15 pounds. No alcohol intake. No need for lasix.    Echo 12/16 EF 35 - 40%No LV clot Echo 11/30/15 EF 35% (reviewed personally)  Echo 10/13/14 EF 20% Echo 11/11/14 EF 5-10%. Severe RV dysfucntion  Labs 11/24/2014: K 4.4 Creatinine 1.05  Labs 11/16/2014 Dig level 0.2  Labs 01/01/15 : K 3.5 Creatinie 1.02   ROS: All systems negative except as listed in HPI, PMH and Problem List.  SH:  Social History   Social History  . Marital status: Single    Spouse name: N/A  . Number of children: 6  . Years of education: N/A   Occupational History  . Unemployed    Social History Main Topics  . Smoking status: Former Smoker    Types: Cigarettes  . Smokeless tobacco: Former NeurosurgeonUser    Types: Chew     Comment: Was a rare smoker  . Alcohol use No  . Drug use: No  . Sexual activity: Yes   Other Topics Concern  . Not on file   Social History Narrative   Lives with mom.      FH:  Family History  Problem Relation Age of Onset  . Hypertension Mother   . Hypertension Father   . Heart attack Father 6351    died  . Hypertension Sister   . Heart attack Paternal Grandfather 50    died     Past Medical History:  Diagnosis Date  . Acute kidney injury (HCC)   . Alcoholism (HCC)    a. Sober since Feb 2016.  Marland Kitchen. Anxiety   . Hypertension   . LV (left ventricular) mural thrombus (HCC) 07/2014   in [place]  . NICM (nonischemic cardiomyopathy) (HCC)    a. 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    Current Outpatient Prescriptions  Medication Sig Dispense Refill  . BIDIL 20-37.5 MG tablet TAKE 1 TABLET BY MOUTH 3 (THREE) TIMES DAILY. 90 tablet 3  . carvedilol (COREG) 3.125 MG tablet TAKE 1 TABLET (3.125 MG TOTAL) BY MOUTH 2 (TWO) TIMES DAILY. 60 tablet 3  . furosemide (LASIX) 20 MG tablet Take 2 tablets (40 mg total) by mouth as needed. 90 tablet 3  . sacubitril-valsartan (ENTRESTO) 97-103 MG Take 1 tablet by mouth 2 (two) times daily. 60 tablet 11  . spironolactone (ALDACTONE) 25 MG tablet TAKE 1 TABLET BY MOUTH ONCE DAILY 34 tablet 2   No current facility-administered medications for this encounter.  Vitals:   11/30/15 1010  BP: 130/78  Pulse: 60  SpO2: 100%  Weight: 195 lb 12 oz (88.8 kg)    PHYSICAL EXAM:  General:  Well appearing. NAD HEENT: normal Neck: supple. JVP flat. Carotids 2+ bilaterally; no bruits. No lymphadenopathy or thryomegaly appreciated. Cor: PMI laterally displaced.  RRR. No rubs, gallops or murmurs appreciated Lungs: CTA Abdomen: soft, NT, ND. No HSM. No bruits or masses. Good bowel sounds. Extremities: no cyanosis, clubbing, rash. No edema Neuro: alert & orientedx3, cranial nerves grossly intact. Moves all 4 extremities w/o difficulty. Affect pleasant.   ASSESSMENT & PLAN: 1. Chronic systolic HF -NICM  Cath ok performed [place] 2016. 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 EF 10% by ECHO 8/16. Severe RV dysfunction. Echo 12/16 EF 35-40% - Echo today reviewed  personally EF ~35% - Continues to do well. NYHA I. Volume status stable.  -Continue lasix prn (hasn't taken in 2 months) - Continue 25 mg spiro daily.  - Continue carvedilol 3.125 mg twice a day. - cut back due to low HR - Continue entresto 97/103 mg twice daily  - Increase bidil 2 tab tid.  - No ICD with NYHA I symptoms and improved EF. - Check labs 2. LV mural thrombus - resolved. Off coumadin 3. H/o ETOH abuse quit 05/25/2014 was drinking 1/5 liquor daily.     Bensimhon, Daniel,MD 10:47 AM

## 2015-12-01 MED FILL — ENTRESTO 97 MG-103 MG TAB: 97-103 | 30 days supply | Qty: 60 | Fill #0

## 2015-12-07 MED FILL — BIDIL TABLET: 20-37.5 | 30 days supply | Qty: 180 | Fill #0

## 2015-12-18 ENCOUNTER — Encounter (HOSPITAL_COMMUNITY): Payer: Self-pay

## 2015-12-18 NOTE — Progress Notes (Signed)
11/30/2015 echo report faxed to [facility] as requested to provided # [phone] Copy of request scanned into patient's electronic medical record.  Ave Filter, RN

## 2015-12-28 ENCOUNTER — Telehealth (HOSPITAL_COMMUNITY): Payer: Self-pay | Admitting: Pharmacist

## 2015-12-28 NOTE — Telephone Encounter (Signed)
Entresto 97-103 mg PA approved by Medicaid through 11/25/16.   Tyler Deis. Bonnye Fava, PharmD, BCPS, CPP Clinical Pharmacist Pager: [phone] Phone: [phone] 12/28/2015 3:10 PM

## 2016-01-02 MED FILL — ENTRESTO 97 MG-103 MG TAB: 97-103 | 30 days supply | Qty: 60 | Fill #1

## 2016-01-02 MED FILL — CARVEDILOL 3.125 MG TABLET: 3.125 | 30 days supply | Qty: 60 | Fill #3

## 2016-01-02 MED FILL — BIDIL TABLET: 20-37.5 | 30 days supply | Qty: 180 | Fill #1

## 2016-01-02 MED FILL — SPIRONOLACTONE 25 MG TABLET: 25 | 34 days supply | Qty: 34 | Fill #1

## 2016-01-17 DIAGNOSIS — Z736 Limitation of activities due to disability: Secondary | ICD-10-CM

## 2016-03-18 ENCOUNTER — Other Ambulatory Visit (HOSPITAL_COMMUNITY): Payer: Self-pay | Admitting: Internal Medicine

## 2016-03-19 ENCOUNTER — Other Ambulatory Visit (HOSPITAL_COMMUNITY): Payer: Self-pay | Admitting: *Deleted

## 2016-03-19 ENCOUNTER — Telehealth (HOSPITAL_COMMUNITY): Payer: Self-pay

## 2016-03-19 MED ORDER — FUROSEMIDE 20 MG PO TABS: 40.0000 mg | ORAL_TABLET | ORAL | 3 refills | Status: DC | PRN

## 2016-03-19 MED FILL — FUROSEMIDE 20 MG TABLET: 20 | 30 days supply | Qty: 60 | Fill #0

## 2016-03-19 NOTE — Telephone Encounter (Signed)
Patient calling CHF clinic to request refill for Lasix. Rx sent electronically to preferred pharmacy [facility] outpatient).  Ave Filter, RN

## 2016-04-16 MED FILL — FUROSEMIDE 20 MG TABLET: 20 | 30 days supply | Qty: 60 | Fill #1

## 2016-04-16 MED FILL — SPIRONOLACTONE 25 MG TABLET: 25 | 34 days supply | Qty: 34 | Fill #2

## 2016-04-22 ENCOUNTER — Telehealth (HOSPITAL_COMMUNITY): Payer: Self-pay | Admitting: Vascular Surgery

## 2016-04-22 NOTE — Telephone Encounter (Signed)
Attempted to call, no answer, no VM available to leave message.  Ave Filter, RN

## 2016-04-22 NOTE — Telephone Encounter (Signed)
PT called he is not feeling well, he has had a cough for 2 weeks and he would like to get appt w/ DB asap.. Please advise

## 2016-04-22 NOTE — Telephone Encounter (Signed)
Attempted to call pt several times, cell number states "number is not in service at this time" and home number there was no answer and no VM.

## 2016-04-29 MED FILL — BIDIL TABLET: 20-37.5 | 30 days supply | Qty: 180 | Fill #2

## 2016-04-29 MED FILL — ENTRESTO 97 MG-103 MG TAB: 97-103 | 30 days supply | Qty: 60 | Fill #2

## 2016-04-30 ENCOUNTER — Other Ambulatory Visit (HOSPITAL_COMMUNITY): Payer: Self-pay | Admitting: *Deleted

## 2016-04-30 MED ORDER — CARVEDILOL 3.125 MG PO TABS: ORAL_TABLET | ORAL | 3 refills | Status: DC

## 2016-05-08 MED FILL — CARVEDILOL 3.125 MG TABLET: 3.125 | 30 days supply | Qty: 60 | Fill #0

## 2016-05-09 ENCOUNTER — Ambulatory Visit: Payer: Medicaid Other | Attending: Family Medicine | Admitting: Family Medicine

## 2016-05-09 ENCOUNTER — Encounter: Payer: Self-pay | Admitting: Family Medicine

## 2016-05-09 VITALS — BP 141/97 | HR 112 | Temp 97.6°F | Ht 69.0 in | Wt 183.8 lb

## 2016-05-09 DIAGNOSIS — Z0001 Encounter for general adult medical examination with abnormal findings: Secondary | ICD-10-CM | POA: Insufficient documentation

## 2016-05-09 DIAGNOSIS — Z87891 Personal history of nicotine dependence: Secondary | ICD-10-CM | POA: Insufficient documentation

## 2016-05-09 DIAGNOSIS — R05 Cough: Secondary | ICD-10-CM | POA: Diagnosis not present

## 2016-05-09 DIAGNOSIS — R053 Chronic cough: Secondary | ICD-10-CM

## 2016-05-09 DIAGNOSIS — Z79899 Other long term (current) drug therapy: Secondary | ICD-10-CM | POA: Diagnosis not present

## 2016-05-09 DIAGNOSIS — I5022 Chronic systolic (congestive) heart failure: Secondary | ICD-10-CM

## 2016-05-09 MED ORDER — BENZONATATE 100 MG PO CAPS
100.0000 mg | ORAL_CAPSULE | Freq: Three times a day (TID) | ORAL | 1 refills | Status: DC | PRN
Start: 2016-05-09 — End: 2016-12-01

## 2016-05-09 MED ORDER — CETIRIZINE HCL 10 MG PO TABS: 10.0000 mg | ORAL_TABLET | Freq: Every day | ORAL | Status: DC

## 2016-05-09 MED ORDER — BENZONATATE 200 MG PO CAPS: 200.0000 mg | ORAL_CAPSULE | Freq: Three times a day (TID) | ORAL | 0 refills | Status: DC | PRN

## 2016-05-09 MED FILL — ALL DAY ALLERGY 10 MG TAB: 10 | 30 days supply | Qty: 30 | Fill #0

## 2016-05-09 MED FILL — BENZONATATE 100 MG CAPSULE: 100 | 10 days supply | Qty: 60 | Fill #0

## 2016-05-09 NOTE — Addendum Note (Signed)
Addended by: Dessa Phi on: 05/09/2016 10:30 AM   Modules accepted: Orders

## 2016-05-09 NOTE — Assessment & Plan Note (Addendum)
Chronic cough in CHF patient Suspect ACE inhibitor induced cough on entresto, will r/o allergy mediated cough, infectious etiology and intrinsic lung disease    Plan: CXR Trial of zyrtec Tessalon perles for cough Referral to pulmonology for lung function testing  Advised against albuterol unless proven intrinsic lung disease given CHF and risk of tachycardia

## 2016-05-09 NOTE — Patient Instructions (Addendum)
Robert Booth was seen today for cough.  Diagnoses and all orders for this visit:  Chronic cough -     Ambulatory referral to Pulmonology -     benzonatate (TESSALON) 200 MG capsule; Take 1 capsule (200 mg total) by mouth 3 (three) times daily as needed for cough. -     cetirizine (ZYRTEC) 10 MG tablet; Take 1 tablet (10 mg total) by mouth at bedtime. -     DG Chest 2 View; Future   You will be called about the pulmonology appointment  F/u in 3-4 weeks for cough   Dr. Armen Pickup

## 2016-05-09 NOTE — Progress Notes (Signed)
Pt is here today for a cough. Pt states that he has had the cough since after christmas.

## 2016-05-09 NOTE — Progress Notes (Signed)
Subjective:  Patient ID: Robert Booth, male    DOB: 09/24/1970  Age: 46 y.o. MRN: [id number]  CC: Cough   HPI Lavance Bratz has HTN and CHF she  presents for   1. Cough: for past 2 months. He takes Entresto for CHF. Has hx of cough reaction to lisinopril. He cough is productive of white sputum. No fever or chills. He has coughing spells. Spells either late at night or early in the morning. He cough is worse in early AM. He denies reflux. He reports cough is associated with SOB and wheezing at time. He was concerned that he had fluid build up from heart failure but this was not the case. Cough with lying down and sitting. He denies history of asthma. He is a former smoker. No known current sick contacts. He reports having cough many years ago that was responsive to albuterol.   Social History  Substance Use Topics  . Smoking status: Former Smoker    Types: Cigarettes  . Smokeless tobacco: Former Neurosurgeon    Types: Chew     Comment: Was a rare smoker  . Alcohol use No    Outpatient Medications Prior to Visit  Medication Sig Dispense Refill  . carvedilol (COREG) 3.125 MG tablet TAKE 1 TABLET (3.125 MG TOTAL) BY MOUTH 2 (TWO) TIMES DAILY. 60 tablet 3  . furosemide (LASIX) 20 MG tablet Take 2 tablets (40 mg total) by mouth as needed. 60 tablet 3  . isosorbide-hydrALAZINE (BIDIL) 20-37.5 MG tablet Take 2 tablets by mouth 3 (three) times daily. 180 tablet 3  . sacubitril-valsartan (ENTRESTO) 97-103 MG Take 1 tablet by mouth 2 (two) times daily. 60 tablet 11  . spironolactone (ALDACTONE) 25 MG tablet TAKE 1 TABLET BY MOUTH ONCE DAILY 34 tablet 2   No facility-administered medications prior to visit.     ROS Review of Systems  Constitutional: Negative for chills, fatigue, fever and unexpected weight change.  Eyes: Negative for visual disturbance.  Respiratory: Positive for cough. Negative for shortness of breath.   Cardiovascular: Negative for chest pain, palpitations and leg swelling.    Gastrointestinal: Negative for abdominal pain, blood in stool, constipation, diarrhea, nausea and vomiting.  Endocrine: Negative for polydipsia, polyphagia and polyuria.  Musculoskeletal: Negative for arthralgias, back pain, gait problem, myalgias and neck pain.  Skin: Negative for rash.  Allergic/Immunologic: Negative for immunocompromised state.  Hematological: Negative for adenopathy. Does not bruise/bleed easily.  Psychiatric/Behavioral: Negative for dysphoric mood, sleep disturbance and suicidal ideas. The patient is not nervous/anxious.     Objective:  BP (!) 141/97 (BP Location: Left Arm, Patient Position: Sitting, Cuff Size: Small)   Pulse (!) 112   Temp 97.6 F (36.4 C) (Oral)   Ht 5\' 9"  (1.753 m)   Wt 183 lb 12.8 oz (83.4 kg)   SpO2 99%   BMI 27.14 kg/m   BP/Weight 05/09/2016 11/30/2015 05/09/2015  Systolic BP 141 130 110  Diastolic BP 97 78 64  Wt. (Lbs) 183.8 195.75 182.5  BMI 27.14 28.91 26.94   Pulse Readings from Last 3 Encounters:  05/09/16 (!) 112  11/30/15 60  05/09/15 78    Physical Exam  Constitutional: He appears well-developed and well-nourished. No distress.  HENT:  Head: Normocephalic and atraumatic.  Nose: Mucosal edema present.  Neck: Normal range of motion. Neck supple.  Cardiovascular: Regular rhythm, normal heart sounds and intact distal pulses.  Tachycardia present.   Pulmonary/Chest: Effort normal and breath sounds normal.  Musculoskeletal: He exhibits no edema.  Neurological: He is alert.  Skin: Skin is warm and dry. No rash noted. No erythema.  Psychiatric: He has a normal mood and affect.    Assessment & Plan:  Shakai was seen today for cough.  Diagnoses and all orders for this visit:  Chronic cough -     Ambulatory referral to Pulmonology -     benzonatate (TESSALON) 200 MG capsule; Take 1 capsule (200 mg total) by mouth 3 (three) times daily as needed for cough. -     cetirizine (ZYRTEC) 10 MG tablet; Take 1 tablet (10 mg total)  by mouth at bedtime. -     DG Chest 2 View; Future   There are no diagnoses linked to this encounter.  No orders of the defined types were placed in this encounter.   Follow-up: No Follow-up on file.   Dessa Phi MD

## 2016-05-10 MED FILL — FUROSEMIDE 20 MG TABLET: 20 | 30 days supply | Qty: 60 | Fill #2

## 2016-05-29 ENCOUNTER — Encounter (HOSPITAL_COMMUNITY): Payer: No Typology Code available for payment source | Admitting: Internal Medicine

## 2016-06-20 ENCOUNTER — Other Ambulatory Visit (HOSPITAL_COMMUNITY): Payer: Self-pay | Admitting: Internal Medicine

## 2016-06-20 MED FILL — FUROSEMIDE 20 MG TABLET: 20 | 30 days supply | Qty: 60 | Fill #3

## 2016-07-08 MED FILL — SPIRONOLACTONE 25 MG TABLET: 25 | 34 days supply | Qty: 34 | Fill #0

## 2016-07-09 ENCOUNTER — Other Ambulatory Visit (HOSPITAL_COMMUNITY): Payer: Self-pay | Admitting: Internal Medicine

## 2016-08-14 ENCOUNTER — Encounter: Payer: Self-pay | Admitting: Family Medicine

## 2016-10-29 MED FILL — FUROSEMIDE 20 MG TABLET: 20 | 30 days supply | Qty: 60 | Fill #0

## 2016-10-29 MED FILL — SPIRONOLACTONE 25 MG TABLET: 25 | 34 days supply | Qty: 34 | Fill #1

## 2016-12-01 ENCOUNTER — Encounter (HOSPITAL_COMMUNITY): Payer: Self-pay

## 2016-12-01 ENCOUNTER — Inpatient Hospital Stay (HOSPITAL_COMMUNITY)
Admission: EM | Admit: 2016-12-01 | Discharge: 2016-12-05 | DRG: 291 | Disposition: A | Discharge status: Home / Self Care | Payer: Medicaid Other | Attending: Internal Medicine | Admitting: Internal Medicine

## 2016-12-01 ENCOUNTER — Emergency Department (HOSPITAL_COMMUNITY): Payer: Medicaid Other

## 2016-12-01 DIAGNOSIS — F1021 Alcohol dependence, in remission: Secondary | ICD-10-CM | POA: Diagnosis present

## 2016-12-01 DIAGNOSIS — R778 Other specified abnormalities of plasma proteins: Secondary | ICD-10-CM | POA: Diagnosis present

## 2016-12-01 DIAGNOSIS — N183 Chronic kidney disease, stage 3 (moderate): Secondary | ICD-10-CM | POA: Diagnosis present

## 2016-12-01 DIAGNOSIS — I509 Heart failure, unspecified: Secondary | ICD-10-CM

## 2016-12-01 DIAGNOSIS — Z888 Allergy status to other drugs, medicaments and biological substances status: Secondary | ICD-10-CM

## 2016-12-01 DIAGNOSIS — I513 Intracardiac thrombosis, not elsewhere classified: Secondary | ICD-10-CM | POA: Diagnosis present

## 2016-12-01 DIAGNOSIS — I10 Essential (primary) hypertension: Secondary | ICD-10-CM | POA: Diagnosis present

## 2016-12-01 DIAGNOSIS — E876 Hypokalemia: Secondary | ICD-10-CM | POA: Diagnosis present

## 2016-12-01 DIAGNOSIS — I130 Hypertensive heart and chronic kidney disease with heart failure and stage 1 through stage 4 chronic kidney disease, or unspecified chronic kidney disease: Secondary | ICD-10-CM | POA: Diagnosis not present

## 2016-12-01 DIAGNOSIS — Z8249 Family history of ischemic heart disease and other diseases of the circulatory system: Secondary | ICD-10-CM

## 2016-12-01 DIAGNOSIS — I5043 Acute on chronic combined systolic (congestive) and diastolic (congestive) heart failure: Secondary | ICD-10-CM | POA: Diagnosis present

## 2016-12-01 DIAGNOSIS — I428 Other cardiomyopathies: Secondary | ICD-10-CM

## 2016-12-01 DIAGNOSIS — Z91128 Patient's intentional underdosing of medication regimen for other reason: Secondary | ICD-10-CM | POA: Diagnosis not present

## 2016-12-01 DIAGNOSIS — Z79899 Other long term (current) drug therapy: Secondary | ICD-10-CM

## 2016-12-01 DIAGNOSIS — I429 Cardiomyopathy, unspecified: Secondary | ICD-10-CM | POA: Diagnosis present

## 2016-12-01 DIAGNOSIS — I248 Other forms of acute ischemic heart disease: Secondary | ICD-10-CM | POA: Diagnosis present

## 2016-12-01 DIAGNOSIS — Z9889 Other specified postprocedural states: Secondary | ICD-10-CM

## 2016-12-01 DIAGNOSIS — Z87891 Personal history of nicotine dependence: Secondary | ICD-10-CM

## 2016-12-01 DIAGNOSIS — F1020 Alcohol dependence, uncomplicated: Secondary | ICD-10-CM | POA: Diagnosis present

## 2016-12-01 DIAGNOSIS — N179 Acute kidney failure, unspecified: Secondary | ICD-10-CM | POA: Diagnosis present

## 2016-12-01 DIAGNOSIS — Z9114 Patient's other noncompliance with medication regimen: Secondary | ICD-10-CM

## 2016-12-01 DIAGNOSIS — R7989 Other specified abnormal findings of blood chemistry: Secondary | ICD-10-CM

## 2016-12-01 HISTORY — DX: Alcohol abuse, in remission: F10.11

## 2016-12-01 HISTORY — DX: Essential (primary) hypertension: I10

## 2016-12-01 HISTORY — DX: Heart failure, unspecified: I50.9

## 2016-12-01 LAB — BASIC METABOLIC PANEL
Anion gap: 8 (ref 5–15)
BUN: 18 mg/dL (ref 6–20)
CO2: 25 mmol/L (ref 22–32)
Calcium: 8.9 mg/dL (ref 8.9–10.3)
Chloride: 105 mmol/L (ref 101–111)
Creatinine, Ser: 1.4 mg/dL — ABNORMAL HIGH (ref 0.61–1.24)
GFR calc Af Amer: >60 mL/min (ref >60)
GFR, EST NON AFRICAN AMERICAN: 59 mL/min — AB (ref >60)
Glucose, Bld: 97 mg/dL (ref 65–99)
Potassium: 3.5 mmol/L (ref 3.5–5.1)
SODIUM: 138 mmol/L (ref 135–145)

## 2016-12-01 LAB — CBC
HEMATOCRIT: 39 % (ref 39.0–52.0)
Hemoglobin: 13.8 g/dL (ref 13.0–17.0)
MCH: 30.1 pg (ref 26.0–34.0)
MCHC: 35.4 g/dL (ref 30.0–36.0)
MCV: 85.2 fL (ref 78.0–100.0)
PLATELETS: 270 10*3/uL (ref 150–400)
RBC: 4.58 MIL/uL (ref 4.22–5.81)
RDW: 13.2 % (ref 11.5–15.5)
WBC: 12 10*3/uL — AB (ref 4.0–10.5)

## 2016-12-01 LAB — RAPID URINE DRUG SCREEN, HOSP PERFORMED
AMPHETAMINES: NOT DETECTED
Barbiturates: NOT DETECTED
Benzodiazepines: NOT DETECTED
Cocaine: NOT DETECTED
Opiates: NOT DETECTED
Tetrahydrocannabinol: NOT DETECTED

## 2016-12-01 LAB — BRAIN NATRIURETIC PEPTIDE: B Natriuretic Peptide: 2326.1 pg/mL — ABNORMAL HIGH (ref 0.0–100.0)

## 2016-12-01 LAB — I-STAT TROPONIN, ED: Troponin i, poc: 0.42 ng/mL (ref 0.00–0.08)

## 2016-12-01 LAB — TROPONIN I: Troponin I: 0.5 ng/mL (ref <0.03)

## 2016-12-01 LAB — ETHANOL

## 2016-12-01 MED ORDER — MUPIROCIN 2 % EX OINT
1.0000 "application " | TOPICAL_OINTMENT | Freq: Two times a day (BID) | CUTANEOUS | Status: DC
  Administered 2016-12-01 – 2016-12-05 (×7): 1 via NASAL
  Filled 2016-12-01 (×2): qty 22

## 2016-12-01 MED ORDER — ISOSORB DINITRATE-HYDRALAZINE 20-37.5 MG PO TABS
2.0000 | ORAL_TABLET | Freq: Three times a day (TID) | ORAL | Status: DC
  Administered 2016-12-01 – 2016-12-05 (×11): 2 via ORAL
  Filled 2016-12-01 (×12): qty 2

## 2016-12-01 MED ORDER — FUROSEMIDE 10 MG/ML IJ SOLN
60.0000 mg | Freq: Once | INTRAMUSCULAR | Status: AC
  Administered 2016-12-01: 60 mg via INTRAVENOUS
  Filled 2016-12-01: qty 6

## 2016-12-01 MED ORDER — ONDANSETRON HCL 4 MG/2ML IJ SOLN
4.0000 mg | Freq: Once | INTRAMUSCULAR | Status: AC
  Administered 2016-12-01: 4 mg via INTRAVENOUS
  Filled 2016-12-01: qty 2

## 2016-12-01 MED ORDER — ACETAMINOPHEN 325 MG PO TABS
650.0000 mg | ORAL_TABLET | ORAL | Status: DC | PRN
  Administered 2016-12-01 – 2016-12-04 (×7): 650 mg via ORAL
  Filled 2016-12-01 (×7): qty 2

## 2016-12-01 MED ORDER — CHLORHEXIDINE GLUCONATE CLOTH 2 % EX PADS
6.0000 | MEDICATED_PAD | Freq: Every day | CUTANEOUS | Status: DC
  Administered 2016-12-03 – 2016-12-05 (×3): 6 via TOPICAL

## 2016-12-01 MED ORDER — THIAMINE HCL 100 MG/ML IJ SOLN
100.0000 mg | Freq: Every day | INTRAMUSCULAR | Status: DC
  Filled 2016-12-01: qty 2

## 2016-12-01 MED ORDER — SODIUM CHLORIDE 0.9% FLUSH
3.0000 mL | Freq: Two times a day (BID) | INTRAVENOUS | Status: DC
  Administered 2016-12-01 – 2016-12-05 (×7): 3 mL via INTRAVENOUS

## 2016-12-01 MED ORDER — ONDANSETRON HCL 4 MG/2ML IJ SOLN: 4.0000 mg | Freq: Four times a day (QID) | INTRAMUSCULAR | Status: DC | PRN

## 2016-12-01 MED ORDER — CARVEDILOL 3.125 MG PO TABS
3.1250 mg | ORAL_TABLET | Freq: Two times a day (BID) | ORAL | Status: DC
  Administered 2016-12-01 – 2016-12-04 (×6): 3.125 mg via ORAL
  Filled 2016-12-01 (×6): qty 1

## 2016-12-01 MED ORDER — SODIUM CHLORIDE 0.9% FLUSH: 3.0000 mL | INTRAVENOUS | Status: DC | PRN

## 2016-12-01 MED ORDER — VITAMIN B-1 100 MG PO TABS
100.0000 mg | ORAL_TABLET | Freq: Every day | ORAL | Status: DC
  Administered 2016-12-01 – 2016-12-05 (×5): 100 mg via ORAL
  Filled 2016-12-01 (×5): qty 1

## 2016-12-01 MED ORDER — ADULT MULTIVITAMIN W/MINERALS CH
1.0000 | ORAL_TABLET | Freq: Every day | ORAL | Status: DC
  Administered 2016-12-01 – 2016-12-05 (×5): 1 via ORAL
  Filled 2016-12-01 (×5): qty 1

## 2016-12-01 MED ORDER — LORAZEPAM 1 MG PO TABS: 1.0000 mg | ORAL_TABLET | Freq: Four times a day (QID) | ORAL | Status: AC | PRN

## 2016-12-01 MED ORDER — LORAZEPAM 2 MG/ML IJ SOLN: 1.0000 mg | Freq: Four times a day (QID) | INTRAMUSCULAR | Status: AC | PRN

## 2016-12-01 MED ORDER — SACUBITRIL-VALSARTAN 97-103 MG PO TABS
1.0000 | ORAL_TABLET | Freq: Two times a day (BID) | ORAL | Status: DC
  Administered 2016-12-01: 1 via ORAL
  Filled 2016-12-01 (×2): qty 1

## 2016-12-01 MED ORDER — ENOXAPARIN SODIUM 40 MG/0.4ML ~~LOC~~ SOLN
40.0000 mg | SUBCUTANEOUS | Status: DC
  Administered 2016-12-01: 40 mg via SUBCUTANEOUS
  Filled 2016-12-01: qty 0.4

## 2016-12-01 MED ORDER — SPIRONOLACTONE 25 MG PO TABS
25.0000 mg | ORAL_TABLET | Freq: Every day | ORAL | Status: DC
  Administered 2016-12-02: 25 mg via ORAL
  Filled 2016-12-01 (×2): qty 1

## 2016-12-01 MED ORDER — FOLIC ACID 1 MG PO TABS
1.0000 mg | ORAL_TABLET | Freq: Every day | ORAL | Status: DC
  Administered 2016-12-01 – 2016-12-05 (×5): 1 mg via ORAL
  Filled 2016-12-01 (×5): qty 1

## 2016-12-01 MED ORDER — FUROSEMIDE 10 MG/ML IJ SOLN
60.0000 mg | Freq: Two times a day (BID) | INTRAMUSCULAR | Status: DC
  Administered 2016-12-01 – 2016-12-03 (×4): 60 mg via INTRAVENOUS
  Filled 2016-12-01 (×5): qty 6

## 2016-12-01 MED ORDER — SODIUM CHLORIDE 0.9 % IV SOLN: 250.0000 mL | INTRAVENOUS | Status: DC | PRN

## 2016-12-01 NOTE — H&P (Signed)
History and Physical    [name]:[id number] DOB: [birth year] DOA: 12/01/2016   PCP: Boykin Nearing, MD   Attending physician: Aggie Moats  Patient coming from/Resides with: Private residence  Chief Complaint: Dyspnea on exertion and orthopnea  HPI: Robert Booth is a 46 y.o. male with medical history significant for prior alcoholism, nonischemic cardiomyopathy with chronic systolic heart failure, hypertension and history of LV mural thrombus. Patient reports that in the past few months he recently discovered he was no longer Medicaid eligible and has repeatedly attempted to obtain SSI disability and has been denied. Because of this he has been unable to obtain his prescriptions. During the past 3-4 months he has attempted to reestablish his Medicaid but has been unsuccessful. Because of this he is only been able to afford his Lasix and Aldactone and therefore has not taken his other cardiac medications as prescribed. He has previously been established with the heart failure clinic but due to lack of insurance he has been unable to follow up. He was last evaluated by healthcare provider at the [facility] in February of this year. Upon presentation to the ER patient was complaining of dyspnea on exertion, orthopnea and increased abdominal swelling without lower extremity swelling. He had an elevated BNP of 2326 and elevated poc troponin of 0.42. Chest x-ray consistent with pulmonary venous hypertension without overt edema. No documentation as to whether patient was hypoxemic at presentation. Patient will be admitted for IV Lasix regarding suspected acute heart failure exacerbation in setting of nonadherence to medications due to inability to procure.  ED Course:  Vital Signs: BP (!) 150/113   Pulse (!) 114   Temp (!) 97.4 F (36.3 C) (Oral)   Resp (!) 21   SpO2 99%  2 view CXR: As above Lab data: Sodium 138, potassium 3.5, chloride 105, CO2 25, glucose 97,  BUN 19, creatinine 1.4, anion gap 8, BNP 2326, poc troponin 0.42, white count 12,000 differential not obtained, hemoglobin 13.8, platelets 270,000 Medications and treatments: Zofran 4 mg IV 1, Lasix 60 mg IV 1  Review of Systems:  In addition to the HPI above,  No Fever-chills, myalgias or other constitutional symptoms No Headache, changes with Vision or hearing, new weakness, tingling, numbness in any extremity, dizziness, dysarthria or word finding difficulty, gait disturbance or imbalance, tremors or seizure activity No problems swallowing food or Liquids, indigestion/reflux, choking or coughing while eating, abdominal pain with or after eating No Chest pain, Cough, palpitations No Abdominal pain, N/V, melena,hematochezia, dark tarry stools, constipation No dysuria, malodorous urine, hematuria or flank pain No new skin rashes, lesions, masses or bruises, No new joint pains, aches, swelling or redness No recent unintentional weight gain or loss No polyuria, polydypsia or polyphagia   Past Medical History:  Diagnosis Date  . Acute kidney injury ([place])   . Alcoholism ([place])    a. Sober since Feb 2016.  Marland Kitchen Anxiety   . CHF (congestive heart failure) ([place])   . Hypertension   . LV (left ventricular) mural thrombus 07/2014   in [place]  . NICM (nonischemic cardiomyopathy) ([place])    a. diagnosed with systolic CHF in 10/1189 with EF 20% with a negative cath in 05/2014 per records from [place], with etiology of NICM possibly due to combination of alcoholic cardiomyopathy with superimposed viral myocarditis.    Past Surgical History:  Procedure Laterality Date  . CARDIAC CATHETERIZATION  05/2014   In [place], clean cors  . HERNIA REPAIR Right Inguinal  .  KNEE SURGERY Right 1998    Social History   Social History  . Marital status: Single    Spouse name: N/A  . Number of children: 6  . Years of education: N/A   Occupational History  . Unemployed    Social History Main Topics    . Smoking status: Former Smoker    Types: Cigarettes  . Smokeless tobacco: Former Systems developer    Types: Chew     Comment: Was a rare smoker  . Alcohol use No  . Drug use: No  . Sexual activity: Yes   Other Topics Concern  . Not on file   Social History Narrative   Lives with mom.      Mobility: Independent Work history: Unemployed-attempting to obtain disability secondary to chronic systolic heart failure   Allergies  Allergen Reactions  . Celexa [Citalopram Hydrobromide] Other (See Comments)    DISORIENTED  . Lisinopril Cough    Family History  Problem Relation Age of Onset  . Hypertension Mother   . Hypertension Father   . Heart attack Father 50       died  . Hypertension Sister   . Heart attack Paternal Grandfather 63       died     Prior to Admission medications   Medication Sig Start Date End Date Taking? Authorizing Provider  benzonatate (TESSALON) 100 MG capsule Take 1-2 capsules (100-200 mg total) by mouth 3 (three) times daily as needed for cough. 05/09/16   [facility], Adriana Mccallum, MD  carvedilol (COREG) 3.125 MG tablet TAKE 1 TABLET (3.125 MG TOTAL) BY MOUTH 2 (TWO) TIMES DAILY. 04/30/16   Bensimhon, Shaune Pascal, MD  cetirizine (ZYRTEC) 10 MG tablet Take 1 tablet (10 mg total) by mouth at bedtime. 05/09/16   [facility], Adriana Mccallum, MD  furosemide (LASIX) 20 MG tablet TAKE 2 TABLETS BY MOUTH AS NEEDED 07/09/16   [facility], Jeneen Rinks, MD  isosorbide-hydrALAZINE (BIDIL) 20-37.5 MG tablet Take 2 tablets by mouth 3 (three) times daily. 11/30/15   Bensimhon, Shaune Pascal, MD  sacubitril-valsartan (ENTRESTO) 97-103 MG Take 1 tablet by mouth 2 (two) times daily. 11/30/15   Bensimhon, Shaune Pascal, MD  spironolactone (ALDACTONE) 25 MG tablet TAKE 1 TABLET BY MOUTH ONCE DAILY 06/21/16   Bensimhon, Shaune Pascal, MD    Physical Exam: Vitals:   12/01/16 1500 12/01/16 1532 12/01/16 1533 12/01/16 1615  BP: (!) 145/117 (!) 159/123  (!) 150/113  Pulse: (!) 56 (!) 56  (!) 114  Resp: (!) 23  16 (!) 21  Temp:       TempSrc:      SpO2: 100% 99%  99%      Constitutional: NAD, calm, comfortable Eyes: PERRL, lids and conjunctivae normal ENMT: Mucous membranes are moist. Posterior pharynx clear of any exudate or lesions. Normal dentition.  Neck: normal, supple, no masses, no thyromegaly Respiratory: Fine expiratory crackles primarily in the bases, Normal respiratory effort. No accessory muscle use. RA at rest Cardiovascular: Regular rate and rhythm, no murmurs / rubs / gallops. No extremity edema. 2+ pedal pulses. No carotid bruits.  Abdomen: no tenderness, no masses palpated. No hepatosplenomegaly. Bowel sounds positive. Slightly distended Musculoskeletal: no clubbing / cyanosis. No joint deformity upper and lower extremities. Good ROM, no contractures. Normal muscle tone.  Skin: no rashes, lesions, ulcers. No induration Neurologic: CN 2-12 grossly intact. Sensation intact, DTR normal. Strength 5/5 x all 4 extremities.  Psychiatric: Normal judgment and insight. Alert and oriented x 3. Normal mood.    Labs on Admission:  I have personally reviewed following labs and imaging studies  CBC:  Recent Labs Lab 12/01/16 1347  WBC 12.0*  HGB 13.8  HCT 39.0  MCV 85.2  PLT 629   Basic Metabolic Panel:  Recent Labs Lab 12/01/16 1347  NA 138  K 3.5  CL 105  CO2 25  GLUCOSE 97  BUN 18  CREATININE 1.40*  CALCIUM 8.9   GFR: CrCl cannot be calculated (Unknown ideal weight.). Liver Function Tests: No results for input(s): AST, ALT, ALKPHOS, BILITOT, PROT, ALBUMIN in the last 168 hours. No results for input(s): LIPASE, AMYLASE in the last 168 hours. No results for input(s): AMMONIA in the last 168 hours. Coagulation Profile: No results for input(s): INR, PROTIME in the last 168 hours. Cardiac Enzymes: No results for input(s): CKTOTAL, CKMB, CKMBINDEX, TROPONINI in the last 168 hours. BNP (last 3 results) No results for input(s): PROBNP in the last 8760 hours. HbA1C: No results for input(s):  HGBA1C in the last 72 hours. CBG: No results for input(s): GLUCAP in the last 168 hours. Lipid Profile: No results for input(s): CHOL, HDL, LDLCALC, TRIG, CHOLHDL, LDLDIRECT in the last 72 hours. Thyroid Function Tests: No results for input(s): TSH, T4TOTAL, FREET4, T3FREE, THYROIDAB in the last 72 hours. Anemia Panel: No results for input(s): VITAMINB12, FOLATE, FERRITIN, TIBC, IRON, RETICCTPCT in the last 72 hours. Urine analysis:    Component Value Date/Time   COLORURINE AMBER (A) 11/11/2014 1302   APPEARANCEUR HAZY (A) 11/11/2014 1302   LABSPEC 1.025 11/11/2014 1302   PHURINE 5.5 11/11/2014 1302   GLUCOSEU NEGATIVE 11/11/2014 1302   HGBUR SMALL (A) 11/11/2014 1302   BILIRUBINUR SMALL (A) 11/11/2014 1302   KETONESUR 15 (A) 11/11/2014 1302   PROTEINUR 100 (A) 11/11/2014 1302   UROBILINOGEN 0.2 11/11/2014 1302   NITRITE NEGATIVE 11/11/2014 1302   LEUKOCYTESUR NEGATIVE 11/11/2014 1302   Sepsis Labs: @LABRCNTIP (procalcitonin:4,lacticidven:4) )No results found for this or any previous visit (from the past 240 hour(s)).   Radiological Exams on Admission: Dg Chest 2 View  Result Date: 12/01/2016 CLINICAL DATA:  Pt reports tightness across his abdomen, dry cough, and SOB x 2 days but worse today; pt reports hx of HTN and CHF. He reports he's been out of his HTN meds. Former smoker EXAM: CHEST  2 VIEW COMPARISON:  11/15/2014 FINDINGS: The rim density in the right mid lung has intervally cleared. A round density at the right lung base is almost nearly cleared, although there is some bandlike residual density in this vicinity which could be from scarring or atelectasis. Nevertheless, the significant improvement strongly argues against a neoplastic etiology. Mild enlargement of the cardiopericardial silhouette, with prominence of upper zone pulmonary vasculature but without overt edema. The right IJ line is no longer present. No pleural effusion. IMPRESSION: 1. Continued improvement, with  resolution of the prior rounded density in the right mid lung, and prominent improvement of the rounded density at the right lung base. At the right lung base there is some residual bandlike density in volume loss probably from scarring or atelectasis. 2. Mild enlargement of the cardiopericardial silhouette, with cephalization of blood flow suggesting pulmonary venous hypertension, but no overt edema. Electronically Signed   By: Van Clines M.D.   On: 12/01/2016 14:06    EKG: (Independently reviewed) sinus tachycardia with ventricular rate 110 bpm, QTC 479 ms, voltage criteria met for LVH, no acute ischemic changes, left axis deviation without left bundle-branch block  Assessment/Plan Principal Problem:   Acute on chronic combined systolic  and diastolic heart failure, NYHA class 3/ NICM (nonischemic cardiomyopathy)  -Patient presents to the hospital with dyspnea on exertion, orthopnea and abdominal swelling after inability to take medications as prescribed for at least 3-4 months with clinical findings of abnormal chest x-ray and elevated BNP and troponin -He has been taking Aldactone and Lasix but has not been taking his Entresto and Bidil; will resume this admission -Lasix 60 mg IV every 12 hours -Daily weights, strict I/O -Initial echocardiogram and diagnosis with an EF of 5-10% -Most recent ECHO August 2017: EF 35-40% with grade 2 diastolic dysfunction without significant valvular abnormalities -CM consulted to assist with discharge medications/help with obtaining Medicaid in outpatient setting -Room air ambulatory pulse oximetry to determine if hypoxemic with exertion  Active Problems:   Uncontrolled hypertension -Patient presents with uncontrolled blood pressure and setting of inability to obtain home medications -Likely contributing factor in heart failure exacerbation -Anticipate with resumption of medications and treatment of heart failure blood pressure one along    Alcoholism   -Patient reports has not utilized alcohol in greater then 1 year -EtOH level and UDS    Elevated troponin -Likely secondary to demand ischemia from uncontrolled blood pressure and acute heart failure exacerbation -Cycle troponin -Denies chest pain and EKG unremarkable      DVT prophylaxis: Lovenox  Code Status: Full  Family Communication: No family at bedside Disposition Plan: Home Consults called: None     Zalma Channing L. ANP-BC [facility] Pager [phone]   If 7PM-7AM, please contact night-coverage [email or web address] Password [email or web address]  12/01/2016, 4:19 PM

## 2016-12-01 NOTE — ED Provider Notes (Signed)
[facility]PT Provider Note   [name]: [id number] Arrival date & time: 12/01/16  1329     History   Chief Complaint Chief Complaint  Patient presents with  . Shortness of Breath    HPI Robert Booth is a 46 y.o. male.  HPI   46 year old male with a past medical history of systolic heart failure with an ICM secondary to prolonged alcohol abuse, EF 5-10% by echo on 11/11/2014, CKD stage II presents today with complaints of shortness of breath.  Patient notes that over the last week he has had several episodes of feeling slightly short of breath that have improved.  He notes her last several days the shortness of breath has stayed, he is noted abdominal distention and swelling, and reports this feels similar to his previous episodes of heart failure.  Patient notes shortness of breath is at baseline, worse with ambulation or lying flat.  Patient notes that he does not regularly need Lasix, but took 2 doses of 20 mg tabs today as they were prescribed as needed.  Patient denies any swelling in his lower extremities, he denies any significant cough, fever.  Patient reports he slightly nauseous, no episodes of vomiting.  Patient denies any preceding illnesses.  Patient denies any drug or alcohol use.  Patient reports that over the last 4 months he has been unable to afford his cardiac medication.  He notes the only 2 he has been able to take are Lasix and spironolactone  His recent echo performed by cardiology on 11/30/2015 showed EF of 35%   Past Medical History:  Diagnosis Date  . Acute kidney injury ([facility])   . Alcoholism ([facility])    a. Sober since Feb 2016.  Marland Kitchen Anxiety   . CHF (congestive heart failure) ([facility])   . Hypertension   . LV (left ventricular) mural thrombus 07/2014   in [place]  . NICM (nonischemic cardiomyopathy) ([facility])    a. diagnosed with systolic CHF in 05/2014 with EF 20% with a negative cath in 05/2014 per records from [place], with etiology of NICM possibly due to combination  of alcoholic cardiomyopathy with superimposed viral myocarditis.    Patient Active Problem List   Diagnosis Date Noted  . Acute on chronic combined systolic and diastolic CHF, NYHA class 3 (HCC) 12/01/2016  . Alcoholism ([facility]) 12/01/2016  . Uncontrolled hypertension 12/01/2016  . NICM (nonischemic cardiomyopathy) ([facility]) 12/01/2016  . Elevated troponin 12/01/2016  . Chronic cough 05/09/2016  . H/O ETOH abuse 12/14/2014  . Chronic systolic heart failure (HCC) 11/24/2014  . Elevated LFTs   . AKI (acute kidney injury) ([facility]CC) 11/09/2014  . Subtherapeutic international normalized ratio (INR)   . Cardiac failure (HCC)   . Positive D dimer   . Anticoagulated on Coumadin 11/08/2014  . Visual disturbance 11/08/2014  . Encounter for therapeutic drug monitoring 10/21/2014  . Congestive dilated cardiomyopathy (HCC)   . History of alcohol abuse   . LV (left ventricular) mural thrombus   . Nonischemic cardiomyopathy (HCC) 08/18/2014  . HTN (hypertension) 08/18/2014    Past Surgical History:  Procedure Laterality Date  . CARDIAC CATHETERIZATION  05/2014   In [place], clean cors  . HERNIA REPAIR Right Inguinal  . KNEE SURGERY Right 1998       Home Medications    Prior to Admission medications   Medication Sig Start Date End Date Taking? Authorizing Provider  furosemide (LASIX) 20 MG tablet TAKE 2 TABLETS BY MOUTH AS NEEDED Patient taking differently: TAKE 20 mg TABLETS BY  MOUTH AS NEEDED 07/09/16  Yes [facility], Fayrene Fearing, MD  spironolactone (ALDACTONE) 25 MG tablet TAKE 1 TABLET BY MOUTH ONCE DAILY Patient taking differently: TAKE 25 mg TABLET BY MOUTH ONCE DAILY 06/21/16  Yes Bensimhon, Bevelyn Buckles, MD  carvedilol (COREG) 3.125 MG tablet TAKE 1 TABLET (3.125 MG TOTAL) BY MOUTH 2 (TWO) TIMES DAILY. Patient not taking: Reported on 12/01/2016 04/30/16   Bensimhon, Bevelyn Buckles, MD  isosorbide-hydrALAZINE (BIDIL) 20-37.5 MG tablet Take 2 tablets by mouth 3 (three) times daily. Patient not taking: Reported  on 12/01/2016 11/30/15   Bensimhon, Bevelyn Buckles, MD  sacubitril-valsartan (ENTRESTO) 97-103 MG Take 1 tablet by mouth 2 (two) times daily. Patient not taking: Reported on 12/01/2016 11/30/15   Bensimhon, Bevelyn Buckles, MD    Family History Family History  Problem Relation Age of Onset  . Hypertension Mother   . Hypertension Father   . Heart attack Father 82       died  . Hypertension Sister   . Heart attack Paternal Grandfather 60       died    Social History Social History  Substance Use Topics  . Smoking status: Former Smoker    Types: Cigarettes  . Smokeless tobacco: Former Neurosurgeon    Types: Chew     Comment: Was a rare smoker  . Alcohol use No     Allergies   Celexa [citalopram hydrobromide] and Lisinopril   Review of Systems Review of Systems  All other systems reviewed and are negative.  Physical Exam Updated Vital Signs BP (!) 147/107 (BP Location: Right Arm)   Pulse (!) 108   Temp (!) 97.5 F (36.4 C) (Oral)   Resp 18   Ht 5\' 9"  (1.753 m)   Wt 83.4 kg (183 lb 12.8 oz)   SpO2 98%   BMI 27.14 kg/m   Physical Exam  Constitutional: He is oriented to person, place, and time. He appears well-developed and well-nourished.  HENT:  Head: Normocephalic and atraumatic.  Eyes: Pupils are equal, round, and reactive to light. Conjunctivae are normal. Right eye exhibits no discharge. Left eye exhibits no discharge. No scleral icterus.  Neck: Normal range of motion. No JVD present. No tracheal deviation present.  Cardiovascular: Normal rate and regular rhythm.  Exam reveals no gallop and no friction rub.   Murmur heard. Systolic murmur   Pulmonary/Chest: Effort normal and breath sounds normal. No stridor. No respiratory distress. He has no wheezes. He has no rales. He exhibits no tenderness.  Abdominal: Soft. He exhibits distension. He exhibits no mass. There is no tenderness. There is no rebound and no guarding. No hernia.  Musculoskeletal: He exhibits no edema.  Neurological:  He is alert and oriented to person, place, and time. Coordination normal.  Skin: Skin is warm.  Psychiatric: He has a normal mood and affect. His behavior is normal. Judgment and thought content normal.  Nursing note and vitals reviewed.   ED Treatments / Results  Labs (all labs ordered are listed, but only abnormal results are displayed) Labs Reviewed  MRSA PCR SCREENING - Abnormal; Notable for the following:       Result Value   MRSA by PCR POSITIVE (*)    All other components within normal limits  BASIC METABOLIC PANEL - Abnormal; Notable for the following:    Creatinine, Ser 1.40 (*)    GFR calc non Af Amer 59 (*)    All other components within normal limits  CBC - Abnormal; Notable for the following:  WBC 12.0 (*)    All other components within normal limits  BRAIN NATRIURETIC PEPTIDE - Abnormal; Notable for the following:    B Natriuretic Peptide 2,326.1 (*)    All other components within normal limits  TROPONIN I - Abnormal; Notable for the following:    Troponin I 0.50 (*)    All other components within normal limits  I-STAT TROPONIN, ED - Abnormal; Notable for the following:    Troponin i, poc 0.42 (*)    All other components within normal limits  RAPID URINE DRUG SCREEN, HOSP PERFORMED  ETHANOL  TROPONIN I  TROPONIN I  HIV ANTIBODY (ROUTINE TESTING)  BASIC METABOLIC PANEL    EKG  EKG Interpretation  Date/Time:  Sunday December 01 2016 13:36:23 EDT Ventricular Rate:  109 PR Interval:  144 QRS Duration: 94 QT Interval:  368 QTC Calculation: 495 R Axis:   -33 Text Interpretation:  Sinus tachycardia with occasional Premature ventricular complexes Biatrial enlargement Left axis deviation Nonspecific T wave abnormality Abnormal ECG TW abnormality aVL new from prior, since last EKG, rate has increased Confirmed by Alvira Monday ([id number]) on 12/01/2016 3:25:38 PM       Radiology Dg Chest 2 View  Result Date: 12/01/2016 CLINICAL DATA:  Pt reports tightness  across his abdomen, dry cough, and SOB x 2 days but worse today; pt reports hx of HTN and CHF. He reports he's been out of his HTN meds. Former smoker EXAM: CHEST  2 VIEW COMPARISON:  11/15/2014 FINDINGS: The rim density in the right mid lung has intervally cleared. A round density at the right lung base is almost nearly cleared, although there is some bandlike residual density in this vicinity which could be from scarring or atelectasis. Nevertheless, the significant improvement strongly argues against a neoplastic etiology. Mild enlargement of the cardiopericardial silhouette, with prominence of upper zone pulmonary vasculature but without overt edema. The right IJ line is no longer present. No pleural effusion. IMPRESSION: 1. Continued improvement, with resolution of the prior rounded density in the right mid lung, and prominent improvement of the rounded density at the right lung base. At the right lung base there is some residual bandlike density in volume loss probably from scarring or atelectasis. 2. Mild enlargement of the cardiopericardial silhouette, with cephalization of blood flow suggesting pulmonary venous hypertension, but no overt edema. Electronically Signed   By: Gaylyn Rong M.D.   On: 12/01/2016 14:06    Procedures Procedures (including critical care time)  Medications Ordered in ED Medications  spironolactone (ALDACTONE) tablet 25 mg (not administered)  carvedilol (COREG) tablet 3.125 mg (not administered)  isosorbide-hydrALAZINE (BIDIL) 20-37.5 MG per tablet 2 tablet (not administered)  sacubitril-valsartan (ENTRESTO) 97-103 mg per tablet (not administered)  sodium chloride flush (NS) 0.9 % injection 3 mL (not administered)  sodium chloride flush (NS) 0.9 % injection 3 mL (not administered)  0.9 %  sodium chloride infusion (not administered)  acetaminophen (TYLENOL) tablet 650 mg (650 mg Oral Given 12/01/16 1850)  ondansetron (ZOFRAN) injection 4 mg (not administered)    enoxaparin (LOVENOX) injection 40 mg (not administered)  furosemide (LASIX) injection 60 mg (not administered)  LORazepam (ATIVAN) tablet 1 mg (not administered)    Or  LORazepam (ATIVAN) injection 1 mg (not administered)  thiamine (VITAMIN B-1) tablet 100 mg (not administered)    Or  thiamine (B-1) injection 100 mg (not administered)  folic acid (FOLVITE) tablet 1 mg (not administered)  multivitamin with minerals tablet 1 tablet (not administered)  mupirocin  ointment (BACTROBAN) 2 % 1 application (not administered)  Chlorhexidine Gluconate Cloth 2 % PADS 6 each (not administered)  ondansetron (ZOFRAN) injection 4 mg (4 mg Intravenous Given 12/01/16 1530)  furosemide (LASIX) injection 60 mg (60 mg Intravenous Given 12/01/16 1530)     Initial Impression / Assessment and Plan / ED Course  I have reviewed the triage vital signs and the nursing notes.  Pertinent labs & imaging results that were available during my care of the patient were reviewed by me and considered in my medical decision making (see chart for details).     Final Clinical Impressions(s) / ED Diagnoses   Final diagnoses:  Congestive heart failure, unspecified HF chronicity, unspecified heart failure type (HCC)    Labs: I-STAT troponin, BMP, CBC, BNP  Imaging: DG Chest 2 View  Consults: [facility]  Therapeutics: lasix   Discharge Meds:   Assessment/Plan: 46 year old male presents today with signs consistent with heart failure.  Patient has a history of the same.  This is likely secondary to medication noncompliance.  Patient will receive Lasix down here in the emergency room, hospitalist service will be consulted for admission and ongoing management.     New Prescriptions Current Discharge Medication List       Rosalio Loud 12/01/16 2055    Alvira Monday, MD 12/03/16 [phone]

## 2016-12-01 NOTE — ED Triage Notes (Signed)
Patient complains of abdominal swelling and increased shortness of breath x 2 days, denies CP. States that he has CHF and thinks related. No ankle swelling noted

## 2016-12-01 NOTE — ED Notes (Signed)
Talked to Burna Forts related to BP, provider stated to wait for hospitalist for BP medication

## 2016-12-02 ENCOUNTER — Encounter (HOSPITAL_COMMUNITY): Payer: Self-pay | Admitting: Cardiology

## 2016-12-02 ENCOUNTER — Inpatient Hospital Stay (HOSPITAL_COMMUNITY): Payer: Medicaid Other

## 2016-12-02 DIAGNOSIS — Z87898 Personal history of other specified conditions: Secondary | ICD-10-CM

## 2016-12-02 DIAGNOSIS — I10 Essential (primary) hypertension: Secondary | ICD-10-CM

## 2016-12-02 DIAGNOSIS — N179 Acute kidney failure, unspecified: Secondary | ICD-10-CM

## 2016-12-02 DIAGNOSIS — R748 Abnormal levels of other serum enzymes: Secondary | ICD-10-CM

## 2016-12-02 DIAGNOSIS — I428 Other cardiomyopathies: Secondary | ICD-10-CM

## 2016-12-02 DIAGNOSIS — I5023 Acute on chronic systolic (congestive) heart failure: Secondary | ICD-10-CM

## 2016-12-02 LAB — BASIC METABOLIC PANEL
ANION GAP: 13 (ref 5–15)
BUN: 20 mg/dL (ref 6–20)
CHLORIDE: 100 mmol/L — AB (ref 101–111)
CO2: 26 mmol/L (ref 22–32)
CREATININE: 1.51 mg/dL — AB (ref 0.61–1.24)
Calcium: 8.6 mg/dL — ABNORMAL LOW (ref 8.9–10.3)
GFR calc non Af Amer: 54 mL/min — ABNORMAL LOW (ref >60)
Glucose, Bld: 87 mg/dL (ref 65–99)
POTASSIUM: 3.2 mmol/L — AB (ref 3.5–5.1)
Sodium: 139 mmol/L (ref 135–145)

## 2016-12-02 LAB — TROPONIN I
Troponin I: 0.3 ng/mL (ref <0.03)
Troponin I: 0.4 ng/mL (ref <0.03)

## 2016-12-02 LAB — URINALYSIS, ROUTINE W REFLEX MICROSCOPIC
Bacteria, UA: NONE SEEN
Bilirubin Urine: NEGATIVE
Glucose, UA: NEGATIVE mg/dL
HGB URINE DIPSTICK: NEGATIVE
Ketones, ur: NEGATIVE mg/dL
Leukocytes, UA: NEGATIVE
Nitrite: NEGATIVE
PROTEIN: 100 mg/dL — AB
SPECIFIC GRAVITY, URINE: 1.017 (ref 1.005–1.030)
SQUAMOUS EPITHELIAL / LPF: NONE SEEN
pH: 5 (ref 5.0–8.0)

## 2016-12-02 LAB — ECHOCARDIOGRAM COMPLETE
HEIGHTINCHES: 69 in
WEIGHTICAEL: 2824 [oz_av]

## 2016-12-02 LAB — MRSA PCR SCREENING: MRSA BY PCR: POSITIVE — AB

## 2016-12-02 LAB — HEPARIN LEVEL (UNFRACTIONATED): HEPARIN UNFRACTIONATED: 0.45 [IU]/mL (ref 0.30–0.70)

## 2016-12-02 MED ORDER — PERFLUTREN LIPID MICROSPHERE
1.0000 mL | INTRAVENOUS | Status: AC | PRN
  Administered 2016-12-02: 2 mL via INTRAVENOUS
  Filled 2016-12-02: qty 10

## 2016-12-02 MED ORDER — POTASSIUM CHLORIDE CRYS ER 20 MEQ PO TBCR
40.0000 meq | EXTENDED_RELEASE_TABLET | Freq: Two times a day (BID) | ORAL | Status: DC
  Administered 2016-12-02 – 2016-12-04 (×6): 40 meq via ORAL
  Filled 2016-12-02 (×4): qty 2
  Filled 2016-12-02: qty 4
  Filled 2016-12-02: qty 2

## 2016-12-02 MED ORDER — HEPARIN BOLUS VIA INFUSION
4000.0000 [IU] | Freq: Once | INTRAVENOUS | Status: AC
  Administered 2016-12-02: 4000 [IU] via INTRAVENOUS
  Filled 2016-12-02: qty 4000

## 2016-12-02 MED ORDER — SPIRONOLACTONE 25 MG PO TABS
12.5000 mg | ORAL_TABLET | Freq: Every day | ORAL | Status: DC
  Administered 2016-12-03 – 2016-12-05 (×3): 12.5 mg via ORAL
  Filled 2016-12-02 (×3): qty 1

## 2016-12-02 MED ORDER — HEPARIN (PORCINE) IN NACL 100-0.45 UNIT/ML-% IJ SOLN
950.0000 [IU]/h | INTRAMUSCULAR | Status: DC
  Administered 2016-12-02 – 2016-12-03 (×2): 1300 [IU]/h via INTRAVENOUS
  Administered 2016-12-04: 1100 [IU]/h via INTRAVENOUS
  Filled 2016-12-02 (×3): qty 250

## 2016-12-02 NOTE — Progress Notes (Signed)
Echo result reviewed:LVEF to 10-15 % and two apical thrombi in left ventricle. I reviewed this result with the patient. Pharmacy consulted for further dose IV heparin for anticoagulation. Continue current management. Follow-up cardiology for further evaluation.

## 2016-12-02 NOTE — Progress Notes (Signed)
  Echocardiogram 2D Echocardiogram has been performed.  Dwayne Begay L Androw 12/02/2016, 8:45 AM

## 2016-12-02 NOTE — Progress Notes (Signed)
ANTICOAGULATION CONSULT NOTE -follow up  Pharmacy Consult for warfarin Indication: LV thrombus  Allergies  Allergen Reactions  . Celexa [Citalopram Hydrobromide] Other (See Comments)    DISORIENTED  . Lisinopril Cough    Patient Measurements: Height: 5\' 9"  (175.3 cm) Weight: 176 lb 8 oz (80.1 kg) IBW/kg (Calculated) : 70.7  Vital Signs: Temp: 97.8 F (36.6 C) (09/03 2034) Temp Source: Oral (09/03 2034) BP: 102/75 (09/03 2034) Pulse Rate: 95 (09/03 2034)  Labs:  Recent Labs  12/01/16 1347 12/01/16 1751 12/01/16 2358 12/02/16 0543 12/02/16 2117  HGB 13.8  --   --   --   --   HCT 39.0  --   --   --   --   PLT 270  --   --   --   --   HEPARINUNFRC  --   --   --   --  0.45  CREATININE 1.40*  --   --  1.51*  --   TROPONINI  --  0.50* 0.40* 0.30*  --     Estimated Creatinine Clearance: 61.1 mL/min (A) (by C-G formula based on SCr of 1.51 mg/dL (H)).   Medical History: Past Medical History:  Diagnosis Date  . Anxiety   . CHF (congestive heart failure) (HCC)   . Essential hypertension   . History of alcohol abuse    a. Sober since Feb 2016.  . 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 NICM (nonischemic cardiomyopathy) (HCC)    a. 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    Medications:  Prescriptions Prior to Admission  Medication Sig Dispense Refill Last Dose  . furosemide (LASIX) 20 MG tablet TAKE 2 TABLETS BY MOUTH AS NEEDED (Patient taking differently: TAKE 20 mg TABLETS BY MOUTH AS NEEDED) 60 tablet 0 12/01/2016 at Unknown time  . spironolactone (ALDACTONE) 25 MG tablet TAKE 1 TABLET BY MOUTH ONCE DAILY (Patient taking differently: TAKE 25 mg TABLET BY MOUTH ONCE DAILY) 34 tablet 2 12/01/2016 at Unknown time  . carvedilol (COREG) 3.125 MG tablet TAKE 1  TABLET (3.125 MG TOTAL) BY MOUTH 2 (TWO) TIMES DAILY. (Patient not taking: Reported on 12/01/2016) 60 tablet 3 Not Taking at Unknown time  . isosorbide-hydrALAZINE (BIDIL) 20-37.5 MG tablet Take 2 tablets by mouth 3 (three) times daily. (Patient not taking: Reported on 12/01/2016) 180 tablet 3 Not Taking at Unknown time  . sacubitril-valsartan (ENTRESTO) 97-103 MG Take 1 tablet by mouth 2 (two) times daily. (Patient not taking: Reported on 12/01/2016) 60 tablet 11 Not Taking at Unknown time    Assessment: 25 YOM with h/o of LV mural thrombus that resolved on Coumadin now presents with acute on chronic systolic heart failure. A TTE on 9/3 revealed an LVEF of 10-15% with two apical thrombi in left ventricle. Pharmacy consulted to start IV heparin. H/H and Plt stable.  Patient received a dose of Lovenox 40 mg SQ yesterday at 2132.  Initial 6 hr HL = 0.45, therapeutic on heparin drip 1300 units/hr for LV thrombus   Goal of Therapy:  Heparin level 0.3-0.7 units/ml Monitor platelets by anticoagulation protocol: Yes   Plan:   Continue IV heparin infusion at 1300 units/hr  -F/u 6 hr HL -Monitor daily HL, CBC and s/s of bleeding   Noah Delaine, RPh Clinical Pharmacist Pager: [phone] 12/02/2016  10:56 PM

## 2016-12-02 NOTE — Progress Notes (Signed)
Troponin 0.50, no s/s, was 0.42, MD notified, will continue to monitor, Thanks Lavonda Jumbo RN.

## 2016-12-02 NOTE — Care Management Note (Signed)
Case Management Note  Patient Details  Name: Robert Booth MRN: [id number] Date of Birth: [birth year]  Subjective/Objective:    CHF               Action/Plan: Patient lives at home; PCP: Dessa Phi, MD at the [facility] for primary care; also at discharge pt can go to the Clinic to get his prescriptions filled. Financial Counselor to see pt also; CM following for DCP.  Expected Discharge Date:  12/04/16               Expected Discharge Plan:  Home/Self Care  In-House Referral:   Financial Counselor  Discharge planning Services  CM Consult, [facility]  Status of Service:  In process, will continue to follow  Reola Mosher [phone] 12/02/2016, 8:41 AM

## 2016-12-02 NOTE — Progress Notes (Signed)
PROGRESS NOTE    Robert Booth  KGY:[id number] DOB: December 30, 1970 DOA: 12/01/2016 PCP: Dessa Phi, MD   Brief Narrative:  47 year old male with history of alcoholism, nonischemic cardiomyopathy with chronic systolic heart failure, hypertension, history of left ventricular mural thrombus presented with dyspnea on exertion and orthopnea. Patient with CHF exacerbation, elevated troponin. He was not taking his medications for last 3-4 months because of issue with Medicaid. Elevated BNP of 2326 on admission.  Assessment & Plan:  # Acute on chronic combined systolic and diastolic heart failure/nonischemic cardiomyopathy: Patient reported that shortness of breath is better but not at baseline. -Plan to continue IV Lasix, BiDil and Coreg. Discontinue Entresto because of renal failure and patient was not taking for last 24 months  -Follow up echocardiogram -Cardiology consult requested -Case manager to help with medications/insurance on discharge  #Uncontrolled hypertension on admission in the setting of medication noncompliance: Blood pressure improved. Continue current cardiac medications.  #History of alcohol use. Continue supportive care. Reported not drinking for more than a year now.  #Elevated troponin likely demand ischemia in the setting of heart failure. No chest pain. Follow-up echocardiogram.  #Acute kidney injury likely in the setting of heart failure. Elevation in creatinine today. Holding [place]. Monitor BMP. Avoid nephrotoxins. Check UA.  #Hypokalemia: Replete potassium chloride.  DVT prophylaxis: Lovenox subcutaneous Code Status: Full code Family Communication: No family at bedside Disposition Plan: Currently admitted    Consultants:   Cardiology  Procedures: None Antimicrobials: None  Subjective: Seen and examined at bedside. Reported improvement in shortness of breath but not at baseline. Feels fatigued. No chest pain, nausea or  vomiting.  Objective: Vitals:   12/01/16 2045 12/01/16 2356 12/02/16 0500 12/02/16 1200  BP: (!) 147/107 (!) 131/91 (!) 116/96 123/86  Pulse: (!) 108 97 91 83  Resp: 18 18 18 20   Temp: (!) 97.5 F (36.4 C) 97.6 F (36.4 C) 98.1 F (36.7 C) 98.2 F (36.8 C)  TempSrc: Oral Oral Oral Oral  SpO2: 98% 100% 100% 100%  Weight:   80.1 kg (176 lb 8 oz)   Height:        Intake/Output Summary (Last 24 hours) at 12/02/16 1302 Last data filed at 12/02/16 0856  Gross per 24 hour  Intake              480 ml  Output             1875 ml  Net            -1395 ml   Filed Weights   12/01/16 1646 12/02/16 0500  Weight: 83.4 kg (183 lb 12.8 oz) 80.1 kg (176 lb 8 oz)    Examination:  General exam: Appears calm and comfortable  Respiratory system: Clear to auscultation. Respiratory effort normal. No wheezing or crackle Cardiovascular system: S1 & S2 heard, RRR.  Trace pedal edema. Gastrointestinal system: Abdomen is nondistended, soft and nontender. Normal bowel sounds heard. Central nervous system: Alert and oriented. No focal neurological deficits. Extremities: Symmetric 5 x 5 power. Skin: No rashes, lesions or ulcers Psychiatry: Judgement and insight appear normal. Mood & affect appropriate.     Data Reviewed: I have personally reviewed following labs and imaging studies  CBC:  Recent Labs Lab 12/01/16 1347  WBC 12.0*  HGB 13.8  HCT 39.0  MCV 85.2  PLT 270   Basic Metabolic Panel:  Recent Labs Lab 12/01/16 1347 12/02/16 0543  NA 138 139  K 3.5 3.2*  CL 105 100*  CO2  25 26  GLUCOSE 97 87  BUN 18 20  CREATININE 1.40* 1.51*  CALCIUM 8.9 8.6*   GFR: Estimated Creatinine Clearance: 61.1 mL/min (A) (by C-G formula based on SCr of 1.51 mg/dL (H)). Liver Function Tests: No results for input(s): AST, ALT, ALKPHOS, BILITOT, PROT, ALBUMIN in the last 168 hours. No results for input(s): LIPASE, AMYLASE in the last 168 hours. No results for input(s): AMMONIA in the last 168  hours. Coagulation Profile: No results for input(s): INR, PROTIME in the last 168 hours. Cardiac Enzymes:  Recent Labs Lab 12/01/16 1751 12/01/16 2358 12/02/16 0543  TROPONINI 0.50* 0.40* 0.30*   BNP (last 3 results) No results for input(s): PROBNP in the last 8760 hours. HbA1C: No results for input(s): HGBA1C in the last 72 hours. CBG: No results for input(s): GLUCAP in the last 168 hours. Lipid Profile: No results for input(s): CHOL, HDL, LDLCALC, TRIG, CHOLHDL, LDLDIRECT in the last 72 hours. Thyroid Function Tests: No results for input(s): TSH, T4TOTAL, FREET4, T3FREE, THYROIDAB in the last 72 hours. Anemia Panel: No results for input(s): VITAMINB12, FOLATE, FERRITIN, TIBC, IRON, RETICCTPCT in the last 72 hours. Sepsis Labs: No results for input(s): PROCALCITON, LATICACIDVEN in the last 168 hours.  Recent Results (from the past 240 hour(s))  MRSA PCR Screening     Status: Abnormal   Collection Time: 12/01/16  6:18 PM  Result Value Ref Range Status   MRSA by PCR POSITIVE (A) NEGATIVE Corrected    Comment:        The GeneXpert MRSA Assay (FDA approved for NASAL specimens only), is one component of a comprehensive MRSA colonization surveillance program. It is not intended to diagnose MRSA infection nor to guide or monitor treatment for MRSA infections. RESULT CALLED TO, READ BACK BY AND VERIFIED WITH: RN Charise Killian [id number] MLM CORRECTED ON 09/03 AT 0706: PREVIOUSLY REPORTED AS POSITIVE        The GeneXpert MRSA Assay (FDA approved for NASAL specimens only), is one component of a comprehensive MRSA colonization surveillance program. It is not intended to diagnose MRSA infection  nor to guide or monitor treatment for MRSA infections. CRITICAL RESULT CALLED TO, READ BACK BY AND VERIFIED WITH: RN Charise Killian [id number] MLM          Radiology Studies: Dg Chest 2 View  Result Date: 12/01/2016 CLINICAL DATA:  Pt reports tightness across his abdomen, dry cough, and  SOB x 2 days but worse today; pt reports hx of HTN and CHF. He reports he's been out of his HTN meds. Former smoker EXAM: CHEST  2 VIEW COMPARISON:  11/15/2014 FINDINGS: The rim density in the right mid lung has intervally cleared. A round density at the right lung base is almost nearly cleared, although there is some bandlike residual density in this vicinity which could be from scarring or atelectasis. Nevertheless, the significant improvement strongly argues against a neoplastic etiology. Mild enlargement of the cardiopericardial silhouette, with prominence of upper zone pulmonary vasculature but without overt edema. The right IJ line is no longer present. No pleural effusion. IMPRESSION: 1. Continued improvement, with resolution of the prior rounded density in the right mid lung, and prominent improvement of the rounded density at the right lung base. At the right lung base there is some residual bandlike density in volume loss probably from scarring or atelectasis. 2. Mild enlargement of the cardiopericardial silhouette, with cephalization of blood flow suggesting pulmonary venous hypertension, but no overt edema. Electronically Signed   By: Zollie Beckers  Ova Freshwater M.D.   On: 12/01/2016 14:06        Scheduled Meds: . carvedilol  3.125 mg Oral BID WC  . Chlorhexidine Gluconate Cloth  6 each Topical Q0600  . enoxaparin (LOVENOX) injection  40 mg Subcutaneous Q24H  . folic acid  1 mg Oral Daily  . furosemide  60 mg Intravenous Q12H  . isosorbide-hydrALAZINE  2 tablet Oral TID  . multivitamin with minerals  1 tablet Oral Daily  . mupirocin ointment  1 application Nasal BID  . potassium chloride  40 mEq Oral BID  . sodium chloride flush  3 mL Intravenous Q12H  . [START ON 12/03/2016] spironolactone  12.5 mg Oral Daily  . thiamine  100 mg Oral Daily   Or  . thiamine  100 mg Intravenous Daily   Continuous Infusions: . sodium chloride       LOS: 1 day    Katheryne Gorr Jaynie Collins, MD [facility] Pager [phone]  If 7PM-7AM, please contact night-coverage [email or web address] Password [email or web address] 12/02/2016, 1:02 PM

## 2016-12-02 NOTE — Consult Note (Addendum)
Cardiology Consultation:   Patient ID: Robert Booth; [id number]; January 30, 1971   Admit date: 12/01/2016 Date of Consult: 12/02/2016  Primary Care Provider: Dessa Phi, MD Primary Cardiologist: Dr. Nicholes Mango   Patient Profile:   Robert Booth is a 46 y.o. male with a history of nonischemic cardiomyopathy, previous LV mural thrombus that resolved on Coumadin, alcohol abuse in remission, and hypertension who is being seen today for the evaluation of acute on chronic systolic heart failure at the request of Dr. Melynda Ripple.  History of Present Illness:   Robert Booth presents to the hospital reporting worsening orthopnea, dyspnea on exertion, and abdominal fullness with symptom onset over the last week. He states that he lost his health insurance about 4 months ago and has been off of his regular medications during this time. Also reports less activity and more sodium intake in his diet. At one point he was doing quite well on medical therapy, was actually able to run for a few miles at a time for exercise per his report, was last seen in the heart failure clinic by Dr. Gala Romney back in August 2017.  Last echocardiogram in August 2017 indicated LVEF 35-40% range with grade 2 diastolic dysfunction. His initial LVEF had been as low as 5-10%. Previous outpatient cardiac regimen including Coreg, Lasix, BiDil, Entresto, and Aldactone.  He has been admitted for diuresis with acute on chronic systolic heart failure, currently on Lasix 60 mg IV every 12 hours. He has approximately 1600 cc out more than and so far and is beginning to feel somewhat better. He is noted to have renal insufficiency with creatinine 1.4-1.5 range, previously 1.1 last year.  Past Medical History:  Diagnosis Date  . Anxiety   . CHF (congestive heart failure) (HCC)   . Essential hypertension   . History of alcohol abuse    a. Sober since Feb 2016.  . 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  NICM (nonischemic cardiomyopathy) (HCC)    a. 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    Past Surgical History:  Procedure Laterality Date  . CARDIAC CATHETERIZATION  05/2014   In [place], clean cors  . HERNIA REPAIR Right Inguinal  . KNEE SURGERY Right 1998     Inpatient Medications: Scheduled Meds: . carvedilol  3.125 mg Oral BID WC  . Chlorhexidine Gluconate Cloth  6 each Topical Q0600  . enoxaparin (LOVENOX) injection  40 mg Subcutaneous Q24H  . folic acid  1 mg Oral Daily  . furosemide  60 mg Intravenous Q12H  . isosorbide-hydrALAZINE  2 tablet Oral TID  . multivitamin with minerals  1 tablet Oral Daily  . mupirocin ointment  1 application Nasal BID  . potassium chloride  40 mEq Oral BID  . sodium chloride flush  3 mL Intravenous Q12H  . spironolactone  25 mg Oral Daily  . thiamine  100 mg Oral Daily   Or  . thiamine  100 mg Intravenous Daily   Continuous Infusions: . sodium chloride     PRN Meds: sodium chloride, acetaminophen, LORazepam **OR** LORazepam, ondansetron (ZOFRAN) IV, sodium chloride flush  Allergies:    Allergies  Allergen Reactions  . Celexa [Citalopram Hydrobromide] Other (See Comments)    DISORIENTED  . Lisinopril Cough    Social History:   Social History  Social History  . Marital status: Single    Spouse name: N/A  . Number of children: 6  . Years of education: N/A   Occupational History  . Unemployed    Social History Main Topics  . Smoking status: Former Smoker    Types: Cigarettes  . Smokeless tobacco: Former Neurosurgeon    Types: Chew     Comment: Was a rare smoker  . Alcohol use No  . Drug use: No  . Sexual activity: Yes   Other Topics Concern  . Not on file   Social History Narrative   Lives with mom.      Family History:   The patient's  family history includes Heart attack (age of onset: 47) in his paternal grandfather; Heart attack (age of onset: 83) in his father; Hypertension in his father, mother, and sister.  ROS:  Please see the history of present illness. Reports no angina symptoms, no palpitations or syncope. Stable appetite.  All other ROS reviewed and negative.     Physical Exam/Data:   Vitals:   12/01/16 1707 12/01/16 2045 12/01/16 2356 12/02/16 0500  BP:  (!) 147/107 (!) 131/91 (!) 116/96  Pulse:  (!) 108 97 91  Resp:  18 18 18   Temp:  (!) 97.5 F (36.4 C) 97.6 F (36.4 C) 98.1 F (36.7 C)  TempSrc:  Oral Oral Oral  SpO2: 100% 98% 100% 100%  Weight:    176 lb 8 oz (80.1 kg)  Height:        Intake/Output Summary (Last 24 hours) at 12/02/16 1133 Last data filed at 12/02/16 0856  Gross per 24 hour  Intake              480 ml  Output             1875 ml  Net            -1395 ml   Filed Weights   12/01/16 1646 12/02/16 0500  Weight: 183 lb 12.8 oz (83.4 kg) 176 lb 8 oz (80.1 kg)   Body mass index is 26.06 kg/m.   Gen: Patient appears comfortable at rest. HEENT: Conjunctiva and lids normal, oropharynx clear. Neck: Supple, mildly elevated JVP, no carotid bruits, no thyromegaly. Lungs: Diminished breath sounds at the bases without crackles, nonlabored breathing at rest. Cardiac: Regular rate and rhythm, no S3, soft systolic murmur, no pericardial rub. Abdomen: Soft, nontender, bowel sounds present, no guarding or rebound. Extremities: No pitting edema, distal pulses 2+. Skin: Warm and dry. Musculoskeletal: No kyphosis. Neuropsychiatric: Alert and oriented x3, affect grossly appropriate.  EKG:  I personally reviewed the tracing from 12/01/2016 which showed sinus tachycardia with leftward axis, biatrial enlargement, nonspecific ST changes.  Telemetry:  I personally reviewed telemetry which shows sinus rhythm and sinus tachycardia.  Relevant CV Studies:  Echocardiogram 11/30/2015: Study  Conclusions  - Left ventricle: The cavity size was normal. Wall thickness was   normal. Systolic function was moderately reduced. The estimated   ejection fraction was in the range of 35% to 40%. Diffuse   hypokinesis. Features are consistent with a pseudonormal left   ventricular filling pattern, with concomitant abnormal relaxation   and increased filling pressure (grade 2 diastolic dysfunction). - Aortic valve: There was no stenosis. - Mitral valve: Mildly calcified annulus. Normal thickness leaflets   . There was trivial regurgitation. - Left atrium: The atrium was mildly dilated. - Right ventricle: The cavity size was normal. Systolic function   was normal. -  Inferior vena cava: The vessel was normal in size. The   respirophasic diameter changes were in the normal range (>= 50%),   consistent with normal central venous pressure.  Impressions:  - Normal LV size with EF 35-40%. Diffuse hypokinesis. Normal RV   size and systolic function. No significant valvular   abnormalities.  Laboratory Data:  Chemistry  Recent Labs Lab 12/01/16 1347 12/02/16 0543  NA 138 139  K 3.5 3.2*  CL 105 100*  CO2 25 26  GLUCOSE 97 87  BUN 18 20  CREATININE 1.40* 1.51*  CALCIUM 8.9 8.6*  GFRNONAA 59* 54*  GFRAA >60 >60  ANIONGAP 8 13    Hematology  Recent Labs Lab 12/01/16 1347  WBC 12.0*  RBC 4.58  HGB 13.8  HCT 39.0  MCV 85.2  MCH 30.1  MCHC 35.4  RDW 13.2  PLT 270   Cardiac Enzymes  Recent Labs Lab 12/01/16 1751 12/01/16 2358 12/02/16 0543  TROPONINI 0.50* 0.40* 0.30*     Recent Labs Lab 12/01/16 1412  TROPIPOC 0.42*    BNP  Recent Labs Lab 12/01/16 1347  BNP 2,326.1*    Radiology/Studies:  Dg Chest 2 View  Result Date: 12/01/2016 CLINICAL DATA:  Pt reports tightness across his abdomen, dry cough, and SOB x 2 days but worse today; pt reports hx of HTN and CHF. He reports he's been out of his HTN meds. Former smoker EXAM: CHEST  2 VIEW COMPARISON:   11/15/2014 FINDINGS: The rim density in the right mid lung has intervally cleared. A round density at the right lung base is almost nearly cleared, although there is some bandlike residual density in this vicinity which could be from scarring or atelectasis. Nevertheless, the significant improvement strongly argues against a neoplastic etiology. Mild enlargement of the cardiopericardial silhouette, with prominence of upper zone pulmonary vasculature but without overt edema. The right IJ line is no longer present. No pleural effusion. IMPRESSION: 1. Continued improvement, with resolution of the prior rounded density in the right mid lung, and prominent improvement of the rounded density at the right lung base. At the right lung base there is some residual bandlike density in volume loss probably from scarring or atelectasis. 2. Mild enlargement of the cardiopericardial silhouette, with cephalization of blood flow suggesting pulmonary venous hypertension, but no overt edema. Electronically Signed   By: Gaylyn Rong M.D.   On: 12/01/2016 14:06    Assessment and Plan:   1. Suspected acute on chronic systolic heart failure with volume overload. Noted in the setting of noncompliance with medications as outlined above. Currently on IV Lasix and diuresing with some symptomatic improvement.  2. History of nonischemic cardiomyopathy, last LVEF 35-40% as of August 2017. Patient has followed with Dr. Gala Romney in the heart failure clinic, but not seen over the last year. Previous outpatient cardiac regimen including Coreg, Lasix, BiDil, Entresto, and Aldactone. Patient became ineligible for Medicaid earlier this year and at that point was not able to obtain his medications.  3. Mild troponin I elevation in flat pattern most consistent with heart failure rather than ACS.  4. History of alcohol abuse in remission. Patient reports no return to alcohol.  5. Essential hypertension.  6. Acute renal insufficiency,  creatinine 1.4-1.5 range.  Follow-up echocardiogram pending for reassessment of cardiac structure and function. Agree with IV Lasix for now, follow urine output and renal function, he is reporting symptomatic improvement. Would reduce Aldactone to 12.5 mg daily. Would not restart Entresto at this time  until renal function stabilizes. Check LFTs. Continue BiDil and low-dose Coreg. Will need to reestablish regular follow-up with heart failure team.  Signed, Nona Dell, MD  12/02/2016 11:33 AM

## 2016-12-02 NOTE — Progress Notes (Signed)
ANTICOAGULATION CONSULT NOTE - Initial Consult  Pharmacy Consult for warfarin Indication: LV thrombus  Allergies  Allergen Reactions  . Celexa [Citalopram Hydrobromide] Other (See Comments)    DISORIENTED  . Lisinopril Cough    Patient Measurements: Height: 5\' 9"  (175.3 cm) Weight: 176 lb 8 oz (80.1 kg) IBW/kg (Calculated) : 70.7  Vital Signs: Temp: 98.2 F (36.8 C) (09/03 1200) Temp Source: Oral (09/03 1200) BP: 123/86 (09/03 1200) Pulse Rate: 83 (09/03 1200)  Labs:  Recent Labs  12/01/16 1347 12/01/16 1751 12/01/16 2358 12/02/16 0543  HGB 13.8  --   --   --   HCT 39.0  --   --   --   PLT 270  --   --   --   CREATININE 1.40*  --   --  1.51*  TROPONINI  --  0.50* 0.40* 0.30*    Estimated Creatinine Clearance: 61.1 mL/min (A) (by C-G formula based on SCr of 1.51 mg/dL (H)).   Medical History: Past Medical History:  Diagnosis Date  . Anxiety   . CHF (congestive heart failure) (HCC)   . Essential hypertension   . History of alcohol abuse    a. Sober since Feb 2016.  . 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 NICM (nonischemic cardiomyopathy) (HCC)    a. 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    Medications:  Prescriptions Prior to Admission  Medication Sig Dispense Refill Last Dose  . furosemide (LASIX) 20 MG tablet TAKE 2 TABLETS BY MOUTH AS NEEDED (Patient taking differently: TAKE 20 mg TABLETS BY MOUTH AS NEEDED) 60 tablet 0 12/01/2016 at Unknown time  . spironolactone (ALDACTONE) 25 MG tablet TAKE 1 TABLET BY MOUTH ONCE DAILY (Patient taking differently: TAKE 25 mg TABLET BY MOUTH ONCE DAILY) 34 tablet 2 12/01/2016 at Unknown time  . carvedilol (COREG) 3.125 MG tablet TAKE 1 TABLET (3.125 MG TOTAL) BY MOUTH 2 (TWO) TIMES DAILY. (Patient not taking:  Reported on 12/01/2016) 60 tablet 3 Not Taking at Unknown time  . isosorbide-hydrALAZINE (BIDIL) 20-37.5 MG tablet Take 2 tablets by mouth 3 (three) times daily. (Patient not taking: Reported on 12/01/2016) 180 tablet 3 Not Taking at Unknown time  . sacubitril-valsartan (ENTRESTO) 97-103 MG Take 1 tablet by mouth 2 (two) times daily. (Patient not taking: Reported on 12/01/2016) 60 tablet 11 Not Taking at Unknown time    Assessment: 59 YOM with h/o of LV mural thrombus that resolved on Coumadin now presents with acute on chronic systolic heart failure. A TTE on 9/3 revealed an LVEF of 10-15% with two apical thrombi in left ventricle. Pharmacy consulted to start IV heparin. H/H and Plt stable. Patient is currently on Lovenox for VTE prophylaxis. He received a dose of 40 mg SQ yesterday at 2132.   Goal of Therapy:  Heparin level 0.3-0.7 units/ml Monitor platelets by anticoagulation protocol: Yes   Plan:  -Stop SQ lovenox  -Heparin 4000 units IV bolus followed by heparin infusion at 1300 units/hr  -F/u 6 hr HL -Monitor daily HL, CBC and s/s of bleeding   Vinnie Level, PharmD., BCPS Clinical Pharmacist Pager [phone]

## 2016-12-03 DIAGNOSIS — I5043 Acute on chronic combined systolic (congestive) and diastolic (congestive) heart failure: Secondary | ICD-10-CM

## 2016-12-03 DIAGNOSIS — I513 Intracardiac thrombosis, not elsewhere classified: Secondary | ICD-10-CM

## 2016-12-03 LAB — BASIC METABOLIC PANEL
ANION GAP: 10 (ref 5–15)
BUN: 26 mg/dL — AB (ref 6–20)
CO2: 28 mmol/L (ref 22–32)
Calcium: 8.6 mg/dL — ABNORMAL LOW (ref 8.9–10.3)
Chloride: 101 mmol/L (ref 101–111)
Creatinine, Ser: 1.65 mg/dL — ABNORMAL HIGH (ref 0.61–1.24)
GFR calc Af Amer: 56 mL/min — ABNORMAL LOW (ref >60)
GFR, EST NON AFRICAN AMERICAN: 48 mL/min — AB (ref >60)
GLUCOSE: 85 mg/dL (ref 65–99)
POTASSIUM: 3.9 mmol/L (ref 3.5–5.1)
SODIUM: 139 mmol/L (ref 135–145)

## 2016-12-03 LAB — CBC
HEMATOCRIT: 39.7 % (ref 39.0–52.0)
Hemoglobin: 14 g/dL (ref 13.0–17.0)
MCH: 30 pg (ref 26.0–34.0)
MCHC: 35.3 g/dL (ref 30.0–36.0)
MCV: 85.2 fL (ref 78.0–100.0)
Platelets: 181 10*3/uL (ref 150–400)
RBC: 4.66 MIL/uL (ref 4.22–5.81)
RDW: 13.3 % (ref 11.5–15.5)
WBC: 9.9 10*3/uL (ref 4.0–10.5)

## 2016-12-03 LAB — HEPARIN LEVEL (UNFRACTIONATED): Heparin Unfractionated: 0.46 IU/mL (ref 0.30–0.70)

## 2016-12-03 LAB — HEPATIC FUNCTION PANEL
ALT: 50 U/L (ref 17–63)
AST: 53 U/L — AB (ref 15–41)
Albumin: 3.2 g/dL — ABNORMAL LOW (ref 3.5–5.0)
Alkaline Phosphatase: 73 U/L (ref 38–126)
Bilirubin, Direct: 0.3 mg/dL (ref 0.1–0.5)
Indirect Bilirubin: 1.2 mg/dL — ABNORMAL HIGH (ref 0.3–0.9)
TOTAL PROTEIN: 6.6 g/dL (ref 6.5–8.1)
Total Bilirubin: 1.5 mg/dL — ABNORMAL HIGH (ref 0.3–1.2)

## 2016-12-03 LAB — HIV ANTIBODY (ROUTINE TESTING W REFLEX): HIV Screen 4th Generation wRfx: NONREACTIVE

## 2016-12-03 MED ORDER — WARFARIN SODIUM 10 MG PO TABS
10.0000 mg | ORAL_TABLET | Freq: Once | ORAL | Status: AC
  Administered 2016-12-03: 10 mg via ORAL
  Filled 2016-12-03: qty 1

## 2016-12-03 MED ORDER — WARFARIN - PHARMACIST DOSING INPATIENT
Freq: Every day | Status: DC
  Administered 2016-12-04: 18:00:00

## 2016-12-03 MED ORDER — FUROSEMIDE 40 MG PO TABS
60.0000 mg | ORAL_TABLET | Freq: Two times a day (BID) | ORAL | Status: DC
  Administered 2016-12-03 – 2016-12-05 (×4): 60 mg via ORAL
  Filled 2016-12-03 (×4): qty 1

## 2016-12-03 NOTE — Progress Notes (Signed)
Progress Note  Patient Name: Robert Booth Date of Encounter: 12/03/2016  Primary Cardiologist:   Dr. Gala Romney  Subjective   The patient has less abdominal tightness.  Breathing is better.  No chest pain.   Inpatient Medications    Scheduled Meds: . carvedilol  3.125 mg Oral BID WC  . Chlorhexidine Gluconate Cloth  6 each Topical Q0600  . folic acid  1 mg Oral Daily  . furosemide  60 mg Intravenous Q12H  . isosorbide-hydrALAZINE  2 tablet Oral TID  . multivitamin with minerals  1 tablet Oral Daily  . mupirocin ointment  1 application Nasal BID  . potassium chloride  40 mEq Oral BID  . sodium chloride flush  3 mL Intravenous Q12H  . spironolactone  12.5 mg Oral Daily  . thiamine  100 mg Oral Daily   Continuous Infusions: . sodium chloride    . heparin 1,300 Units/hr (12/03/16 0322)   PRN Meds: sodium chloride, acetaminophen, LORazepam **OR** LORazepam, ondansetron (ZOFRAN) IV, sodium chloride flush   Vital Signs    Vitals:   12/02/16 2034 12/03/16 0621 12/03/16 0623 12/03/16 0818  BP: 102/75 118/88  111/86  Pulse: 95 92  93  Resp: 16 16  12   Temp: 97.8 F (36.6 C) 97.8 F (36.6 C)  97.6 F (36.4 C)  TempSrc: Oral Oral  Oral  SpO2: 99% 99%  99%  Weight:   177 lb 14.4 oz (80.7 kg)   Height:        Intake/Output Summary (Last 24 hours) at 12/03/16 1030 Last data filed at 12/03/16 1002  Gross per 24 hour  Intake          1154.57 ml  Output             1175 ml  Net           -20.43 ml   Filed Weights   12/01/16 1646 12/02/16 0500 12/03/16 0623  Weight: 183 lb 12.8 oz (83.4 kg) 176 lb 8 oz (80.1 kg) 177 lb 14.4 oz (80.7 kg)    Telemetry    NSR, NSVT - Personally Reviewed  ECG    NA - Personally Reviewed  Physical Exam   GEN: No acute distress.   Neck: No  JVD Cardiac: RRR, no murmurs, rubs, or gallops.  Respiratory: Clear  to auscultation bilaterally. GI: Soft, nontender, non-distended  MS: No  edema; No deformity. Neuro:  Nonfocal  Psych:  Normal affect   Labs    Chemistry Recent Labs Lab 12/01/16 1347 12/02/16 0543 12/03/16 0456  NA 138 139 139  K 3.5 3.2* 3.9  CL 105 100* 101  CO2 25 26 28   GLUCOSE 97 87 85  BUN 18 20 26*  CREATININE 1.40* 1.51* 1.65*  CALCIUM 8.9 8.6* 8.6*  PROT  --   --  6.6  ALBUMIN  --   --  3.2*  AST  --   --  53*  ALT  --   --  50  ALKPHOS  --   --  73  BILITOT  --   --  1.5*  GFRNONAA 59* 54* 48*  GFRAA >60 >60 56*  ANIONGAP 8 13 10      Hematology Recent Labs Lab 12/01/16 1347 12/03/16 0456  WBC 12.0* 9.9  RBC 4.58 4.66  HGB 13.8 14.0  HCT 39.0 39.7  MCV 85.2 85.2  MCH 30.1 30.0  MCHC 35.4 35.3  RDW 13.2 13.3  PLT 270 181    Cardiac Enzymes Recent  Labs Lab 12/01/16 1751 12/01/16 2358 12/02/16 0543  TROPONINI 0.50* 0.40* 0.30*    Recent Labs Lab 12/01/16 1412  TROPIPOC 0.42*     BNP Recent Labs Lab 12/01/16 1347  BNP 2,326.1*     DDimer No results for input(s): DDIMER in the last 168 hours.   Radiology    Dg Chest 2 View  Result Date: 12/01/2016 CLINICAL DATA:  Pt reports tightness across his abdomen, dry cough, and SOB x 2 days but worse today; pt reports hx of HTN and CHF. He reports he's been out of his HTN meds. Former smoker EXAM: CHEST  2 VIEW COMPARISON:  11/15/2014 FINDINGS: The rim density in the right mid lung has intervally cleared. A round density at the right lung base is almost nearly cleared, although there is some bandlike residual density in this vicinity which could be from scarring or atelectasis. Nevertheless, the significant improvement strongly argues against a neoplastic etiology. Mild enlargement of the cardiopericardial silhouette, with prominence of upper zone pulmonary vasculature but without overt edema. The right IJ line is no longer present. No pleural effusion. IMPRESSION: 1. Continued improvement, with resolution of the prior rounded density in the right mid lung, and prominent improvement of the rounded density at the right  lung base. At the right lung base there is some residual bandlike density in volume loss probably from scarring or atelectasis. 2. Mild enlargement of the cardiopericardial silhouette, with cephalization of blood flow suggesting pulmonary venous hypertension, but no overt edema. Electronically Signed   By: Gaylyn Rong M.D.   On: 12/01/2016 14:06    Cardiac Studies    ECHO 12/02/16  Study Conclusions  - Left ventricle: The cavity size was moderately dilated. Wall   thickness was normal. Systolic function was severely reduced. The   estimated ejection fraction was in the range of 10% to 15%.   Severe diffuse hypokinesis with no identifiable regional   variations. Doppler parameters are consistent with restrictive   physiology, indicative of decreased left ventricular diastolic   compliance and/or increased left atrial pressure. No evidence of   thrombus. There were 2 apical thrombi, each measuring   approximately 10 x 8 mm, partly mobile. - Aortic valve: Valve area (VTI): 1.31 cm^2. Valve area (Vmax):   1.72 cm^2. Valve area (Vmean): 1.5 cm^2. - Left atrium: The atrium was mildly dilated. - Right ventricle: The cavity size was mildly dilated. Systolic   function was mildly reduced. - Right atrium: The atrium was mildly dilated.  Impressions:  - LV function has deteriorated and new LV thrombi are seen. Patient Profile     46 y.o. male with a history of nonischemic cardiomyopathy, previous LV mural thrombus that resolved on Coumadin, alcohol abuse in remission, and hypertension who is being seen for the evaluation of acute on chronic systolic heart failure at the request of Dr. Melynda Ripple.  Assessment & Plan    ACUTE ON CHRONIC SYSTOLIC HF:  EF is reduced again.  I have reviewed these images.  There also are apical thrombus.  He needs warfarin for this.    ELEVATED TROPONIN:   Troponin trend is flat.   At this point in time I would manage this medically and not plan any invasive  work up.  He could be considered for future out patient perfusion study after he has had med titration.    HTN:  This is being managed in the context of treating his CHF.  ACUTE RENAL INSUFFICIENCY:  Creat is up.  Signed, Rollene Rotunda, MD  12/03/2016, 10:30 AM

## 2016-12-03 NOTE — Progress Notes (Signed)
PROGRESS NOTE    Robert Booth  ZOX:[id number] DOB: 10-30-1970 DOA: 12/01/2016 PCP: Dessa Phi, MD   Brief Narrative:  46 year old male with history of alcoholism, nonischemic cardiomyopathy with chronic systolic heart failure, hypertension, history of left ventricular mural thrombus presented with dyspnea on exertion and orthopnea. Patient with CHF exacerbation, elevated troponin. He was not taking his medications for last 3-4 months because of issue with Medicaid. Elevated BNP of 2326 on admission.  Assessment & Plan:  # Acute on chronic combined systolic and diastolic heart failure/nonischemic cardiomyopathy:  -Patient reported mild shortness of breath today, overall better than yesterday. Elevation in serum creatinine level therefore switched to oral Lasix. Continue BiDil, Coreg. -Discontinue Entresto because of renal failure  -Echocardiogram with EF of 10-15% and left ventricular thrombus. -Cardiology consult appreciated -Case manager to help with medications/insurance on discharge  #LV thrombus: Started IV heparin yesterday. Starting Coumadin. Monitor PT/INR. Discussed with the patient and he verbalized understanding.  #Uncontrolled hypertension on admission in the setting of medication noncompliance: Blood pressure improved. Continue current cardiac medications.  #History of alcohol use. Continue supportive care. Reported not drinking for more than a year now.  #Elevated troponin likely demand ischemia in the setting of heart failure. No chest pain. F  #Acute kidney injury likely in the setting of heart failure. Mild elevation in serum creatinine level likely hemodynamically mediated in the setting of diuretics. Holding Superior. Monitor BMP. Avoid nephrotoxins.   #Hypokalemia: Replete potassium chloride. Improved  DVT prophylaxis: IV heparin Code Status: Full code Family Communication: No family at bedside Disposition Plan: Currently admitted    Consultants:    Cardiology  Procedures: None Antimicrobials: None  Subjective: Seen and examined at bedside. Mild shortness of breath. Denied headache, dizziness, chest pain, . Objective: Vitals:   12/02/16 2034 12/03/16 0621 12/03/16 0623 12/03/16 0818  BP: 102/75 118/88  111/86  Pulse: 95 92  93  Resp: 16 16  12   Temp: 97.8 F (36.6 C) 97.8 F (36.6 C)  97.6 F (36.4 C)  TempSrc: Oral Oral  Oral  SpO2: 99% 99%  99%  Weight:   80.7 kg (177 lb 14.4 oz)   Height:        Intake/Output Summary (Last 24 hours) at 12/03/16 1322 Last data filed at 12/03/16 1200  Gross per 24 hour  Intake          1154.57 ml  Output             2075 ml  Net          -920.43 ml   Filed Weights   12/01/16 1646 12/02/16 0500 12/03/16 0623  Weight: 83.4 kg (183 lb 12.8 oz) 80.1 kg (176 lb 8 oz) 80.7 kg (177 lb 14.4 oz)    Examination:  General exam: Not in distress  Respiratory system: Clear bilaterally, respiratory effort normal, no wheezing or crackle Cardiovascular system: Regular rate rhythm S1 is normal. No pedal edema. Gastrointestinal system: Abdomen is nondistended, soft and nontender. Normal bowel sounds heard. Central nervous system: Alert and oriented. No focal neurological deficits. Extremities: Symmetric 5 x 5 power. Skin: No rashes, lesions or ulcers Psychiatry: Judgement and insight appear normal. Mood & affect appropriate.     Data Reviewed: I have personally reviewed following labs and imaging studies  CBC:  Recent Labs Lab 12/01/16 1347 12/03/16 0456  WBC 12.0* 9.9  HGB 13.8 14.0  HCT 39.0 39.7  MCV 85.2 85.2  PLT 270 181   Basic Metabolic Panel:  Recent  Labs Lab 12/01/16 1347 12/02/16 0543 12/03/16 0456  NA 138 139 139  K 3.5 3.2* 3.9  CL 105 100* 101  CO2 25 26 28   GLUCOSE 97 87 85  BUN 18 20 26*  CREATININE 1.40* 1.51* 1.65*  CALCIUM 8.9 8.6* 8.6*   GFR: Estimated Creatinine Clearance: 55.9 mL/min (A) (by C-G formula based on SCr of 1.65 mg/dL (H)). Liver  Function Tests:  Recent Labs Lab 12/03/16 0456  AST 53*  ALT 50  ALKPHOS 73  BILITOT 1.5*  PROT 6.6  ALBUMIN 3.2*   No results for input(s): LIPASE, AMYLASE in the last 168 hours. No results for input(s): AMMONIA in the last 168 hours. Coagulation Profile: No results for input(s): INR, PROTIME in the last 168 hours. Cardiac Enzymes:  Recent Labs Lab 12/01/16 1751 12/01/16 2358 12/02/16 0543  TROPONINI 0.50* 0.40* 0.30*   BNP (last 3 results) No results for input(s): PROBNP in the last 8760 hours. HbA1C: No results for input(s): HGBA1C in the last 72 hours. CBG: No results for input(s): GLUCAP in the last 168 hours. Lipid Profile: No results for input(s): CHOL, HDL, LDLCALC, TRIG, CHOLHDL, LDLDIRECT in the last 72 hours. Thyroid Function Tests: No results for input(s): TSH, T4TOTAL, FREET4, T3FREE, THYROIDAB in the last 72 hours. Anemia Panel: No results for input(s): VITAMINB12, FOLATE, FERRITIN, TIBC, IRON, RETICCTPCT in the last 72 hours. Sepsis Labs: No results for input(s): PROCALCITON, LATICACIDVEN in the last 168 hours.  Recent Results (from the past 240 hour(s))  MRSA PCR Screening     Status: Abnormal   Collection Time: 12/01/16  6:18 PM  Result Value Ref Range Status   MRSA by PCR POSITIVE (A) NEGATIVE Corrected    Comment:        The GeneXpert MRSA Assay (FDA approved for NASAL specimens only), is one component of a comprehensive MRSA colonization surveillance program. It is not intended to diagnose MRSA infection nor to guide or monitor treatment for MRSA infections. RESULT CALLED TO, READ BACK BY AND VERIFIED WITH: RN Charise Killian [id number] MLM CORRECTED ON 09/03 AT 0706: PREVIOUSLY REPORTED AS POSITIVE        The GeneXpert MRSA Assay (FDA approved for NASAL specimens only), is one component of a comprehensive MRSA colonization surveillance program. It is not intended to diagnose MRSA infection  nor to guide or monitor treatment for MRSA  infections. CRITICAL RESULT CALLED TO, READ BACK BY AND VERIFIED WITH: RN Charise Killian [id number] MLM          Radiology Studies: Dg Chest 2 View  Result Date: 12/01/2016 CLINICAL DATA:  Pt reports tightness across his abdomen, dry cough, and SOB x 2 days but worse today; pt reports hx of HTN and CHF. He reports he's been out of his HTN meds. Former smoker EXAM: CHEST  2 VIEW COMPARISON:  11/15/2014 FINDINGS: The rim density in the right mid lung has intervally cleared. A round density at the right lung base is almost nearly cleared, although there is some bandlike residual density in this vicinity which could be from scarring or atelectasis. Nevertheless, the significant improvement strongly argues against a neoplastic etiology. Mild enlargement of the cardiopericardial silhouette, with prominence of upper zone pulmonary vasculature but without overt edema. The right IJ line is no longer present. No pleural effusion. IMPRESSION: 1. Continued improvement, with resolution of the prior rounded density in the right mid lung, and prominent improvement of the rounded density at the right lung base. At the right lung  base there is some residual bandlike density in volume loss probably from scarring or atelectasis. 2. Mild enlargement of the cardiopericardial silhouette, with cephalization of blood flow suggesting pulmonary venous hypertension, but no overt edema. Electronically Signed   By: Gaylyn Rong M.D.   On: 12/01/2016 14:06        Scheduled Meds: . carvedilol  3.125 mg Oral BID WC  . Chlorhexidine Gluconate Cloth  6 each Topical Q0600  . folic acid  1 mg Oral Daily  . furosemide  60 mg Oral BID  . isosorbide-hydrALAZINE  2 tablet Oral TID  . multivitamin with minerals  1 tablet Oral Daily  . mupirocin ointment  1 application Nasal BID  . potassium chloride  40 mEq Oral BID  . sodium chloride flush  3 mL Intravenous Q12H  . spironolactone  12.5 mg Oral Daily  . thiamine  100 mg Oral  Daily  . warfarin  10 mg Oral ONCE-1800  . Warfarin - Pharmacist Dosing Inpatient   Does not apply q1800   Continuous Infusions: . sodium chloride    . heparin 1,300 Units/hr (12/03/16 0322)     LOS: 2 days    Lashya Passe Jaynie Collins, MD [facility] Pager [phone]  If 7PM-7AM, please contact night-coverage [email or web address] Password [email or web address] 12/03/2016, 1:22 PM

## 2016-12-03 NOTE — Progress Notes (Addendum)
ANTICOAGULATION CONSULT NOTE -follow up  Pharmacy Consult for heparin Indication: LV thrombus  Allergies  Allergen Reactions  . Celexa [Citalopram Hydrobromide] Other (See Comments)    DISORIENTED  . Lisinopril Cough   Patient Measurements: Height: 5\' 9"  (175.3 cm) Weight: 177 lb 14.4 oz (80.7 kg) (a scale) IBW/kg (Calculated) : 70.7  Vital Signs: Temp: 97.8 F (36.6 C) (09/04 0621) Temp Source: Oral (09/04 0621) BP: 118/88 (09/04 0621) Pulse Rate: 92 (09/04 0621)  Labs:  Recent Labs  12/01/16 1347 12/01/16 1751 12/01/16 2358 12/02/16 0543 12/02/16 2117 12/03/16 0456 12/03/16 0457  HGB 13.8  --   --   --   --  14.0  --   HCT 39.0  --   --   --   --  39.7  --   PLT 270  --   --   --   --  181  --   HEPARINUNFRC  --   --   --   --  0.45  --  0.46  CREATININE 1.40*  --   --  1.51*  --  1.65*  --   TROPONINI  --  0.50* 0.40* 0.30*  --   --   --    Estimated Creatinine Clearance: 55.9 mL/min (A) (by C-G formula based on SCr of 1.65 mg/dL (H)).  Medical History: Past Medical History:  Diagnosis Date  . Anxiety   . CHF (congestive heart failure) (HCC)   . Essential hypertension   . History of alcohol abuse    a. Sober since Feb 2016.  . 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 NICM (nonischemic cardiomyopathy) (HCC)    a. 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   Medications:  Prescriptions Prior to Admission  Medication Sig Dispense Refill Last Dose  . furosemide (LASIX) 20 MG tablet TAKE 2 TABLETS BY MOUTH AS NEEDED (Patient taking differently: TAKE 20 mg TABLETS BY MOUTH AS NEEDED) 60 tablet 0 12/01/2016 at Unknown time  . spironolactone (ALDACTONE) 25 MG tablet TAKE 1 TABLET BY MOUTH ONCE DAILY (Patient taking differently: TAKE 25 mg TABLET BY MOUTH  ONCE DAILY) 34 tablet 2 12/01/2016 at Unknown time  . carvedilol (COREG) 3.125 MG tablet TAKE 1 TABLET (3.125 MG TOTAL) BY MOUTH 2 (TWO) TIMES DAILY. (Patient not taking: Reported on 12/01/2016) 60 tablet 3 Not Taking at Unknown time  . isosorbide-hydrALAZINE (BIDIL) 20-37.5 MG tablet Take 2 tablets by mouth 3 (three) times daily. (Patient not taking: Reported on 12/01/2016) 180 tablet 3 Not Taking at Unknown time  . sacubitril-valsartan (ENTRESTO) 97-103 MG Take 1 tablet by mouth 2 (two) times daily. (Patient not taking: Reported on 12/01/2016) 60 tablet 11 Not Taking at Unknown time   Assessment: 89 YOM with h/o of LV mural thrombus that resolved on Coumadin now presents with acute on chronic systolic heart failure. A TTE on 9/3 revealed an LVEF of 10-15% with two apical thrombi in left ventricle. CBC stable; no bleeding documented; Pharmacy asked to start warfarin  Heparin level: 0.46  Goal of Therapy:  Heparin level 0.3-0.7 units/ml Monitor platelets by anticoagulation protocol: Yes   Plan:  - Warfarin 10mg  PO x1 tonight - Continue heparin gtt at 1300 units/hr - Monitor daily HL, CBC and s/s of bleeding   Ruben Im, PharmD Clinical Pharmacist 12/03/2016  8:15 AM

## 2016-12-03 NOTE — Progress Notes (Signed)
VT- 9 beats. MD aware monitor for now. Lab in the am

## 2016-12-04 LAB — BASIC METABOLIC PANEL
Anion gap: 9 (ref 5–15)
BUN: 23 mg/dL — AB (ref 6–20)
CO2: 29 mmol/L (ref 22–32)
CREATININE: 1.61 mg/dL — AB (ref 0.61–1.24)
Calcium: 9.3 mg/dL (ref 8.9–10.3)
Chloride: 100 mmol/L — ABNORMAL LOW (ref 101–111)
GFR calc Af Amer: 58 mL/min — ABNORMAL LOW (ref >60)
GFR calc non Af Amer: 50 mL/min — ABNORMAL LOW (ref >60)
GLUCOSE: 102 mg/dL — AB (ref 65–99)
POTASSIUM: 4.5 mmol/L (ref 3.5–5.1)
Sodium: 138 mmol/L (ref 135–145)

## 2016-12-04 LAB — CBC
HCT: 42.3 % (ref 39.0–52.0)
HEMOGLOBIN: 14.9 g/dL (ref 13.0–17.0)
MCH: 30.6 pg (ref 26.0–34.0)
MCHC: 35.2 g/dL (ref 30.0–36.0)
MCV: 86.9 fL (ref 78.0–100.0)
Platelets: 305 10*3/uL (ref 150–400)
RBC: 4.87 MIL/uL (ref 4.22–5.81)
RDW: 13.6 % (ref 11.5–15.5)
WBC: 11 10*3/uL — AB (ref 4.0–10.5)

## 2016-12-04 LAB — HEPARIN LEVEL (UNFRACTIONATED)
HEPARIN UNFRACTIONATED: 0.96 [IU]/mL — AB (ref 0.30–0.70)
HEPARIN UNFRACTIONATED: 0.6 [IU]/mL (ref 0.30–0.70)
Heparin Unfractionated: 0.67 IU/mL (ref 0.30–0.70)

## 2016-12-04 LAB — PROTIME-INR
INR: 1.05
PROTHROMBIN TIME: 13.6 s (ref 11.4–15.2)

## 2016-12-04 MED ORDER — WARFARIN SODIUM 10 MG PO TABS
10.0000 mg | ORAL_TABLET | Freq: Once | ORAL | Status: AC
  Administered 2016-12-04: 10 mg via ORAL
  Filled 2016-12-04: qty 1

## 2016-12-04 MED ORDER — POTASSIUM CHLORIDE CRYS ER 20 MEQ PO TBCR
40.0000 meq | EXTENDED_RELEASE_TABLET | Freq: Every day | ORAL | Status: DC
  Administered 2016-12-05: 40 meq via ORAL
  Filled 2016-12-04: qty 2

## 2016-12-04 MED ORDER — CARVEDILOL 6.25 MG PO TABS
6.2500 mg | ORAL_TABLET | Freq: Two times a day (BID) | ORAL | Status: DC
  Administered 2016-12-04 – 2016-12-05 (×2): 6.25 mg via ORAL
  Filled 2016-12-04 (×2): qty 1

## 2016-12-04 MED ORDER — COUMADIN BOOK
Freq: Once | Status: AC
Start: 2016-12-04 — End: 2016-12-04
  Administered 2016-12-04: 16:00:00
  Filled 2016-12-04 (×3): qty 1

## 2016-12-04 NOTE — Progress Notes (Signed)
PROGRESS NOTE    Robert Booth  ZOX:[id number] DOB: Sep 23, 1970 DOA: 12/01/2016 PCP: Dessa Phi, MD   Brief Narrative:  46 year old male with history of alcoholism, nonischemic cardiomyopathy with chronic systolic heart failure, hypertension, history of left ventricular mural thrombus presented with dyspnea on exertion and orthopnea. Patient with CHF exacerbation, elevated troponin. He was not taking his medications for last 3-4 months because of issue with Medicaid. Elevated BNP of 2326 on admission.  Assessment & Plan:  Acute on chronic combined systolic and diastolic heart failure/nonischemic cardiomyopathy:  -presented with DOE, orthopnea --Echocardiogram with EF of 10-15% and left ventricular thrombus. bnp 2326 with troponin peaked at 0.5, denies chest pain - he was treated with iv lasix initially, lasix switched to oral since 9/4 due to cr elevation, patient continue to make good urine  -. Continue BiDil, Coreg., spironolectone -Discontinue Entresto because of renal failure  -improving, cardiology input appreciated --Case manager to help with medications/insurance on discharge  Recurrent LV thrombus:  -With prior h/o lv thrombus ( resolved after anticoagulation in the past) -Started IV heparin since 9/3 , coumadin started on 9/4 -Pharmacy to dose coumadin   Uncontrolled hypertension on admission in the setting of medication noncompliance: uds negative Blood pressure improved. Continue current cardiac medications.  History of alcohol use. Continue supportive care. Reported not drinking for more than a year now. 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  Cr around 1 in 2017 Cr 1.4-1.5-1.65-1.6, new baseline? ua no infection, good urine output. Mild elevation in serum creatinine level likely hemodynamically mediated in the setting of diuretics.  Holding Long. Monitor BMP. Avoid nephrotoxins.   Hypokalemia: Replete potassium  chloride.  k 4.5 today, decrease potassium supplement from bid to daily Repeat bmp in am  Medication noncompliance: Case manager consulted  DVT prophylaxis: IV heparin/coumadin Code Status: Full code Family Communication: No family at bedside Disposition Plan: hopefully d/c soon with cardiology clearance.    Consultants:   Cardiology  Procedures: None Antimicrobials: None  Subjective: Seen and examined at bedside. Good urine output, on room air, denies chest pain, no cough, no edema  Objective: Vitals:   12/03/16 2229 12/04/16 0649 12/04/16 1743 12/04/16 2037  BP: 113/72 113/81 (!) 133/94 127/88  Pulse: 97 (!) 105 98 93  Resp: 15   18  Temp: 98.1 F (36.7 C) 97.7 F (36.5 C) 98 F (36.7 C) 97.8 F (36.6 C)  TempSrc: Oral Oral Oral Oral  SpO2: 96% 97% 98% 98%  Weight:  80.3 kg (177 lb)    Height:        Intake/Output Summary (Last 24 hours) at 12/04/16 2103 Last data filed at 12/04/16 2033  Gross per 24 hour  Intake              840 ml  Output             2250 ml  Net            -1410 ml   Filed Weights   12/02/16 0500 12/03/16 0623 12/04/16 0649  Weight: 80.1 kg (176 lb 8 oz) 80.7 kg (177 lb 14.4 oz) 80.3 kg (177 lb)    Examination:    General:  NAD  Cardiovascular: RRR  Respiratory: CTABL  Abdomen: Soft/ND/NT, positive BS  Musculoskeletal: No Edema  Neuro: alert, oriented      Data Reviewed: I have personally reviewed following labs and imaging studies  CBC:  Recent Labs Lab 12/01/16 1347  12/03/16 0456 12/04/16 0551  WBC 12.0* 9.9 11.0*  HGB 13.8 14.0 14.9  HCT 39.0 39.7 42.3  MCV 85.2 85.2 86.9  PLT 270 181 305   Basic Metabolic Panel:  Recent Labs Lab 12/01/16 1347 12/02/16 0543 12/03/16 0456 12/04/16 0551  NA 138 139 139 138  K 3.5 3.2* 3.9 4.5  CL 105 100* 101 100*  CO2 25 26 28 29   GLUCOSE 97 87 85 102*  BUN 18 20 26* 23*  CREATININE 1.40* 1.51* 1.65* 1.61*  CALCIUM 8.9 8.6* 8.6* 9.3   GFR: Estimated  Creatinine Clearance: 57.3 mL/min (A) (by C-G formula based on SCr of 1.61 mg/dL (H)). Liver Function Tests:  Recent Labs Lab 12/03/16 0456  AST 53*  ALT 50  ALKPHOS 73  BILITOT 1.5*  PROT 6.6  ALBUMIN 3.2*   No results for input(s): LIPASE, AMYLASE in the last 168 hours. No results for input(s): AMMONIA in the last 168 hours. Coagulation Profile:  Recent Labs Lab 12/04/16 1431  INR 1.05   Cardiac Enzymes:  Recent Labs Lab 12/01/16 1751 12/01/16 2358 12/02/16 0543  TROPONINI 0.50* 0.40* 0.30*   BNP (last 3 results) No results for input(s): PROBNP in the last 8760 hours. HbA1C: No results for input(s): HGBA1C in the last 72 hours. CBG: No results for input(s): GLUCAP in the last 168 hours. Lipid Profile: No results for input(s): CHOL, HDL, LDLCALC, TRIG, CHOLHDL, LDLDIRECT in the last 72 hours. Thyroid Function Tests: No results for input(s): TSH, T4TOTAL, FREET4, T3FREE, THYROIDAB in the last 72 hours. Anemia Panel: No results for input(s): VITAMINB12, FOLATE, FERRITIN, TIBC, IRON, RETICCTPCT in the last 72 hours. Sepsis Labs: No results for input(s): PROCALCITON, LATICACIDVEN in the last 168 hours.  Recent Results (from the past 240 hour(s))  MRSA PCR Screening     Status: Abnormal   Collection Time: 12/01/16  6:18 PM  Result Value Ref Range Status   MRSA by PCR POSITIVE (A) NEGATIVE Corrected    Comment:        The GeneXpert MRSA Assay (FDA approved for NASAL specimens only), is one component of a comprehensive MRSA colonization surveillance program. It is not intended to diagnose MRSA infection nor to guide or monitor treatment for MRSA infections. RESULT CALLED TO, READ BACK BY AND VERIFIED WITH: RN Charise Killian [id number] MLM CORRECTED ON 09/03 AT 0706: PREVIOUSLY REPORTED AS POSITIVE        The GeneXpert MRSA Assay (FDA approved for NASAL specimens only), is one component of a comprehensive MRSA colonization surveillance program. It is not intended  to diagnose MRSA infection  nor to guide or monitor treatment for MRSA infections. CRITICAL RESULT CALLED TO, READ BACK BY AND VERIFIED WITH: RN Charise Killian [id number] [facility]          Radiology Studies: No results found.      Scheduled Meds: . carvedilol  6.25 mg Oral BID WC  . Chlorhexidine Gluconate Cloth  6 each Topical Q0600  . folic acid  1 mg Oral Daily  . furosemide  60 mg Oral BID  . isosorbide-hydrALAZINE  2 tablet Oral TID  . multivitamin with minerals  1 tablet Oral Daily  . mupirocin ointment  1 application Nasal BID  . potassium chloride  40 mEq Oral BID  . sodium chloride flush  3 mL Intravenous Q12H  . spironolactone  12.5 mg Oral Daily  . thiamine  100 mg Oral Daily  . Warfarin - Pharmacist Dosing Inpatient   Does not  apply q1800   Continuous Infusions: . sodium chloride    . heparin 1,100 Units/hr (12/04/16 0649)     LOS: 3 days   Time spent: 35 mins I have personally reviewed and interpreted on  12/04/2016 daily labs, tele strips, imagings as discussed above under date review session and assessment and plans.  I reviewed all nursing notes, pharmacy notes, consultant notes,  vitals, pertinent old records  I have discussed plan of care as described above with cardiology, RN , patient  on 12/04/2016      Albertine Grates, MD PhD [facility] Pager [phone]  If 7PM-7AM, please contact night-coverage [email or web address] Password [email or web address] 12/04/2016, 9:03 PM

## 2016-12-04 NOTE — Progress Notes (Signed)
ANTICOAGULATION CONSULT NOTE -follow up  Pharmacy Consult for heparin Indication: LV thrombus  Allergies  Allergen Reactions  . Celexa [Citalopram Hydrobromide] Other (See Comments)    DISORIENTED  . Lisinopril Cough   Patient Measurements: Height: 5\' 9"  (175.3 cm) Weight: 177 lb (80.3 kg) (a scale) IBW/kg (Calculated) : 70.7  Vital Signs: Temp: 97.7 F (36.5 C) (09/05 0649) Temp Source: Oral (09/05 0649) BP: 113/81 (09/05 0649) Pulse Rate: 105 (09/05 0649)  Labs:  Recent Labs  12/01/16 1751 12/01/16 2358 12/02/16 0543  12/03/16 0456 12/03/16 0457 12/04/16 0551 12/04/16 1431  HGB  --   --   --   --  14.0  --  14.9  --   HCT  --   --   --   --  39.7  --  42.3  --   PLT  --   --   --   --  181  --  305  --   LABPROT  --   --   --   --   --   --   --  13.6  INR  --   --   --   --   --   --   --  1.05  HEPARINUNFRC  --   --   --   < >  --  0.46 0.96* 0.67  CREATININE  --   --  1.51*  --  1.65*  --  1.61*  --   TROPONINI 0.50* 0.40* 0.30*  --   --   --   --   --   < > = values in this interval not displayed. Estimated Creatinine Clearance: 57.3 mL/min (A) (by C-G formula based on SCr of 1.61 mg/dL (H)).  Medical History: Past Medical History:  Diagnosis Date  . Anxiety   . CHF (congestive heart failure) (HCC)   . Essential hypertension   . History of alcohol abuse    a. Sober since Feb 2016.  . 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 NICM (nonischemic cardiomyopathy) (HCC)    a. 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   Medications:  Prescriptions Prior to Admission  Medication Sig Dispense Refill Last Dose  . furosemide (LASIX) 20 MG tablet TAKE 2 TABLETS BY MOUTH AS NEEDED (Patient taking differently: TAKE 20 mg TABLETS BY MOUTH AS NEEDED) 60  tablet 0 12/01/2016 at Unknown time  . spironolactone (ALDACTONE) 25 MG tablet TAKE 1 TABLET BY MOUTH ONCE DAILY (Patient taking differently: TAKE 25 mg TABLET BY MOUTH ONCE DAILY) 34 tablet 2 12/01/2016 at Unknown time  . carvedilol (COREG) 3.125 MG tablet TAKE 1 TABLET (3.125 MG TOTAL) BY MOUTH 2 (TWO) TIMES DAILY. (Patient not taking: Reported on 12/01/2016) 60 tablet 3 Not Taking at Unknown time  . isosorbide-hydrALAZINE (BIDIL) 20-37.5 MG tablet Take 2 tablets by mouth 3 (three) times daily. (Patient not taking: Reported on 12/01/2016) 180 tablet 3 Not Taking at Unknown time  . sacubitril-valsartan (ENTRESTO) 97-103 MG Take 1 tablet by mouth 2 (two) times daily. (Patient not taking: Reported on 12/01/2016) 60 tablet 11 Not Taking at Unknown time   Assessment: 36 YOM with h/o of LV mural thrombus that resolved on Coumadin now presents with acute on chronic systolic heart failure. A TTE on 9/3 revealed an LVEF of  10-15% with two apical thrombi in left ventricle. Pharmacy asked to start warfarin. CBC stable; no bleeding documented   Heparin level: 0.67 INR: 1.05  Goal of Therapy:  Heparin level 0.3-0.7 units/ml Monitor platelets by anticoagulation protocol: Yes   Plan:  - Warfarin 10mg  PO x1 tonight - Continue heparin gtt at 1100 units/hr - Monitor daily HL, CBC and s/s of bleeding   Ruben Im, PharmD Clinical Pharmacist 12/04/2016 3:11 PM

## 2016-12-04 NOTE — Progress Notes (Signed)
ANTICOAGULATION CONSULT NOTE   Pharmacy Consult for heparin Indication: LV thrombus  Allergies  Allergen Reactions  . Celexa [Citalopram Hydrobromide] Other (See Comments)    DISORIENTED  . Lisinopril Cough   Patient Measurements: Height: 5\' 9"  (175.3 cm) Weight: 177 lb 14.4 oz (80.7 kg) (a scale) IBW/kg (Calculated) : 70.7  Vital Signs: Temp: 98.1 F (36.7 C) (09/04 2229) Temp Source: Oral (09/04 2229) BP: 113/72 (09/04 2229) Pulse Rate: 97 (09/04 2229)  Labs:  Recent Labs  12/01/16 1347 12/01/16 1751 12/01/16 2358 12/02/16 0543 12/02/16 2117 12/03/16 0456 12/03/16 0457 12/04/16 0551  HGB 13.8  --   --   --   --  14.0  --  14.9  HCT 39.0  --   --   --   --  39.7  --  42.3  PLT 270  --   --   --   --  181  --  305  HEPARINUNFRC  --   --   --   --  0.45  --  0.46 0.96*  CREATININE 1.40*  --   --  1.51*  --  1.65*  --   --   TROPONINI  --  0.50* 0.40* 0.30*  --   --   --   --    Estimated Creatinine Clearance: 55.9 mL/min (A) (by C-G formula based on SCr of 1.65 mg/dL (H)).  Assessment: 46 yo male with LV thrombus for heparin  Goal of Therapy:  Heparin level 0.3-0.7 units/ml Monitor platelets by anticoagulation protocol: Yes   Plan:  Decrease Heparin 1100 units/hr Check heparin level in 8 hours.  Geannie Risen, PharmD, BCPS  12/04/2016 6:45 AM

## 2016-12-04 NOTE — Progress Notes (Signed)
CM talked to the patient about follow up medical care; patient stated that he was feeling better and thought that he was getting better. He does not like taking medication.  Patient stated that his Medicaid was cancelled but does not know why and was not approved for Disability and does not know why.Financial Counselor to see the patient prior to discharge.  CM talked to the patient at length about the importance of managing his health, following up with [facility] about Medicaid/ Disability and keeping his appointments. Patient stated that he is willing to go to the Heart Failure Clinic and do the things that he needs to do. Patient stated " this is a wake up call." Also pt will follow up at the [facility] and [facility] for [facility] Care; CM will continue to follow for DCP. Abelino Derrick [facility] [phone]

## 2016-12-04 NOTE — Progress Notes (Signed)
Heart Failure Navigator Consult Note  Presentation: Patient of Dr Gala Romney that has been lost to follow-up secondary to inability to contact him. Per Dr. Marlana Latus Rami is a 46 y.o. male with a history of nonischemic cardiomyopathy, previous LV mural thrombus that resolved on Coumadin, alcohol abuse in remission, and hypertension who is being seen today for the evaluation of acute on chronic systolic heart failure at the request of Dr. Melynda Ripple.  Past Medical History:  Diagnosis Date  . Anxiety   . CHF (congestive heart failure) (HCC)   . Essential hypertension   . History of alcohol abuse    a. Sober since Feb 2016.  . 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 NICM (nonischemic cardiomyopathy) (HCC)    a. 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    Social History   Social History  . Marital status: Single    Spouse name: N/A  . Number of children: 6  . Years of education: N/A   Occupational History  . Unemployed    Social History Main Topics  . Smoking status: Former Smoker    Types: Cigarettes  . Smokeless tobacco: Former Neurosurgeon    Types: Chew     Comment: Was a rare smoker  . Alcohol use No  . Drug use: No  . Sexual activity: Yes   Other Topics Concern  . None   Social History Narrative   Lives with mom.      ECHO:Study Conclusions  - Left ventricle: The cavity size was moderately dilated. Wall   thickness was normal. Systolic function was severely reduced. The   estimated ejection fraction was in the range of 10% to 15%.   Severe diffuse hypokinesis with no identifiable regional   variations. Doppler parameters are consistent with restrictive   physiology, indicative of decreased left ventricular diastolic   compliance and/or increased left  atrial pressure. No evidence of   thrombus. There were 2 apical thrombi, each measuring   approximately 10 x 8 mm, partly mobile. - Aortic valve: Valve area (VTI): 1.31 cm^2. Valve area (Vmax):   1.72 cm^2. Valve area (Vmean): 1.5 cm^2. - Left atrium: The atrium was mildly dilated. - Right ventricle: The cavity size was mildly dilated. Systolic   function was mildly reduced. - Right atrium: The atrium was mildly dilated.  Impressions:  - LV function has deteriorated and new LV thrombi are seen.  ------------------------------------------------------------------- Study data:  Comparison was made to the study of 11/30/2015.  Study status:  Routine.  Procedure:  The patient reported no pain pre or post test. Transthoracic echocardiography. Image quality was adequate. Intravenous contrast (Definity) was administered.  Study completion:  There were no complications.          Transthoracic echocardiography.  M-mode, complete 2D, spectral Doppler, and color Doppler.  Birthdate:  Patient birthdate: 03/06/1971.  Age:  Patient is 46 yr old.  Sex:  Gender: male.    BMI: 26.1 kg/m^2.  Blood pressure:     116/96  Patient status:  Inpatient.  Study date: Study date: 12/02/2016. Study time: 08:08 AM.  Location:  Echo laboratory.   BNP    Component Value Date/Time   BNP 2,326.1 (H) 12/01/2016 1347    ProBNP No results found for: PROBNP   Education Assessment and Provision:  Detailed  education and instructions provided on heart failure disease management including the following:  Signs and symptoms of Heart Failure When to call the physician Importance of daily weights Low sodium diet Fluid restriction Medication management Anticipated future follow-up appointments  Patient education given on each of the above topics.  Patient acknowledges understanding and acceptance of all instructions.  I spoke with Mr. Whittaker regarding this hospitalization and HF diagnosis.  He is known to me  from prior hospitalizations as well as AHF Clinic visits.  He admits that he was denied for disability and has had no way to pay for medications--therefore was only taking Lasix and Spironolactone prior to admission.  He also says that it will be an issue going forward.  He has a scale at home yet has not been weighing.  I reviewed the importance of daily weights and when to contact the physician.  I reviewed a low sodium diet and high sodium foods to avoid.  He says that he has "no issue" with avoiding sodium in his diet.  He plans to follow-up in the AHF Clinic after discharge and has an appt scheduled for Sept 13th.  I will refer him to HF Clinic SW for assitance with ongoing financial challenges.  Education Materials:  "Living Better With Heart Failure" Booklet, Daily Weight Tracker Tool    High Risk Criteria for Readmission and/or Poor Patient Outcomes:  (Recommend Follow-up with Advanced Heart Failure Clinic)--yes   EF <30%- yes 10-15%  2 or more admissions in 6 months-1 / 28mo  Difficult social situation- Yes -no insurance noted  Demonstrates medication noncompliance- Yes secondary to finances   Barriers of Care:  Financial, Knowledge and compliance  Discharge Planning:   Plans to return to home with sister and niece.  He will benefit from and I will make referral to HF [facility].  He will also need medication assistance through HF Fund at time of discharge.

## 2016-12-04 NOTE — Progress Notes (Signed)
ANTICOAGULATION CONSULT NOTE -follow up  Pharmacy Consult for heparin Indication: LV thrombus  Allergies  Allergen Reactions  . Celexa [Citalopram Hydrobromide] Other (See Comments)    DISORIENTED  . Lisinopril Cough   Patient Measurements: Height: 5\' 9"  (175.3 cm) Weight: 177 lb (80.3 kg) (a scale) IBW/kg (Calculated) : 70.7  Vital Signs: Temp: 97.8 F (36.6 C) (09/05 2037) Temp Source: Oral (09/05 2037) BP: 127/88 (09/05 2037) Pulse Rate: 93 (09/05 2037)  Labs:  Recent Labs  12/01/16 2358 12/02/16 0543  12/03/16 0456  12/04/16 0551 12/04/16 1431 12/04/16 2202  HGB  --   --   --  14.0  --  14.9  --   --   HCT  --   --   --  39.7  --  42.3  --   --   PLT  --   --   --  181  --  305  --   --   LABPROT  --   --   --   --   --   --  13.6  --   INR  --   --   --   --   --   --  1.05  --   HEPARINUNFRC  --   --   < >  --   < > 0.96* 0.67 0.60  CREATININE  --  1.51*  --  1.65*  --  1.61*  --   --   TROPONINI 0.40* 0.30*  --   --   --   --   --   --   < > = values in this interval not displayed. Estimated Creatinine Clearance: 57.3 mL/min (A) (by C-G formula based on SCr of 1.61 mg/dL (H)).  Medical History: Past Medical History:  Diagnosis Date  . Anxiety   . CHF (congestive heart failure) (HCC)   . Essential hypertension   . History of alcohol abuse    a. Sober since Feb 2016.  . 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 NICM (nonischemic cardiomyopathy) (HCC)    a. 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   Medications:  Prescriptions Prior to Admission  Medication Sig Dispense Refill Last Dose  . furosemide (LASIX) 20 MG tablet TAKE 2 TABLETS BY MOUTH AS NEEDED (Patient taking differently: TAKE 20 mg TABLETS BY MOUTH AS NEEDED) 60 tablet 0 12/01/2016 at  Unknown time  . spironolactone (ALDACTONE) 25 MG tablet TAKE 1 TABLET BY MOUTH ONCE DAILY (Patient taking differently: TAKE 25 mg TABLET BY MOUTH ONCE DAILY) 34 tablet 2 12/01/2016 at Unknown time  . carvedilol (COREG) 3.125 MG tablet TAKE 1 TABLET (3.125 MG TOTAL) BY MOUTH 2 (TWO) TIMES DAILY. (Patient not taking: Reported on 12/01/2016) 60 tablet 3 Not Taking at Unknown time  . isosorbide-hydrALAZINE (BIDIL) 20-37.5 MG tablet Take 2 tablets by mouth 3 (three) times daily. (Patient not taking: Reported on 12/01/2016) 180 tablet 3 Not Taking at Unknown time  . sacubitril-valsartan (ENTRESTO) 97-103 MG Take 1 tablet by mouth 2 (two) times daily. (Patient not taking: Reported on 12/01/2016) 60 tablet 11 Not Taking at Unknown time   Assessment: 38 YOM with h/o of LV mural thrombus that resolved on Coumadin now presents with acute on chronic systolic heart failure. A TTE on 9/3 revealed an LVEF  of 10-15% with two apical thrombi in left ventricle.   Pharmacy consulted for heparin/warfarin. CBC stable and no s/s bleeding noted.   Heparin level: 0.6 INR: 1.05 (from 9/5)  Goal of Therapy:  Heparin level 0.3-0.7 units/ml Monitor platelets by anticoagulation protocol: Yes   Plan:  Continue heparin gtt at 1100 units/hr Confirm heparin level in AM Heparin level, INR, and CBC daily Monitor for s/s bleeding  York Cerise, PharmD Clinical Pharmacist 12/04/16 11:10 PM

## 2016-12-04 NOTE — Progress Notes (Signed)
Progress Note  Patient Name: Robert Booth Date of Encounter: 12/04/2016  Primary Cardiologist: Dr. Gala Romney  Subjective  Intermittent dyspnea with laying down. No chest pain.   Inpatient Medications    Scheduled Meds: . carvedilol  3.125 mg Oral BID WC  . Chlorhexidine Gluconate Cloth  6 each Topical Q0600  . coumadin book   Does not apply Once  . folic acid  1 mg Oral Daily  . furosemide  60 mg Oral BID  . isosorbide-hydrALAZINE  2 tablet Oral TID  . multivitamin with minerals  1 tablet Oral Daily  . mupirocin ointment  1 application Nasal BID  . potassium chloride  40 mEq Oral BID  . sodium chloride flush  3 mL Intravenous Q12H  . spironolactone  12.5 mg Oral Daily  . thiamine  100 mg Oral Daily  . Warfarin - Pharmacist Dosing Inpatient   Does not apply q1800   Continuous Infusions: . sodium chloride    . heparin 1,100 Units/hr (12/04/16 0649)   PRN Meds: sodium chloride, acetaminophen, LORazepam **OR** LORazepam, ondansetron (ZOFRAN) IV, sodium chloride flush   Vital Signs    Vitals:   12/03/16 1326 12/03/16 1619 12/03/16 2229 12/04/16 0649  BP: 109/80 106/82 113/72 113/81  Pulse: 98 99 97 (!) 105  Resp: 14 12 15    Temp:  98.3 F (36.8 C) 98.1 F (36.7 C) 97.7 F (36.5 C)  TempSrc:  Oral Oral Oral  SpO2: 98% 100% 96% 97%  Weight:    177 lb (80.3 kg)  Height:        Intake/Output Summary (Last 24 hours) at 12/04/16 1027 Last data filed at 12/04/16 0946  Gross per 24 hour  Intake              960 ml  Output             1900 ml  Net             -940 ml   Filed Weights   12/02/16 0500 12/03/16 0623 12/04/16 0649  Weight: 176 lb 8 oz (80.1 kg) 177 lb 14.4 oz (80.7 kg) 177 lb (80.3 kg)    Telemetry    Sinus rhythm at rate of 90s, wide complex tachycardia x 6 beats  - Personally Reviewed  ECG    None today  Physical Exam   GEN: No acute distress.   Neck: No JVD Cardiac: RRR, no murmurs, rubs, or gallops.  Respiratory: Clear to auscultation  bilaterally. GI: Soft, nontender, non-distended  MS: No edema; No deformity. Neuro:  Nonfocal  Psych: Normal affect   Labs    Chemistry Recent Labs Lab 12/02/16 0543 12/03/16 0456 12/04/16 0551  NA 139 139 138  K 3.2* 3.9 4.5  CL 100* 101 100*  CO2 26 28 29   GLUCOSE 87 85 102*  BUN 20 26* 23*  CREATININE 1.51* 1.65* 1.61*  CALCIUM 8.6* 8.6* 9.3  PROT  --  6.6  --   ALBUMIN  --  3.2*  --   AST  --  53*  --   ALT  --  50  --   ALKPHOS  --  73  --   BILITOT  --  1.5*  --   GFRNONAA 54* 48* 50*  GFRAA >60 56* 58*  ANIONGAP 13 10 9      Hematology Recent Labs Lab 12/01/16 1347 12/03/16 0456 12/04/16 0551  WBC 12.0* 9.9 11.0*  RBC 4.58 4.66 4.87  HGB 13.8 14.0 14.9  HCT  39.0 39.7 42.3  MCV 85.2 85.2 86.9  MCH 30.1 30.0 30.6  MCHC 35.4 35.3 35.2  RDW 13.2 13.3 13.6  PLT 270 181 305    Cardiac Enzymes Recent Labs Lab 12/01/16 1751 12/01/16 2358 12/02/16 0543  TROPONINI 0.50* 0.40* 0.30*    Recent Labs Lab 12/01/16 1412  TROPIPOC 0.42*     BNP Recent Labs Lab 12/01/16 1347  BNP 2,326.1*     DDimer No results for input(s): DDIMER in the last 168 hours.   Radiology    No results found.  Cardiac Studies   ECHO 12/02/16  Study Conclusions  - Left ventricle: The cavity size was moderately dilated. Wall thickness was normal. Systolic function was severely reduced. The estimated ejection fraction was in the range of 10% to 15%. Severe diffuse hypokinesis with no identifiable regional variations. Doppler parameters are consistent with restrictive physiology, indicative of decreased left ventricular diastolic compliance and/or increased left atrial pressure. No evidence of thrombus. There were 2 apical thrombi, each measuring approximately 10 x 8 mm, partly mobile. - Aortic valve: Valve area (VTI): 1.31 cm^2. Valve area (Vmax): 1.72 cm^2. Valve area (Vmean): 1.5 cm^2. - Left atrium: The atrium was mildly dilated. - Right  ventricle: The cavity size was mildly dilated. Systolic function was mildly reduced. - Right atrium: The atrium was mildly dilated.  Impressions:  - LV function has deteriorated and new LV thrombi are seen.  Patient Profile        46 y.o. male with a history of nonischemic cardiomyopathy, previous LV mural thrombus that resolved on Coumadin, alcohol abuse in remission, and hypertensionwho is being seen for the evaluation of acute on chronic systolic heart failureat the request of Dr. Melynda Ripple.  Assessment & Plan    1. Acute on chronic systolic CHF - EF reduced again to 10-15% in setting of medication non compliance due to cost. Net I & O negative 2.3L. Weight down 6 lb (183-->177lb). Euvolemic on exam. - continue current regimen. Up-titrate [place] BP allows.   2. LV thrombus - On warfarin with heparin bridge per pharmacy.   3. Elevated troponin - Flat trend. Likely demand in setting of acute CHF. Consider outpatient evaluation.   4. Hypertensive heart disease - BP stable   5. AKI with presume CKD stage III - Scr was normal in 2017. Minimally improved overnight. Follow with diuresis  Dispo: Follow up with Dr. Gala Romney 9/13. Will need to set up coumadin clinic follow up @ [address] at discharge.  -  Signed, Manson Passey, PA  12/04/2016, 10:27 AM    History and all data above reviewed.  Patient examined.  I agree with the findings as above.  Breathing is better.  Not back to baseline. The patient exam reveals COR:RRR  ,  Lungs: Clear  ,  Abd: Positive bowel sounds, no rebound no guarding, Ext No edema  .  All available labs, radiology testing, previous records reviewed. Agree with documented assessment and plan. CHF:  I will increase the Coreg slightly.  APICAL THROMBUS:  I do not think that the INR needs to be therapeutic at the time of discharge.  However, we would need to make sure that he has adequate education and follow up to get his warfarin regulated.    Ala Kratz  12:12 PM  12/04/2016

## 2016-12-05 LAB — CBC
HEMATOCRIT: 40.7 % (ref 39.0–52.0)
HEMOGLOBIN: 13.5 g/dL (ref 13.0–17.0)
MCH: 28.9 pg (ref 26.0–34.0)
MCHC: 33.2 g/dL (ref 30.0–36.0)
MCV: 87.2 fL (ref 78.0–100.0)
Platelets: 282 10*3/uL (ref 150–400)
RBC: 4.67 MIL/uL (ref 4.22–5.81)
RDW: 13.8 % (ref 11.5–15.5)
WBC: 7.7 10*3/uL (ref 4.0–10.5)

## 2016-12-05 LAB — HEPARIN LEVEL (UNFRACTIONATED): Heparin Unfractionated: 0.76 IU/mL — ABNORMAL HIGH (ref 0.30–0.70)

## 2016-12-05 LAB — PROTIME-INR
INR: 1.09
Prothrombin Time: 14.1 seconds (ref 11.4–15.2)

## 2016-12-05 LAB — BASIC METABOLIC PANEL
ANION GAP: 11 (ref 5–15)
BUN: 26 mg/dL — ABNORMAL HIGH (ref 6–20)
CALCIUM: 9.7 mg/dL (ref 8.9–10.3)
CO2: 29 mmol/L (ref 22–32)
Chloride: 97 mmol/L — ABNORMAL LOW (ref 101–111)
Creatinine, Ser: 1.47 mg/dL — ABNORMAL HIGH (ref 0.61–1.24)
GFR calc Af Amer: >60 mL/min (ref >60)
GFR calc non Af Amer: 56 mL/min — ABNORMAL LOW (ref >60)
GLUCOSE: 91 mg/dL (ref 65–99)
POTASSIUM: 5 mmol/L (ref 3.5–5.1)
Sodium: 137 mmol/L (ref 135–145)

## 2016-12-05 MED ORDER — WARFARIN SODIUM 7.5 MG PO TABS: ORAL_TABLET | ORAL | 0 refills | Status: DC

## 2016-12-05 MED ORDER — ISOSORB DINITRATE-HYDRALAZINE 20-37.5 MG PO TABS: 2.0000 | ORAL_TABLET | Freq: Three times a day (TID) | ORAL | 0 refills | Status: DC

## 2016-12-05 MED ORDER — FUROSEMIDE 20 MG PO TABS: 60.0000 mg | ORAL_TABLET | Freq: Two times a day (BID) | ORAL | 0 refills | Status: DC

## 2016-12-05 MED ORDER — ENOXAPARIN SODIUM 80 MG/0.8ML ~~LOC~~ SOLN: 80.0000 mg | Freq: Two times a day (BID) | SUBCUTANEOUS | 0 refills | Status: DC

## 2016-12-05 MED ORDER — ADULT MULTIVITAMIN W/MINERALS CH: 1.0000 | ORAL_TABLET | Freq: Every day | ORAL | 0 refills | Status: DC

## 2016-12-05 MED ORDER — WARFARIN SODIUM 2.5 MG PO TABS: 12.5000 mg | ORAL_TABLET | Freq: Once | ORAL | Status: DC

## 2016-12-05 MED ORDER — ENOXAPARIN SODIUM 80 MG/0.8ML ~~LOC~~ SOLN
80.0000 mg | Freq: Two times a day (BID) | SUBCUTANEOUS | Status: DC
  Administered 2016-12-05: 80 mg via SUBCUTANEOUS
  Filled 2016-12-05: qty 0.8

## 2016-12-05 MED ORDER — THIAMINE HCL 100 MG PO TABS: 100.0000 mg | ORAL_TABLET | Freq: Every day | ORAL | 0 refills | Status: DC

## 2016-12-05 MED ORDER — FOLIC ACID 1 MG PO TABS: 1.0000 mg | ORAL_TABLET | Freq: Every day | ORAL | 0 refills | Status: DC

## 2016-12-05 MED ORDER — CARVEDILOL 6.25 MG PO TABS: 6.2500 mg | ORAL_TABLET | Freq: Two times a day (BID) | ORAL | 0 refills | Status: DC

## 2016-12-05 MED FILL — FOLIC ACID 1 MG TABLET: 1 | 30 days supply | Qty: 30 | Fill #0

## 2016-12-05 MED FILL — ISOSORBIDE DN 20 MG TABLET: 20 | 30 days supply | Qty: 180 | Fill #0

## 2016-12-05 MED FILL — ENOXAPARIN 80 MG/0.8 ML SYR: 80 | 5 days supply | Qty: 8 | Fill #0

## 2016-12-05 MED FILL — WARFARIN SODIUM 7.5 MG TAB: 7.5 | 30 days supply | Qty: 45 | Fill #0

## 2016-12-05 MED FILL — FUROSEMIDE 20 MG TABLET: 20 | 10 days supply | Qty: 60 | Fill #0

## 2016-12-05 MED FILL — CARVEDILOL 6.25 MG TABLET: 6.25 | 30 days supply | Qty: 60 | Fill #0

## 2016-12-05 MED FILL — hydrALAZINE HCL 50 MG TABS: 50 | 30 days supply | Qty: 135 | Fill #0

## 2016-12-05 NOTE — Progress Notes (Signed)
ANTICOAGULATION CONSULT NOTE -follow up  Pharmacy Consult for heparin Indication: LV thrombus  Allergies  Allergen Reactions  . Celexa [Citalopram Hydrobromide] Other (See Comments)    DISORIENTED  . Lisinopril Cough   Patient Measurements: Height:  (175.3 cm) Weight: 177 lb (80.3 kg) (scale a) IBW/kg (Calculated) : 70.7  Vital Signs: Temp: 97.7 F (36.5 C) (09/06 0923) Temp Source: Oral (09/06 0923) BP: 123/88 (09/06 0923) Pulse Rate: 99 (09/06 0923)  Labs:  Recent Labs  12/03/16 0456  12/04/16 0551 12/04/16 1431 12/04/16 2202 12/05/16 0543  HGB 14.0  --  14.9  --   --  13.5  HCT 39.7  --  42.3  --   --  40.7  PLT 181  --  305  --   --  282  LABPROT  --   --   --  13.6  --  14.1  INR  --   --   --  1.05  --  1.09  HEPARINUNFRC  --   < > 0.96* 0.67 0.60 0.76*  CREATININE 1.65*  --  1.61*  --   --  1.47*  < > = values in this interval not displayed. Estimated Creatinine Clearance: 62.8 mL/min (A) (by C-G formula based on SCr of 1.47 mg/dL (H)).  Medical History: Past Medical History:  Diagnosis Date  . Anxiety   . CHF (congestive heart failure) (HCC)   . Essential hypertension   . History of alcohol abuse    a. Sober since Feb 2016.  . 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 NICM (nonischemic cardiomyopathy) (HCC)    a. 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   Medications:  Prescriptions Prior to Admission  Medication Sig Dispense Refill Last Dose  . furosemide (LASIX) 20 MG tablet TAKE 2 TABLETS BY MOUTH AS NEEDED (Patient taking differently: TAKE 20 mg TABLETS BY MOUTH AS NEEDED) 60 tablet 0 12/01/2016 at Unknown time  . spironolactone (ALDACTONE) 25 MG tablet TAKE 1 TABLET BY MOUTH ONCE DAILY (Patient taking differently: TAKE 25 mg TABLET BY  MOUTH ONCE DAILY) 34 tablet 2 12/01/2016 at Unknown time  . carvedilol (COREG) 3.125 MG tablet TAKE 1 TABLET (3.125 MG TOTAL) BY MOUTH 2 (TWO) TIMES DAILY. (Patient not taking: Reported on 12/01/2016) 60 tablet 3 Not Taking at Unknown time  . isosorbide-hydrALAZINE (BIDIL) 20-37.5 MG tablet Take 2 tablets by mouth 3 (three) times daily. (Patient not taking: Reported on 12/01/2016) 180 tablet 3 Not Taking at Unknown time  . sacubitril-valsartan (ENTRESTO) 97-103 MG Take 1 tablet by mouth 2 (two) times daily. (Patient not taking: Reported on 12/01/2016) 60 tablet 11 Not Taking at Unknown time   Assessment: 7 YOM with h/o of LV mural thrombus that resolved on Coumadin now presents with acute on chronic systolic heart failure. A TTE on 9/3 revealed an LVEF of 10-15% with two apical thrombi in left ventricle. Pharmacy asked to start warfarin.    Heparin level: 0.76 - RN reports no bleeding INR: 1.09  Warfarin regimen in 2016: alternating 11.25 M,W,F with 7.5mg  all other days for total of 63.75mg  weekly  Goal of Therapy:  Heparin level 0.3-0.7 units/ml Monitor platelets by anticoagulation protocol: Yes   Plan:  - Warfarin 12.5mg  PO x1 tonight - Reduce heparin gtt to 950 units/hr - Monitor  daily HL, CBC and s/s of bleeding   Ruben Im, PharmD Clinical Pharmacist 12/05/2016 9:50 AM

## 2016-12-05 NOTE — Discharge Summary (Signed)
Discharge Summary  Robert Booth ZOX:[id number] DOB: 1970/12/08  PCP: Dessa Phi, MD  Admit date: 12/01/2016 Discharge date: 12/05/2016  Time spent: >58mins, more than 50% time spent on coordination of care  Recommendations for Outpatient Follow-up:  1. F/u with PMD within a week  for hospital discharge follow up, repeat cbc/bmp at follow up 2. F/u with coumadin clinic on 9/7 3. F/u with cardiology on 9/13  Discharge Diagnoses:  Active Hospital Problems   Diagnosis Date Noted  . Acute on chronic combined systolic and diastolic CHF, NYHA class 3 (HCC) 12/01/2016  . Alcoholism (HCC) 12/01/2016  . Uncontrolled hypertension 12/01/2016  . NICM (nonischemic cardiomyopathy) (HCC) 12/01/2016  . Elevated troponin 12/01/2016    Resolved Hospital Problems   Diagnosis Date Noted Date Resolved  No resolved problems to display.    Discharge Condition: stable  Diet recommendation: heart healthy  Filed Weights   12/03/16 0623 12/04/16 0649 12/05/16 0336  Weight: 80.7 kg (177 lb 14.4 oz) 80.3 kg (177 lb) 80.3 kg (177 lb)    History of present illness:  PCP: Dessa Phi, MD   Attending physician: Melynda Ripple  Patient coming from/Resides with: Private residence  Chief Complaint: Dyspnea on exertion and orthopnea  HPI: Robert Booth is a 46 y.o. male with medical history significant for prior alcoholism, nonischemic cardiomyopathy with chronic systolic heart failure, hypertension and history of LV mural thrombus. Patient reports that in the past few months he recently discovered he was no longer Medicaid eligible and has repeatedly attempted to obtain SSI disability and has been denied. Because of this he has been unable to obtain his prescriptions. During the past 3-4 months he has attempted to reestablish his Medicaid but has been unsuccessful. Because of this he is only been able to afford his Lasix and Aldactone and therefore has not taken his other cardiac medications as  prescribed. He has previously been established with the heart failure clinic but due to lack of insurance he has been unable to follow up. He was last evaluated by healthcare provider at the [facility] in February of this year. Upon presentation to the ER patient was complaining of dyspnea on exertion, orthopnea and increased abdominal swelling without lower extremity swelling. He had an elevated BNP of 2326 and elevated poc troponin of 0.42. Chest x-ray consistent with pulmonary venous hypertension without overt edema. No documentation as to whether patient was hypoxemic at presentation. Patient will be admitted for IV Lasix regarding suspected acute heart failure exacerbation in setting of nonadherence to medications due to inability to procure.  ED Course:  Vital Signs: BP (!) 150/113   Pulse (!) 114   Temp (!) 97.4 F (36.3 C) (Oral)   Resp (!) 21   SpO2 99%  2 view CXR: As above Lab data: Sodium 138, potassium 3.5, chloride 105, CO2 25, glucose 97, BUN 19, creatinine 1.4, anion gap 8, BNP 2326, poc troponin 0.42, white count 12,000 differential not obtained, hemoglobin 13.8, platelets 270,000 Medications and treatments: Zofran 4 mg IV 1, Lasix 60 mg IV 1  Hospital Course:  Principal Problem:   Acute on chronic combined systolic and diastolic CHF, NYHA class 3 (HCC) Active Problems:   Alcoholism (HCC)   Uncontrolled hypertension   NICM (nonischemic cardiomyopathy) (HCC)   Elevated troponin   Acute on chronic combined systolic and diastolic heart failure/nonischemic cardiomyopathy:  -presented with DOE, orthopnea --Echocardiogram with EF of 10-15% and left ventricular thrombus. bnp 2326 with troponin peaked at 0.5, denies chest  pain - he was treated with iv lasix initially, lasix switched to oral since 9/4 due to cr elevation, patient continue to make good urine  - Continue BiDil ( imdur 40mg  tid/hydralazine 75mg  tid for affordability), Coreg.,  spironolectone -Discontinue Entresto because of renal failure  -improving, cardiology input appreciated, he is cleared to discharge home with close cardiology follow up. --Case manager to help with medications/insurance on discharge  Recurrent LV thrombus:  -With prior h/o lv thrombus ( resolved after anticoagulation in the past) -Started IV heparin since 9/3 , coumadin started on 9/4 -Pharmacy to dose coumadin,  -he is cleared to discharge home by cardiology , he is discharged on coumadin/lovenox bridge    Uncontrolled hypertension on admission in the setting of medication noncompliance: uds negative Blood pressure improved. Continue current cardiac medications.  History of alcohol use. Continue supportive care. Reported not drinking for more than a year now. 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  Cr around 1 in 2017 Cr 1.4-1.5-1.65-1.6-1.47, new baseline? ua no infection, good urine output. Mild elevation in serum creatinine level likely hemodynamically mediated in the setting of diuretics and heart failure.  Holding Ailey. Monitor BMP. Avoid nephrotoxins.   Hypokalemia: Replete potassium chloride replaced k wnl at discharge, he is to have repeat bmp at hospital follow up.   Medication noncompliance: Case Chief Strategy Officer letter provided  DVT prophylaxis: IV heparin/coumadin Code Status: Full code Family Communication: No family at bedside Disposition Plan: d/c home with cardiology clearance.    Consultants:   Cardiology  Procedures: None Antimicrobials: None   Discharge Exam: BP 123/88 (BP Location: Right Arm)   Pulse 99   Temp 97.7 F (36.5 C) (Oral)   Resp 18   Ht 5\' 9"  (1.753 m)   Wt 80.3 kg (177 lb) Comment: scale a  SpO2 98%   BMI 26.14 kg/m    General:  NAD  Cardiovascular: RRR  Respiratory: CTABL  Abdomen: Soft/ND/NT, positive BS  Musculoskeletal: No Edema  Neuro: alert,  oriented    Discharge Instructions You were cared for by a hospitalist during your hospital stay. If you have any questions about your discharge medications or the care you received while you were in the hospital after you are discharged, you can call the unit and asked to speak with the hospitalist on call if the hospitalist that took care of you is not available. Once you are discharged, your primary care physician will handle any further medical issues. Please note that NO REFILLS for any discharge medications will be authorized once you are discharged, as it is imperative that you return to your primary care physician (or establish a relationship with a primary care physician if you do not have one) for your aftercare needs so that they can reassess your need for medications and monitor your lab values.  Discharge Instructions    Diet - low sodium heart healthy    Complete by:  As directed    Increase activity slowly    Complete by:  As directed      Allergies as of 12/05/2016      Reactions   Celexa [citalopram Hydrobromide] Other (See Comments)   DISORIENTED   Lisinopril Cough      Medication List    STOP taking these medications   sacubitril-valsartan 97-103 MG Commonly known as:  ENTRESTO     TAKE these medications   carvedilol 6.25 MG tablet Commonly known as:  COREG Take  1 tablet (6.25 mg total) by mouth 2 (two) times daily with a meal. What changed:  medication strength  how much to take  how to take this  when to take this  additional instructions   enoxaparin 80 MG/0.8ML injection Commonly known as:  LOVENOX Inject 0.8 mLs (80 mg total) into the skin 2 (two) times daily.   folic acid 1 MG tablet Commonly known as:  FOLVITE Take 1 tablet (1 mg total) by mouth daily.   furosemide 20 MG tablet Commonly known as:  LASIX Take 3 tablets (60 mg total) by mouth 2 (two) times daily. What changed:  See the new instructions.   isosorbide-hydrALAZINE 20-37.5 MG  tablet Commonly known as:  BIDIL Take 2 tablets by mouth 3 (three) times daily.   multivitamin with minerals Tabs tablet Take 1 tablet by mouth daily.   spironolactone 25 MG tablet Commonly known as:  ALDACTONE TAKE 1 TABLET BY MOUTH ONCE DAILY What changed:  See the new instructions.   thiamine 100 MG tablet Take 1 tablet (100 mg total) by mouth daily.   warfarin 7.5 MG tablet Commonly known as:  COUMADIN Take 11.25mg  (one and half tab) on MWF, take 7.5mg  ( one tab) on TTSS            Discharge Care Instructions        Start     Ordered   12/06/16 0000  folic acid (FOLVITE) 1 MG tablet  Daily     12/05/16 1215   12/06/16 0000  Multiple Vitamin (MULTIVITAMIN WITH MINERALS) TABS tablet  Daily     12/05/16 1215   12/06/16 0000  thiamine 100 MG tablet  Daily     12/05/16 1215   12/05/16 0000  carvedilol (COREG) 6.25 MG tablet  2 times daily with meals     12/05/16 1205   12/05/16 0000  enoxaparin (LOVENOX) 80 MG/0.8ML injection  2 times daily     12/05/16 1215   12/05/16 0000  furosemide (LASIX) 20 MG tablet  2 times daily     12/05/16 1215   12/05/16 0000  isosorbide-hydrALAZINE (BIDIL) 20-37.5 MG tablet  3 times daily     12/05/16 1215   12/05/16 0000  warfarin (COUMADIN) 7.5 MG tablet     12/05/16 1215   12/05/16 0000  Increase activity slowly     12/05/16 1215   12/05/16 0000  Diet - low sodium heart healthy     12/05/16 1215     Allergies  Allergen Reactions  . Celexa [Citalopram Hydrobromide] Other (See Comments)    DISORIENTED  . Lisinopril Cough   Follow-up Information    Bensimhon, Bevelyn Buckles, MD. Go on 12/12/2016.   Specialty:  Cardiology Why:   Contact information: [address] [place] [address] [phone]        [facility] [employer]. Go on 12/06/2016.   Specialty:  Cardiology Why:  :30am for coumadin check  Contact information: [address] [place]  [address] [phone]       [facility] Follow up.   Contact information: [address] [place] [address] [phone]           The results of significant diagnostics from this hospitalization (including imaging, microbiology, ancillary and laboratory) are listed below for reference.    Significant Diagnostic Studies: Dg Chest 2 View  Result Date: 12/01/2016 CLINICAL DATA:  Pt reports tightness across  his abdomen, dry cough, and SOB x 2 days but worse today; pt reports hx of HTN and CHF. He reports he's been out of his HTN meds. Former smoker EXAM: CHEST  2 VIEW COMPARISON:  11/15/2014 FINDINGS: The rim density in the right mid lung has intervally cleared. A round density at the right lung base is almost nearly cleared, although there is some bandlike residual density in this vicinity which could be from scarring or atelectasis. Nevertheless, the significant improvement strongly argues against a neoplastic etiology. Mild enlargement of the cardiopericardial silhouette, with prominence of upper zone pulmonary vasculature but without overt edema. The right IJ line is no longer present. No pleural effusion. IMPRESSION: 1. Continued improvement, with resolution of the prior rounded density in the right mid lung, and prominent improvement of the rounded density at the right lung base. At the right lung base there is some residual bandlike density in volume loss probably from scarring or atelectasis. 2. Mild enlargement of the cardiopericardial silhouette, with cephalization of blood flow suggesting pulmonary venous hypertension, but no overt edema. Electronically Signed   By: Gaylyn Rong M.D.   On: 12/01/2016 14:06    Microbiology: Recent Results (from the past 240 hour(s))  MRSA PCR Screening     Status: Abnormal   Collection Time: 12/01/16  6:18 PM  Result Value Ref Range Status   MRSA by PCR POSITIVE (A) NEGATIVE Corrected     Comment:        The GeneXpert MRSA Assay (FDA approved for NASAL specimens only), is one component of a comprehensive MRSA colonization surveillance program. It is not intended to diagnose MRSA infection nor to guide or monitor treatment for MRSA infections. RESULT CALLED TO, READ BACK BY AND VERIFIED WITH: RN Charise Killian [id number] MLM CORRECTED ON 09/03 AT 0706: PREVIOUSLY REPORTED AS POSITIVE        The GeneXpert MRSA Assay (FDA approved for NASAL specimens only), is one component of a comprehensive MRSA colonization surveillance program. It is not intended to diagnose MRSA infection  nor to guide or monitor treatment for MRSA infections. CRITICAL RESULT CALLED TO, READ BACK BY AND VERIFIED WITH: RN Charise Killian [id number] MLM      Labs: Basic Metabolic Panel:  Recent Labs Lab 12/01/16 1347 12/02/16 0543 12/03/16 0456 12/04/16 0551 12/05/16 0543  NA 138 139 139 138 137  K 3.5 3.2* 3.9 4.5 5.0  CL 105 100* 101 100* 97*  CO2 GLUCOSE 97 87 85 102* 91  BUN 18 20 26* 23* 26*  CREATININE 1.40* 1.51* 1.65* 1.61* 1.47*  CALCIUM 8.9 8.6* 8.6* 9.3 9.7   Liver Function Tests:  Recent Labs Lab 12/03/16 0456  AST 53*  ALT 50  ALKPHOS 73  BILITOT 1.5*  PROT 6.6  ALBUMIN 3.2*   No results for input(s): LIPASE, AMYLASE in the last 168 hours. No results for input(s): AMMONIA in the last 168 hours. CBC:  Recent Labs Lab 12/01/16 1347 12/03/16 0456 12/04/16 0551 12/05/16 0543  WBC 12.0* 9.9 11.0* 7.7  HGB 13.8 14.0 14.9 13.5  HCT 39.0 39.7 42.3 40.7  MCV 85.2 85.2 86.9 87.2  PLT 270 181 305 282   Cardiac Enzymes:  Recent Labs Lab 12/01/16 1751 12/01/16 2358 12/02/16 0543  TROPONINI 0.50* 0.40* 0.30*   BNP: BNP (last 3 results)  Recent Labs  12/01/16 1347  BNP 2,326.1*    ProBNP (last 3 results) No results for input(s): PROBNP in the last  8760 hours.  CBG: No results for input(s): GLUCAP in the last 168  hours.     SignedAlbertine Grates MD, PhD  [facility] 12/05/2016, 12:27 PM

## 2016-12-05 NOTE — Discharge Instructions (Signed)

## 2016-12-05 NOTE — Progress Notes (Signed)
Patient qualifies for the [facility] Fund (Medication Assistance Through [facility]) . MATCH form given to patient with explanation of usage. Abelino Derrick [facility] [phone]

## 2016-12-05 NOTE — Plan of Care (Signed)
Problem: Food- and Nutrition-Related Knowledge Deficit (NB-1.1) Goal: Nutrition education Formal process to instruct or train a patient/client in a skill or to impart knowledge to help patients/clients voluntarily manage or modify food choices and eating behavior to maintain or improve health. Outcome: Completed/Met Date Met: 12/05/16 Nutrition Education Note  RD consulted for nutrition education regarding  CHF. Pt admitted with acute on chronic CHF, uncontrolled HTN  RD provided "Heart Failure Nutrition Therapy" handout from the Academy of [employer] and [employer]etics. Reviewed patient's dietary recall. Provided examples on ways to decrease sodium intake in diet. Discouraged intake of processed foods and use of salt shaker. Encouraged fresh fruits and vegetables as well as whole grain sources of carbohydrates to maximize fiber intake.   RD discussed why it is important for patient to adhere to diet recommendations, and emphasized the role of fluids, foods to avoid, and importance of weighing self daily. Teach back method used.  Expect good compliance.  Body mass index is 26.14 kg/m.   Current diet order is Heart healthy, patient is consuming approximately 100% of meals at this time. Labs and medications reviewed. No further nutrition interventions warranted at this time. RD contact information provided. If additional nutrition issues arise, please re-consult RD.   Cate  MS, RD, LDN [phone] Pager  [phone] Weekend/On-Call Pager     

## 2016-12-05 NOTE — Progress Notes (Signed)
Discharge instructions (including medications) discussed with and copy provided to patient/caregiver 

## 2016-12-06 ENCOUNTER — Encounter (HOSPITAL_COMMUNITY): Payer: Self-pay | Admitting: *Deleted

## 2016-12-06 ENCOUNTER — Telehealth (HOSPITAL_COMMUNITY): Payer: Self-pay | Admitting: *Deleted

## 2016-12-06 ENCOUNTER — Ambulatory Visit (INDEPENDENT_AMBULATORY_CARE_PROVIDER_SITE_OTHER): Payer: Self-pay | Admitting: *Deleted

## 2016-12-06 DIAGNOSIS — Z5181 Encounter for therapeutic drug level monitoring: Secondary | ICD-10-CM

## 2016-12-06 DIAGNOSIS — I428 Other cardiomyopathies: Secondary | ICD-10-CM

## 2016-12-06 DIAGNOSIS — I513 Intracardiac thrombosis, not elsewhere classified: Secondary | ICD-10-CM

## 2016-12-06 DIAGNOSIS — I240 Acute coronary thrombosis not resulting in myocardial infarction: Secondary | ICD-10-CM

## 2016-12-06 LAB — POCT INR: INR: 1.2

## 2016-12-06 NOTE — Patient Instructions (Signed)

## 2016-12-06 NOTE — Telephone Encounter (Signed)
Pt called to let us know he was just in the hospital and his EF dropped back to 10-15%, he states he needs letter to be out of work.  Per chart pt was hospitalized 9/2-9/6 w/EF 10-15% and LV thrombi, per Dr Gala Romney ok to do letter for pt to be out of work, letter completed, pt aware and will p/u at our office, he is sch for f/u with [place] on 9/13

## 2016-12-10 ENCOUNTER — Telehealth (HOSPITAL_COMMUNITY): Payer: Self-pay | Admitting: Surgery

## 2016-12-10 ENCOUNTER — Ambulatory Visit (INDEPENDENT_AMBULATORY_CARE_PROVIDER_SITE_OTHER): Payer: Self-pay | Admitting: *Deleted

## 2016-12-10 DIAGNOSIS — I428 Other cardiomyopathies: Secondary | ICD-10-CM

## 2016-12-10 DIAGNOSIS — I513 Intracardiac thrombosis, not elsewhere classified: Secondary | ICD-10-CM

## 2016-12-10 DIAGNOSIS — I240 Acute coronary thrombosis not resulting in myocardial infarction: Secondary | ICD-10-CM

## 2016-12-10 DIAGNOSIS — Z5181 Encounter for therapeutic drug level monitoring: Secondary | ICD-10-CM

## 2016-12-10 LAB — POCT INR: INR: 2.8

## 2016-12-10 NOTE — Telephone Encounter (Signed)
HF Nurse Navigator Post Discharge Telephone Call I called and spoke with Robert Booth regarding his recent hospitalization.  He tells me that he is doing as "well as to be expected".  He denies any increased SOB or swelling.  He says his weight today is 174 lbs versus discharge weight of 177 on 12/05/16.  He denies any issues with getting or taking prescribed medications.  I reminded him of upcoming appt in [facility] on 12/12/16 at 1000 am.  He is planning to be at this appt.  I encouraged him to call with any concerns or questions regarding his HF.

## 2016-12-12 ENCOUNTER — Ambulatory Visit (HOSPITAL_COMMUNITY)
Admission: RE | Admit: 2016-12-12 | Discharge: 2016-12-12 | Disposition: A | Discharge status: Home / Self Care | Payer: Medicaid Other | Source: Ambulatory Visit | Attending: Internal Medicine | Admitting: Internal Medicine

## 2016-12-12 ENCOUNTER — Encounter (HOSPITAL_COMMUNITY): Payer: Self-pay | Admitting: Internal Medicine

## 2016-12-12 ENCOUNTER — Other Ambulatory Visit (HOSPITAL_COMMUNITY): Payer: Self-pay

## 2016-12-12 VITALS — BP 123/89 | HR 101 | Ht 69.0 in | Wt 181.0 lb

## 2016-12-12 DIAGNOSIS — Z7901 Long term (current) use of anticoagulants: Secondary | ICD-10-CM | POA: Insufficient documentation

## 2016-12-12 DIAGNOSIS — I130 Hypertensive heart and chronic kidney disease with heart failure and stage 1 through stage 4 chronic kidney disease, or unspecified chronic kidney disease: Secondary | ICD-10-CM | POA: Diagnosis not present

## 2016-12-12 DIAGNOSIS — N182 Chronic kidney disease, stage 2 (mild): Secondary | ICD-10-CM | POA: Diagnosis not present

## 2016-12-12 DIAGNOSIS — I5022 Chronic systolic (congestive) heart failure: Secondary | ICD-10-CM | POA: Diagnosis not present

## 2016-12-12 DIAGNOSIS — Z79899 Other long term (current) drug therapy: Secondary | ICD-10-CM | POA: Insufficient documentation

## 2016-12-12 DIAGNOSIS — Z87891 Personal history of nicotine dependence: Secondary | ICD-10-CM | POA: Insufficient documentation

## 2016-12-12 DIAGNOSIS — I429 Cardiomyopathy, unspecified: Secondary | ICD-10-CM | POA: Diagnosis not present

## 2016-12-12 LAB — BASIC METABOLIC PANEL
ANION GAP: 8 (ref 5–15)
BUN: 18 mg/dL (ref 6–20)
CHLORIDE: 101 mmol/L (ref 101–111)
CO2: 28 mmol/L (ref 22–32)
Calcium: 9.3 mg/dL (ref 8.9–10.3)
Creatinine, Ser: 1.27 mg/dL — ABNORMAL HIGH (ref 0.61–1.24)
GFR calc Af Amer: >60 mL/min (ref >60)
GFR calc non Af Amer: >60 mL/min (ref >60)
Glucose, Bld: 85 mg/dL (ref 65–99)
POTASSIUM: 3.7 mmol/L (ref 3.5–5.1)
SODIUM: 137 mmol/L (ref 135–145)

## 2016-12-12 LAB — BRAIN NATRIURETIC PEPTIDE: B NATRIURETIC PEPTIDE 5: 310.3 pg/mL — AB (ref 0.0–100.0)

## 2016-12-12 MED ORDER — FUROSEMIDE 20 MG PO TABS: 60.0000 mg | ORAL_TABLET | Freq: Every day | ORAL | 3 refills | Status: DC

## 2016-12-12 MED ORDER — DIGOXIN 125 MCG PO TABS: 0.1250 mg | ORAL_TABLET | Freq: Every day | ORAL | 3 refills | Status: DC

## 2016-12-12 MED ORDER — HYDRALAZINE HCL 50 MG PO TABS: 75.0000 mg | ORAL_TABLET | Freq: Three times a day (TID) | ORAL | 3 refills | Status: DC

## 2016-12-12 MED FILL — DIGOXIN 0.125 MG TABLET: 125 | 90 days supply | Qty: 90 | Fill #0

## 2016-12-12 NOTE — Progress Notes (Signed)
Patient ID: Robert Booth, male   DOB: 1971/03/08, 46 y.o.   MRN: [id number] \  Advanced Heart Failure Clinic Note   [facility]  Primary HF Cardiologist: Dr Gala Romney  HPI: Robert Booth is a 46 y.o. male with hx of systolic HF with NICM secondary to prolonged ETOH abuse, EF 15% 2018, and CKD stage 2.  In 8/16, he presented to [facility] with hemoptysis. Found to have RLL PNA and cardiogenic shock. Treated briefly with milrinone and levophed. Diuresed well. Discharge weight 177 .   EF subsequently improved.   Hospitalized 9/2 through 12/05/2016 with recurrent A/C systolic heart failure in the setting of medication noncompliance. He had been oout of medications for 4 months. Echo with EF 10-15%   Diuresed with IV lasix.  HF meds restarted.   Today he returns for post hospital follow up. Complaining of fatigue. SOB with steps. No orthopnea, PND or edema.  Not drinking alcohol. Weight at home 177 pounds.  Currently not working. Stopped working August. Requires assistance with transportation.   Echo 10/13/14 EF 20% Echo 12/16 EF 35 - 40%No LV clot Echo 11/30/15 EF 35%  Echo  10/2016 EF 10-15%.   ROS: All systems negative except as listed in HPI, PMH and Problem List.  SH:  Social History   Social History  . Marital status: Single    Spouse name: N/A  . Number of children: 6  . Years of education: N/A   Occupational History  . Unemployed    Social History Main Topics  . Smoking status: Former Smoker    Types: Cigarettes  . Smokeless tobacco: Former Neurosurgeon    Types: Chew     Comment: Was a rare smoker  . Alcohol use No  . Drug use: No  . Sexual activity: Yes   Other Topics Concern  . Not on file   Social History Narrative   Lives with mom.      FH:  Family History  Problem Relation Age of Onset  . Hypertension Mother   . Hypertension Father   . Heart attack Father 34       died  . Hypertension Sister   . Heart attack Paternal Grandfather 50   died    Past Medical History:  Diagnosis Date  . Anxiety   . CHF (congestive heart failure) (HCC)   . Essential hypertension   . History of alcohol abuse    a. Sober since Feb 2016.  . 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 NICM (nonischemic cardiomyopathy) (HCC)    a. 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    Current Outpatient Prescriptions  Medication Sig Dispense Refill  . carvedilol (COREG) 6.25 MG tablet Take 1 tablet (6.25 mg total) by mouth 2 (two) times daily with a meal. 60 tablet 0  . folic acid (FOLVITE) 1 MG tablet Take 1 tablet (1 mg total) by mouth daily. 30 tablet 0  . furosemide (LASIX) 20 MG tablet Take 3 tablets (60 mg total) by mouth 2 (two) times daily. 60 tablet 0  . hydrALAZINE (APRESOLINE) 25 MG tablet Take 37.5 mg by mouth 3 (three) times daily.    . isosorbide mononitrate (IMDUR) 30 MG 24 hr tablet Take 30 mg by mouth daily.    . Multiple Vitamin (MULTIVITAMIN WITH  MINERALS) TABS tablet Take 1 tablet by mouth daily. 30 tablet 0  . spironolactone (ALDACTONE) 25 MG tablet TAKE 1 TABLET BY MOUTH ONCE DAILY (Patient taking differently: TAKE 25 mg TABLET BY MOUTH ONCE DAILY) 34 tablet 2  . thiamine 100 MG tablet Take 1 tablet (100 mg total) by mouth daily. 30 tablet 0  . warfarin (COUMADIN) 7.5 MG tablet Take 11.25mg  (one and half tab) on MWF, take 7.5mg  ( one tab) on TTSS 90 tablet 0   No current facility-administered medications for this encounter.     Vitals:   12/12/16 0932  BP: 123/89  Pulse: (!) 101  SpO2: 100%  Weight: 181 lb (82.1 kg)  Height: 5\' 9"  (1.753 m)   Filed Weights   12/12/16 0932  Weight: 181 lb (82.1 kg)    PHYSICAL EXAM: General:  Well appearing. No resp difficulty HEENT: normal Neck: supple. no JVD. Carotids 2+  bilat; no bruits. No lymphadenopathy or thryomegaly appreciated. Cor: PMI laterally displaced. Regular rate & rhythm. No rubs, gallops or murmurs. Lungs: clear Abdomen: soft, nontender, nondistended. No hepatosplenomegaly. No bruits or masses. Good bowel sounds. Extremities: no cyanosis, clubbing, rash, edema Neuro: alert & orientedx3, cranial nerves grossly intact. moves all 4 extremities w/o difficulty. Affect pleasant  ASSESSMENT & PLAN: 1. Chronic systolic HF -NICM  Cath ok performed [place] 2016. 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  - Now has recurrent LV dysfunction after stopping HF meds. Echo 8/17 EF 35% ECHO 10/2016 EF 10% -15% .  - NYHA III. Volume status on the low side with occasional orthostasis.  Cut lasix back to 60 mg daily.  - Continue carvedilol 6.25 mg twice a day. Add 0.125 mg digoxin.  - Increase hydralazine 75 mg three times a day  - Continue imdur 30 mg daily - Continue spiro 25 mg daily  - BMET and BNP today.  2. LV mural thrombus - noted on ECHO 11/2016 On coumadin.  3. H/o ETOH abuse quit 05/25/2014 was drinking 1/5 liquor daily. -   Follow up in 2 weeks   Tonye Becket, NP-C  10:17 AM  Patient seen and examined with Tonye Becket, NP. We discussed all aspects of the encounter. I agree with the assessment and plan as stated above.   Now with recurrent, severe LV dysfunction in setting of medication non-compliance. NYHA III. Volume status ok. Agree with changes as above. Stressed need to be compliant with meds. Call us ASAP if symptoms worsening.   Arvilla Meres, MD  3:07 PM

## 2016-12-12 NOTE — Progress Notes (Signed)
Paramedicine Encounter   Patient ID: Robert Booth , male,   DOB: 04-Jul-1970,45 y.o.,  MRN: [id number]   Met patient in clinic today with provider Amy/Dr. Enid Derry.   Today is pt's initial CHP visit.  Pt was advised of the program and the benefits of the program.  Pt stated that he went without of his medications for about 4 months.  Pt then came into the hospital and had a 4 day stay.  He stated that since his hospital stay he has moved in with his sister who works as a Quarry manager at [employer].  Pt stated that he had a laspe in [place] but he has application on file now.  Pt will continue to receive his medications through the heart failure fund until his medicaid is approved.    Pt has no transportation issues.  His PCP is at [facility].  ** medications change: Increase hydralazine 75 mg 3x daily Decrease lasix 60 once a day (pt takes in the am) Began Digoxin   Time spent with patient 30 mins  Rheannon Cerney EMT-Paramedic 12/12/2016   ACTION: Next visit planned for next week

## 2016-12-12 NOTE — Patient Instructions (Signed)
Start Digoxin 0.125mg  (1 tab) daily  Increase Hydralazine to 75 mg (1.5 tab), three times a day  Decrease Furosemide 60 mg (3 tabs) DAILY  Labs drawn today  Your physician recommends that you schedule a follow-up appointment in: [facility]  Your physician recommends that you schedule a follow-up appointment in: [facility] in [facility]

## 2016-12-16 ENCOUNTER — Ambulatory Visit (INDEPENDENT_AMBULATORY_CARE_PROVIDER_SITE_OTHER): Payer: Self-pay | Admitting: *Deleted

## 2016-12-16 DIAGNOSIS — I428 Other cardiomyopathies: Secondary | ICD-10-CM

## 2016-12-16 DIAGNOSIS — I513 Intracardiac thrombosis, not elsewhere classified: Secondary | ICD-10-CM

## 2016-12-16 DIAGNOSIS — I240 Acute coronary thrombosis not resulting in myocardial infarction: Secondary | ICD-10-CM

## 2016-12-16 DIAGNOSIS — Z5181 Encounter for therapeutic drug level monitoring: Secondary | ICD-10-CM

## 2016-12-16 LAB — POCT INR: INR: 3.9

## 2016-12-18 ENCOUNTER — Ambulatory Visit: Payer: Medicaid Other | Attending: Internal Medicine | Admitting: Physician Assistant

## 2016-12-18 ENCOUNTER — Telehealth (HOSPITAL_COMMUNITY): Payer: Self-pay | Admitting: *Deleted

## 2016-12-18 ENCOUNTER — Other Ambulatory Visit: Payer: Self-pay

## 2016-12-18 ENCOUNTER — Encounter: Payer: Self-pay | Admitting: Physician Assistant

## 2016-12-18 VITALS — BP 105/69 | HR 92 | Temp 97.7°F | Resp 18 | Ht 69.0 in | Wt 184.0 lb

## 2016-12-18 DIAGNOSIS — I5022 Chronic systolic (congestive) heart failure: Secondary | ICD-10-CM | POA: Diagnosis not present

## 2016-12-18 DIAGNOSIS — I10 Essential (primary) hypertension: Secondary | ICD-10-CM

## 2016-12-18 DIAGNOSIS — Z79899 Other long term (current) drug therapy: Secondary | ICD-10-CM | POA: Insufficient documentation

## 2016-12-18 DIAGNOSIS — I110 Hypertensive heart disease with heart failure: Secondary | ICD-10-CM | POA: Diagnosis not present

## 2016-12-18 DIAGNOSIS — I513 Intracardiac thrombosis, not elsewhere classified: Secondary | ICD-10-CM

## 2016-12-18 DIAGNOSIS — F1020 Alcohol dependence, uncomplicated: Secondary | ICD-10-CM | POA: Diagnosis not present

## 2016-12-18 DIAGNOSIS — Z888 Allergy status to other drugs, medicaments and biological substances status: Secondary | ICD-10-CM | POA: Insufficient documentation

## 2016-12-18 DIAGNOSIS — I240 Acute coronary thrombosis not resulting in myocardial infarction: Secondary | ICD-10-CM

## 2016-12-18 DIAGNOSIS — F419 Anxiety disorder, unspecified: Secondary | ICD-10-CM | POA: Insufficient documentation

## 2016-12-18 DIAGNOSIS — Z7901 Long term (current) use of anticoagulants: Secondary | ICD-10-CM | POA: Insufficient documentation

## 2016-12-18 DIAGNOSIS — R635 Abnormal weight gain: Secondary | ICD-10-CM

## 2016-12-18 DIAGNOSIS — I428 Other cardiomyopathies: Secondary | ICD-10-CM | POA: Diagnosis not present

## 2016-12-18 DIAGNOSIS — Z86718 Personal history of other venous thrombosis and embolism: Secondary | ICD-10-CM | POA: Insufficient documentation

## 2016-12-18 MED ORDER — ISOSORBIDE MONONITRATE ER 30 MG PO TB24: 30.0000 mg | ORAL_TABLET | Freq: Every day | ORAL | 2 refills | Status: DC

## 2016-12-18 MED ORDER — FUROSEMIDE 20 MG PO TABS: 60.0000 mg | ORAL_TABLET | Freq: Every day | ORAL | 3 refills | Status: DC

## 2016-12-18 MED FILL — ISOSORBIDE MN ER 30 MG TAB: 30 | 30 days supply | Qty: 30 | Fill #0 | Status: TO

## 2016-12-18 MED FILL — FUROSEMIDE 20 MG TABLET: 20 | 30 days supply | Qty: 90 | Fill #0

## 2016-12-18 NOTE — Progress Notes (Signed)
Patient ID: Robert Booth, male   DOB: [birth year], 46 y.o.   MRN: [id number]     Zvi Duplantis, is a 46 y.o. male  WUJ:[id number]  NFA:[id number]  DOB - [birth year]  Subjective:  Chief Complaint and HPI: Robert Booth is a 46 y.o. male here today for a follow up visit After being hospitalized 12/01/2016-12/05/2016.  He was admitted for SOB and suspected heart failure.  Repeat BMP was done by cardiology on 12/12/2016 and was much improved.  He presents today with a 7 pound weight gain since being seen by cardiology on 12/12/2016.  He had mild orthopnea last night and was unable to lay flat to sleep.  He denies CP/SOB.  No calf pain.  Compliant on all meds.  Sober X 2 years.  He denies edema  From d/c summary: Chief Complaint: Dyspnea on exertion and orthopnea  HPI: Robert Booth a 45 y.o.malewith medical history significant for prior alcoholism, nonischemic cardiomyopathy with chronic systolic heart failure, hypertension and history of LV mural thrombus. Patient reports that in the past few months he recently discovered he was no longer Medicaid eligible and has repeatedly attempted to obtain SSI disability and has been denied. Because of this he has been unable to obtain his prescriptions. During the past 3-4 months he has attempted to reestablish his Medicaid but has been unsuccessful. Because of this he is only been able to afford his Lasix and Aldactone and therefore has not taken his other cardiac medications as prescribed. He has previously been established with the heart failure clinic but due to lack of insurance he has been unable to follow up. He was last evaluated by healthcare provider [facility] this [facility]. Upon presentation to the ER patient was complaining of dyspnea on exertion, orthopnea and increased abdominal swelling without lower extremity swelling. He had an elevated BNP of 2326 and elevated poctroponin of 0.42. Chest x-ray  consistent with pulmonary venous hypertension without overt edema. No documentation as to whether patient was hypoxemic at presentation. Patient will be admitted for IV Lasix regarding suspected acute heart failure exacerbation in setting of nonadherence to medications due to inability to procure.  Hospital Course:  Principal Problem:   Acute on chronic combined systolic and diastolic CHF, NYHA class 3 (HCC) Active Problems:   Alcoholism (HCC)   Uncontrolled hypertension   NICM (nonischemic cardiomyopathy) (HCC)   Elevated troponin   Acute on chronic combined systolic and diastolic heart failure/nonischemic cardiomyopathy:  -presented with DOE, orthopnea --Echocardiogram with EF of 10-15% and left ventricular thrombus. bnp 2326 with troponin peaked at 0.5, denies chest pain - he was treated with iv lasix initially, lasix switched to oral since 9/4 due to cr elevation, patient continue to make good urine - Continue BiDil ( imdur  tid/hydralazine  tid for affordability), Coreg., spironolectone -Discontinue Entresto because of renal failure  -improving, cardiology input appreciated, he is cleared to discharge home with close cardiology follow up. --Case manager to help with medications/insurance on discharge  Recurrent LV thrombus: -With prior h/o lv thrombus ( resolved after anticoagulation in the past) -Started IV heparinsince 9/3 , coumadin started on 9/4 -Pharmacy to dose coumadin,  -he is cleared to discharge home by cardiology , he is discharged on coumadin/lovenox bridge    Uncontrolled hypertension on admission in the setting of medication noncompliance: uds negative Blood pressure improved. Continue current cardiac medications.  History of alcohol use. Continue supportive care. Reported not drinking for more than a year  now. 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 Cr around 1 in 2017 Cr 1.4-1.5-1.65-1.6-1.47,  new baseline? ua no infection, good urine output. Mild elevation in serum creatinine level likely hemodynamically mediated in the setting of diuretics and heart failure.  Holding [place]. Monitor BMP. Avoid nephrotoxins.   Hypokalemia: Replete potassium chloride replaced k wnl at discharge, he is to have repeat bmp at hospital follow up.   Medication noncompliance: Case Chief Strategy Officer letter provided  DVT prophylaxis:IV heparin/coumadin Code Status: Full code Family Communication: No family at bedside Disposition Plan: d/c home with cardiology clearance.  Cardiology f/up post discharge was on 9/13/29018: ASSESSMENT & PLAN: 1. Chronic systolic HF -NICM  Cath ok performed [place] 2016. 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  - Now has recurrent LV dysfunction after stopping HF meds. Echo 8/17 EF 35% ECHO 10/2016 EF 10% -15% .  - NYHA III. Volume status on the low side with occasional orthostasis.  Cut lasix back to 60 mg daily.  - Continue carvedilol 6.25 mg twice a day. Add 0.125 mg digoxin.  - Increase hydralazine 75 mg three times a day  - Continue imdur 30 mg daily - Continue spiro 25 mg daily  - BMET and BNP today.  2. LV mural thrombus - noted on ECHO 11/2016 On coumadin.  3. H/o ETOH abuse quit 05/25/2014 was drinking 1/5 liquor daily. -  Follow up in 2 weeks .   ED/Hospital notes reviewed.   Social History: Family history:  ROS:   Constitutional:  No f/c, No night sweats, No unexplained weight loss. EENT:  No vision changes, No blurry vision, No hearing changes. No mouth, throat, or ear problems.  Respiratory: No cough, No SOB Cardiac: No CP, no palpitations GI:  No abd pain, No N/V/D. GU: No Urinary s/sx Musculoskeletal: No joint pain Neuro: No headache, no dizziness, no motor weakness.  Skin: No rash Endocrine:  No polydipsia. No polyuria.  Psych: Denies SI/HI  No problems updated.  ALLERGIES: Allergies  Allergen Reactions  . Celexa [Citalopram  Hydrobromide] Other (See Comments)    DISORIENTED  . Lisinopril Cough    PAST MEDICAL HISTORY: Past Medical History:  Diagnosis Date  . Anxiety   . CHF (congestive heart failure) (HCC)   . Essential hypertension   . History of alcohol abuse    a. Sober since Feb 2016.  . 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 NICM (nonischemic cardiomyopathy) (HCC)    a. 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    MEDICATIONS AT HOME: Prior to Admission medications   Medication Sig Start Date End Date Taking? Authorizing Provider  carvedilol (COREG) 6.25 MG tablet Take 1 tablet (6.25 mg total) by mouth 2 (two) times daily with a meal. 12/05/16  Yes Albertine Grates, MD  digoxin (LANOXIN) 0.125 MG tablet Take 1 tablet (0.125 mg total) by mouth daily. 12/12/16  Yes Bensimhon, Bevelyn Buckles, MD  folic acid (FOLVITE) 1 MG tablet Take 1 tablet (1 mg total) by mouth daily. 12/06/16  Yes Albertine Grates, MD  furosemide (LASIX) 20 MG tablet Take 3 tablets (60 mg total) by mouth daily. 12/18/16  Yes Anders Simmonds, PA-C  hydrALAZINE (APRESOLINE) 50 MG tablet Take 1.5 tablets (75 mg total) by mouth 3 (three) times daily. 12/12/16  Yes Bensimhon, Bevelyn Buckles, MD  isosorbide mononitrate (IMDUR) 30 MG 24 hr tablet Take 1 tablet (30 mg total) by mouth daily. 12/18/16  Yes Anders Simmonds, PA-C  Multiple Vitamin (MULTIVITAMIN WITH MINERALS) TABS tablet Take 1 tablet by mouth daily. 12/06/16  Yes Albertine Grates, MD  spironolactone (ALDACTONE) 25 MG tablet TAKE 1 TABLET BY MOUTH ONCE DAILY Patient taking differently: TAKE 25 mg TABLET BY MOUTH ONCE DAILY 06/21/16  Yes Bensimhon, Bevelyn Buckles, MD  thiamine 100 MG tablet Take 1 tablet (100 mg total) by mouth daily. 12/06/16  Yes Albertine Grates, MD  warfarin (COUMADIN) 7.5 MG tablet Take 11.25mg  (one and  half tab) on MWF, take 7.5mg  ( one tab) on TTSS 12/05/16  Yes Albertine Grates, MD     Objective:  EXAM:   Vitals:   12/18/16 1037  BP: 105/69  Pulse: 92  Resp: 18  Temp: 97.7 F (36.5 C)  TempSrc: Oral  SpO2: 98%  Weight: 184 lb (83.5 kg)  Height: 5\' 9"  (1.753 m)    General appearance : A&OX3. NAD. Non-toxic-appearing HEENT: Atraumatic and Normocephalic.  PERRLA. EOM intact.   Neck: supple, no JVD. No cervical lymphadenopathy. No thyromegaly Chest/Lungs:  Breathing-non-labored, Good air entry bilaterally, breath sounds normal without rales, rhonchi, or wheezing  CVS: S1 S2 regular, no murmurs, gallops, rubs  Abdomen: Bowel sounds present, Non tender and not distended with no gaurding, rigidity or rebound. Extremities: Bilateral Lower Ext shows no edema! both legs are warm to touch with = pulse throughout Neurology:  CN II-XII grossly intact, Non focal.   Psych:  TP linear. J/I WNL. Normal speech. Appropriate eye contact and affect.  Skin:  No Rash  Data Review Lab Results  Component Value Date   HGBA1C 4.9 07/21/2012     Assessment & Plan   1. Essential hypertension Controlled-continue current regimen  2. LV (left ventricular) mural thrombus without MI Keep cardiology f/up.  Continue current regimen  3. Chronic systolic heart failure [facility]) Keep cardiology f/up.  Continue current regimen  4. Weight gain-no edema Call cardiology bc they had said to notify them if he had gained more than 5 pounds in a week-he has gained 7 pounds since 12/12/2016.  I will have him call cardiology for input.  He is already on 60mg  of lasix and spironolactone   Patient have been counseled extensively about nutrition and exercise  Return in about 3 weeks (around 01/08/2017) for assign new PCP; f/up heart failure/htn.  The patient was given clear instructions to go to ER or return to medical center if symptoms don't improve, worsen or new problems develop. The patient verbalized understanding.  The patient was told to call to get lab results if they haven't heard anything in the next week.     Georgian Co, PA-C [facility] and [facility] [place], [place] [phone]   12/18/2016, 11:01 AM

## 2016-12-18 NOTE — Patient Instructions (Signed)
Call cardiology due to 7 pound weight gain in 1 week and mild orthopnea last night

## 2016-12-18 NOTE — Telephone Encounter (Signed)
Advanced Heart Failure Triage Encounter  Patient Name: Robert Booth  Date of Call: 12/18/16  Problem: Weight Gain  Patient called to report his weight has increased to 184 lbs from 177lbs on 9/13.  No SOB.  Plan:  Spoke with Maxine Glenn, PA and he advises patient to take 60 mg of lasix Twice Daily for two days only.  Call us back if that doesn't bring his weight down.  Patient understands and no further questions.   Georgina Peer, RN

## 2016-12-19 ENCOUNTER — Other Ambulatory Visit (HOSPITAL_COMMUNITY): Payer: Self-pay

## 2016-12-19 MED FILL — SPIRONOLACTONE 25 MG TABLET: 25 | 34 days supply | Qty: 34 | Fill #2

## 2016-12-19 NOTE — Progress Notes (Signed)
Paramedicine Encounter    Patient ID: Robert Booth, male    DOB: April 08, 1970, 46 y.o.   MRN: [id number]    No care team member to display  Patient Active Problem List   Diagnosis Date Noted  . Acute on chronic combined systolic and diastolic CHF, NYHA class 3 (HCC) 12/01/2016  . Alcoholism (HCC) 12/01/2016  . Uncontrolled hypertension 12/01/2016  . NICM (nonischemic cardiomyopathy) (HCC) 12/01/2016  . Elevated troponin 12/01/2016  . Chronic cough 05/09/2016  . H/O ETOH abuse 12/14/2014  . Chronic systolic heart failure (HCC) 11/24/2014  . Elevated LFTs   . AKI (acute kidney injury) (HCC) 11/09/2014  . Subtherapeutic international normalized ratio (INR)   . Cardiac failure (HCC)   . Positive D dimer   . Anticoagulated on Coumadin 11/08/2014  . Visual disturbance 11/08/2014  . Encounter for therapeutic drug monitoring 10/21/2014  . Congestive dilated cardiomyopathy (HCC)   . History of alcohol abuse   . LV (left ventricular) mural thrombus without MI   . Nonischemic cardiomyopathy (HCC) 08/18/2014  . HTN (hypertension) 08/18/2014    Current Outpatient Prescriptions:  .  carvedilol (COREG) 6.25 MG tablet, Take 1 tablet (6.25 mg total) by mouth 2 (two) times daily with a meal., Disp: 60 tablet, Rfl: 0 .  digoxin (LANOXIN) 0.125 MG tablet, Take 1 tablet (0.125 mg total) by mouth daily., Disp: 90 tablet, Rfl: 3 .  folic acid (FOLVITE) 1 MG tablet, Take 1 tablet (1 mg total) by mouth daily., Disp: 30 tablet, Rfl: 0 .  furosemide (LASIX) 20 MG tablet, Take 3 tablets (60 mg total) by mouth daily., Disp: 90 tablet, Rfl: 3 .  hydrALAZINE (APRESOLINE) 50 MG tablet, Take 1.5 tablets (75 mg total) by mouth 3 (three) times daily., Disp: 135 tablet, Rfl: 3 .  isosorbide mononitrate (IMDUR) 30 MG 24 hr tablet, Take 1 tablet (30 mg total) by mouth daily., Disp: 30 tablet, Rfl: 2 .  Multiple Vitamin (MULTIVITAMIN WITH MINERALS) TABS tablet, Take 1 tablet by mouth daily., Disp: 30 tablet, Rfl:  0 .  spironolactone (ALDACTONE) 25 MG tablet, TAKE 1 TABLET BY MOUTH ONCE DAILY (Patient taking differently: TAKE 25 mg TABLET BY MOUTH ONCE DAILY), Disp: 34 tablet, Rfl: 2 .  thiamine 100 MG tablet, Take 1 tablet (100 mg total) by mouth daily., Disp: 30 tablet, Rfl: 0 .  warfarin (COUMADIN) 7.5 MG tablet, Take 11.25mg  (one and half tab) on MWF, take 7.5mg  ( one tab) on TTSS, Disp: 90 tablet, Rfl: 0 Allergies  Allergen Reactions  . Celexa [Citalopram Hydrobromide] Other (See Comments)    DISORIENTED  . Lisinopril Cough     Social History   Social History  . Marital status: Single    Spouse name: N/A  . Number of children: 6  . Years of education: N/A   Occupational History  . Unemployed    Social History Main Topics  . Smoking status: Former Smoker    Types: Cigarettes  . Smokeless tobacco: Former Neurosurgeon    Types: Chew     Comment: Was a rare smoker  . Alcohol use No  . Drug use: No  . Sexual activity: Yes   Other Topics Concern  . Not on file   Social History Narrative   Lives with mom.      Physical Exam  Pulmonary/Chest: No respiratory distress. He has no wheezes. He has no rales.  Abdominal: He exhibits no distension. There is no tenderness. There is no guarding.  Musculoskeletal: He exhibits no edema.  Skin: Skin is warm and dry. He is not diaphoretic.        Future Appointments Date Time Provider Department Center  12/23/2016 3:45 PM [facility]  12/26/2016 10:00 AM [facility] PHARMACY [facility] None  01/13/2017 9:00 AM [facility] PA/NP [facility] None    ATF pt CAO x4 sitting in the living room watching tv.  Pt stated that he is really tired and had trouble sleeping last night. He stated that he ususally have issues sleeping at night. He tries to go to bed early but he wakes up every two hours or so; around 4 am he just stays up.  He doses off throughout the day while watching tv. Pt and I discussed his diet and drinking  habits.  He stated that he only drinks about 64 oz of fluid a day and recently he fills quickly once he starts eating.  Pt stated that he noticed a difference in his appetite last week.  Pt eats a lot of fruits; we discussed the water content in fruits.  He was advised to drink less fluids when he eats fruit, yogurt, ice cream etc.  His daily activities has declined since his last hospital stay.  Pt seemed to be disappointed in not being able to work right now but he denies depression.  He had some SOB earlier this week along with some weight gain.  Pt stated that he had to use an extra pillow to sleep on mon or tues this week.  He has been keeping record of his weights and he noticed a 5 lb weight increase within a week.  He called the [facility] clinic yesterday about the same.  Pt was under the impression that he was supposed to take lasix 60 mg BID until further notice. I read the notes in epic which susie advise to take "lasix 60 mg twice daily for two days".  I advised him of the same; last dose taken today @ 11.  Pt has seen a 3lb weight decrease since he took the extra pills yesterday.  He stated that he feels a lot better.   Pt has a scale and weighs daily; he's recording his weights.  I gave him a calendar to help keep record. Pt was also given a pill box and I explained his pill box and the placement of his meds.  Pt's mother drives him to his appointments. He filed for disability last week and is still awaiting medicaid approval.   BP 112/80   Pulse 80   Resp 16   Wt 185 lb 3.2 oz (84 kg)   SpO2 98%   BMI 27.35 kg/m   Weight yesterday-185 Last visit weight-181 (clinic visit)    Ashleymarie Granderson, EMT Paramedic 12/19/2016    ACTION: Home visit completed Next visit planned for next thurs

## 2016-12-23 ENCOUNTER — Ambulatory Visit (INDEPENDENT_AMBULATORY_CARE_PROVIDER_SITE_OTHER): Payer: Self-pay | Admitting: Pharmacist

## 2016-12-23 DIAGNOSIS — I240 Acute coronary thrombosis not resulting in myocardial infarction: Secondary | ICD-10-CM

## 2016-12-23 DIAGNOSIS — I513 Intracardiac thrombosis, not elsewhere classified: Secondary | ICD-10-CM

## 2016-12-23 DIAGNOSIS — I428 Other cardiomyopathies: Secondary | ICD-10-CM

## 2016-12-23 DIAGNOSIS — Z5181 Encounter for therapeutic drug level monitoring: Secondary | ICD-10-CM

## 2016-12-23 LAB — POCT INR: INR: 3.1

## 2016-12-26 ENCOUNTER — Ambulatory Visit (HOSPITAL_COMMUNITY)
Admission: RE | Admit: 2016-12-26 | Discharge: 2016-12-26 | Disposition: A | Discharge status: Home / Self Care | Payer: Medicaid Other | Source: Ambulatory Visit | Attending: Internal Medicine | Admitting: Internal Medicine

## 2016-12-26 ENCOUNTER — Other Ambulatory Visit (HOSPITAL_COMMUNITY): Payer: Self-pay

## 2016-12-26 ENCOUNTER — Encounter (HOSPITAL_COMMUNITY): Payer: Self-pay | Admitting: Pharmacist

## 2016-12-26 VITALS — BP 120/90 | HR 75 | Wt 185.8 lb

## 2016-12-26 DIAGNOSIS — F1010 Alcohol abuse, uncomplicated: Secondary | ICD-10-CM | POA: Diagnosis not present

## 2016-12-26 DIAGNOSIS — I5022 Chronic systolic (congestive) heart failure: Secondary | ICD-10-CM | POA: Insufficient documentation

## 2016-12-26 DIAGNOSIS — N182 Chronic kidney disease, stage 2 (mild): Secondary | ICD-10-CM | POA: Insufficient documentation

## 2016-12-26 DIAGNOSIS — I513 Intracardiac thrombosis, not elsewhere classified: Secondary | ICD-10-CM | POA: Insufficient documentation

## 2016-12-26 DIAGNOSIS — I428 Other cardiomyopathies: Secondary | ICD-10-CM | POA: Diagnosis not present

## 2016-12-26 LAB — BASIC METABOLIC PANEL
Anion gap: 9 (ref 5–15)
BUN: 16 mg/dL (ref 6–20)
CHLORIDE: 101 mmol/L (ref 101–111)
CO2: 28 mmol/L (ref 22–32)
Calcium: 9.5 mg/dL (ref 8.9–10.3)
Creatinine, Ser: 1.19 mg/dL (ref 0.61–1.24)
GFR calc Af Amer: >60 mL/min (ref >60)
GFR calc non Af Amer: >60 mL/min (ref >60)
Glucose, Bld: 84 mg/dL (ref 65–99)
POTASSIUM: 4.4 mmol/L (ref 3.5–5.1)
SODIUM: 138 mmol/L (ref 135–145)

## 2016-12-26 LAB — DIGOXIN LEVEL: Digoxin Level: 0.4 ng/mL — ABNORMAL LOW (ref 0.8–2.0)

## 2016-12-26 LAB — BRAIN NATRIURETIC PEPTIDE: B Natriuretic Peptide: 395.4 pg/mL — ABNORMAL HIGH (ref 0.0–100.0)

## 2016-12-26 MED ORDER — SACUBITRIL-VALSARTAN 24-26 MG PO TABS: 1.0000 | ORAL_TABLET | Freq: Two times a day (BID) | ORAL | 11 refills | Status: DC

## 2016-12-26 NOTE — Patient Instructions (Addendum)
It was great to see you today!  Please RESTART Entresto 24-26 mg TWICE DAILY.   Labs today. We will call you with any abnormalities.   Please keep your appointment with the NP/PA on 01/13/17.

## 2016-12-26 NOTE — Progress Notes (Signed)
HF MD: Gala Romney  HPI:  Robert Booth is a 46 y.o. male with hx of systolic HF with NICM secondary to prolonged ETOH abuse, EF 15% 2018, and CKD stage 2.  In 8/16, he presented to [facility] with hemoptysis. Found to have RLL PNA and cardiogenic shock. Treated briefly with milrinone and levophed. Diuresed well. Discharge weight 177 .   EF subsequently improved.   Hospitalized 9/2 through 12/05/2016 with recurrent A/C systolic heart failure in the setting of medication noncompliance. He had been oout of medications for 4 months. Echo with EF 10-15%   Diuresed with IV lasix.  HF meds restarted.   Today he returns for pharmacist-led HF medication titration. At his last HF clinic visit on 12/12/16, digoxin was started, hydralazine was increased to 75 mg TID and furosemide was decreased to 60 mg daily. Reports great compliance with his regimen. Still complaining of fatigue. Not drinking alcohol. Stopped working August. [place] pending.     . Shortness of breath/dyspnea on exertion? no  . Orthopnea/PND? no . Edema? no . Lightheadedness/dizziness? no . Daily weights at home? Yes - stable ~182-184 lb  . Blood pressure/heart rate monitoring at home? no . Following low-sodium/fluid-restricted diet? Yes   HF Medications: Carvedilol 6.25 mg PO BID Digoxin 0.125 mg PO daily Furosemide 60 mg PO daily Hydralazine 75 mg PO TID Isosorbide mononitrate 30 mg PO daily Spironolactone 25 mg PO daily   Has the patient been experiencing any side effects to the medications prescribed?  no  Does the patient have any problems obtaining medications due to transportation or finances?   Yes - [place] Medicaid pending, using HF fund  Understanding of regimen: good Understanding of indications: good Potential of compliance: good Patient understands to avoid NSAIDs. Patient understands to avoid decongestants.    Pertinent Lab Values: . 12/26/16: Serum creatinine 1.19 (BL ~1.1-1.3), BUN 16, Potassium 4.4, Sodium 138,  BNP 395, Digoxin 0.4   Vital Signs: . Weight: 185 lb (last HF clinic visit weight: 185 lb) . Blood pressure: 120/90 mmHg  . Heart rate: 75 bpm       ReDS Vest - 12/26/16 1000      ReDS Vest   MR  No   Estimated volume prior to reading Med   Fitting Posture Standing   Height Marker Tall   Ruler Value 9.5   Center Strip Shifted   ReDS Value 37       Assessment: 1. Chronicsystolic CHF (EF 16-10%), due to NICM (?ETOH CM). NYHA class II-IIIsymptoms.  - Volume status stable/slightly elevated by ReDS vest value of 37%  - Restart Entresto at low dose 24-26 mg BID (previously on 97-103 mg BID but d/c'd 2/2 AKI which has now resolved) -- had him fill out [employer] patient assistance application until [place] Medicaid approved  - Continue carvedilol 6.25 bid, digoxin 0.125 daily, furosemide 60 mg daily, hydralazine 75 mg TID, isosorbide mononitrate 30 mg daily, spironolactone 25 mg daily - Basic disease state pathophysiology, medication indication, mechanism and side effects reviewed at length with patient and he verbalized understanding 2. LV mural thrombus  - Noted on ECHO 11/2016   - On coumadin 3. H/o ETOH abuse   - Quit 05/25/2014 was drinking 1/5 liquor daily  Plan: 1) Medication changes: Based on clinical presentation, vital signs and recent labs will restart Entresto 24-26 mg BID 2) Labs: Dig/BMET/BNP today and at follow up 3) Follow-up: PA/NP on 01/13/17   Graceanna Theissen K. Bonnye Fava, PharmD, BCPS, CPP Clinical Pharmacist Pager: [phone] Phone: [phone]  12/26/2016 9:23 AM

## 2016-12-26 NOTE — Progress Notes (Signed)
Paramedicine Encounter    Patient ID: Robert Booth, male    DOB: April 17, 1970, 46 y.o.   MRN: [id number]    Patient Care Team: Patient, No Pcp Per as PCP - General (General Practice)  Patient Active Problem List   Diagnosis Date Noted  . Acute on chronic combined systolic and diastolic CHF, NYHA class 3 (HCC) 12/01/2016  . Alcoholism (HCC) 12/01/2016  . Uncontrolled hypertension 12/01/2016  . NICM (nonischemic cardiomyopathy) (HCC) 12/01/2016  . Elevated troponin 12/01/2016  . Chronic cough 05/09/2016  . H/O ETOH abuse 12/14/2014  . Chronic systolic heart failure (HCC) 11/24/2014  . Elevated LFTs   . AKI (acute kidney injury) (HCC) 11/09/2014  . Subtherapeutic international normalized ratio (INR)   . Cardiac failure (HCC)   . Positive D dimer   . Anticoagulated on Coumadin 11/08/2014  . Visual disturbance 11/08/2014  . Encounter for therapeutic drug monitoring 10/21/2014  . Congestive dilated cardiomyopathy (HCC)   . History of alcohol abuse   . LV (left ventricular) mural thrombus without MI   . Nonischemic cardiomyopathy (HCC) 08/18/2014  . HTN (hypertension) 08/18/2014    Current Outpatient Prescriptions:  .  carvedilol (COREG) 6.25 MG tablet, Take 1 tablet (6.25 mg total) by mouth 2 (two) times daily with a meal., Disp: 60 tablet, Rfl: 0 .  digoxin (LANOXIN) 0.125 MG tablet, Take 1 tablet (0.125 mg total) by mouth daily., Disp: 90 tablet, Rfl: 3 .  folic acid (FOLVITE) 1 MG tablet, Take 1 tablet (1 mg total) by mouth daily., Disp: 30 tablet, Rfl: 0 .  furosemide (LASIX) 20 MG tablet, Take 3 tablets (60 mg total) by mouth daily., Disp: 90 tablet, Rfl: 3 .  hydrALAZINE (APRESOLINE) 50 MG tablet, Take 1.5 tablets (75 mg total) by mouth 3 (three) times daily., Disp: 135 tablet, Rfl: 3 .  isosorbide mononitrate (IMDUR) 30 MG 24 hr tablet, Take 1 tablet (30 mg total) by mouth daily., Disp: 30 tablet, Rfl: 2 .  Multiple Vitamin (MULTIVITAMIN WITH MINERALS) TABS tablet, Take 1  tablet by mouth daily., Disp: 30 tablet, Rfl: 0 .  sacubitril-valsartan (ENTRESTO) 24-26 MG, Take 1 tablet by mouth 2 (two) times daily., Disp: 60 tablet, Rfl: 11 .  spironolactone (ALDACTONE) 25 MG tablet, TAKE 1 TABLET BY MOUTH ONCE DAILY, Disp: 34 tablet, Rfl: 2 .  warfarin (COUMADIN) 7.5 MG tablet, Take 11.25mg  (one and half tab) on MWF, take 7.5mg  ( one tab) on TTSS, Disp: 90 tablet, Rfl: 0 Allergies  Allergen Reactions  . Celexa [Citalopram Hydrobromide] Other (See Comments)    DISORIENTED  . Lisinopril Cough     Social History   Social History  . Marital status: Single    Spouse name: N/A  . Number of children: 6  . Years of education: N/A   Occupational History  . Unemployed    Social History Main Topics  . Smoking status: Former Smoker    Types: Cigarettes  . Smokeless tobacco: Former Neurosurgeon    Types: Chew     Comment: Was a rare smoker  . Alcohol use No  . Drug use: No  . Sexual activity: Yes   Other Topics Concern  . Not on file   Social History Narrative   Lives with mom.      Physical Exam  Pulmonary/Chest: No respiratory distress. He has no wheezes. He has no rales.  Abdominal: He exhibits no distension. There is no tenderness. There is no guarding.  Musculoskeletal: He exhibits no edema.  Skin: Skin is  warm and dry. He is not diaphoretic.        Future Appointments Date Time Provider Department Center  12/31/2016 8:00 AM [facility]  01/13/2017 9:00 AM [facility] PA/NP [facility] None    ATF pt CAO x4 putting up groceries. Pt stated that he is still tired throughout the day.  He went to his appointment today with Cicero Duck today and he received entresto samples.  Pt is hopeful that the entresto help increase his energy level. Pt had to use an extra pillow "a couple of times this week".  He stated that Cicero Duck put the heart vest on him today and that he has a small amount of fluid but not much.  He denies sob, dizziness,  and lightheadedness.  Pt took all of his meds this week without skipping.  rx bottles verified and pill box refilled.   BP 118/80   Pulse 83   Resp 16   Wt 183 lb 12.8 oz (83.4 kg)   SpO2 98%   BMI 27.14 kg/m   Weight yesterday-184 Last visit weight-185    Arlesia Kiel, EMT Paramedic 12/26/2016    ACTION: Home visit completed Next visit planned for next thursday

## 2016-12-30 ENCOUNTER — Telehealth (HOSPITAL_COMMUNITY): Payer: Self-pay | Admitting: Pharmacist

## 2016-12-30 NOTE — Telephone Encounter (Signed)
[employer] PAF approved for [employer] 24-26 mg BID through 12/28/17.   Tyler Deis. Bonnye Fava, PharmD, BCPS, CPP Clinical Pharmacist Pager: [phone] Phone: [phone] 12/30/2016 10:22 AM

## 2016-12-31 ENCOUNTER — Ambulatory Visit (INDEPENDENT_AMBULATORY_CARE_PROVIDER_SITE_OTHER): Payer: Self-pay | Admitting: *Deleted

## 2016-12-31 DIAGNOSIS — I240 Acute coronary thrombosis not resulting in myocardial infarction: Secondary | ICD-10-CM

## 2016-12-31 DIAGNOSIS — I513 Intracardiac thrombosis, not elsewhere classified: Secondary | ICD-10-CM

## 2016-12-31 DIAGNOSIS — I428 Other cardiomyopathies: Secondary | ICD-10-CM

## 2016-12-31 DIAGNOSIS — Z5181 Encounter for therapeutic drug level monitoring: Secondary | ICD-10-CM

## 2016-12-31 LAB — POCT INR: INR: 5.1

## 2017-01-01 MED FILL — CARVEDILOL 6.25 MG TABLET: 6.25 | 34 days supply | Qty: 68 | Fill #0

## 2017-01-01 MED FILL — hydrALAZINE HCL 50 MG TABS: 50 | 34 days supply | Qty: 153 | Fill #0

## 2017-01-02 ENCOUNTER — Other Ambulatory Visit (HOSPITAL_COMMUNITY): Payer: Self-pay

## 2017-01-02 ENCOUNTER — Other Ambulatory Visit (HOSPITAL_COMMUNITY): Payer: Self-pay | Admitting: Pharmacist

## 2017-01-02 NOTE — Progress Notes (Signed)
Paramedicine Encounter    Patient ID: Robert Booth, male    DOB: April 17, 1970, 46 y.o.   MRN: [id number]    Patient Care Team: Patient, No Pcp Per as PCP - General (General Practice)  Patient Active Problem List   Diagnosis Date Noted  . Acute on chronic combined systolic and diastolic CHF, NYHA class 3 (HCC) 12/01/2016  . Alcoholism (HCC) 12/01/2016  . Uncontrolled hypertension 12/01/2016  . NICM (nonischemic cardiomyopathy) (HCC) 12/01/2016  . Elevated troponin 12/01/2016  . Chronic cough 05/09/2016  . H/O ETOH abuse 12/14/2014  . Chronic systolic heart failure (HCC) 11/24/2014  . Elevated LFTs   . AKI (acute kidney injury) (HCC) 11/09/2014  . Subtherapeutic international normalized ratio (INR)   . Cardiac failure (HCC)   . Positive D dimer   . Anticoagulated on Coumadin 11/08/2014  . Visual disturbance 11/08/2014  . Encounter for therapeutic drug monitoring 10/21/2014  . Congestive dilated cardiomyopathy (HCC)   . History of alcohol abuse   . LV (left ventricular) mural thrombus without MI   . Nonischemic cardiomyopathy (HCC) 08/18/2014  . HTN (hypertension) 08/18/2014    Current Outpatient Prescriptions:  .  carvedilol (COREG) 6.25 MG tablet, Take 1 tablet (6.25 mg total) by mouth 2 (two) times daily with a meal., Disp: 60 tablet, Rfl: 0 .  digoxin (LANOXIN) 0.125 MG tablet, Take 1 tablet (0.125 mg total) by mouth daily., Disp: 90 tablet, Rfl: 3 .  folic acid (FOLVITE) 1 MG tablet, Take 1 tablet (1 mg total) by mouth daily., Disp: 30 tablet, Rfl: 0 .  furosemide (LASIX) 20 MG tablet, Take 3 tablets (60 mg total) by mouth daily., Disp: 90 tablet, Rfl: 3 .  hydrALAZINE (APRESOLINE) 50 MG tablet, Take 1.5 tablets (75 mg total) by mouth 3 (three) times daily., Disp: 135 tablet, Rfl: 3 .  isosorbide mononitrate (IMDUR) 30 MG 24 hr tablet, Take 1 tablet (30 mg total) by mouth daily., Disp: 30 tablet, Rfl: 2 .  Multiple Vitamin (MULTIVITAMIN WITH MINERALS) TABS tablet, Take 1  tablet by mouth daily., Disp: 30 tablet, Rfl: 0 .  sacubitril-valsartan (ENTRESTO) 24-26 MG, Take 1 tablet by mouth 2 (two) times daily., Disp: 60 tablet, Rfl: 11 .  spironolactone (ALDACTONE) 25 MG tablet, TAKE 1 TABLET BY MOUTH ONCE DAILY, Disp: 34 tablet, Rfl: 2 .  warfarin (COUMADIN) 7.5 MG tablet, Take 11.25mg  (one and half tab) on MWF, take 7.5mg  ( one tab) on TTSS, Disp: 90 tablet, Rfl: 0 Allergies  Allergen Reactions  . Celexa [Citalopram Hydrobromide] Other (See Comments)    DISORIENTED  . Lisinopril Cough     Social History   Social History  . Marital status: Single    Spouse name: N/A  . Number of children: 6  . Years of education: N/A   Occupational History  . Unemployed    Social History Main Topics  . Smoking status: Former Smoker    Types: Cigarettes  . Smokeless tobacco: Former Neurosurgeon    Types: Chew     Comment: Was a rare smoker  . Alcohol use No  . Drug use: No  . Sexual activity: Yes   Other Topics Concern  . Not on file   Social History Narrative   Lives with mom.      Physical Exam  Pulmonary/Chest: No respiratory distress. He has no wheezes. He has no rales.  Abdominal: He exhibits no distension. There is no tenderness. There is no guarding.  Musculoskeletal: He exhibits no edema.  Skin: Skin is  warm and dry. He is not diaphoretic.        Future Appointments Date Time Provider Department Center  01/09/2017 8:15 AM [facility]  01/13/2017 9:00 AM [facility] PA/NP [facility] None     ATF pt CAO x4 sitting in the living room visiting with his daughter.  Pt still c/o fatigue. He stated that he is still lossing sleep at night and waking up early in the morning.  Pt denies sob.  He hasn't had to increase the amount of pillows that he sleeps on. Still watching what he eats.  The weight increase that he's experienced this week, he thinks that its due to eating more food.  Pt has taken all of his meds this week.   His INR was checked on 10/2 @ 5.1.  Pt's pill box refilled according to epic and his last lab test.  rx bottles verified and pill box refilled.    BP (!) 116/94   Pulse 64   Resp 16   Wt 187 lb 3.2 oz (84.9 kg)   SpO2 97%   BMI 27.64 kg/m   Warfarin placed in the box according to last INR results.   **rx called in: Folic [id number]  Weight yesterday-186.2 Last visit weight-183.8    Laporshia Hogen, EMT Paramedic 01/02/2017    ACTION: Home visit completed

## 2017-01-03 ENCOUNTER — Other Ambulatory Visit (HOSPITAL_COMMUNITY): Payer: Self-pay | Admitting: Cardiology

## 2017-01-03 MED ORDER — FOLIC ACID 1 MG PO TABS: 1.0000 mg | ORAL_TABLET | Freq: Every day | ORAL | 0 refills | Status: DC

## 2017-01-03 MED FILL — FOLIC ACID 1 MG TABLET: 1 | 30 days supply | Qty: 30 | Fill #0

## 2017-01-03 NOTE — Telephone Encounter (Signed)
Called to request refills, refill sent until additional refills can come from PCP

## 2017-01-09 ENCOUNTER — Ambulatory Visit (INDEPENDENT_AMBULATORY_CARE_PROVIDER_SITE_OTHER): Payer: Self-pay | Admitting: *Deleted

## 2017-01-09 ENCOUNTER — Telehealth (HOSPITAL_COMMUNITY): Payer: Self-pay | Admitting: *Deleted

## 2017-01-09 ENCOUNTER — Other Ambulatory Visit (HOSPITAL_COMMUNITY): Payer: Self-pay

## 2017-01-09 ENCOUNTER — Encounter (INDEPENDENT_AMBULATORY_CARE_PROVIDER_SITE_OTHER): Payer: Self-pay

## 2017-01-09 DIAGNOSIS — I513 Intracardiac thrombosis, not elsewhere classified: Secondary | ICD-10-CM

## 2017-01-09 DIAGNOSIS — Z5181 Encounter for therapeutic drug level monitoring: Secondary | ICD-10-CM

## 2017-01-09 DIAGNOSIS — I428 Other cardiomyopathies: Secondary | ICD-10-CM

## 2017-01-09 DIAGNOSIS — I240 Acute coronary thrombosis not resulting in myocardial infarction: Secondary | ICD-10-CM

## 2017-01-09 LAB — POCT INR: INR: 1.8

## 2017-01-09 MED ORDER — FUROSEMIDE 20 MG PO TABS: 40.0000 mg | ORAL_TABLET | Freq: Every day | ORAL | 3 refills | Status: DC

## 2017-01-09 MED FILL — WARFARIN SODIUM 7.5 MG TAB: 7.5 | 30 days supply | Qty: 45 | Fill #1

## 2017-01-09 NOTE — Telephone Encounter (Signed)
Pt aware and agreeable.  

## 2017-01-09 NOTE — Telephone Encounter (Signed)
Please call and ask him to hold lasix for 2 days then start back lasix 40 mg daily.   Thanks Jyden Kromer

## 2017-01-09 NOTE — Telephone Encounter (Signed)
Advanced Heart Failure Triage Encounter  Patient Name: Robert Booth   Date of Call: 01/09/2017  Problem:  Geraldine Contras with paramedicine called while visiting patient. Patient states he is very dizzy (having to hold on to wall for balance) and having palpitations.  She checked orthostatics 106/80, 104/80, heart rate 76. No new medications. Geraldine Contras said patient is staying hydrated. Patient is scheduled for an appt with APP clinic Monday. Last seen by Amy on 12/12/16.   12/12/2016 ASSESSMENT & PLAN: 1. Chronic systolic HF -NICM  Cath ok performed [place] 2016. 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  - Now has recurrent LV dysfunction after stopping HF meds. Echo 8/17 EF 35% ECHO 10/2016 EF 10% -15% .  - NYHA III. Volume status on the low side with occasional orthostasis.  Cut lasix back to 60 mg daily.  - Continue carvedilol 6.25 mg twice a day. Add 0.125 mg digoxin.  - Increase hydralazine 75 mg three times a day  - Continue imdur 30 mg daily - Continue spiro 25 mg daily  - BMET and BNP today.  2. LV mural thrombus - noted on ECHO 11/2016 On coumadin.  3. H/o ETOH abuse quit 05/25/2014 was drinking 1/5 liquor daily. -  **Message routed to Amy for advice  Plan:    Modesta Messing, CMA

## 2017-01-09 NOTE — Progress Notes (Signed)
Paramedicine Encounter    Patient ID: Robert Booth, male    DOB: 10/25/1970, 46 y.o.   MRN: [id number]    Patient Care Team: Patient, No Pcp Per as PCP - General (General Practice)  Patient Active Problem List   Diagnosis Date Noted  . Acute on chronic combined systolic and diastolic CHF, NYHA class 3 (HCC) 12/01/2016  . Alcoholism (HCC) 12/01/2016  . Uncontrolled hypertension 12/01/2016  . NICM (nonischemic cardiomyopathy) (HCC) 12/01/2016  . Elevated troponin 12/01/2016  . Chronic cough 05/09/2016  . H/O ETOH abuse 12/14/2014  . Chronic systolic heart failure (HCC) 11/24/2014  . Elevated LFTs   . AKI (acute kidney injury) (HCC) 11/09/2014  . Subtherapeutic international normalized ratio (INR)   . Cardiac failure (HCC)   . Positive D dimer   . Anticoagulated on Coumadin 11/08/2014  . Visual disturbance 11/08/2014  . Encounter for therapeutic drug monitoring 10/21/2014  . Congestive dilated cardiomyopathy (HCC)   . History of alcohol abuse   . LV (left ventricular) mural thrombus without MI   . Nonischemic cardiomyopathy (HCC) 08/18/2014  . HTN (hypertension) 08/18/2014    Current Outpatient Prescriptions:  .  carvedilol (COREG) 6.25 MG tablet, Take 1 tablet (6.25 mg total) by mouth 2 (two) times daily with a meal., Disp: 60 tablet, Rfl: 0 .  digoxin (LANOXIN) 0.125 MG tablet, Take 1 tablet (0.125 mg total) by mouth daily., Disp: 90 tablet, Rfl: 3 .  folic acid (FOLVITE) 1 MG tablet, Take 1 tablet (1 mg total) by mouth daily. Additional refills should come from PCP, Disp: 30 tablet, Rfl: 0 .  furosemide (LASIX) 20 MG tablet, Take 3 tablets (60 mg total) by mouth daily., Disp: 90 tablet, Rfl: 3 .  hydrALAZINE (APRESOLINE) 50 MG tablet, Take 1.5 tablets (75 mg total) by mouth 3 (three) times daily., Disp: 135 tablet, Rfl: 3 .  isosorbide mononitrate (IMDUR) 30 MG 24 hr tablet, Take 1 tablet (30 mg total) by mouth daily., Disp: 30 tablet, Rfl: 2 .  Multiple Vitamin  (MULTIVITAMIN WITH MINERALS) TABS tablet, Take 1 tablet by mouth daily., Disp: 30 tablet, Rfl: 0 .  sacubitril-valsartan (ENTRESTO) 24-26 MG, Take 1 tablet by mouth 2 (two) times daily., Disp: 60 tablet, Rfl: 11 .  spironolactone (ALDACTONE) 25 MG tablet, TAKE 1 TABLET BY MOUTH ONCE DAILY, Disp: 34 tablet, Rfl: 2 .  warfarin (COUMADIN) 7.5 MG tablet, Take 11.25mg  (one and half tab) on MWF, take 7.5mg  ( one tab) on TTSS, Disp: 90 tablet, Rfl: 0 Allergies  Allergen Reactions  . Celexa [Citalopram Hydrobromide] Other (See Comments)    DISORIENTED  . Lisinopril Cough     Social History   Social History  . Marital status: Single    Spouse name: N/A  . Number of children: 6  . Years of education: N/A   Occupational History  . Unemployed    Social History Main Topics  . Smoking status: Former Smoker    Types: Cigarettes  . Smokeless tobacco: Former Neurosurgeon    Types: Chew     Comment: Was a rare smoker  . Alcohol use No  . Drug use: No  . Sexual activity: Yes   Other Topics Concern  . Not on file   Social History Narrative   Lives with mom.      Physical Exam  Pulmonary/Chest: No respiratory distress.  Abdominal: He exhibits no distension. There is no tenderness. There is no guarding.  Musculoskeletal: He exhibits no edema.  Skin: Skin is warm and  dry. He is not diaphoretic.        Future Appointments Date Time Provider Department Center  01/13/2017 9:00 AM [facility] PA/NP [facility] None  01/23/2017 7:45 AM [facility]  02/04/2017 9:00 AM Marcine Matar, MD [facility] None    ATF pt CAO x4 sitting in the livingroom c/o palpations and dizziness since yesterday. Pt stated that he was severely dizzy last night.  Pt stated that the palpitations began yesterday and continued throughout the day.  He denies drinking caffenine or changing his diet.  He also denies having sob, diaphoresis and chest pain during the incidents. No ortho static  change when pt stood up. Pt has taken all of his meds without difficulty and he hasn't missed a dose.  He continues to watch his fluid intake and diet.  rx bottles verified and pill box refilled.   Pt stated that the entresto was delivered to him yesterday, therefore he was approved for the financial program until sept 2019.  I advised Jazmine a [facility] clinic of pts symptoms and complaints. She will call me back after speaking with an provider.  BP 106/80   Pulse 76   Resp 16   Wt 184 lb 9.6 oz (83.7 kg)   BMI 27.26 kg/m   Weight yesterday-185 Last visit weight-187  **Warfarin 1.5 thurs/sun and 1 the rest of the days per notes from Muse RN (01/09/17)  **rx called in: isosobide warfarin  Renly Guedes, EMT Paramedic 01/09/2017    ACTION: Home visit completed Next visit planned for next thrusday

## 2017-01-13 ENCOUNTER — Encounter (HOSPITAL_COMMUNITY): Payer: Self-pay

## 2017-01-13 ENCOUNTER — Ambulatory Visit (HOSPITAL_COMMUNITY)
Admission: RE | Admit: 2017-01-13 | Discharge: 2017-01-13 | Disposition: A | Discharge status: Home / Self Care | Payer: Medicaid Other | Source: Ambulatory Visit | Attending: Cardiology | Admitting: Cardiology

## 2017-01-13 ENCOUNTER — Telehealth (HOSPITAL_COMMUNITY): Payer: Self-pay | Admitting: *Deleted

## 2017-01-13 VITALS — BP 112/88 | HR 64 | Wt 192.4 lb

## 2017-01-13 DIAGNOSIS — Z8249 Family history of ischemic heart disease and other diseases of the circulatory system: Secondary | ICD-10-CM | POA: Insufficient documentation

## 2017-01-13 DIAGNOSIS — Z87891 Personal history of nicotine dependence: Secondary | ICD-10-CM | POA: Diagnosis not present

## 2017-01-13 DIAGNOSIS — Z87898 Personal history of other specified conditions: Secondary | ICD-10-CM

## 2017-01-13 DIAGNOSIS — F1011 Alcohol abuse, in remission: Secondary | ICD-10-CM | POA: Diagnosis not present

## 2017-01-13 DIAGNOSIS — I5022 Chronic systolic (congestive) heart failure: Secondary | ICD-10-CM | POA: Diagnosis present

## 2017-01-13 DIAGNOSIS — I429 Cardiomyopathy, unspecified: Secondary | ICD-10-CM | POA: Diagnosis not present

## 2017-01-13 DIAGNOSIS — Z9114 Patient's other noncompliance with medication regimen: Secondary | ICD-10-CM | POA: Insufficient documentation

## 2017-01-13 DIAGNOSIS — N182 Chronic kidney disease, stage 2 (mild): Secondary | ICD-10-CM | POA: Diagnosis not present

## 2017-01-13 DIAGNOSIS — I513 Intracardiac thrombosis, not elsewhere classified: Secondary | ICD-10-CM

## 2017-01-13 DIAGNOSIS — Z79899 Other long term (current) drug therapy: Secondary | ICD-10-CM | POA: Diagnosis not present

## 2017-01-13 DIAGNOSIS — Z7902 Long term (current) use of antithrombotics/antiplatelets: Secondary | ICD-10-CM | POA: Diagnosis not present

## 2017-01-13 DIAGNOSIS — I130 Hypertensive heart and chronic kidney disease with heart failure and stage 1 through stage 4 chronic kidney disease, or unspecified chronic kidney disease: Secondary | ICD-10-CM | POA: Insufficient documentation

## 2017-01-13 DIAGNOSIS — I240 Acute coronary thrombosis not resulting in myocardial infarction: Secondary | ICD-10-CM

## 2017-01-13 DIAGNOSIS — Z9189 Other specified personal risk factors, not elsewhere classified: Secondary | ICD-10-CM

## 2017-01-13 DIAGNOSIS — I428 Other cardiomyopathies: Secondary | ICD-10-CM

## 2017-01-13 LAB — CBC
HEMATOCRIT: 42.1 % (ref 39.0–52.0)
Hemoglobin: 14.6 g/dL (ref 13.0–17.0)
MCH: 30.1 pg (ref 26.0–34.0)
MCHC: 34.7 g/dL (ref 30.0–36.0)
MCV: 86.8 fL (ref 78.0–100.0)
Platelets: 194 10*3/uL (ref 150–400)
RBC: 4.85 MIL/uL (ref 4.22–5.81)
RDW: 13.3 % (ref 11.5–15.5)
WBC: 5.2 10*3/uL (ref 4.0–10.5)

## 2017-01-13 LAB — BASIC METABOLIC PANEL
Anion gap: 8 (ref 5–15)
BUN: 11 mg/dL (ref 6–20)
CO2: 27 mmol/L (ref 22–32)
Calcium: 9.5 mg/dL (ref 8.9–10.3)
Chloride: 104 mmol/L (ref 101–111)
Creatinine, Ser: 1.14 mg/dL (ref 0.61–1.24)
GFR calc Af Amer: >60 mL/min (ref >60)
GFR calc non Af Amer: >60 mL/min (ref >60)
Glucose, Bld: 91 mg/dL (ref 65–99)
Potassium: 4.2 mmol/L (ref 3.5–5.1)
Sodium: 139 mmol/L (ref 135–145)

## 2017-01-13 LAB — DIGOXIN LEVEL: Digoxin Level: 0.5 ng/mL — ABNORMAL LOW (ref 0.8–2.0)

## 2017-01-13 LAB — MAGNESIUM: Magnesium: 2 mg/dL (ref 1.7–2.4)

## 2017-01-13 MED ORDER — LOSARTAN POTASSIUM 25 MG PO TABS: 12.5000 mg | ORAL_TABLET | Freq: Every day | ORAL | 3 refills | Status: DC

## 2017-01-13 MED FILL — LOSARTAN POTASSIUM 25 MG TA: 25 | 34 days supply | Qty: 17 | Fill #0

## 2017-01-13 NOTE — Patient Instructions (Addendum)
Routine lab work today. Will notify you of abnormal results, otherwise no news is good news!  START Losartan 12.5 mg (1/2 tablet) once daily at bedtime.  EKG today.  Will order lifevest. You will be contacted by the company to arrange.  Follow up 2 weeks with Suzzette Righter NP-C.  _______________________________________________________________ [place] Code: 8000  Take all medication as prescribed the day of your appointment. Bring all medications with you to your appointment.  Do the following things EVERYDAY: 1) Weigh yourself in the morning before breakfast. Write it down and keep it in a log. 2) Take your medicines as prescribed 3) Eat low salt foods-Limit salt (sodium) to 2000 mg per day.  4) Stay as active as you can everyday 5) Limit all fluids for the day to less than 2 liters

## 2017-01-13 NOTE — Progress Notes (Signed)
Patient ID: Robert Booth, male   DOB: 10-30-1970, 46 y.o.   MRN: [id number] \  Advanced Heart Failure Clinic Note   [facility]  Primary HF Cardiologist: Dr Gala Romney  HPI: Robert Booth is a 46 y.o. male with hx of systolic HF with NICM secondary to prolonged ETOH abuse, EF 15% 2018, and CKD stage 2.  In 8/16, he presented to [facility] with hemoptysis. Found to have RLL PNA and cardiogenic shock. Treated briefly with milrinone and levophed. Diuresed well. Discharge weight 177 .   EF subsequently improved.   Hospitalized 9/2 through 12/05/2016 with recurrent A/C systolic heart failure in the setting of medication noncompliance. He had been oout of medications for 4 months. Echo with EF 10-15%   Diuresed with IV lasix.  HF meds restarted.   Returns today for HF follow up. He has been keeping track of his weights, weight up about 7 pounds over the past month. By our scales, weight is up about 15 pounds. He denies SOB, orthopnea, PND. Over the past few weeks he has noticed several episodes of dizziness, not when he is changing positions. Usually, he feels palpitations and then gets dizzy and feels like he might pass out. This happens about 3 times a week. Denies chest pain. Taking all medications. Drinking less than 2L a day. Intentional about salt intake.   Echo 10/13/14 EF 20% Echo 12/16 EF 35 - 40%No LV clot Echo 11/30/15 EF 35%  Echo  10/2016 EF 10-15%.   ROS: All systems negative except as listed in HPI, PMH and Problem List.  SH:  Social History   Social History  . Marital status: Single    Spouse name: N/A  . Number of children: 6  . Years of education: N/A   Occupational History  . Unemployed    Social History Main Topics  . Smoking status: Former Smoker    Types: Cigarettes  . Smokeless tobacco: Former Neurosurgeon    Types: Chew     Comment: Was a rare smoker  . Alcohol use No  . Drug use: No  . Sexual activity: Yes   Other Topics Concern  . Not on file    Social History Narrative   Lives with mom.      FH:  Family History  Problem Relation Age of Onset  . Hypertension Mother   . Hypertension Father   . Heart attack Father 37       died  . Hypertension Sister   . Heart attack Paternal Grandfather 50       died    Past Medical History:  Diagnosis Date  . Anxiety   . CHF (congestive heart failure) (HCC)   . Essential hypertension   . History of alcohol abuse    a. Sober since Feb 2016.  . 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 NICM (nonischemic cardiomyopathy) (HCC)    a. 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    Current Outpatient Prescriptions  Medication Sig Dispense Refill  . carvedilol (COREG) 6.25 MG tablet Take 1 tablet (6.25 mg total) by mouth 2 (two) times daily with a meal. 60 tablet 0  . digoxin (LANOXIN) 0.125 MG tablet Take 1 tablet (0.125 mg total) by mouth daily. 90 tablet 3  . folic acid (FOLVITE) 1  MG tablet Take 1 tablet (1 mg total) by mouth daily. Additional refills should come from PCP 30 tablet 0  . furosemide (LASIX) 20 MG tablet Take 2 tablets (40 mg total) by mouth daily. 60 tablet 3  . hydrALAZINE (APRESOLINE) 50 MG tablet Take 1.5 tablets (75 mg total) by mouth 3 (three) times daily. 135 tablet 3  . isosorbide mononitrate (IMDUR) 30 MG 24 hr tablet Take 1 tablet (30 mg total) by mouth daily. 30 tablet 2  . Multiple Vitamin (MULTIVITAMIN WITH MINERALS) TABS tablet Take 1 tablet by mouth daily. 30 tablet 0  . sacubitril-valsartan (ENTRESTO) 24-26 MG Take 1 tablet by mouth 2 (two) times daily. 60 tablet 11  . spironolactone (ALDACTONE) 25 MG tablet TAKE 1 TABLET BY MOUTH ONCE DAILY 34 tablet 2  . warfarin (COUMADIN) 7.5 MG tablet Take 11.25mg  (one and half tab) on MWF, take 7.5mg  ( one tab)  on TTSS 90 tablet 0   No current facility-administered medications for this encounter.     Vitals:   01/13/17 0848  BP: 112/88  Pulse: 64  SpO2: 98%  Weight: 192 lb 6.4 oz (87.3 kg)   Filed Weights   01/13/17 0848  Weight: 192 lb 6.4 oz (87.3 kg)    PHYSICAL EXAM: General: Well appearing. No resp difficulty. HEENT: Normal Neck: Supple. JVP 5-6. Carotids 2+ bilat; no bruits. No thyromegaly or nodule noted. Cor: PMI nondisplaced. RRR, No M/G/R noted Lungs: CTAB, normal effort. Abdomen: Soft, non-tender, non-distended, no HSM. No bruits or masses. +BS  Extremities: No cyanosis, clubbing, rash, R and LLE no edema.  Neuro: Alert & orientedx3, cranial nerves grossly intact. moves all 4 extremities w/o difficulty. Affect pleasant   ASSESSMENT & PLAN: 1. Chronic systolic HF - NICM Cath ok performed [place] 2016. 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 Echo 10/2016 EF 10-15%.  - Now with recurrent LV dysfunction after stopping HF meds. Now back on meds.  - NYHA II - Volume status appears stable, no obvious JVD, no peripheral edema. I do not think his weight is up 17 pounds, suspect the weight last visit was an error as I have reviewed his home daily weights and they are up about 7 pounds. Continue Lasix 40 mg a day. Can take 60 mg if weight > 3 pounds overnight or 5 pounds in a week.  - BMET today.  - Continue Coreg 6.25 mg BID - Continue digoxin 0.125 mg BID. Dig level today.  - Continue Imdur + hydralazine.  - Add 12.5mg  losartan hs.  - Continue Spiro 25 mg   2. LV mural thrombus  - Continue warfarin for anticoagulation  3. H/o ETOH abuse quit 05/25/2014 was drinking 1/5 liquor daily.   4. Suspected VT/VF - Suspect arrhythmia given palpitations, presyncope not associated with position changes. With low EF possible he is having VT/VF.  - Will place LifeVest (he does not have insurance and Holter monitor likely would not be approved). Talked with LifeVest rep, will work on getting  placed in the next 48 hours. His medicaid is pending.  - Check Mg today  BMET, Mg, Dig level today. Follow up in 2 weeks.   Little Ishikawa, NP-C  8:59 AM

## 2017-01-13 NOTE — Addendum Note (Signed)
Encounter addended by: Marcy Siren, LCSW on: 01/13/2017 10:17 AM<BR>    Actions taken: Sign clinical note

## 2017-01-13 NOTE — Progress Notes (Signed)
Paramedicine CSW  Assessment  Robert Booth is enrolled in the [employer] Program through [facility].  The patient presents today in association with an Advanced Heart Failure Clinic Appointment.  Patient seen today by CSW for follow up/assistance on Financial difficulties Health problems.   Social Determinants impacting successful heart failure regimen:  Housing: patient lives with his sister.  Food: Do you have enough food? Yes  Do you know and understand healthy eating and how that affects your heart failure diagnosis? Yes  Do you follow a low salt diet?  Yes  Utilities: Do you have gas and/or electricity on in your home? Yes  Income: What is your source of income? No income at the moment- pending SSI application with [facility] assisting with process. [name] from [place] [facility] assisting and her number is [phone] Insurance: pending medicaid Transportation: Do you have transportation to your medical appointments?Yes  If yes, how? Patient reports he takes the bus when his mother is unable to transport him to appointments.    Daily Health Needs: Do you have a scale and weigh each day?  Yes  Are you able to adhere to your medication regimen? Yes  Do you ever take medications differently than prescribed? No  Do you know the zones of Heart Failure?  Yes  Do you know how to contact the HF Clinic appropriately with worsening symptoms or weight increases?  Yes   Do you have an Advanced Directive?  No  If not, would you like to complete?  Do you have any identified obstacles / challenges for adherence to current treatment plan?Yes  Financial needs  Patient appears to have good family support from his mother and sister although challenged by financial issues. Patient has a pending SSI/Mediciad application and working closely with [facility] and hopeful for approval within the next month.   CSW assisted patient with [employer] and Support[facility]ing with the goal to Increase healthy adjustment to current life circumstances  [facility] will continue to coordinate and monitor patient's treatment plan. CSW continues to assist with follow up regarding disability and any other needs as identified. [name], LCSW, CCSW-MCS [phone]

## 2017-01-13 NOTE — Telephone Encounter (Signed)
Insurance  FYI: Patient is medicaid pending.

## 2017-01-14 ENCOUNTER — Telehealth: Payer: Self-pay | Admitting: Licensed Clinical Social Worker

## 2017-01-14 NOTE — Telephone Encounter (Signed)
CSW followed up with [facility] regarding pending SSI application and confirmed in process. [facility] suggested contacting Financial Counseling to confirm medicaid and CSW left message. Patient returned call stating he has disability determination worker name Caralee Ates [phone] in [place]. CSW attempted to contact although message left. [name], LCSW, CCSW-MCS [phone]

## 2017-01-16 ENCOUNTER — Other Ambulatory Visit (HOSPITAL_COMMUNITY): Payer: Self-pay

## 2017-01-16 MED FILL — ISOSORBIDE MN ER 30 MG TAB: 30 | 34 days supply | Qty: 34 | Fill #0

## 2017-01-16 MED FILL — FUROSEMIDE 20 MG TABLET: 20 | 33 days supply | Qty: 100 | Fill #0

## 2017-01-16 NOTE — Progress Notes (Signed)
Paramedicine Encounter    Patient ID: Robert Booth, male    DOB: 04-07-1970, 46 y.o.   MRN: [id number]    Patient Care Team: Patient, No Pcp Per as PCP - General (General Practice)  Patient Active Problem List   Diagnosis Date Noted  . Acute on chronic combined systolic and diastolic CHF, NYHA class 3 (HCC) 12/01/2016  . Alcoholism (HCC) 12/01/2016  . Uncontrolled hypertension 12/01/2016  . NICM (nonischemic cardiomyopathy) (HCC) 12/01/2016  . Elevated troponin 12/01/2016  . Chronic cough 05/09/2016  . H/O ETOH abuse 12/14/2014  . Chronic systolic heart failure (HCC) 11/24/2014  . Elevated LFTs   . AKI (acute kidney injury) (HCC) 11/09/2014  . Subtherapeutic international normalized ratio (INR)   . Cardiac failure (HCC)   . Positive D dimer   . Anticoagulated on Coumadin 11/08/2014  . Visual disturbance 11/08/2014  . Encounter for therapeutic drug monitoring 10/21/2014  . Congestive dilated cardiomyopathy (HCC)   . History of alcohol abuse   . LV (left ventricular) mural thrombus without MI   . Nonischemic cardiomyopathy (HCC) 08/18/2014  . HTN (hypertension) 08/18/2014    Current Outpatient Prescriptions:  .  carvedilol (COREG) 6.25 MG tablet, Take 1 tablet (6.25 mg total) by mouth 2 (two) times daily with a meal., Disp: 60 tablet, Rfl: 0 .  digoxin (LANOXIN) 0.125 MG tablet, Take 1 tablet (0.125 mg total) by mouth daily., Disp: 90 tablet, Rfl: 3 .  folic acid (FOLVITE) 1 MG tablet, Take 1 tablet (1 mg total) by mouth daily. Additional refills should come from PCP, Disp: 30 tablet, Rfl: 0 .  furosemide (LASIX) 20 MG tablet, Take 2 tablets (40 mg total) by mouth daily., Disp: 60 tablet, Rfl: 3 .  hydrALAZINE (APRESOLINE) 50 MG tablet, Take 1.5 tablets (75 mg total) by mouth 3 (three) times daily., Disp: 135 tablet, Rfl: 3 .  isosorbide mononitrate (IMDUR) 30 MG 24 hr tablet, Take 1 tablet (30 mg total) by mouth daily., Disp: 30 tablet, Rfl: 2 .  losartan (COZAAR) 25 MG  tablet, Take 0.5 tablets (12.5 mg total) by mouth daily., Disp: 45 tablet, Rfl: 3 .  Multiple Vitamin (MULTIVITAMIN WITH MINERALS) TABS tablet, Take 1 tablet by mouth daily., Disp: 30 tablet, Rfl: 0 .  sacubitril-valsartan (ENTRESTO) 24-26 MG, Take 1 tablet by mouth 2 (two) times daily., Disp: 60 tablet, Rfl: 11 .  spironolactone (ALDACTONE) 25 MG tablet, TAKE 1 TABLET BY MOUTH ONCE DAILY, Disp: 34 tablet, Rfl: 2 .  warfarin (COUMADIN) 7.5 MG tablet, Take 11.25mg  (one and half tab) on MWF, take 7.5mg  ( one tab) on TTSS, Disp: 90 tablet, Rfl: 0 Allergies  Allergen Reactions  . Celexa [Citalopram Hydrobromide] Other (See Comments)    DISORIENTED  . Lisinopril Cough     Social History   Social History  . Marital status: Single    Spouse name: N/A  . Number of children: 6  . Years of education: N/A   Occupational History  . Unemployed    Social History Main Topics  . Smoking status: Former Smoker    Types: Cigarettes  . Smokeless tobacco: Former Neurosurgeon    Types: Chew     Comment: Was a rare smoker  . Alcohol use No  . Drug use: No  . Sexual activity: Yes   Other Topics Concern  . Not on file   Social History Narrative   Lives with mom.      Physical Exam  Pulmonary/Chest: No respiratory distress. He has no wheezes. He  has no rales.  Abdominal: He exhibits no distension. There is no tenderness. There is no guarding.  Musculoskeletal: He exhibits no edema.  Skin: Skin is warm and dry. He is not diaphoretic.        Future Appointments Date Time Provider Department Center  01/23/2017 7:45 AM [facility]  01/27/2017 8:30 AM [facility] PA/NP [facility] None  02/04/2017 9:00 AM Marcine MatarJohnson, Deborah B, MD [facility] None    ATF pt CAO x4 talking on the phone with no complaints today.  Pt advised me that he is now on a heart monitor and during his visit on Monday he was advised that he has periods of apnea while sleeping.  Pt is very concerned  about possibly having sleep apena.  Annice PihJackie has spoken with someone in [place] about his pending medicaid application.  Pt will need a sleep study per his last visit.  Pt became dizzy once yesterday and he sat down, the dizziness went away.  He filled his pill box himself this week and didn't have any mistakes.  Pt is still watching his diet and fluid intake.  rx bottles verified and pill box refilled.   BP 116/76   Pulse (!) 58   Resp 16   Wt 188 lb 3.2 oz (85.4 kg)   SpO2 98%   BMI 27.79 kg/m   Weight yesterday-186.6 Last visit weight-184.6    Rashawn Rayman, EMT Paramedic 01/16/2017    ACTION: Home visit completed

## 2017-01-21 ENCOUNTER — Telehealth: Payer: Self-pay | Admitting: Licensed Clinical Social Worker

## 2017-01-21 NOTE — Telephone Encounter (Signed)
CSW received request to assist patient with navigating the disability journey. Patient received confirmation of his medicaid approval although then a few days later received a letter from [facility] [phone] stating that he would need to provide proof of SSI application pending in order to maintain his medicaid benefits. Patient states he has provided the information of pending SSI application and he is being assisted by the [facility] to his Careers information officer. Patient has received multiple letters from [facility] and DSS regarding his status although is confused as some state approved and others state pending awaiting additional information. CSW attempted to explain letters and assisted with message left for caseworker. Patient grateful for assistance and will return call to CSW if further letters received. CSW will continue to follow through the [employer] for care coordination and any other needs as they arise. [name], LCSW, CCSW-MCS [phone]

## 2017-01-23 ENCOUNTER — Other Ambulatory Visit (HOSPITAL_COMMUNITY): Payer: Self-pay

## 2017-01-23 ENCOUNTER — Ambulatory Visit (INDEPENDENT_AMBULATORY_CARE_PROVIDER_SITE_OTHER): Payer: Medicaid Other | Admitting: *Deleted

## 2017-01-23 DIAGNOSIS — I428 Other cardiomyopathies: Secondary | ICD-10-CM | POA: Diagnosis not present

## 2017-01-23 DIAGNOSIS — I513 Intracardiac thrombosis, not elsewhere classified: Secondary | ICD-10-CM

## 2017-01-23 DIAGNOSIS — Z5181 Encounter for therapeutic drug level monitoring: Secondary | ICD-10-CM | POA: Diagnosis not present

## 2017-01-23 DIAGNOSIS — I240 Acute coronary thrombosis not resulting in myocardial infarction: Secondary | ICD-10-CM

## 2017-01-23 LAB — POCT INR: INR: 2.3

## 2017-01-23 NOTE — Progress Notes (Signed)
Paramedicine Encounter    Patient ID: Robert Booth, male    DOB: 08-21-1970, 46 y.o.   MRN: [id number]    Patient Care Team: Patient, No Pcp Per as PCP - General (General Practice)  Patient Active Problem List   Diagnosis Date Noted  . Acute on chronic combined systolic and diastolic CHF, NYHA class 3 (HCC) 12/01/2016  . Alcoholism (HCC) 12/01/2016  . Uncontrolled hypertension 12/01/2016  . NICM (nonischemic cardiomyopathy) (HCC) 12/01/2016  . Elevated troponin 12/01/2016  . Chronic cough 05/09/2016  . H/O ETOH abuse 12/14/2014  . Chronic systolic heart failure (HCC) 11/24/2014  . Elevated LFTs   . AKI (acute kidney injury) (HCC) 11/09/2014  . Subtherapeutic international normalized ratio (INR)   . Cardiac failure (HCC)   . Positive D dimer   . Anticoagulated on Coumadin 11/08/2014  . Visual disturbance 11/08/2014  . Encounter for therapeutic drug monitoring 10/21/2014  . Congestive dilated cardiomyopathy (HCC)   . History of alcohol abuse   . LV (left ventricular) mural thrombus without MI   . Nonischemic cardiomyopathy (HCC) 08/18/2014  . HTN (hypertension) 08/18/2014    Current Outpatient Prescriptions:  .  carvedilol (COREG) 6.25 MG tablet, Take 1 tablet (6.25 mg total) by mouth 2 (two) times daily with a meal., Disp: 60 tablet, Rfl: 0 .  digoxin (LANOXIN) 0.125 MG tablet, Take 1 tablet (0.125 mg total) by mouth daily., Disp: 90 tablet, Rfl: 3 .  folic acid (FOLVITE) 1 MG tablet, Take 1 tablet (1 mg total) by mouth daily. Additional refills should come from PCP, Disp: 30 tablet, Rfl: 0 .  furosemide (LASIX) 20 MG tablet, Take 2 tablets (40 mg total) by mouth daily., Disp: 60 tablet, Rfl: 3 .  hydrALAZINE (APRESOLINE) 50 MG tablet, Take 1.5 tablets (75 mg total) by mouth 3 (three) times daily., Disp: 135 tablet, Rfl: 3 .  isosorbide mononitrate (IMDUR) 30 MG 24 hr tablet, Take 1 tablet (30 mg total) by mouth daily., Disp: 30 tablet, Rfl: 2 .  losartan (COZAAR) 25 MG  tablet, Take 0.5 tablets (12.5 mg total) by mouth daily., Disp: 45 tablet, Rfl: 3 .  Multiple Vitamin (MULTIVITAMIN WITH MINERALS) TABS tablet, Take 1 tablet by mouth daily., Disp: 30 tablet, Rfl: 0 .  sacubitril-valsartan (ENTRESTO) 24-26 MG, Take 1 tablet by mouth 2 (two) times daily., Disp: 60 tablet, Rfl: 11 .  spironolactone (ALDACTONE) 25 MG tablet, TAKE 1 TABLET BY MOUTH ONCE DAILY, Disp: 34 tablet, Rfl: 2 .  warfarin (COUMADIN) 7.5 MG tablet, Take 11.25mg  (one and half tab) on MWF, take 7.5mg  ( one tab) on TTSS, Disp: 90 tablet, Rfl: 0 Allergies  Allergen Reactions  . Celexa [Citalopram Hydrobromide] Other (See Comments)    DISORIENTED  . Lisinopril Cough     Social History   Social History  . Marital status: Single    Spouse name: N/A  . Number of children: 6  . Years of education: N/A   Occupational History  . Unemployed    Social History Main Topics  . Smoking status: Former Smoker    Types: Cigarettes  . Smokeless tobacco: Former Neurosurgeon    Types: Chew     Comment: Was a rare smoker  . Alcohol use No  . Drug use: No  . Sexual activity: Yes   Other Topics Concern  . Not on file   Social History Narrative   Lives with mom.      Physical Exam  Abdominal: He exhibits no distension. There is no tenderness.  There is no guarding.  Musculoskeletal: He exhibits no edema.  Skin: Skin is warm and dry. He is not diaphoretic.        Future Appointments Date Time Provider Department Center  01/27/2017 8:30 AM [facility] PA/NP [facility] None  02/04/2017 9:00 AM Marcine MatarJohnson, Deborah B, MD [facility] None  02/06/2017 7:45 AM [facility]    ATF pt CAO x4 sitting in the living room talking with his sister and mother.  Pt stated that he has had no changes within the past week.  Pt stated that he still feel tired in the morning because he wakes up throughout the night.  Pt has taken all of his meds for this week; hasn't missed a dose.  He  continues to eat heart healthy foods and is limiting his fluid intake.  rx bottles verified and pill box refilled.   Pt's paperwork picked up today from [employer]; I will take them to him on our next  Visit.     ffBP 118/78   Pulse (!) 56   Resp 16   Wt 189 lb 9.6 oz (86 kg)   SpO2 98%   BMI 28.00 kg/m     **rx called in: Spirolactone  Weight yesterday-189  Last visit weight-188    Robert Booth, EMT Paramedic 01/23/2017    ACTION: Home visit completed Next visit planned for next thursday

## 2017-01-27 ENCOUNTER — Encounter (HOSPITAL_COMMUNITY): Payer: Self-pay | Admitting: *Deleted

## 2017-01-27 ENCOUNTER — Ambulatory Visit (HOSPITAL_COMMUNITY)
Admission: RE | Admit: 2017-01-27 | Discharge: 2017-01-27 | Disposition: A | Discharge status: Home / Self Care | Payer: Medicaid Other | Source: Ambulatory Visit | Attending: Cardiology | Admitting: Cardiology

## 2017-01-27 ENCOUNTER — Encounter (HOSPITAL_COMMUNITY): Payer: Self-pay

## 2017-01-27 VITALS — BP 110/80 | HR 73 | Wt 194.0 lb

## 2017-01-27 DIAGNOSIS — Z5181 Encounter for therapeutic drug level monitoring: Secondary | ICD-10-CM | POA: Diagnosis not present

## 2017-01-27 DIAGNOSIS — F1011 Alcohol abuse, in remission: Secondary | ICD-10-CM

## 2017-01-27 DIAGNOSIS — I130 Hypertensive heart and chronic kidney disease with heart failure and stage 1 through stage 4 chronic kidney disease, or unspecified chronic kidney disease: Secondary | ICD-10-CM | POA: Diagnosis not present

## 2017-01-27 DIAGNOSIS — Z7901 Long term (current) use of anticoagulants: Secondary | ICD-10-CM | POA: Insufficient documentation

## 2017-01-27 DIAGNOSIS — I5022 Chronic systolic (congestive) heart failure: Secondary | ICD-10-CM | POA: Diagnosis not present

## 2017-01-27 DIAGNOSIS — F419 Anxiety disorder, unspecified: Secondary | ICD-10-CM | POA: Diagnosis not present

## 2017-01-27 DIAGNOSIS — N182 Chronic kidney disease, stage 2 (mild): Secondary | ICD-10-CM | POA: Diagnosis not present

## 2017-01-27 DIAGNOSIS — Z79899 Other long term (current) drug therapy: Secondary | ICD-10-CM | POA: Diagnosis not present

## 2017-01-27 DIAGNOSIS — Z87898 Personal history of other specified conditions: Secondary | ICD-10-CM

## 2017-01-27 DIAGNOSIS — I428 Other cardiomyopathies: Secondary | ICD-10-CM | POA: Diagnosis not present

## 2017-01-27 DIAGNOSIS — I513 Intracardiac thrombosis, not elsewhere classified: Secondary | ICD-10-CM | POA: Insufficient documentation

## 2017-01-27 DIAGNOSIS — I240 Acute coronary thrombosis not resulting in myocardial infarction: Secondary | ICD-10-CM

## 2017-01-27 DIAGNOSIS — Z9189 Other specified personal risk factors, not elsewhere classified: Secondary | ICD-10-CM | POA: Diagnosis not present

## 2017-01-27 DIAGNOSIS — Z87891 Personal history of nicotine dependence: Secondary | ICD-10-CM | POA: Insufficient documentation

## 2017-01-27 DIAGNOSIS — I10 Essential (primary) hypertension: Secondary | ICD-10-CM | POA: Diagnosis not present

## 2017-01-27 LAB — BASIC METABOLIC PANEL
Anion gap: 11 (ref 5–15)
BUN: 14 mg/dL (ref 6–20)
CHLORIDE: 101 mmol/L (ref 101–111)
CO2: 26 mmol/L (ref 22–32)
Calcium: 9.8 mg/dL (ref 8.9–10.3)
Creatinine, Ser: 1.2 mg/dL (ref 0.61–1.24)
GFR calc Af Amer: >60 mL/min (ref >60)
GFR calc non Af Amer: >60 mL/min (ref >60)
GLUCOSE: 87 mg/dL (ref 65–99)
POTASSIUM: 4.3 mmol/L (ref 3.5–5.1)
Sodium: 138 mmol/L (ref 135–145)

## 2017-01-27 NOTE — Patient Instructions (Signed)
Labs today (will call for abnormal results, otherwise no news is good news)  STOP Losartan  Echocardiogram and follow up in 5-6 weeks.

## 2017-01-27 NOTE — Progress Notes (Signed)
Patient ID: Robert Booth, male   DOB: 1970/07/06, 46 y.o.   MRN: [id number] \  Advanced Heart Failure Clinic Note   PCP: [facility]  Primary HF Cardiologist: Dr Gala RomneyBensimhon  HPI: Robert Booth is a 46 y.o. male with hx of systolic HF with NICM secondary to prolonged ETOH abuse, EF 15% 2018, and CKD stage 2.  In 8/16, [facility]. Found to have RLL PNA and cardiogenic shock. Treated briefly with milrinone and levophed. Diuresed well. Discharge weight 177 .   EF subsequently improved.   Hospitalized 9/2 through 12/05/2016 with recurrent A/C systolic heart failure in the setting of medication noncompliance. He had been oout of medications for 4 months. Echo with EF 10-15%   Diuresed with IV lasix.  HF meds restarted.   Returns today for HF follow up. Last visit, he described palpitations, dizziness, presyncope so LifeVest was placed as there is concern for VT/VF with low EF. Today, he feels well. Denies SOB with walking in stores, no SOB with stairs. He is taking all medications, continues to abstain from ETOH. Wearing LifeVest, reviewed data from vest, no VT/VF. Denies chest pain, orthopnea.   Echo 10/13/14 EF 20% Echo 12/16 EF 35 - 40%No LV clot Echo 11/30/15 EF 35%  Echo  11/2016 EF 10-15%.   ROS: All systems negative except as listed in HPI, PMH and Problem List.  SH:  Social History   Social History  . Marital status: Single    Spouse name: N/A  . Number of children: 6  . Years of education: N/A   Occupational History  . Unemployed    Social History Main Topics  . Smoking status: Former Smoker    Types: Cigarettes  . Smokeless tobacco: Former NeurosurgeonUser    Types: Chew     Comment: Was a rare smoker  . Alcohol use No  . Drug use: No  . Sexual activity: Yes   Other Topics Concern  . Not on file   Social History Narrative   Lives with mom.      FH:  Family History  Problem Relation Age of Onset  . Hypertension Mother   .  Hypertension Father   . Heart attack Father [id number]       died  . Hypertension Sister   . Heart attack Paternal Grandfather 50       died    Past Medical History:  Diagnosis Date  . Anxiety   . CHF (congestive heart failure) (HCC)   . Essential hypertension   . History of alcohol abuse    a. Sober since Feb 2016.  . LV (left ventricular) mural thrombus 07/2014   Documented n [place]  . NICM (nonischemic cardiomyopathy) (HCC)    a. 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    Current Outpatient Prescriptions  Medication Sig Dispense Refill  . carvedilol (COREG) 6.25 MG tablet Take 1 tablet (6.25 mg total) by mouth 2 (two) times daily with a meal. 60 tablet 0  . digoxin (LANOXIN) 0.125 MG tablet Take 1 tablet (0.125 mg total) by mouth daily. 90 tablet 3  . folic acid (FOLVITE) 1 MG tablet Take 1 tablet (1 mg total) by mouth daily. Additional refills should come from PCP 30 tablet 0  . furosemide (LASIX) 20 MG tablet Take 2 tablets (40  mg total) by mouth daily. 60 tablet 3  . hydrALAZINE (APRESOLINE) 50 MG tablet Take 1.5 tablets (75 mg total) by mouth 3 (three) times daily. 135 tablet 3  . isosorbide mononitrate (IMDUR) 30 MG 24 hr tablet Take 1 tablet (30 mg total) by mouth daily. 30 tablet 2  . losartan (COZAAR) 25 MG tablet Take 0.5 tablets (12.5 mg total) by mouth daily. 45 tablet 3  . Multiple Vitamin (MULTIVITAMIN WITH MINERALS) TABS tablet Take 1 tablet by mouth daily. 30 tablet 0  . sacubitril-valsartan (ENTRESTO) 24-26 MG Take 1 tablet by mouth 2 (two) times daily. 60 tablet 11  . spironolactone (ALDACTONE) 25 MG tablet TAKE 1 TABLET BY MOUTH ONCE DAILY 34 tablet 2  . warfarin (COUMADIN) 7.5 MG tablet Take 11.25mg  (one and half tab) on MWF, take 7.5mg  ( one tab) on TTSS 90 tablet 0   No current  facility-administered medications for this encounter.     Vitals:   01/27/17 0834  BP: 110/80  Pulse: 73  SpO2: 98%  Weight: 194 lb (88 kg)   Filed Weights   01/27/17 0834  Weight: 194 lb (88 kg)    PHYSICAL EXAM: General: Well appearing. No resp difficulty. HEENT: Normal Neck: Supple. JVP 5-6. Carotids 2+ bilat; no bruits. No thyromegaly or nodule noted. Cor: PMI nondisplaced. RRR, No M/G/R noted. LifeVest in place.  Lungs: CTAB, normal effort. Abdomen: Soft, non-tender, non-distended, no HSM. No bruits or masses. +BS  Extremities: No cyanosis, clubbing, or rash. R and LLE no edema.  Neuro: Alert & orientedx3, cranial nerves grossly intact. moves all 4 extremities w/o difficulty. Affect pleasant    ASSESSMENT & PLAN: 1. Chronic systolic HF - NICM Cath ok performed [place] 2016. 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 Echo 10/2016 EF 10-15%. Now with recurrent LV dysfunction after stopping HF meds. Now back on meds.  - NYHA II - Volume stable on exam. Weight stable at home 190-192 pounds.  - Continue Lasix 40 mg daily. Discussed adding an extra lasix if weight increases by 3 pounds overnight or 5 pounds in one week.  - Continue Coreg 6.25 mg BID.  - Continue digoxin 0.125 mg daily. Recent dig level ok.  - Continue hydralazine + Imdur.  - Continue Entresto 24/26 mg BID.  - Continue Spiro 25 mg daily.  - BMET today.   2. LV mural thrombus  - Continue warfarin for anticoagulation.   3. H/o ETOH abuse quit 05/25/2014 was drinking 1/5 liquor daily.  - No longer drinking.   4. Suspected VT/VF - Continue LifeVest, so far no arrhythmia.   BMET today. He is due for an Echo in Dec. Will have him do Echo and an appt. With Dr. Gala Romney. He will wear LifeVest until then, if EF remains <35% will need referral for ICD.   Little Ishikawa, NP-C  8:38 AM

## 2017-01-30 ENCOUNTER — Other Ambulatory Visit (HOSPITAL_COMMUNITY): Payer: Self-pay | Admitting: Internal Medicine

## 2017-01-30 MED FILL — CARVEDILOL 6.25 MG TABLET: 6.25 | 34 days supply | Qty: 68 | Fill #1

## 2017-01-30 MED FILL — hydrALAZINE HCL 50 MG TABS: 50 | 34 days supply | Qty: 153 | Fill #1

## 2017-01-31 MED FILL — SPIRONOLACTONE 25 MG TABLET: 25 | 34 days supply | Qty: 34 | Fill #0

## 2017-02-04 ENCOUNTER — Encounter: Payer: Self-pay | Admitting: Internal Medicine

## 2017-02-04 ENCOUNTER — Ambulatory Visit: Payer: Medicaid Other | Attending: Internal Medicine | Admitting: Internal Medicine

## 2017-02-04 VITALS — BP 106/72 | HR 76 | Temp 98.2°F | Resp 16 | Wt 197.0 lb

## 2017-02-04 DIAGNOSIS — F1020 Alcohol dependence, uncomplicated: Secondary | ICD-10-CM | POA: Diagnosis not present

## 2017-02-04 DIAGNOSIS — I428 Other cardiomyopathies: Secondary | ICD-10-CM | POA: Insufficient documentation

## 2017-02-04 DIAGNOSIS — I420 Dilated cardiomyopathy: Secondary | ICD-10-CM | POA: Insufficient documentation

## 2017-02-04 DIAGNOSIS — Z79899 Other long term (current) drug therapy: Secondary | ICD-10-CM | POA: Diagnosis not present

## 2017-02-04 DIAGNOSIS — I513 Intracardiac thrombosis, not elsewhere classified: Secondary | ICD-10-CM

## 2017-02-04 DIAGNOSIS — I5022 Chronic systolic (congestive) heart failure: Secondary | ICD-10-CM | POA: Diagnosis not present

## 2017-02-04 DIAGNOSIS — I10 Essential (primary) hypertension: Secondary | ICD-10-CM

## 2017-02-04 DIAGNOSIS — I240 Acute coronary thrombosis not resulting in myocardial infarction: Secondary | ICD-10-CM

## 2017-02-04 DIAGNOSIS — R7989 Other specified abnormal findings of blood chemistry: Secondary | ICD-10-CM | POA: Diagnosis not present

## 2017-02-04 DIAGNOSIS — Z87891 Personal history of nicotine dependence: Secondary | ICD-10-CM | POA: Diagnosis not present

## 2017-02-04 DIAGNOSIS — Z7901 Long term (current) use of anticoagulants: Secondary | ICD-10-CM | POA: Insufficient documentation

## 2017-02-04 DIAGNOSIS — Z2821 Immunization not carried out because of patient refusal: Secondary | ICD-10-CM | POA: Diagnosis not present

## 2017-02-04 DIAGNOSIS — R945 Abnormal results of liver function studies: Secondary | ICD-10-CM

## 2017-02-04 DIAGNOSIS — I110 Hypertensive heart disease with heart failure: Secondary | ICD-10-CM | POA: Insufficient documentation

## 2017-02-04 NOTE — Patient Instructions (Signed)
Continue your current medications.  Continue to limit your salt intake.   -Keep your follow-up appointment with a cardiologist.   -Watch out for any bruising or bleeding while you are on the blood thinner.

## 2017-02-04 NOTE — Progress Notes (Signed)
Patient ID: Robert Booth, male    DOB: [birth year]  MRN: [id number]  CC:   Subjective: Robert Booth is a 46 y.o. male who presents for chronic ds management His concerns today include:  Patient with history of NICM with chronic systolic CHF with EF 15% thought to be due to past hx of heavy ETOH, HTN, left mural thrombus,  Patient seen by PA 11/2016 posthospitalization for CHF exacerbation.  Up to that point patient had been out of his medications for 4 months.  His Medicaid has been reinstated and he is now able to get his medications.  1. CHF/ LV thrombus/HTN:  Saw cardiology since last visit -compliant with meds Limits salt - endorses SOB with shower or if he walks more than 100 yards. No LE edema. Occasionally he has to lay propped up on pillows.  No PND.  -wearing Lifevest [facility] monitoring Coumadin. No bruising or bleeding.   2. ETOH: clean since 05/2014 LFTs abn 2 mths ago but improved from 2016.   3. HM: declines flu shot  Patient Active Problem List   Diagnosis Date Noted  . Acute on chronic combined systolic and diastolic CHF, NYHA class 3 (HCC) 12/01/2016  . Alcoholism (HCC) 12/01/2016  . Uncontrolled hypertension 12/01/2016  . NICM (nonischemic cardiomyopathy) (HCC) 12/01/2016  . Elevated troponin 12/01/2016  . Chronic cough 05/09/2016  . H/O ETOH abuse 12/14/2014  . Chronic systolic heart failure (HCC) 11/24/2014  . Elevated LFTs   . AKI (acute kidney injury) (HCC) 11/09/2014  . Subtherapeutic international normalized ratio (INR)   . Cardiac failure (HCC)   . Positive D dimer   . Anticoagulated on Coumadin 11/08/2014  . Visual disturbance 11/08/2014  . Encounter for therapeutic drug monitoring 10/21/2014  . Congestive dilated cardiomyopathy (HCC)   . History of alcohol abuse   . LV (left ventricular) mural thrombus without MI   . Nonischemic cardiomyopathy (HCC) 08/18/2014  . HTN (hypertension) 08/18/2014     Current Outpatient Medications  on File Prior to Visit  Medication Sig Dispense Refill  . carvedilol (COREG) 6.25 MG tablet Take 1 tablet (6.25 mg total) by mouth 2 (two) times daily with a meal. 60 tablet 0  . digoxin (LANOXIN) 0.125 MG tablet Take 1 tablet (0.125 mg total) by mouth daily. 90 tablet 3  . folic acid (FOLVITE) 1 MG tablet Take 1 tablet (1 mg total) by mouth daily. Additional refills should come from PCP 30 tablet 0  . furosemide (LASIX) 20 MG tablet Take 2 tablets (40 mg total) by mouth daily. 60 tablet 3  . hydrALAZINE (APRESOLINE) 50 MG tablet Take 1.5 tablets (75 mg total) by mouth 3 (three) times daily. 135 tablet 3  . isosorbide mononitrate (IMDUR) 30 MG 24 hr tablet Take 1 tablet (30 mg total) by mouth daily. 30 tablet 2  . Multiple Vitamin (MULTIVITAMIN WITH MINERALS) TABS tablet Take 1 tablet by mouth daily. 30 tablet 0  . sacubitril-valsartan (ENTRESTO) 24-26 MG Take 1 tablet by mouth 2 (two) times daily. 60 tablet 11  . spironolactone (ALDACTONE) 25 MG tablet TAKE 1 TABLET BY MOUTH ONCE DAILY 34 tablet 2  . warfarin (COUMADIN) 7.5 MG tablet Take 11.25mg  (one and half tab) on MWF, take 7.5mg  ( one tab) on TTSS 90 tablet 0   No current facility-administered medications on file prior to visit.     Allergies  Allergen Reactions  . Celexa [Citalopram Hydrobromide] Other (See Comments)    DISORIENTED  . Lisinopril Cough  Social History   Socioeconomic History  . Marital status: Single    Spouse name: Not on file  . Number of children: 6  . Years of education: Not on file  . Highest education level: Not on file  Social Needs  . Financial resource strain: Not on file  . Food insecurity - worry: Not on file  . Food insecurity - inability: Not on file  . Transportation needs - medical: Not on file  . Transportation needs - non-medical: Not on file  Occupational History  . Occupation: Unemployed  Tobacco Use  . Smoking status: Former Smoker    Types: Cigarettes  . Smokeless tobacco: Former  Neurosurgeon    Types: Chew  . Tobacco comment: Was a rare smoker  Substance and Sexual Activity  . Alcohol use: No    Alcohol/week: 0.0 oz  . Drug use: No  . Sexual activity: Yes  Other Topics Concern  . Not on file  Social History Narrative   Lives with mom.      Family History  Problem Relation Age of Onset  . Hypertension Mother   . Hypertension Father   . Heart attack Father 71       died  . Hypertension Sister   . Heart attack Paternal Grandfather 50       died    Past Surgical History:  Procedure Laterality Date  . CARDIAC CATHETERIZATION  05/2014   In [place], clean cors  . HERNIA REPAIR Right Inguinal  . KNEE SURGERY Right 1998    ROS: Review of Systems Negative except as stated above PHYSICAL EXAM: BP 106/72   Pulse 76   Temp 98.2 F (36.8 C) (Oral)   Resp 16   Wt 197 lb (89.4 kg)   SpO2 96%   BMI 29.09 kg/m   Wt Readings from Last 3 Encounters:  02/04/17 197 lb (89.4 kg)  01/27/17 194 lb (88 kg)  01/23/17 189 lb 9.6 oz (86 kg)   Physical Exam General appearance - alert, well appearing, middle-aged African-American male and in no distress Mental status - alert, oriented to person, place, and time, normal mood, behavior, speech, dress, motor activity, and thought processes Eyes - pupils equal and reactive, extraocular eye movements intact Mouth - mucous membranes moist, pharynx normal without lesions Neck - supple, no significant adenopathy Chest - clear to auscultation, no wheezes, rales or rhonchi, symmetric air entry Heart - normal rate, regular rhythm, normal S1, S2, no murmurs, rubs, clicks or gallops. Wearing LifeVest Extremities - peripheral pulses normal, no pedal edema, no clubbing or cyanosis    Chemistry      Component Value Date/Time   NA 138 01/27/2017 0848   K 4.3 01/27/2017 0848   CL 101 01/27/2017 0848   CO2 26 01/27/2017 0848   BUN 14 01/27/2017 0848   CREATININE 1.20 01/27/2017 0848   CREATININE 1.39 (H) 11/08/2014 1236        Component Value Date/Time   CALCIUM 9.8 01/27/2017 0848   ALKPHOS 73 12/03/2016 0456   AST 53 (H) 12/03/2016 0456   ALT 50 12/03/2016 0456   BILITOT 1.5 (H) 12/03/2016 0456      ASSESSMENT AND PLAN: 1. Chronic systolic heart failure (HCC) 2. LV (left ventricular) mural thrombus without MI -compensated at this time -cont current meds and f/u with cardiology. May need defibrilator if heart function does not improve.  3. Essential hypertension -at goal. Cont current meds  4. Abnormal LFTs -will repeat when he has blood  work again  5. Influenza vaccination declined   Patient was given the opportunity to ask questions.  Patient verbalized understanding of the plan and was able to repeat key elements of the plan.   No orders of the defined types were placed in this encounter.    Requested Prescriptions    No prescriptions requested or ordered in this encounter    Return in about 3 months (around 05/07/2017).  Jonah Blueeborah Raynetta Osterloh, MD, FACP

## 2017-02-05 ENCOUNTER — Other Ambulatory Visit (HOSPITAL_COMMUNITY): Payer: Self-pay | Admitting: Internal Medicine

## 2017-02-06 ENCOUNTER — Other Ambulatory Visit (HOSPITAL_COMMUNITY): Payer: Self-pay | Admitting: *Deleted

## 2017-02-06 ENCOUNTER — Other Ambulatory Visit (HOSPITAL_COMMUNITY): Payer: Self-pay

## 2017-02-06 ENCOUNTER — Encounter (HOSPITAL_COMMUNITY): Payer: Self-pay

## 2017-02-06 ENCOUNTER — Ambulatory Visit (INDEPENDENT_AMBULATORY_CARE_PROVIDER_SITE_OTHER): Payer: Medicaid Other | Admitting: *Deleted

## 2017-02-06 DIAGNOSIS — I513 Intracardiac thrombosis, not elsewhere classified: Secondary | ICD-10-CM

## 2017-02-06 DIAGNOSIS — I428 Other cardiomyopathies: Secondary | ICD-10-CM | POA: Diagnosis not present

## 2017-02-06 DIAGNOSIS — Z5181 Encounter for therapeutic drug level monitoring: Secondary | ICD-10-CM

## 2017-02-06 DIAGNOSIS — I240 Acute coronary thrombosis not resulting in myocardial infarction: Secondary | ICD-10-CM

## 2017-02-06 LAB — POCT INR: INR: 2.6

## 2017-02-06 MED ORDER — FOLIC ACID 1 MG PO TABS: 1.0000 mg | ORAL_TABLET | Freq: Every day | ORAL | 3 refills | Status: DC

## 2017-02-06 MED FILL — FOLIC ACID 1 MG TABLET: 1 | 30 days supply | Qty: 30 | Fill #0

## 2017-02-06 NOTE — Progress Notes (Signed)
Paramedicine Encounter    Patient ID: Robert Booth, male    DOB: 19-Feb-1971, 46 y.o.   MRN: [id number]    Patient Care Team: Marcine Matar, MD as PCP - General (Internal Medicine)  Patient Active Problem List   Diagnosis Date Noted  . NICM (nonischemic cardiomyopathy) (HCC) 12/01/2016  . H/O ETOH abuse 12/14/2014  . Chronic systolic heart failure (HCC) 11/24/2014  . Elevated LFTs   . Anticoagulated on Coumadin 11/08/2014  . Visual disturbance 11/08/2014  . History of alcohol abuse   . LV (left ventricular) mural thrombus without MI   . Nonischemic cardiomyopathy (HCC) 08/18/2014  . HTN (hypertension) 08/18/2014    Current Outpatient Medications:  .  carvedilol (COREG) 6.25 MG tablet, Take 1 tablet (6.25 mg total) by mouth 2 (two) times daily with a meal., Disp: 60 tablet, Rfl: 0 .  digoxin (LANOXIN) 0.125 MG tablet, Take 1 tablet (0.125 mg total) by mouth daily., Disp: 90 tablet, Rfl: 3 .  folic acid (FOLVITE) 1 MG tablet, Take 1 tablet (1 mg total) daily by mouth., Disp: 30 tablet, Rfl: 3 .  furosemide (LASIX) 20 MG tablet, Take 2 tablets (40 mg total) by mouth daily., Disp: 60 tablet, Rfl: 3 .  hydrALAZINE (APRESOLINE) 50 MG tablet, Take 1.5 tablets (75 mg total) by mouth 3 (three) times daily., Disp: 135 tablet, Rfl: 3 .  isosorbide mononitrate (IMDUR) 30 MG 24 hr tablet, Take 1 tablet (30 mg total) by mouth daily., Disp: 30 tablet, Rfl: 2 .  sacubitril-valsartan (ENTRESTO) 24-26 MG, Take 1 tablet by mouth 2 (two) times daily., Disp: 60 tablet, Rfl: 11 .  spironolactone (ALDACTONE) 25 MG tablet, TAKE 1 TABLET BY MOUTH ONCE DAILY, Disp: 34 tablet, Rfl: 2 .  warfarin (COUMADIN) 7.5 MG tablet, Take 11.25mg  (one and half tab) on MWF, take 7.5mg  ( one tab) on TTSS, Disp: 90 tablet, Rfl: 0 .  Multiple Vitamin (MULTIVITAMIN WITH MINERALS) TABS tablet, Take 1 tablet by mouth daily., Disp: 30 tablet, Rfl: 0 Allergies  Allergen Reactions  . Celexa [Citalopram Hydrobromide] Other  (See Comments)    DISORIENTED  . Lisinopril Cough     Social History   Socioeconomic History  . Marital status: Single    Spouse name: Not on file  . Number of children: 6  . Years of education: Not on file  . Highest education level: Not on file  Social Needs  . Financial resource strain: Not on file  . Food insecurity - worry: Not on file  . Food insecurity - inability: Not on file  . Transportation needs - medical: Not on file  . Transportation needs - non-medical: Not on file  Occupational History  . Occupation: Unemployed  Tobacco Use  . Smoking status: Former Smoker    Types: Cigarettes  . Smokeless tobacco: Former Neurosurgeon    Types: Chew  . Tobacco comment: Was a rare smoker  Substance and Sexual Activity  . Alcohol use: No    Alcohol/week: 0.0 oz  . Drug use: No  . Sexual activity: Yes  Other Topics Concern  . Not on file  Social History Narrative   Lives with mom.      Physical Exam  Pulmonary/Chest: No respiratory distress. He has no wheezes. He has no rales.  Abdominal: He exhibits no distension. There is no tenderness. There is no guarding.  Musculoskeletal: He exhibits no edema.  Skin: Skin is warm and dry. He is not diaphoretic.        Future  Appointments  Date Time Provider Department Center  02/27/2017  7:45 AM [facility]  03/11/2017  8:00 AM [facility]  03/11/2017  9:40 AM Bensimhon, Bevelyn Bucklesaniel R, MD [facility] None  05/08/2017  8:30 AM Marcine MatarJohnson, Deborah B, MD [facility] None    ATF pt CAO x4 sitting in the living room about to leave for the weekend. Pt has no complaints and has refilled his pill box for this week.  Pt denies any new symptoms since last visit.  Pt has not had any dizziness or syncopal episodes.  The defib has not gone off since he's had on the heart vest.  Pt is still watching his food choices and watches what he is drinking.  rx bottles verified and pill box refilled.   BP  100/70   Pulse 76   Resp 16   Wt 192 lb 9.6 oz (87.4 kg)   SpO2 97%   BMI 28.44 kg/m   Weight yesterday-190.6 Last visit weight-189.6    Simisola Sandles, EMT Paramedic 02/06/2017    ACTION: Home visit completed

## 2017-02-24 MED FILL — ISOSORBIDE MN ER 30 MG TAB: 30 | 34 days supply | Qty: 34 | Fill #1

## 2017-02-26 ENCOUNTER — Other Ambulatory Visit (HOSPITAL_COMMUNITY): Payer: Self-pay | Admitting: *Deleted

## 2017-02-26 MED ORDER — WARFARIN SODIUM 7.5 MG PO TABS: ORAL_TABLET | ORAL | 0 refills | Status: DC

## 2017-02-28 MED FILL — DIGOXIN 0.125 MG TABLET: 125 | 30 days supply | Qty: 30 | Fill #1

## 2017-02-28 MED FILL — FOLIC ACID 1 MG TABLET: 1 | 30 days supply | Qty: 30 | Fill #1

## 2017-02-28 MED FILL — SPIRONOLACTONE 25 MG TABLET: 25 | 34 days supply | Qty: 34 | Fill #1

## 2017-03-06 ENCOUNTER — Ambulatory Visit (INDEPENDENT_AMBULATORY_CARE_PROVIDER_SITE_OTHER): Payer: Medicaid Other

## 2017-03-06 ENCOUNTER — Other Ambulatory Visit (HOSPITAL_COMMUNITY): Payer: Self-pay

## 2017-03-06 ENCOUNTER — Ambulatory Visit (HOSPITAL_COMMUNITY)
Admission: RE | Admit: 2017-03-06 | Discharge: 2017-03-06 | Disposition: A | Discharge status: Home / Self Care | Payer: Medicaid Other | Source: Ambulatory Visit | Attending: Internal Medicine | Admitting: Internal Medicine

## 2017-03-06 ENCOUNTER — Encounter (HOSPITAL_COMMUNITY): Payer: Self-pay | Admitting: *Deleted

## 2017-03-06 ENCOUNTER — Encounter (HOSPITAL_COMMUNITY): Payer: Self-pay

## 2017-03-06 ENCOUNTER — Telehealth (HOSPITAL_COMMUNITY): Payer: Self-pay | Admitting: *Deleted

## 2017-03-06 VITALS — BP 118/72 | HR 76 | Wt 200.0 lb

## 2017-03-06 DIAGNOSIS — I130 Hypertensive heart and chronic kidney disease with heart failure and stage 1 through stage 4 chronic kidney disease, or unspecified chronic kidney disease: Secondary | ICD-10-CM | POA: Diagnosis not present

## 2017-03-06 DIAGNOSIS — N182 Chronic kidney disease, stage 2 (mild): Secondary | ICD-10-CM | POA: Diagnosis not present

## 2017-03-06 DIAGNOSIS — Z7901 Long term (current) use of anticoagulants: Secondary | ICD-10-CM | POA: Diagnosis not present

## 2017-03-06 DIAGNOSIS — I513 Intracardiac thrombosis, not elsewhere classified: Secondary | ICD-10-CM | POA: Diagnosis not present

## 2017-03-06 DIAGNOSIS — F419 Anxiety disorder, unspecified: Secondary | ICD-10-CM | POA: Diagnosis not present

## 2017-03-06 DIAGNOSIS — I428 Other cardiomyopathies: Secondary | ICD-10-CM | POA: Insufficient documentation

## 2017-03-06 DIAGNOSIS — Z5181 Encounter for therapeutic drug level monitoring: Secondary | ICD-10-CM | POA: Diagnosis not present

## 2017-03-06 DIAGNOSIS — I5022 Chronic systolic (congestive) heart failure: Secondary | ICD-10-CM

## 2017-03-06 DIAGNOSIS — I240 Acute coronary thrombosis not resulting in myocardial infarction: Secondary | ICD-10-CM

## 2017-03-06 DIAGNOSIS — I10 Essential (primary) hypertension: Secondary | ICD-10-CM

## 2017-03-06 DIAGNOSIS — Z79899 Other long term (current) drug therapy: Secondary | ICD-10-CM | POA: Insufficient documentation

## 2017-03-06 DIAGNOSIS — Z87891 Personal history of nicotine dependence: Secondary | ICD-10-CM | POA: Insufficient documentation

## 2017-03-06 LAB — PROTIME-INR
INR: 1.98
Prothrombin Time: 22.4 seconds — ABNORMAL HIGH (ref 11.4–15.2)

## 2017-03-06 LAB — BASIC METABOLIC PANEL
Anion gap: 9 (ref 5–15)
BUN: 13 mg/dL (ref 6–20)
CALCIUM: 9.3 mg/dL (ref 8.9–10.3)
CO2: 26 mmol/L (ref 22–32)
Chloride: 102 mmol/L (ref 101–111)
Creatinine, Ser: 1.15 mg/dL (ref 0.61–1.24)
GFR calc Af Amer: >60 mL/min (ref >60)
GLUCOSE: 94 mg/dL (ref 65–99)
Potassium: 3.8 mmol/L (ref 3.5–5.1)
Sodium: 137 mmol/L (ref 135–145)

## 2017-03-06 LAB — BRAIN NATRIURETIC PEPTIDE: B Natriuretic Peptide: 51.7 pg/mL (ref 0.0–100.0)

## 2017-03-06 MED ORDER — FUROSEMIDE 40 MG PO TABS: 40.0000 mg | ORAL_TABLET | Freq: Every day | ORAL | 3 refills | Status: DC

## 2017-03-06 MED ORDER — HYDRALAZINE HCL 50 MG PO TABS: 75.0000 mg | ORAL_TABLET | Freq: Three times a day (TID) | ORAL | 3 refills | Status: DC

## 2017-03-06 MED ORDER — CARVEDILOL 6.25 MG PO TABS: 6.2500 mg | ORAL_TABLET | Freq: Two times a day (BID) | ORAL | 3 refills | Status: DC

## 2017-03-06 MED FILL — FUROSEMIDE 40 MG TAB: 40 | 30 days supply | Qty: 30 | Fill #0

## 2017-03-06 MED FILL — hydrALAZINE HCL 50 MG TABS: 50 | 30 days supply | Qty: 135 | Fill #0

## 2017-03-06 MED FILL — CARVEDILOL 6.25 MG TABLET: 6.25 | 30 days supply | Qty: 60 | Fill #0

## 2017-03-06 NOTE — Telephone Encounter (Signed)
Patient was going to miss his appointment with coumadin clinic.  INR checked today in our clinic.  INR-1.98.  Will forward to coumadin clinic.

## 2017-03-06 NOTE — Patient Instructions (Signed)
Labs today (will call for abnormal results, otherwise no news is good news)  INCREASE Lasix to 40 mg Twice Daily for 2 days only, then resume your previous dose.   Follow up as scheduled with Dr. Gala Romney.

## 2017-03-06 NOTE — Patient Instructions (Signed)
Called spoke with pt, advised to take 1.5 tablets today, then resume same dosage  1 tablet everyday except 1.5 tablets on Sundays. Recheck in 4 weeks.

## 2017-03-06 NOTE — Progress Notes (Addendum)
Patient ID: Robert Booth, male   DOB: 10/06/1970, 46 y.o.   MRN: [id number] \  Advanced Heart Failure Clinic Note   [facility]  Primary HF Cardiologist: Dr Gala Romney  HPI: Robert Booth is a 46 y.o. male with hx of systolic HF with NICM secondary to prolonged ETOH abuse, EF 15% 2018, and CKD stage 2.  In 8/16, he presented to [facility] with hemoptysis. Found to have RLL PNA and cardiogenic shock. Treated briefly with milrinone and levophed. Diuresed well. Discharge weight 177 .   EF subsequently improved.   Hospitalized 9/2 through 12/05/2016 with recurrent A/C systolic heart failure in the setting of medication noncompliance. He had been oout of medications for 4 months. Echo with EF 10-15%   Diuresed with IV lasix.  HF meds restarted.   Today he returns for HF follow up. Overall feeling fine. Denies PND/Orthopnea. Over the last couple of days he had increased shortness of breath. Increased abdominal bloating.Says he has been eating [employer].  Appetite ok. No fever or chills. Weight at home 194 pounds.  Taking all medications. Life Vest on.   Echo 10/13/14 EF 20% Echo 12/16 EF 35 - 40%No LV clot Echo 11/30/15 EF 35%  Echo  11/2016 EF 10-15%.   ROS: All systems negative except as listed in HPI, PMH and Problem List.  SH:  Social History   Socioeconomic History  . Marital status: Single    Spouse name: Not on file  . Number of children: 6  . Years of education: Not on file  . Highest education level: Not on file  Social Needs  . Financial resource strain: Not on file  . Food insecurity - worry: Not on file  . Food insecurity - inability: Not on file  . Transportation needs - medical: Not on file  . Transportation needs - non-medical: Not on file  Occupational History  . Occupation: Unemployed  Tobacco Use  . Smoking status: Former Smoker    Types: Cigarettes  . Smokeless tobacco: Former Neurosurgeon    Types: Chew  . Tobacco comment: Was a rare smoker    Substance and Sexual Activity  . Alcohol use: No    Alcohol/week: 0.0 oz  . Drug use: No  . Sexual activity: Yes  Other Topics Concern  . Not on file  Social History Narrative   Lives with mom.      FH:  Family History  Problem Relation Age of Onset  . Hypertension Mother   . Hypertension Father   . Heart attack Father 49       died  . Hypertension Sister   . Heart attack Paternal Grandfather 50       died    Past Medical History:  Diagnosis Date  . Anxiety   . CHF (congestive heart failure) (HCC)   . Essential hypertension   . History of alcohol abuse    a. Sober since Feb 2016.  . 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 NICM (nonischemic cardiomyopathy) (HCC)    a. 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    Current Outpatient Medications  Medication Sig Dispense Refill  . carvedilol (COREG) 6.25 MG tablet Take 1 tablet (6.25 mg total) by mouth 2 (two) times daily with a meal. 60 tablet 3  .  digoxin (LANOXIN) 0.125 MG tablet Take 1 tablet (0.125 mg total) by mouth daily. 90 tablet 3  . folic acid (FOLVITE) 1 MG tablet Take 1 tablet (1 mg total) daily by mouth. 30 tablet 3  . furosemide (LASIX) 40 MG tablet Take 1 tablet (40 mg total) by mouth daily. 30 tablet 3  . hydrALAZINE (APRESOLINE) 50 MG tablet Take 1.5 tablets (75 mg total) by mouth 3 (three) times daily. 135 tablet 3  . isosorbide mononitrate (IMDUR) 30 MG 24 hr tablet Take 1 tablet (30 mg total) by mouth daily. 30 tablet 2  . Multiple Vitamin (MULTIVITAMIN WITH MINERALS) TABS tablet Take 1 tablet by mouth daily. 30 tablet 0  . sacubitril-valsartan (ENTRESTO) 24-26 MG Take 1 tablet by mouth 2 (two) times daily. 60 tablet 11  . spironolactone (ALDACTONE) 25 MG tablet TAKE 1 TABLET BY MOUTH ONCE  DAILY 34 tablet 2  . warfarin (COUMADIN) 7.5 MG tablet Please take as directed by coumadin clinic. 90 tablet 0   No current facility-administered medications for this encounter.     Vitals:   03/06/17 0904  BP: 118/72  Pulse: 76  SpO2: 98%  Weight: 200 lb (90.7 kg)   Filed Weights   03/06/17 0904  Weight: 200 lb (90.7 kg)  .weight PHYSICAL EXAM: General:  Well appearing. No resp difficulty HEENT: normal Neck: supple. ~10 JVD. Carotids 2+ bilat; no bruits. No lymphadenopathy or thryomegaly appreciated. Cor: PMI nondisplaced. Regular rate & rhythm. No rubs, gallops or murmurs.:Life Vest in place Lungs: clear Abdomen: soft, nontender, nondistended. No hepatosplenomegaly. No bruits or masses. Good bowel sounds. Extremities: no cyanosis, clubbing, rash, edema Neuro: alert & orientedx3, cranial nerves grossly intact. moves all 4 extremities w/o difficulty. Affect pleasant    ASSESSMENT & PLAN: 1. Chronic systolic HF - NICM Cath ok performed [place] 2016. 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 Echo 10/2016 EF 10-15%. Now with recurrent LV dysfunction after stopping HF meds. Now back on meds.  NYHA II-III. Volume status up. Increase lasix to 40 mg twice a day x 2 days then lasix 40 mg daily  - Continue Coreg 6.25 mg BID.   - Continue digoxin 0.125 mg daily. Dig level 01/13/2017  Was 0.5   - Continue hydralazine + Imdur.  - Continue Entresto 24/26 mg BID.  - Continue Spiro 25 mg daily.   2. LV mural thrombus  - Continue warfarin for anticoagulation.  - INR today. Will send INR results to coumadin clinic.   3. H/o ETOH abuse quit 05/25/2014 was drinking 1/5 liquor daily.  - No longer drinking.   4. Suspected VT/VF - Continue LifeVest  Follow up next month to repeat ECHO. Contnue Life Vest.   Letter written to remain out of work today. Discussed low salt food choices.    Stacie Templin Filbert Schilderlegg, NP-C  9:58 AM

## 2017-03-06 NOTE — Progress Notes (Signed)
Paramedicine Encounter   Patient ID: Robert Booth , male,   DOB: 1970-04-05,46 y.o.,  MRN: [id number]   Met patient in clinic today with provider Amy.   Pt has had an increase in sob for about a week.  He stated that he had gained weight during the thanksgiving holiday, which he took an extra lasix.  Pt stated yesterday that he experience sob while taking a shower and doing normal activities during the day.  Pt ate fast food from [employer] yesterday also.    Amy advised pt that he has fluid retention in the abdomen.  She advised him to take an extra 40 mg in the evening of lasix for two days.  Pt's pill box refilled accordingly.    Pts living situation has changed and he is no longer living with his sister full time.  We decided to meet monthly when he's in town.  Pt has been refilling his pill box on his own for about a month now and he hasn't made any mistakes. Pt understands how to read food labels and he weighs himself daily.  Pt was advised to call if he has any questions or concerns.   Time spent with patient 50 mins  Shandra Szymborski, EMT-Paramedic 03/06/2017   ACTION: Next visit planned for jan 16

## 2017-03-11 ENCOUNTER — Encounter (HOSPITAL_COMMUNITY): Payer: Medicaid Other | Admitting: Internal Medicine

## 2017-03-11 ENCOUNTER — Ambulatory Visit (HOSPITAL_COMMUNITY): Payer: Medicaid Other

## 2017-04-02 ENCOUNTER — Encounter (HOSPITAL_COMMUNITY): Payer: Self-pay | Admitting: *Deleted

## 2017-04-02 NOTE — Progress Notes (Addendum)
Received a duplicate request on April 03, 2017.  Requested records faxed again today to [phone].  CASE # [id number].    Received medical records request from [facility] on 03/31/2017 CASE # [id number]  Requested records faxed today to [phone].  Original request will be scanned to patient's electronic medical record.

## 2017-04-03 ENCOUNTER — Ambulatory Visit (INDEPENDENT_AMBULATORY_CARE_PROVIDER_SITE_OTHER): Payer: Medicaid Other | Admitting: *Deleted

## 2017-04-03 DIAGNOSIS — I428 Other cardiomyopathies: Secondary | ICD-10-CM | POA: Diagnosis not present

## 2017-04-03 DIAGNOSIS — Z5181 Encounter for therapeutic drug level monitoring: Secondary | ICD-10-CM

## 2017-04-03 DIAGNOSIS — I513 Intracardiac thrombosis, not elsewhere classified: Secondary | ICD-10-CM

## 2017-04-03 DIAGNOSIS — I240 Acute coronary thrombosis not resulting in myocardial infarction: Secondary | ICD-10-CM

## 2017-04-03 DIAGNOSIS — Z7901 Long term (current) use of anticoagulants: Secondary | ICD-10-CM

## 2017-04-03 LAB — POCT INR: INR: 0.9

## 2017-04-03 NOTE — Patient Instructions (Signed)
Description   Today and tomorrow take 1.5 tablets then continue taking same dosage 1 tablet everyday except 1.5 tablets on Sundays. Recheck in 1 week.

## 2017-04-14 MED FILL — hydrALAZINE HCL 50 MG TABS: 50 | 30 days supply | Qty: 135 | Fill #1

## 2017-04-14 MED FILL — FUROSEMIDE 40 MG TAB: 40 | 30 days supply | Qty: 30 | Fill #1

## 2017-04-14 MED FILL — FOLIC ACID 1 MG TABLET: 1 | 30 days supply | Qty: 30 | Fill #2

## 2017-04-14 MED FILL — CARVEDILOL 6.25 MG TABLET: 6.25 | 30 days supply | Qty: 60 | Fill #1

## 2017-04-14 MED FILL — ISOSORBIDE MN ER 30 MG TAB: 30 | 34 days supply | Qty: 34 | Fill #2

## 2017-04-14 MED FILL — SPIRONOLACTONE 25 MG TABLET: 25 | 34 days supply | Qty: 34 | Fill #2

## 2017-04-14 MED FILL — DIGOXIN 0.125 MG TABLET: 125 | 30 days supply | Qty: 30 | Fill #2

## 2017-04-14 MED FILL — WARFARIN SODIUM 7.5 MG TAB: 7.5 | 30 days supply | Qty: 30 | Fill #0

## 2017-04-16 ENCOUNTER — Ambulatory Visit (HOSPITAL_BASED_OUTPATIENT_CLINIC_OR_DEPARTMENT_OTHER)
Admission: RE | Admit: 2017-04-16 | Discharge: 2017-04-16 | Disposition: A | Discharge status: Home / Self Care | Payer: Medicaid Other | Source: Ambulatory Visit | Attending: Internal Medicine | Admitting: Internal Medicine

## 2017-04-16 ENCOUNTER — Other Ambulatory Visit (HOSPITAL_COMMUNITY): Payer: Self-pay

## 2017-04-16 ENCOUNTER — Encounter (HOSPITAL_COMMUNITY): Payer: Self-pay | Admitting: *Deleted

## 2017-04-16 ENCOUNTER — Encounter (HOSPITAL_COMMUNITY): Payer: Self-pay | Admitting: Internal Medicine

## 2017-04-16 ENCOUNTER — Ambulatory Visit (HOSPITAL_COMMUNITY)
Admission: RE | Admit: 2017-04-16 | Discharge: 2017-04-16 | Disposition: A | Discharge status: Home / Self Care | Payer: Medicaid Other | Source: Ambulatory Visit | Attending: Cardiology | Admitting: Cardiology

## 2017-04-16 ENCOUNTER — Ambulatory Visit (INDEPENDENT_AMBULATORY_CARE_PROVIDER_SITE_OTHER): Payer: Medicaid Other | Admitting: *Deleted

## 2017-04-16 VITALS — BP 133/70 | HR 64 | Wt 201.4 lb

## 2017-04-16 DIAGNOSIS — Z87891 Personal history of nicotine dependence: Secondary | ICD-10-CM | POA: Insufficient documentation

## 2017-04-16 DIAGNOSIS — Z7901 Long term (current) use of anticoagulants: Secondary | ICD-10-CM

## 2017-04-16 DIAGNOSIS — Z9114 Patient's other noncompliance with medication regimen: Secondary | ICD-10-CM | POA: Diagnosis not present

## 2017-04-16 DIAGNOSIS — I5022 Chronic systolic (congestive) heart failure: Secondary | ICD-10-CM | POA: Insufficient documentation

## 2017-04-16 DIAGNOSIS — I513 Intracardiac thrombosis, not elsewhere classified: Secondary | ICD-10-CM | POA: Diagnosis not present

## 2017-04-16 DIAGNOSIS — Z5181 Encounter for therapeutic drug level monitoring: Secondary | ICD-10-CM | POA: Diagnosis not present

## 2017-04-16 DIAGNOSIS — Z79899 Other long term (current) drug therapy: Secondary | ICD-10-CM | POA: Diagnosis not present

## 2017-04-16 DIAGNOSIS — I509 Heart failure, unspecified: Secondary | ICD-10-CM | POA: Diagnosis present

## 2017-04-16 DIAGNOSIS — F419 Anxiety disorder, unspecified: Secondary | ICD-10-CM | POA: Insufficient documentation

## 2017-04-16 DIAGNOSIS — I428 Other cardiomyopathies: Secondary | ICD-10-CM | POA: Insufficient documentation

## 2017-04-16 DIAGNOSIS — I240 Acute coronary thrombosis not resulting in myocardial infarction: Secondary | ICD-10-CM

## 2017-04-16 DIAGNOSIS — I272 Pulmonary hypertension, unspecified: Secondary | ICD-10-CM | POA: Diagnosis not present

## 2017-04-16 DIAGNOSIS — I130 Hypertensive heart and chronic kidney disease with heart failure and stage 1 through stage 4 chronic kidney disease, or unspecified chronic kidney disease: Secondary | ICD-10-CM | POA: Diagnosis not present

## 2017-04-16 DIAGNOSIS — N182 Chronic kidney disease, stage 2 (mild): Secondary | ICD-10-CM | POA: Diagnosis not present

## 2017-04-16 LAB — BASIC METABOLIC PANEL
Anion gap: 10 (ref 5–15)
BUN: 14 mg/dL (ref 6–20)
CO2: 26 mmol/L (ref 22–32)
CREATININE: 1.14 mg/dL (ref 0.61–1.24)
Calcium: 9.3 mg/dL (ref 8.9–10.3)
Chloride: 105 mmol/L (ref 101–111)
Glucose, Bld: 91 mg/dL (ref 65–99)
Potassium: 4 mmol/L (ref 3.5–5.1)
Sodium: 141 mmol/L (ref 135–145)

## 2017-04-16 LAB — POCT INR: INR: 2.2

## 2017-04-16 MED ORDER — FUROSEMIDE 40 MG PO TABS: 40.0000 mg | ORAL_TABLET | Freq: Every day | ORAL | 3 refills | Status: DC

## 2017-04-16 MED ORDER — SACUBITRIL-VALSARTAN 49-51 MG PO TABS: 1.0000 | ORAL_TABLET | Freq: Two times a day (BID) | ORAL | 3 refills | Status: DC

## 2017-04-16 NOTE — Addendum Note (Signed)
Encounter addended by: Noralee Space, RN on: 04/16/2017 10:14 AM  Actions taken: Sign clinical note

## 2017-04-16 NOTE — Addendum Note (Signed)
Encounter addended by: Noralee Space, RN on: 04/16/2017 9:51 AM  Actions taken: Diagnosis association updated, Sign clinical note, Pharmacy for encounter modified, Order list changed

## 2017-04-16 NOTE — Progress Notes (Signed)
Patient ID: Robert Booth, male   DOB: 12-02-1970, 47 y.o.   MRN: [id number] \  Advanced Heart Failure Clinic Note   [facility]  Primary HF Cardiologist: Dr Gala Romney  HPI: Robert Booth is a 47 y.o. male with hx of systolic HF with NICM secondary to prolonged ETOH abuse, EF 15% 2018, and CKD stage 2.  In 8/16, he presented to [facility] with hemoptysis. Found to have RLL PNA and cardiogenic shock. Treated briefly with milrinone and levophed. Diuresed well. Discharge weight 177 .   EF subsequently improved.   Hospitalized 9/2 through 12/05/2016 with recurrent A/C systolic heart failure in the setting of medication noncompliance. He had been oout of medications for 4 months. Echo with EF 10-15%   Diuresed with IV lasix.  HF meds restarted. D/c weight 177 lbs  Today he returns for HF follow up. Echo this am EF 20-25% (Personally reviewed) has good days and bad days. On good days can do mos things without problem. On bad days struggles with ADLs. Denies edema or PND but weight continues to climb. Taking lasix 40 daily. Takes extra occasionally. Wearing LifeVest.   Echo 10/13/14 EF 20% Echo 12/16 EF 35 - 40%No LV clot Echo 11/30/15 EF 35%  Echo  11/2016 EF 10-15%.   ROS: All systems negative except as listed in HPI, PMH and Problem List.  SH:  Social History   Socioeconomic History  . Marital status: Single    Spouse name: Not on file  . Number of children: 6  . Years of education: Not on file  . Highest education level: Not on file  Social Needs  . Financial resource strain: Not on file  . Food insecurity - worry: Not on file  . Food insecurity - inability: Not on file  . Transportation needs - medical: Not on file  . Transportation needs - non-medical: Not on file  Occupational History  . Occupation: Unemployed  Tobacco Use  . Smoking status: Former Smoker    Types: Cigarettes  . Smokeless tobacco: Former Neurosurgeon    Types: Chew  . Tobacco comment: Was a rare  smoker  Substance and Sexual Activity  . Alcohol use: No    Alcohol/week: 0.0 oz  . Drug use: No  . Sexual activity: Yes  Other Topics Concern  . Not on file  Social History Narrative   Lives with mom.      FH:  Family History  Problem Relation Age of Onset  . Hypertension Mother   . Hypertension Father   . Heart attack Father 37       died  . Hypertension Sister   . Heart attack Paternal Grandfather 50       died    Past Medical History:  Diagnosis Date  . Anxiety   . CHF (congestive heart failure) (HCC)   . Essential hypertension   . History of alcohol abuse    a. Sober since Feb 2016.  . 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 NICM (nonischemic cardiomyopathy) (HCC)    a. 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    Current Outpatient Medications  Medication Sig Dispense Refill  . carvedilol (COREG) 6.25 MG tablet Take 1 tablet (6.25 mg total) by mouth 2 (two) times daily with a meal. 60 tablet  3  . digoxin (LANOXIN) 0.125 MG tablet Take 1 tablet (0.125 mg total) by mouth daily. 90 tablet 3  . folic acid (FOLVITE) 1 MG tablet Take 1 tablet (1 mg total) daily by mouth. 30 tablet 3  . furosemide (LASIX) 40 MG tablet Take 1 tablet (40 mg total) by mouth daily. 30 tablet 3  . hydrALAZINE (APRESOLINE) 50 MG tablet Take 1.5 tablets (75 mg total) by mouth 3 (three) times daily. 135 tablet 3  . isosorbide mononitrate (IMDUR) 30 MG 24 hr tablet Take 1 tablet (30 mg total) by mouth daily. 30 tablet 2  . Multiple Vitamin (MULTIVITAMIN WITH MINERALS) TABS tablet Take 1 tablet by mouth daily. 30 tablet 0  . sacubitril-valsartan (ENTRESTO) 24-26 MG Take 1 tablet by mouth 2 (two) times daily. 60 tablet 11  . spironolactone (ALDACTONE) 25 MG tablet TAKE 1 TABLET BY MOUTH  ONCE DAILY 34 tablet 2  . warfarin (COUMADIN) 7.5 MG tablet Please take as directed by coumadin clinic. 90 tablet 0   No current facility-administered medications for this encounter.     Vitals:   04/16/17 0855  BP: 133/70  Pulse: 64  SpO2: 99%  Weight: 201 lb 6.4 oz (91.4 kg)   Filed Weights   04/16/17 0855  Weight: 201 lb 6.4 oz (91.4 kg)   PHYSICAL EXAM: General:  Well appearing. No resp difficulty HEENT: normal Neck: supple. JVP hard to see Carotids 2+ bilat; no bruits. No lymphadenopathy or thryomegaly appreciated. Cor: PMI nondisplaced. Regular rate & rhythm. No rubs, gallops or murmurs. Lungs: clear Abdomen: soft, nontender, mildly distended. No hepatosplenomegaly. No bruits or masses. Good bowel sounds. Extremities: no cyanosis, clubbing, rash, edema Neuro: alert & orientedx3, cranial nerves grossly intact. moves all 4 extremities w/o difficulty. Affect pleasant     ASSESSMENT & PLAN: 1. Chronic systolic HF - NICM Cath ok performed [place] 2016. 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 Echo 10/2016 EF 10-15%. Now with recurrent LV dysfunction after stopping HF meds. Now back on meds.  - Echo today EF 20-25% (Personally reviewed) RV normal. Mildly improved  - Remains NYHA III. - Volume status slightly elevated. In crease lasix to 80mg  daily on Mon/Fri. Take 80 today - Continue Coreg 6.25 mg BID.   - Continue digoxin 0.125 mg daily. Dig level 01/13/2017  Was 0.5   - Continue hydralazine + Imdur.  - Increase Entresto to 49/51. See back in PharmD clinic 2 weeks to continue aggressive titration  - Continue Spiro 25 mg daily.  - Labs today.  - cMRI  2. LV mural thrombus  - Continue warfarin for anticoagulation.  - No bleeding  3. H/o ETOH abuse quit 05/25/2014 was drinking 1/5 liquor daily.  - No longer drinking.   4. Suspected VT/VF -Continue Lifevest for now   Arvilla Meres, MD 9:17 AM

## 2017-04-16 NOTE — Patient Instructions (Signed)
Description   Continue taking same dosage 1 tablet everyday except 1.5 tablets on Sundays. Recheck in 2 weeks.

## 2017-04-16 NOTE — Patient Instructions (Addendum)
Increase Entresto to 49/51 mg Twice daily   Increase Furosemide (Lasix) to 80 mg (2 tabs) on Mondays and Fridays ONLY, continue 40 mg all other days  Labs today  Your physician has requested that you have a cardiac MRI. Cardiac MRI uses a computer to create images of your heart as its beating, producing both still and moving pictures of your heart and major blood vessels. For further information please visit [email or web address]. Please follow the instruction sheet given to you today for more information.  ONCE APPROVED BY YOUR INSURANCE, RADIOLOGY WILL CALL YOU TO SCHEDULE.  Your physician recommends that you schedule a follow-up appointment in: 2 weeks with Fara Chute D  Your physician recommends that you schedule a follow-up appointment in: 6 weeks

## 2017-04-16 NOTE — Progress Notes (Signed)
Clinical Intake:                               Paramedicine Encounter   Patient ID: Robert Booth , male,   DOB: Nov 11, 1970,46 y.o.,  MRN: [id number]   Met patient in clinic today with provider Dr. Lilla Shook.   I arrived a few mins late, pt was in the middle of talking with Dr. Linna Hoff upon my arrival. Pt had reported that he still have good days and bad days.  He stated that he still feel dizzy from time to time but no syncope or chest pain.  Pt has gained weight but theres no fluid retention in his legs and he doesn't feel that his abdomen is tight.  Dr. Linna Hoff would like pt to return in two weeks to talk with erika and look at possible medication changes.    Pt brought both pill box and meds to this appointment. I filled his pill box according to the revisions made during todays visit. Pt understands how to fill his box in the upcoming weeks.  He lives in [place] now, therefore we don't visit at home anymore.  I asked pt if we could began telephone visits, so I can monitor him after this medication change.  I will f/u with him via telephone next week.  **rx changed today: Increase lasix mon and fri 80 mg Increase enstresto   Time spent with patient [place] Lorilynn Lehr, EMT-Paramedic 04/16/2017   ACTION: Next visit planned for telephone encouter next week

## 2017-04-16 NOTE — Progress Notes (Signed)
Echocardiogram 2D Echocardiogram has been performed.  Robert Booth 04/16/2017, 8:44 AM

## 2017-04-16 NOTE — Progress Notes (Signed)
Medication Samples have been provided to the patient.  Drug name: Sherryll Burger       Strength: 49/51mg          Qty: 2  LOT: [id number] Exp.Date: 1/21  Dosing instructions: Take 1 tab Twice daily   The patient has been instructed regarding the correct time, dose, and frequency of taking this medication, including desired effects and most common side effects.   Agustine Rossitto 10:13 AM 04/16/2017

## 2017-04-17 ENCOUNTER — Other Ambulatory Visit (HOSPITAL_COMMUNITY): Payer: Self-pay | Admitting: *Deleted

## 2017-04-17 DIAGNOSIS — I5022 Chronic systolic (congestive) heart failure: Secondary | ICD-10-CM

## 2017-04-18 ENCOUNTER — Telehealth (HOSPITAL_COMMUNITY): Payer: Self-pay | Admitting: Pharmacist

## 2017-04-18 NOTE — Telephone Encounter (Signed)
Entresto PA approved by [place] [employer] through 04/12/18.   Tyler Deis. Bonnye Fava, PharmD, BCPS, CPP Clinical Pharmacist Phone: [phone] 04/18/2017 10:03 AM

## 2017-04-24 MED FILL — ENTRESTO 49 MG-51 MG TABLET: 49-51 | 30 days supply | Qty: 60 | Fill #0

## 2017-04-29 ENCOUNTER — Ambulatory Visit (HOSPITAL_COMMUNITY): Payer: Medicaid Other

## 2017-04-29 ENCOUNTER — Ambulatory Visit: Payer: Medicaid Other | Admitting: Internal Medicine

## 2017-04-29 DIAGNOSIS — Z736 Limitation of activities due to disability: Secondary | ICD-10-CM

## 2017-05-01 ENCOUNTER — Telehealth: Payer: Self-pay | Admitting: Internal Medicine

## 2017-05-01 NOTE — Telephone Encounter (Signed)
Called the patient and LVM for call me back regarding time he wants his cardiac MRI.

## 2017-05-08 ENCOUNTER — Encounter: Payer: Self-pay | Admitting: Internal Medicine

## 2017-05-08 ENCOUNTER — Telehealth: Payer: Self-pay | Admitting: Internal Medicine

## 2017-05-08 ENCOUNTER — Encounter: Payer: Self-pay | Admitting: Cardiology

## 2017-05-08 ENCOUNTER — Ambulatory Visit: Payer: Medicaid Other | Admitting: Internal Medicine

## 2017-05-08 NOTE — Telephone Encounter (Signed)
Called patient and gave him the date, time and location of his cardiac MRI.  Letter mailed to patient and nurse.

## 2017-05-12 ENCOUNTER — Ambulatory Visit: Payer: Medicaid Other | Admitting: Internal Medicine

## 2017-05-12 ENCOUNTER — Inpatient Hospital Stay (HOSPITAL_COMMUNITY): Admission: RE | Admit: 2017-05-12 | Payer: Medicaid Other | Source: Ambulatory Visit

## 2017-05-16 DIAGNOSIS — I10 Essential (primary) hypertension: Secondary | ICD-10-CM

## 2017-05-20 ENCOUNTER — Ambulatory Visit (HOSPITAL_COMMUNITY)
Admission: RE | Admit: 2017-05-20 | Discharge: 2017-05-20 | Disposition: A | Discharge status: Home / Self Care | Payer: Medicaid Other | Source: Ambulatory Visit | Attending: Cardiology | Admitting: Cardiology

## 2017-05-20 ENCOUNTER — Telehealth (HOSPITAL_COMMUNITY): Payer: Self-pay | Admitting: Surgery

## 2017-05-20 DIAGNOSIS — I5022 Chronic systolic (congestive) heart failure: Secondary | ICD-10-CM | POA: Diagnosis not present

## 2017-05-20 DIAGNOSIS — I429 Cardiomyopathy, unspecified: Secondary | ICD-10-CM | POA: Diagnosis not present

## 2017-05-20 MED ORDER — GADOBENATE DIMEGLUMINE 529 MG/ML IV SOLN
30.0000 mL | Freq: Once | INTRAVENOUS | Status: AC | PRN
  Administered 2017-05-20: 30 mL via INTRAVENOUS

## 2017-05-20 MED FILL — FUROSEMIDE 40 MG TAB: 40 | 30 days supply | Qty: 30 | Fill #2

## 2017-05-20 NOTE — Telephone Encounter (Signed)
Patient will be discharged at this time from the [facility] due to inability to contact and his relocation.

## 2017-05-21 ENCOUNTER — Telehealth (HOSPITAL_COMMUNITY): Payer: Self-pay

## 2017-05-21 NOTE — Telephone Encounter (Signed)
Mr. [place].  He stated initially that he would be in [place] about 2-3 a month.  Unfortunately he hasn't been able to come back as often as he initially thought.  I've been able to see him during his heart failure clinic visit and speak with him via telephone.  Due to him being out of [place] I think that he can not fully benefit from the [facility].

## 2017-05-22 ENCOUNTER — Other Ambulatory Visit (HOSPITAL_COMMUNITY): Payer: Self-pay | Admitting: Adult Health

## 2017-05-22 MED FILL — CARVEDILOL 6.25 MG TABLET: 6.25 | 30 days supply | Qty: 60 | Fill #2

## 2017-05-22 MED FILL — hydrALAZINE HCL 50 MG TABS: 50 | 30 days supply | Qty: 135 | Fill #2

## 2017-05-22 MED FILL — DIGOXIN 0.125 MG TABLET: 125 | 30 days supply | Qty: 30 | Fill #3

## 2017-05-22 MED FILL — FOLIC ACID 1 MG TABLET: 1 | 30 days supply | Qty: 30 | Fill #3

## 2017-05-22 MED FILL — SPIRONOLACTONE 25 MG TABLET: 25 | 34 days supply | Qty: 34 | Fill #0

## 2017-05-22 MED FILL — ISOSORBIDE MN ER 30 MG TAB: 30 | 34 days supply | Qty: 34 | Fill #0

## 2017-05-23 ENCOUNTER — Telehealth (HOSPITAL_COMMUNITY): Payer: Self-pay | Admitting: Surgery

## 2017-05-23 NOTE — Telephone Encounter (Signed)
Patient notified [facility] [employer] today that he has returned to [place] and will be staying.  We will pick him back up into [facility] at this time.  He is aware and will notify clinic of any needs or concerns related to his HF.

## 2017-05-26 MED FILL — ENTRESTO 49 MG-51 MG TABLET: 49-51 | 30 days supply | Qty: 60 | Fill #1

## 2017-05-28 ENCOUNTER — Ambulatory Visit (HOSPITAL_COMMUNITY)
Admission: RE | Admit: 2017-05-28 | Discharge: 2017-05-28 | Disposition: A | Discharge status: Home / Self Care | Payer: Medicaid Other | Source: Ambulatory Visit | Attending: Cardiology | Admitting: Cardiology

## 2017-05-28 ENCOUNTER — Other Ambulatory Visit (HOSPITAL_COMMUNITY): Payer: Self-pay

## 2017-05-28 ENCOUNTER — Encounter (HOSPITAL_COMMUNITY): Payer: Self-pay

## 2017-05-28 ENCOUNTER — Ambulatory Visit (HOSPITAL_COMMUNITY): Payer: Medicaid Other

## 2017-05-28 ENCOUNTER — Ambulatory Visit (INDEPENDENT_AMBULATORY_CARE_PROVIDER_SITE_OTHER): Payer: Medicaid Other | Admitting: *Deleted

## 2017-05-28 VITALS — BP 136/86 | HR 76 | Wt 206.0 lb

## 2017-05-28 DIAGNOSIS — R0602 Shortness of breath: Secondary | ICD-10-CM | POA: Diagnosis not present

## 2017-05-28 DIAGNOSIS — I130 Hypertensive heart and chronic kidney disease with heart failure and stage 1 through stage 4 chronic kidney disease, or unspecified chronic kidney disease: Secondary | ICD-10-CM | POA: Diagnosis not present

## 2017-05-28 DIAGNOSIS — I513 Intracardiac thrombosis, not elsewhere classified: Secondary | ICD-10-CM

## 2017-05-28 DIAGNOSIS — F17210 Nicotine dependence, cigarettes, uncomplicated: Secondary | ICD-10-CM | POA: Insufficient documentation

## 2017-05-28 DIAGNOSIS — F419 Anxiety disorder, unspecified: Secondary | ICD-10-CM | POA: Diagnosis not present

## 2017-05-28 DIAGNOSIS — I429 Cardiomyopathy, unspecified: Secondary | ICD-10-CM | POA: Insufficient documentation

## 2017-05-28 DIAGNOSIS — N182 Chronic kidney disease, stage 2 (mild): Secondary | ICD-10-CM | POA: Insufficient documentation

## 2017-05-28 DIAGNOSIS — I428 Other cardiomyopathies: Secondary | ICD-10-CM | POA: Diagnosis not present

## 2017-05-28 DIAGNOSIS — Z7901 Long term (current) use of anticoagulants: Secondary | ICD-10-CM

## 2017-05-28 DIAGNOSIS — Z9114 Patient's other noncompliance with medication regimen: Secondary | ICD-10-CM | POA: Diagnosis not present

## 2017-05-28 DIAGNOSIS — I10 Essential (primary) hypertension: Secondary | ICD-10-CM | POA: Diagnosis not present

## 2017-05-28 DIAGNOSIS — I240 Acute coronary thrombosis not resulting in myocardial infarction: Secondary | ICD-10-CM

## 2017-05-28 DIAGNOSIS — Z79899 Other long term (current) drug therapy: Secondary | ICD-10-CM | POA: Insufficient documentation

## 2017-05-28 DIAGNOSIS — Z5181 Encounter for therapeutic drug level monitoring: Secondary | ICD-10-CM | POA: Diagnosis not present

## 2017-05-28 DIAGNOSIS — I5022 Chronic systolic (congestive) heart failure: Secondary | ICD-10-CM | POA: Insufficient documentation

## 2017-05-28 DIAGNOSIS — M79671 Pain in right foot: Secondary | ICD-10-CM | POA: Insufficient documentation

## 2017-05-28 DIAGNOSIS — Z8249 Family history of ischemic heart disease and other diseases of the circulatory system: Secondary | ICD-10-CM | POA: Insufficient documentation

## 2017-05-28 DIAGNOSIS — I252 Old myocardial infarction: Secondary | ICD-10-CM | POA: Insufficient documentation

## 2017-05-28 LAB — BASIC METABOLIC PANEL
Anion gap: 10 (ref 5–15)
BUN: 14 mg/dL (ref 6–20)
CHLORIDE: 103 mmol/L (ref 101–111)
CO2: 23 mmol/L (ref 22–32)
CREATININE: 1.03 mg/dL (ref 0.61–1.24)
Calcium: 9.4 mg/dL (ref 8.9–10.3)
GFR calc Af Amer: >60 mL/min (ref >60)
GFR calc non Af Amer: >60 mL/min (ref >60)
GLUCOSE: 84 mg/dL (ref 65–99)
Potassium: 3.9 mmol/L (ref 3.5–5.1)
Sodium: 136 mmol/L (ref 135–145)

## 2017-05-28 LAB — BRAIN NATRIURETIC PEPTIDE: B Natriuretic Peptide: 89.1 pg/mL (ref 0.0–100.0)

## 2017-05-28 LAB — URIC ACID: Uric Acid, Serum: 9.7 mg/dL — ABNORMAL HIGH (ref 4.4–7.6)

## 2017-05-28 LAB — POCT INR: INR: 1.5

## 2017-05-28 MED ORDER — WARFARIN SODIUM 7.5 MG PO TABS: ORAL_TABLET | ORAL | 0 refills | Status: DC

## 2017-05-28 MED ORDER — SACUBITRIL-VALSARTAN 97-103 MG PO TABS: 1.0000 | ORAL_TABLET | Freq: Two times a day (BID) | ORAL | 11 refills | Status: DC

## 2017-05-28 MED FILL — ENTRESTO 97 MG-103 MG TAB: 97-103 | 30 days supply | Qty: 60 | Fill #0

## 2017-05-28 MED FILL — WARFARIN SODIUM 7.5 MG TAB: 7.5 | 30 days supply | Qty: 45 | Fill #0

## 2017-05-28 NOTE — Progress Notes (Signed)
Paramedicine Encounter   Patient ID: Robert Booth , male,   DOB: [birth year],46 y.o.,  MRN: [id number]   Met patient in clinic today with provide Amyr.   Pt stated that he is back in [place], living with his sister.  Pt stated that he is a little worried about being "put to sleep" to get the defib placed.  Amy spoke with pt about getting the tests done to see if his heart still weak or if it's his lungs.  Pt has not had much movement withhin the past week.  Pt seem to put down in spirits during this visit.  Unknown if he has some personal issues going on at this time.  He denies sob and chest pain today. Pt brought his meds to this appointment and he took his morning meds prior to this appointment.  He has been filling his own pill box since we last met. I will continue following pt until after the procedure.  I will also try to find him some information about the procedure and device, so that he can feel more comfortable.  I will f/u with him next week Time spent with patient 30 mins   *med change: entresto increase to 97-103 (pt will double up until he gets the new prescription filled)  [place] Zehava Turski, EMT-Paramedic 05/28/2017    ACTION: Home visit completed

## 2017-05-28 NOTE — Progress Notes (Signed)
Patient ID: Robert Booth, male   DOB: 07/21/1970, 47 y.o.   MRN: [id number] \  Advanced Heart Failure Clinic Note   PCP: [facility].  Primary HF Cardiologist: Dr Gala Romney  HPI: Robert Booth is a 47 y.o. male with hx of systolic HF with NICM secondary to prolonged ETOH abuse, EF 15% 2018, and CKD stage 2.  In 8/16, he presented to [facility] with hemoptysis. Found to have RLL PNA and cardiogenic shock. Treated briefly with milrinone and levophed. Diuresed well. Discharge weight 177 .   EF subsequently improved.   Hospitalized 9/2 through 12/05/2016 with recurrent A/C systolic heart failure in the setting of medication noncompliance. He had been oout of medications for 4 months. Echo with EF 10-15%   Diuresed with IV lasix.  HF meds restarted. D/c weight 177 lbs  Today he returns for HF follow up. Overall feeling just ok. Recently having more bad days than good. Says he has been tired. Complaining of R foot pain/toe pain.  SOB with exertion. Denies PND/Orthopnea. Appetite ok. No fever or chills. Weight at home 200-205 pounds. Taking all medications. Currently not working. Wearing Life Vest.   Echo 10/13/14 EF 20% Echo 12/16 EF 35 - 40%No LV clot Echo 11/30/15 EF 35%  Echo  11/2016 EF 10-15%.  ECHO 04/2017 EF ~20%. RV moderately dilated   CMRI 05/20/2017  1. Mild to moderately dilated LV with EF 19%. Diffuse hypokinesis with septal-lateral dyssynchrony. There is a small thinned area of the basal inferoseptal wall, there does not appear to be an actual VSD. No LV thrombus noted. 2.  Normal RV size with mildly decreased systolic function. 3. Noncoronary LGE pattern as noted above. This could be suggestive of prior myocarditis, less likely cardiac sarcoidosis.   ROS: All systems negative except as listed in HPI, PMH and Problem List.  SH:  Social History   Socioeconomic History  . Marital status: Single    Spouse name: Not on file  . Number of children: 6  . Years of  education: Not on file  . Highest education level: Not on file  Social Needs  . Financial resource strain: Not on file  . Food insecurity - worry: Not on file  . Food insecurity - inability: Not on file  . Transportation needs - medical: Not on file  . Transportation needs - non-medical: Not on file  Occupational History  . Occupation: Unemployed  Tobacco Use  . Smoking status: Former Smoker    Types: Cigarettes  . Smokeless tobacco: Former Neurosurgeon    Types: Chew  . Tobacco comment: Was a rare smoker  Substance and Sexual Activity  . Alcohol use: No    Alcohol/week: 0.0 oz  . Drug use: No  . Sexual activity: Yes  Other Topics Concern  . Not on file  Social History Narrative   Lives with mom.      FH:  Family History  Problem Relation Age of Onset  . Hypertension Mother   . Hypertension Father   . Heart attack Father 28       died  . Hypertension Sister   . Heart attack Paternal Grandfather 50       died    Past Medical History:  Diagnosis Date  . Anxiety   . CHF (congestive heart failure) (HCC)   . Essential hypertension   . History of alcohol abuse    a. Sober since Feb 2016.  . LV (left ventricular) mural thrombus 07/2014   Documented  n [place]  . NICM (nonischemic cardiomyopathy) (HCC)    a. 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    Current Outpatient Medications  Medication Sig Dispense Refill  . carvedilol (COREG) 6.25 MG tablet Take 1 tablet (6.25 mg total) by mouth 2 (two) times daily with a meal. 60 tablet 3  . digoxin (LANOXIN) 0.125 MG tablet Take 1 tablet (0.125 mg total) by mouth daily. 90 tablet 3  . folic acid (FOLVITE) 1 MG tablet Take 1 tablet (1 mg total) daily by mouth. 30 tablet 3  . furosemide (LASIX) 40 MG tablet Take 1 tablet (40 mg total) by mouth daily. Take  an extra tab on Mondays and Fridays 40 tablet 3  . hydrALAZINE (APRESOLINE) 50 MG tablet Take 1.5 tablets (75 mg total) by mouth 3 (three) times daily. 135 tablet 3  . isosorbide mononitrate (IMDUR) 30 MG 24 hr tablet Take 1 tablet (30 mg total) by mouth daily. 30 tablet 2  . Multiple Vitamin (MULTIVITAMIN WITH MINERALS) TABS tablet Take 1 tablet by mouth daily. 30 tablet 0  . sacubitril-valsartan (ENTRESTO) 49-51 MG Take 1 tablet by mouth 2 (two) times daily. 60 tablet 3  . spironolactone (ALDACTONE) 25 MG tablet TAKE 1 TABLET BY MOUTH ONCE DAILY 34 tablet 2  . warfarin (COUMADIN) 7.5 MG tablet Please take as directed by coumadin clinic. 90 tablet 0   No current facility-administered medications for this encounter.     Vitals:   05/28/17 0832  BP: 136/86  Pulse: 76  SpO2: 98%  Weight: 206 lb (93.4 kg)   Filed Weights   05/28/17 0832  Weight: 206 lb (93.4 kg)   PHYSICAL EXAM: General:   No resp difficulty.  HEENT: normal Neck: supple. JVP 7-8. Carotids 2+ bilat; no bruits. No lymphadenopathy or thryomegaly appreciated. Cor: PMI nondisplaced. Regular rate & rhythm. No rubs, gallops or murmurs. Lungs: clear Abdomen: soft, nontender, nondistended. No hepatosplenomegaly. No bruits or masses. Good bowel sounds. Extremities: no cyanosis, clubbing, rash, edema Neuro: alert & orientedx3, cranial nerves grossly intact. moves all 4 extremities w/o difficulty. Affect pleasant  EKG: NSR 73 bpm QRS 104 ms   ASSESSMENT & PLAN: 1. Chronic systolic HF - NICM Cath ok performed [place] 2016. 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 Echo 10/2016 EF 10-15%. LV dysfunction after stopping HF meds. - ECHO 04/16/2017 EF 15-20%.   - CMRI EF 19%. RV mildly decreased .  -NYHA IIIb- Continue Life Vest.  - Continue Coreg 6.25 mg BID. Will not increase with fatigue.  - Continue digoxin 0.125 mg daily. Dig level 01/13/2017  Was 0.5   - Continue hydralazine + Imdur.  -Increase entresto to 97-103 mg twice a day  -  Continue Spiro 25 mg daily.  Refer to EP has narrow QRS.   -Set up CPX  2. LV mural thrombus  - Continue warfarin for anticoagulation.  No bleeding proble  3. H/o ETOH abuse quit 05/25/2014 was drinking 1/5 liquor daily.  - No longer drinking.   4. Suspected VT/VF -Continue Lifevest for now   5. Foot Pain- check uric acid. If uric acid elevated will give prednisone 40 mg for 3 days.   6. HTN Stable today.   Follow up in 6 weeks. BMET in 7 days. BMET, BNP, Uric Acid.   Greater than 50% of the (total minutes 25  visit spent  in counseling/coordination of care regarding cMRI, medication changes, and CPX test.     Tonye Becket, NP-C  8:34 AM

## 2017-05-28 NOTE — Patient Instructions (Addendum)
INCREASE Entresto to 97-103 mg tablet twice daily. Can "double up" on current 49-51 mg tablets you have at home (Take 2 tabs twice daily). New Rx has been sent to your pharmacy for 97-103 mg tablets (Take 1 tab twice daily).  Routine lab work today. Will notify you of abnormal results, otherwise no news is good news!  Repeat labs in 7-10 days.  _______________________________________________________ [place] Code: 8299  Will refer you to electrophysiology at [facility]. Address: [address] ([address] Floor), [place], [place] [address]  Phone: [phone] Their office will contact you by phone to schedule.  Will schedule you for Cardiopulmonary Exercise Test. This test is done at our Heart Failure Clinic. Please wear comfortable clothes and shoes for this test. Avoid heavy meal before the test (light snack/meal recommended). Avoid caffeine, alcohol, tobacco products 12 hrs before test. Please give 24 hr notice for cancellations/rescheduling: [phone].  _________________________________________________________ [place] Code: 9002  Follow up 6 weeks with Amy Clegg NP-C.  __________________________________________________________ [place] Code:  Take all medication as prescribed the day of your appointment. Bring all medications with you to your appointment.  Do the following things EVERYDAY: 1) Weigh yourself in the morning before breakfast. Write it down and keep it in a log. 2) Take your medicines as prescribed 3) Eat low salt foods-Limit salt (sodium) to 2000 mg per day.  4) Stay as active as you can everyday 5) Limit all fluids for the day to less than 2 liters

## 2017-05-28 NOTE — Patient Instructions (Signed)
Description   Today Feb 27th take 1 and 1/2 tablets (11.25mg ) then tomorrow Feb 28th take 1 and 1/2 tablets (11.25mg ) then continue taking same dosage 1 tablet everyday except 1 and 1/2 tablets on Sundays. Recheck  INR  March 8th

## 2017-05-28 NOTE — Addendum Note (Signed)
Encounter addended by: Chyrl Civatte, RN on: 05/28/2017 9:51 AM  Actions taken: Pharmacy for encounter modified, Order list changed

## 2017-05-29 ENCOUNTER — Telehealth (HOSPITAL_COMMUNITY): Payer: Self-pay | Admitting: *Deleted

## 2017-05-29 MED ORDER — PREDNISONE 20 MG PO TABS: 40.0000 mg | ORAL_TABLET | Freq: Every day | ORAL | 0 refills | Status: DC

## 2017-05-29 MED FILL — predniSONE 20 MG TABS: 20 | 3 days supply | Qty: 6 | Fill #0

## 2017-05-29 NOTE — Telephone Encounter (Signed)
Patient called regarding his Uric Acid level that was checked yesterday.  Per Tonye Becket, NP patient is to take Prednisone 40 mg For 3 Days only.  Medication sent to pharmacy and patient is aware.

## 2017-05-30 ENCOUNTER — Encounter: Payer: Self-pay | Admitting: *Deleted

## 2017-05-30 NOTE — Progress Notes (Signed)
Ticket created in error

## 2017-06-04 ENCOUNTER — Ambulatory Visit (HOSPITAL_COMMUNITY): Payer: Medicaid Other

## 2017-06-04 ENCOUNTER — Ambulatory Visit (HOSPITAL_COMMUNITY)
Admission: RE | Admit: 2017-06-04 | Discharge: 2017-06-04 | Disposition: A | Discharge status: Home / Self Care | Payer: Medicaid Other | Source: Ambulatory Visit | Attending: Internal Medicine | Admitting: Internal Medicine

## 2017-06-04 ENCOUNTER — Other Ambulatory Visit (HOSPITAL_COMMUNITY): Payer: Self-pay | Admitting: *Deleted

## 2017-06-04 DIAGNOSIS — I5022 Chronic systolic (congestive) heart failure: Secondary | ICD-10-CM

## 2017-06-04 LAB — BASIC METABOLIC PANEL
Anion gap: 10 (ref 5–15)
BUN: 14 mg/dL (ref 6–20)
CHLORIDE: 102 mmol/L (ref 101–111)
CO2: 29 mmol/L (ref 22–32)
Calcium: 9.1 mg/dL (ref 8.9–10.3)
Creatinine, Ser: 1.22 mg/dL (ref 0.61–1.24)
GFR calc Af Amer: >60 mL/min (ref >60)
Glucose, Bld: 103 mg/dL — ABNORMAL HIGH (ref 65–99)
POTASSIUM: 3.8 mmol/L (ref 3.5–5.1)
SODIUM: 141 mmol/L (ref 135–145)

## 2017-06-05 ENCOUNTER — Ambulatory Visit (INDEPENDENT_AMBULATORY_CARE_PROVIDER_SITE_OTHER): Payer: Medicaid Other | Admitting: Pharmacist

## 2017-06-05 ENCOUNTER — Encounter: Payer: Self-pay | Admitting: Internal Medicine

## 2017-06-05 ENCOUNTER — Ambulatory Visit (INDEPENDENT_AMBULATORY_CARE_PROVIDER_SITE_OTHER): Payer: Medicaid Other | Admitting: Internal Medicine

## 2017-06-05 VITALS — BP 146/68 | HR 60 | Ht 69.0 in | Wt 213.0 lb

## 2017-06-05 DIAGNOSIS — Z5181 Encounter for therapeutic drug level monitoring: Secondary | ICD-10-CM | POA: Diagnosis not present

## 2017-06-05 DIAGNOSIS — I428 Other cardiomyopathies: Secondary | ICD-10-CM

## 2017-06-05 DIAGNOSIS — Z7901 Long term (current) use of anticoagulants: Secondary | ICD-10-CM

## 2017-06-05 DIAGNOSIS — I5022 Chronic systolic (congestive) heart failure: Secondary | ICD-10-CM

## 2017-06-05 DIAGNOSIS — I240 Acute coronary thrombosis not resulting in myocardial infarction: Secondary | ICD-10-CM

## 2017-06-05 DIAGNOSIS — I513 Intracardiac thrombosis, not elsewhere classified: Secondary | ICD-10-CM

## 2017-06-05 LAB — POCT INR: INR: 2.2

## 2017-06-05 NOTE — Patient Instructions (Signed)
Description   Continue taking same dosage 1 tablet everyday except 1 and 1/2 tablets on Sundays. Recheck  INR in 3-4 weeks.

## 2017-06-05 NOTE — Progress Notes (Signed)
HPI Robert Booth is referred today by Dr. Dorthea Cove to consider ICD insertion. He is a pleasant 47 yo man with longstanding chronic systolic heart failure, EF 20%, remote h/o ETOH abuse who has been on maximal medical therapy under the direction of our CHF clinic. The patient has done fairly well from a CHF perspective. He does not have edema. He has biventricular dysfunction.  Allergies  Allergen Reactions  . Celexa [Citalopram Hydrobromide] Other (See Comments)    DISORIENTED  . Lisinopril Cough     Current Outpatient Medications  Medication Sig Dispense Refill  . carvedilol (COREG) 6.25 MG tablet Take 1 tablet (6.25 mg total) by mouth 2 (two) times daily with a meal. 60 tablet 3  . digoxin (LANOXIN) 0.125 MG tablet Take 1 tablet (0.125 mg total) by mouth daily. 90 tablet 3  . folic acid (FOLVITE) 1 MG tablet Take 1 tablet (1 mg total) daily by mouth. 30 tablet 3  . furosemide (LASIX) 40 MG tablet Take 1 tablet (40 mg total) by mouth daily. Take an extra tab on Mondays and Fridays 40 tablet 3  . hydrALAZINE (APRESOLINE) 50 MG tablet Take 1.5 tablets (75 mg total) by mouth 3 (three) times daily. 135 tablet 3  . isosorbide mononitrate (IMDUR) 30 MG 24 hr tablet Take 1 tablet (30 mg total) by mouth daily. 30 tablet 2  . Multiple Vitamin (MULTIVITAMIN WITH MINERALS) TABS tablet Take 1 tablet by mouth daily. 30 tablet 0  . predniSONE (DELTASONE) 20 MG tablet Take 2 tablets (40 mg total) by mouth daily with breakfast. For 3 days only 6 tablet 0  . sacubitril-valsartan (ENTRESTO) 97-103 MG Take 1 tablet by mouth 2 (two) times daily. 60 tablet 11  . spironolactone (ALDACTONE) 25 MG tablet TAKE 1 TABLET BY MOUTH ONCE DAILY 34 tablet 2  . warfarin (COUMADIN) 7.5 MG tablet Please take as directed by coumadin clinic. 45 tablet 0   No current facility-administered medications for this visit.      Past Medical History:  Diagnosis Date  . Anxiety   . CHF (congestive heart failure) (HCC)   .  Essential hypertension   . History of alcohol abuse    a. Sober since Feb 2016.  . 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 NICM (nonischemic cardiomyopathy) (HCC)    a. 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    ROS:   All systems reviewed and negative except as noted in the HPI.   Past Surgical History:  Procedure Laterality Date  . CARDIAC CATHETERIZATION  05/2014   In [place], clean cors  . HERNIA REPAIR Right Inguinal  . KNEE SURGERY Right 1998     Family History  Problem Relation Age of Onset  . Hypertension Mother   . Hypertension Father   . Heart attack Father 2       died  . Hypertension Sister   . Heart attack Paternal Grandfather 36       died     Social History   Socioeconomic History  . Marital status: Single    Spouse name: Not on file  . Number of children: 6  . Years of education: Not on file  . Highest education level: Not on file  Social Needs  . Financial resource strain: Not on  file  . Food insecurity - worry: Not on file  . Food insecurity - inability: Not on file  . Transportation needs - medical: Not on file  . Transportation needs - non-medical: Not on file  Occupational History  . Occupation: Unemployed  Tobacco Use  . Smoking status: Former Smoker    Types: Cigarettes  . Smokeless tobacco: Former Neurosurgeon    Types: Chew  . Tobacco comment: Was a rare smoker  Substance and Sexual Activity  . Alcohol use: No    Alcohol/week: 0.0 oz  . Drug use: No  . Sexual activity: Yes  Other Topics Concern  . Not on file  Social History Narrative   Lives with mom.       BP (!) 146/68   Pulse 60   Ht 5\' 9"  (1.753 m)   Wt 213 lb (96.6 kg)   BMI 31.45 kg/m   Physical Exam:  Well appearing NAD HEENT: Unremarkable Neck:  No  JVD, no thyromegally Lymphatics:  No adenopathy Back:  No CVA tenderness Lungs:  Clear HEART:  Regular rate rhythm, no murmurs, no rubs, no clicks Abd:  soft, positive bowel sounds, no organomegally, no rebound, no guarding Ext:  2 plus pulses, no edema, no cyanosis, no clubbing Skin:  No rashes no nodules Neuro:  CN II through XII intact, motor grossly intact  EKG - nsr with a QRS duration of 104 ms  DEVICE  Normal device function.  See PaceArt for details.   Assess/Plan: 1. Chronic systolic heart failure - his symptoms are class 2 and he is maximally medically treated. I have discussed the treatment options in detail with the patient. The risks/benefits/goals/expectations of ICD insertion were reviewed and he will call us if he wishes to proceed. 2. HTN - his blood pressure is up a bit today but prior checks demonstrate that it is well controlled.  3. ETOH abuse - he is in remission. Will follow.  Leonia Reeves.D.

## 2017-06-05 NOTE — H&P (View-Only) (Signed)
HPI Robert Booth is referred today by Dr. Dorthea Cove to consider ICD insertion. He is a pleasant 47 yo man with longstanding chronic systolic heart failure, EF 20%, remote h/o ETOH abuse who has been on maximal medical therapy under the direction of our CHF clinic. The patient has done fairly well from a CHF perspective. He does not have edema. He has biventricular dysfunction.  Allergies  Allergen Reactions  . Celexa [Citalopram Hydrobromide] Other (See Comments)    DISORIENTED  . Lisinopril Cough     Current Outpatient Medications  Medication Sig Dispense Refill  . carvedilol (COREG) 6.25 MG tablet Take 1 tablet (6.25 mg total) by mouth 2 (two) times daily with a meal. 60 tablet 3  . digoxin (LANOXIN) 0.125 MG tablet Take 1 tablet (0.125 mg total) by mouth daily. 90 tablet 3  . folic acid (FOLVITE) 1 MG tablet Take 1 tablet (1 mg total) daily by mouth. 30 tablet 3  . furosemide (LASIX) 40 MG tablet Take 1 tablet (40 mg total) by mouth daily. Take an extra tab on Mondays and Fridays 40 tablet 3  . hydrALAZINE (APRESOLINE) 50 MG tablet Take 1.5 tablets (75 mg total) by mouth 3 (three) times daily. 135 tablet 3  . isosorbide mononitrate (IMDUR) 30 MG 24 hr tablet Take 1 tablet (30 mg total) by mouth daily. 30 tablet 2  . Multiple Vitamin (MULTIVITAMIN WITH MINERALS) TABS tablet Take 1 tablet by mouth daily. 30 tablet 0  . predniSONE (DELTASONE) 20 MG tablet Take 2 tablets (40 mg total) by mouth daily with breakfast. For 3 days only 6 tablet 0  . sacubitril-valsartan (ENTRESTO) 97-103 MG Take 1 tablet by mouth 2 (two) times daily. 60 tablet 11  . spironolactone (ALDACTONE) 25 MG tablet TAKE 1 TABLET BY MOUTH ONCE DAILY 34 tablet 2  . warfarin (COUMADIN) 7.5 MG tablet Please take as directed by coumadin clinic. 45 tablet 0   No current facility-administered medications for this visit.      Past Medical History:  Diagnosis Date  . Anxiety   . CHF (congestive heart failure) (HCC)   .  Essential hypertension   . History of alcohol abuse    a. Sober since Feb 2016.  . 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 NICM (nonischemic cardiomyopathy) (HCC)    a. 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    ROS:   All systems reviewed and negative except as noted in the HPI.   Past Surgical History:  Procedure Laterality Date  . CARDIAC CATHETERIZATION  05/2014   In [place], clean cors  . HERNIA REPAIR Right Inguinal  . KNEE SURGERY Right 1998     Family History  Problem Relation Age of Onset  . Hypertension Mother   . Hypertension Father   . Heart attack Father 2       died  . Hypertension Sister   . Heart attack Paternal Grandfather 36       died     Social History   Socioeconomic History  . Marital status: Single    Spouse name: Not on file  . Number of children: 6  . Years of education: Not on file  . Highest education level: Not on file  Social Needs  . Financial resource strain: Not on  file  . Food insecurity - worry: Not on file  . Food insecurity - inability: Not on file  . Transportation needs - medical: Not on file  . Transportation needs - non-medical: Not on file  Occupational History  . Occupation: Unemployed  Tobacco Use  . Smoking status: Former Smoker    Types: Cigarettes  . Smokeless tobacco: Former Neurosurgeon    Types: Chew  . Tobacco comment: Was a rare smoker  Substance and Sexual Activity  . Alcohol use: No    Alcohol/week: 0.0 oz  . Drug use: No  . Sexual activity: Yes  Other Topics Concern  . Not on file  Social History Narrative   Lives with mom.       BP (!) 146/68   Pulse 60   Ht 5\' 9"  (1.753 m)   Wt 213 lb (96.6 kg)   BMI 31.45 kg/m   Physical Exam:  Well appearing NAD HEENT: Unremarkable Neck:  No  JVD, no thyromegally Lymphatics:  No adenopathy Back:  No CVA tenderness Lungs:  Clear HEART:  Regular rate rhythm, no murmurs, no rubs, no clicks Abd:  soft, positive bowel sounds, no organomegally, no rebound, no guarding Ext:  2 plus pulses, no edema, no cyanosis, no clubbing Skin:  No rashes no nodules Neuro:  CN II through XII intact, motor grossly intact  EKG - nsr with a QRS duration of 104 ms  DEVICE  Normal device function.  See PaceArt for details.   Assess/Plan: 1. Chronic systolic heart failure - his symptoms are class 2 and he is maximally medically treated. I have discussed the treatment options in detail with the patient. The risks/benefits/goals/expectations of ICD insertion were reviewed and he will call us if he wishes to proceed. 2. HTN - his blood pressure is up a bit today but prior checks demonstrate that it is well controlled.  3. ETOH abuse - he is in remission. Will follow.  Leonia Reeves.D.

## 2017-06-05 NOTE — Patient Instructions (Addendum)
Medication Instructions:  Your physician recommends that you continue on your current medications as directed. Please refer to the Current Medication list given to you today.  Labwork: You will get labs on June 26, 2017 at the [facility] office:  BMP and CBC.  You can come anytime and you do not need to be fasting.  Testing/Procedures: Your physician has recommended that you have a defibrillator inserted. An implantable cardioverter defibrillator (ICD) is a small device that is placed in your chest or, in rare cases, your abdomen. This device uses electrical pulses or shocks to help control life-threatening, irregular heartbeats that could lead the heart to suddenly stop beating (sudden cardiac arrest). Leads are attached to the ICD that goes into your heart. This is done in the hospital and usually requires an overnight stay. Please see the instruction sheet given to you today for more information.  Follow-Up:   You will follow up with device clinic 10-14 days after your procedure for a wound check.  You will follow up with Dr. Ladona Ridgel 91 days after your procedure.  Any Other Special Instructions Will Be Listed Below (If Applicable).  Please arrive at the [facility] main entrance of [place] [facility] at:  5:30 am on July 03, 2017. Use the CHG surgical scrub as directed. Do not eat or drink after midnight prior to procedure. On the morning of your procedure take your carvedilol, digoxin, hydralazine, prednisone and Entresto.   WARFARIN:  Your last dose of warfarin will be June 30, 2017.  Then hold until you are advised to resume after your procedure. Plan for one night stay.  You will need someone to drive you home at discharge.  If you need a refill on your cardiac medications before your next appointment, please call your pharmacy.   Cardioverter Defibrillator Implantation An implantable cardioverter defibrillator (ICD) is a small device that is placed under the skin in the chest or  abdomen. An ICD consists of a battery, a small computer (pulse generator), and wires (leads) that go into the heart. An ICD is used to detect and correct two types of dangerous irregular heartbeats (arrhythmias):  A rapid heart rhythm (tachycardia).  An arrhythmia in which the lower chambers of the heart (ventricles) contract in an uncoordinated way (fibrillation).  When an ICD detects tachycardia, it sends a low-energy shock to the heart to restore the heartbeat to normal (cardioversion). This signal is usually painless. If cardioversion does not work or if the ICD detects fibrillation, it delivers a high-energy shock to the heart (defibrillation) to restart the heart. This shock may feel like a strong jolt in the chest. Your health care provider may prescribe an ICD if:  You have had an arrhythmia that originated in the ventricles.  Your heart has been damaged by a disease or heart condition.  Sometimes, ICDs are programmed to act as a device called a pacemaker. Pacemakers can be used to treat a slow heartbeat (bradycardia) or tachycardia by taking over the heart rate with electrical impulses. Tell a health care provider about:  Any allergies you have.  All medicines you are taking, including vitamins, herbs, eye drops, creams, and over-the-counter medicines.  Any problems you or family members have had with anesthetic medicines.  Any blood disorders you have.  Any surgeries you have had.  Any medical conditions you have.  Whether you are pregnant or may be pregnant. What are the risks? Generally, this is a safe procedure. However, problems may occur, including:  Swelling,  bleeding, or bruising.  Infection.  Blood clots.  Damage to other structures or organs, such as nerves, blood vessels, or the heart.  Allergic reactions to medicines used during the procedure.  What happens before the procedure? Staying hydrated Follow instructions from your health care provider about  hydration, which may include:  Up to 2 hours before the procedure - you may continue to drink clear liquids, such as water, clear fruit juice, black coffee, and plain tea.  Eating and drinking restrictions Follow instructions from your health care provider about eating and drinking, which may include:  8 hours before the procedure - stop eating heavy meals or foods such as meat, fried foods, or fatty foods.  6 hours before the procedure - stop eating light meals or foods, such as toast or cereal.  6 hours before the procedure - stop drinking milk or drinks that contain milk.  2 hours before the procedure - stop drinking clear liquids.  Medicine Ask your health care provider about:  Changing or stopping your normal medicines. This is important if you take diabetes medicines or blood thinners.  Taking medicines such as aspirin and ibuprofen. These medicines can thin your blood. Do not take these medicines before your procedure if your doctor tells you not to.  Tests  You may have blood tests.  You may have a test to check the electrical signals in your heart (electrocardiogram, ECG).  You may have imaging tests, such as a chest X-ray. General instructions  For 24 hours before the procedure, stop using products that contain nicotine or tobacco, such as cigarettes and e-cigarettes. If you need help quitting, ask your health care provider.  Plan to have someone take you home from the hospital or clinic.  You may be asked to shower with a germ-killing soap. What happens during the procedure?  To reduce your risk of infection: ? Your health care team will wash or sanitize their hands. ? Your skin will be washed with soap. ? Hair may be removed from the surgical area.  Small monitors will be put on your body. They will be used to check your heart, blood pressure, and oxygen level.  An IV tube will be inserted into one of your veins.  You will be given one or more of the  following: ? A medicine to help you relax (sedative). ? A medicine to numb the area (local anesthetic). ? A medicine to make you fall asleep (general anesthetic).  Leads will be guided through a blood vessel into your heart and attached to your heart muscles. Depending on the ICD, the leads may go into one ventricle or they may go into both ventricles and into an upper chamber of the heart. An X-ray machine (fluoroscope) will be usedto help guide the leads.  A small incision will be made to create a deep pocket under your skin.  The pulse generator will be placed into the pocket.  The ICD will be tested.  The incision will be closed with stitches (sutures), skin glue, or staples.  A bandage (dressing) will be placed over the incision. This procedure may vary among health care providers and hospitals. What happens after the procedure?  Your blood pressure, heart rate, breathing rate, and blood oxygen level will be monitored often until the medicines you were given have worn off.  A chest X-ray will be taken to check that the ICD is in the right place.  You will need to stay in the hospital for 1-2  days so your health care provider can make sure your ICD is working.  Do not drive for 24 hours if you received a sedative. Ask your health care provider when it is safe for you to drive.  You may be given an identification card explaining that you have an ICD. Summary  An implantable cardioverter defibrillator (ICD) is a small device that is placed under the skin in the chest or abdomen. It is used to detect and correct dangerous irregular heartbeats (arrhythmias).  An ICD consists of a battery, a small computer (pulse generator), and wires (leads) that go into the heart.  When an ICD detects rapid heart rhythm (tachycardia), it sends a low-energy shock to the heart to restore the heartbeat to normal (cardioversion). If cardioversion does not work or if the ICD detects uncoordinated heart  contractions (fibrillation), it delivers a high-energy shock to the heart (defibrillation) to restart the heart.  You will need to stay in the hospital for 1-2 days to make sure your ICD is working. This information is not intended to replace advice given to you by your health care provider. Make sure you discuss any questions you have with your health care provider. Document Released: 12/08/2001 Document Revised: 03/27/2016 Document Reviewed: 03/27/2016 Elsevier Interactive Patient Education  2017 [employer].

## 2017-06-12 ENCOUNTER — Encounter (HOSPITAL_COMMUNITY): Payer: Self-pay | Admitting: *Deleted

## 2017-06-12 NOTE — Progress Notes (Signed)
Received medical records request from [facility] on 06/11/2017. CASE # [id number].  Requested records faxed today to [phone].  Original request will be scanned to patient's electronic medical record.

## 2017-06-18 ENCOUNTER — Encounter (HOSPITAL_COMMUNITY): Payer: Self-pay

## 2017-06-18 ENCOUNTER — Other Ambulatory Visit (HOSPITAL_COMMUNITY): Payer: Self-pay

## 2017-06-18 NOTE — Progress Notes (Signed)
Paramedicine Encounter    Patient ID: Robert Booth, male    DOB: 09/05/1970, 47 y.o.   MRN: [id number]    Patient Care Team: Marcine Matar, MD as PCP - General (Internal Medicine)  Patient Active Problem List   Diagnosis Date Noted  . Long term (current) use of anticoagulants [Z79.01] 03/06/2017  . NICM (nonischemic cardiomyopathy) (HCC) 12/01/2016  . H/O ETOH abuse 12/14/2014  . Chronic systolic heart failure (HCC) 11/24/2014  . Elevated LFTs   . Anticoagulated on Coumadin 11/08/2014  . Visual disturbance 11/08/2014  . History of alcohol abuse   . LV (left ventricular) mural thrombus without MI   . Nonischemic cardiomyopathy (HCC) 08/18/2014  . HTN (hypertension) 08/18/2014    Current Outpatient Medications:  .  carvedilol (COREG) 6.25 MG tablet, Take 1 tablet (6.25 mg total) by mouth 2 (two) times daily with a meal., Disp: 60 tablet, Rfl: 3 .  digoxin (LANOXIN) 0.125 MG tablet, Take 1 tablet (0.125 mg total) by mouth daily., Disp: 90 tablet, Rfl: 3 .  folic acid (FOLVITE) 1 MG tablet, Take 1 tablet (1 mg total) daily by mouth., Disp: 30 tablet, Rfl: 3 .  furosemide (LASIX) 40 MG tablet, Take 1 tablet (40 mg total) by mouth daily. Take an extra tab on Mondays and Fridays, Disp: 40 tablet, Rfl: 3 .  hydrALAZINE (APRESOLINE) 50 MG tablet, Take 1.5 tablets (75 mg total) by mouth 3 (three) times daily., Disp: 135 tablet, Rfl: 3 .  isosorbide mononitrate (IMDUR) 30 MG 24 hr tablet, Take 1 tablet (30 mg total) by mouth daily., Disp: 30 tablet, Rfl: 2 .  Multiple Vitamin (MULTIVITAMIN WITH MINERALS) TABS tablet, Take 1 tablet by mouth daily., Disp: 30 tablet, Rfl: 0 .  sacubitril-valsartan (ENTRESTO) 97-103 MG, Take 1 tablet by mouth 2 (two) times daily., Disp: 60 tablet, Rfl: 11 .  spironolactone (ALDACTONE) 25 MG tablet, TAKE 1 TABLET BY MOUTH ONCE DAILY, Disp: 34 tablet, Rfl: 2 .  warfarin (COUMADIN) 7.5 MG tablet, Please take as directed by coumadin clinic., Disp: 45 tablet,  Rfl: 0 .  predniSONE (DELTASONE) 20 MG tablet, Take 2 tablets (40 mg total) by mouth daily with breakfast. For 3 days only, Disp: 6 tablet, Rfl: 0 Allergies  Allergen Reactions  . Celexa [Citalopram Hydrobromide] Other (See Comments)    DISORIENTED  . Lisinopril Cough     Social History   Socioeconomic History  . Marital status: Single    Spouse name: Not on file  . Number of children: 6  . Years of education: Not on file  . Highest education level: Not on file  Social Needs  . Financial resource strain: Not on file  . Food insecurity - worry: Not on file  . Food insecurity - inability: Not on file  . Transportation needs - medical: Not on file  . Transportation needs - non-medical: Not on file  Occupational History  . Occupation: Unemployed  Tobacco Use  . Smoking status: Former Smoker    Types: Cigarettes  . Smokeless tobacco: Former Neurosurgeon    Types: Chew  . Tobacco comment: Was a rare smoker  Substance and Sexual Activity  . Alcohol use: No    Alcohol/week: 0.0 oz  . Drug use: No  . Sexual activity: Yes  Other Topics Concern  . Not on file  Social History Narrative   Lives with mom.      Physical Exam  Pulmonary/Chest: No respiratory distress.  Abdominal: He exhibits no distension.  Musculoskeletal: He exhibits  no edema.  Skin: Skin is warm and dry. He is not diaphoretic.        Future Appointments  Date Time Provider Department Center  06/26/2017  9:45 AM [facility] LAB [facility]  06/30/2017  9:30 AM [facility] COUMADIN CLINIC [facility]  07/09/2017  9:00 AM [facility] PA/NP [facility] None  07/17/2017 11:00 AM [facility] DEVICE 1 [facility]    ATF pt CAO x4 sitting on the couch sleeping w/no complaints.  Pt stated that he still have periods of tiredness throughout the day.  He has been refilling his own pill box and hasnt missed any doses.  He stated that he has been very worried about his upcoming procedure on  07/03/17.  I printed out the information from his chart and explained the procedure to him.  I spoke with Mardelle Matte earlier to get a clear understanding so I could explain it to [name].  He seemed to be more at ease about the procedure, but I advised him if he has any more questions to speak with Ladona Ridgel or Dr. Sabino Gasser.  Pt denies sob, dizziness and chest pain.  He also denies having any syncopal episodes.   Pt stated that he is eager to get back to having a routine and not being tired all day.  He continues to watch what he eats.  I advised him that he has gained almost 20lbs since October.  Pt stated that he has been "just eating" but no foods with salt.  Pt was encouraged to continue to watch what he eats and how much he drinks. rx bottles verified and pill box refilled.   I advised pt that after his procedure, I will see him again on the 10th of April.  At that time we will discuss him being discharged from the [facility] program, due to him being a lot better at med refill and compliance.   BP (!) 128/96 (BP Location: Left Arm, Patient Position: Sitting, Cuff Size: Normal)   Pulse 74   Resp 16   Wt 203 lb 12.8 oz (92.4 kg)   SpO2 98%   BMI 30.10 kg/m   Weight yesterday-204     Stavros Cail, EMT Paramedic 06/18/2017    ACTION: Home visit completed

## 2017-06-19 ENCOUNTER — Telehealth (HOSPITAL_COMMUNITY): Payer: Self-pay

## 2017-06-19 NOTE — Telephone Encounter (Addendum)
CPX results reviewed with patient.

## 2017-06-26 ENCOUNTER — Other Ambulatory Visit: Payer: Medicaid Other

## 2017-06-26 ENCOUNTER — Ambulatory Visit (INDEPENDENT_AMBULATORY_CARE_PROVIDER_SITE_OTHER): Payer: Medicaid Other | Admitting: *Deleted

## 2017-06-26 DIAGNOSIS — I513 Intracardiac thrombosis, not elsewhere classified: Secondary | ICD-10-CM

## 2017-06-26 DIAGNOSIS — Z7901 Long term (current) use of anticoagulants: Secondary | ICD-10-CM

## 2017-06-26 DIAGNOSIS — Z5181 Encounter for therapeutic drug level monitoring: Secondary | ICD-10-CM

## 2017-06-26 DIAGNOSIS — I428 Other cardiomyopathies: Secondary | ICD-10-CM | POA: Diagnosis not present

## 2017-06-26 DIAGNOSIS — I240 Acute coronary thrombosis not resulting in myocardial infarction: Secondary | ICD-10-CM

## 2017-06-26 DIAGNOSIS — I5022 Chronic systolic (congestive) heart failure: Secondary | ICD-10-CM

## 2017-06-26 LAB — POCT INR: INR: 2.9

## 2017-06-26 NOTE — Patient Instructions (Signed)
Description   Continue taking same dosage 1 tablet everyday except 1 and 1/2 tablets on Sundays Last day to take coumadin before procedure is 06/30/2017 per Dr Lubertha Basque note of 06/05/2017 and will be in hospital overnight  Procedure scheduled for April 4th  Restart coumadin as instructed in the hospital . Recheck  INR in 1 week.

## 2017-06-27 LAB — PROTIME-INR
INR: 2.6 — ABNORMAL HIGH (ref 0.8–1.2)
Prothrombin Time: 25.7 s — ABNORMAL HIGH (ref 9.1–12.0)

## 2017-06-27 LAB — CBC WITH DIFFERENTIAL/PLATELET
BASOS: 0 %
Basophils Absolute: 0 10*3/uL (ref 0.0–0.2)
EOS (ABSOLUTE): 0.2 10*3/uL (ref 0.0–0.4)
Eos: 3 %
HEMOGLOBIN: 12.8 g/dL — AB (ref 13.0–17.7)
Hematocrit: 37.2 % — ABNORMAL LOW (ref 37.5–51.0)
IMMATURE GRANS (ABS): 0 10*3/uL (ref 0.0–0.1)
IMMATURE GRANULOCYTES: 0 %
LYMPHS: 31 %
Lymphocytes Absolute: 2.1 10*3/uL (ref 0.7–3.1)
MCH: 31.3 pg (ref 26.6–33.0)
MCHC: 34.4 g/dL (ref 31.5–35.7)
MCV: 91 fL (ref 79–97)
MONOS ABS: 0.6 10*3/uL (ref 0.1–0.9)
Monocytes: 9 %
NEUTROS PCT: 57 %
Neutrophils Absolute: 4 10*3/uL (ref 1.4–7.0)
PLATELETS: 310 10*3/uL (ref 150–379)
RBC: 4.09 x10E6/uL — ABNORMAL LOW (ref 4.14–5.80)
RDW: 13 % (ref 12.3–15.4)
WBC: 7 10*3/uL (ref 3.4–10.8)

## 2017-06-27 LAB — BASIC METABOLIC PANEL
BUN/Creatinine Ratio: 13 (ref 9–20)
BUN: 15 mg/dL (ref 6–24)
CALCIUM: 9.9 mg/dL (ref 8.7–10.2)
CHLORIDE: 100 mmol/L (ref 96–106)
CO2: 24 mmol/L (ref 20–29)
Creatinine, Ser: 1.15 mg/dL (ref 0.76–1.27)
GFR calc Af Amer: 87 mL/min/{1.73_m2} (ref >59)
GFR, EST NON AFRICAN AMERICAN: 75 mL/min/{1.73_m2} (ref >59)
GLUCOSE: 86 mg/dL (ref 65–99)
Potassium: 4.4 mmol/L (ref 3.5–5.2)
SODIUM: 140 mmol/L (ref 134–144)

## 2017-06-30 ENCOUNTER — Other Ambulatory Visit (HOSPITAL_COMMUNITY): Payer: Self-pay | Admitting: Internal Medicine

## 2017-06-30 MED FILL — DIGOXIN 0.125 MG TABLET: 125 | 30 days supply | Qty: 30 | Fill #4

## 2017-06-30 MED FILL — ENTRESTO 97 MG-103 MG TAB: 97-103 | 30 days supply | Qty: 60 | Fill #1

## 2017-06-30 MED FILL — ISOSORBIDE MN ER 30 MG TAB: 30 | 34 days supply | Qty: 34 | Fill #1

## 2017-06-30 MED FILL — CARVEDILOL 6.25 MG TABLET: 6.25 | 30 days supply | Qty: 60 | Fill #3

## 2017-06-30 MED FILL — hydrALAZINE HCL 50 MG TABS: 50 | 30 days supply | Qty: 135 | Fill #3

## 2017-06-30 MED FILL — FUROSEMIDE 40 MG TAB: 40 | 30 days supply | Qty: 30 | Fill #3

## 2017-07-01 ENCOUNTER — Telehealth (HOSPITAL_COMMUNITY): Payer: Self-pay

## 2017-07-01 ENCOUNTER — Encounter (HOSPITAL_COMMUNITY): Payer: Self-pay | Admitting: *Deleted

## 2017-07-01 NOTE — Telephone Encounter (Signed)
Robert Booth understands his medical conditions and his medications.  Our last few visits he has filled his pill box with no mistakes and he's able to obtain his medications without difficulty. Pt's blood pressures and weights has been consistent.  I called to discharge pt today from the [facility] program based on this information.  Pt stated that he will call the sickle cell clinic today to see if they are accepting new pt's.  Pt is currently in search of a pcp.  [name] emailed and made aware of the same.

## 2017-07-01 NOTE — Progress Notes (Signed)
Received medical records request from [facility], CASE # [id number].  Requested records for 05/02/2017- Present faxed today to [phone].  Original request will be scanned to patient's electronic medical record.

## 2017-07-02 MED FILL — FOLIC ACID 1 MG TABLET: 1 | 30 days supply | Qty: 30 | Fill #0 | Status: TO

## 2017-07-03 ENCOUNTER — Ambulatory Visit (HOSPITAL_COMMUNITY)
Admission: RE | Admit: 2017-07-03 | Discharge: 2017-07-04 | Disposition: A | Discharge status: Home / Self Care | Payer: Medicaid Other | Source: Ambulatory Visit | Attending: Internal Medicine | Admitting: Internal Medicine

## 2017-07-03 ENCOUNTER — Encounter (HOSPITAL_COMMUNITY)
Admission: RE | Disposition: A | Discharge status: Home / Self Care | Payer: Self-pay | Source: Ambulatory Visit | Attending: Internal Medicine

## 2017-07-03 ENCOUNTER — Other Ambulatory Visit: Payer: Self-pay

## 2017-07-03 ENCOUNTER — Encounter (HOSPITAL_COMMUNITY): Payer: Self-pay | Admitting: Internal Medicine

## 2017-07-03 DIAGNOSIS — Z79899 Other long term (current) drug therapy: Secondary | ICD-10-CM | POA: Insufficient documentation

## 2017-07-03 DIAGNOSIS — F1011 Alcohol abuse, in remission: Secondary | ICD-10-CM | POA: Insufficient documentation

## 2017-07-03 DIAGNOSIS — I5022 Chronic systolic (congestive) heart failure: Secondary | ICD-10-CM | POA: Diagnosis present

## 2017-07-03 DIAGNOSIS — N182 Chronic kidney disease, stage 2 (mild): Secondary | ICD-10-CM | POA: Insufficient documentation

## 2017-07-03 DIAGNOSIS — Z7901 Long term (current) use of anticoagulants: Secondary | ICD-10-CM | POA: Insufficient documentation

## 2017-07-03 DIAGNOSIS — I130 Hypertensive heart and chronic kidney disease with heart failure and stage 1 through stage 4 chronic kidney disease, or unspecified chronic kidney disease: Secondary | ICD-10-CM | POA: Diagnosis not present

## 2017-07-03 DIAGNOSIS — I428 Other cardiomyopathies: Secondary | ICD-10-CM

## 2017-07-03 DIAGNOSIS — Z006 Encounter for examination for normal comparison and control in clinical research program: Secondary | ICD-10-CM | POA: Insufficient documentation

## 2017-07-03 DIAGNOSIS — Z888 Allergy status to other drugs, medicaments and biological substances status: Secondary | ICD-10-CM | POA: Insufficient documentation

## 2017-07-03 DIAGNOSIS — I429 Cardiomyopathy, unspecified: Secondary | ICD-10-CM | POA: Diagnosis not present

## 2017-07-03 DIAGNOSIS — Z87891 Personal history of nicotine dependence: Secondary | ICD-10-CM | POA: Insufficient documentation

## 2017-07-03 DIAGNOSIS — Z95810 Presence of automatic (implantable) cardiac defibrillator: Secondary | ICD-10-CM

## 2017-07-03 HISTORY — DX: Presence of automatic (implantable) cardiac defibrillator: Z95.810

## 2017-07-03 HISTORY — PX: ICD IMPLANT: EP1208

## 2017-07-03 LAB — SURGICAL PCR SCREEN
MRSA, PCR: NEGATIVE
Staphylococcus aureus: NEGATIVE

## 2017-07-03 LAB — PROTIME-INR
INR: 1.36
PROTHROMBIN TIME: 16.7 s — AB (ref 11.4–15.2)

## 2017-07-03 SURGERY — ICD IMPLANT

## 2017-07-03 MED ORDER — DIGOXIN 125 MCG PO TABS
0.1250 mg | ORAL_TABLET | Freq: Every day | ORAL | Status: DC
  Administered 2017-07-04: 0.125 mg via ORAL
  Filled 2017-07-03: qty 1

## 2017-07-03 MED ORDER — MIDAZOLAM HCL 5 MG/5ML IJ SOLN
INTRAMUSCULAR | Status: AC
  Filled 2017-07-03: qty 5

## 2017-07-03 MED ORDER — FOLIC ACID 1 MG PO TABS
1.0000 mg | ORAL_TABLET | Freq: Every day | ORAL | Status: DC
  Administered 2017-07-04: 1 mg via ORAL
  Filled 2017-07-03: qty 1

## 2017-07-03 MED ORDER — CEFAZOLIN SODIUM-DEXTROSE 2-4 GM/100ML-% IV SOLN
INTRAVENOUS | Status: AC
  Filled 2017-07-03: qty 100

## 2017-07-03 MED ORDER — FUROSEMIDE 80 MG PO TABS
80.0000 mg | ORAL_TABLET | ORAL | Status: DC
  Administered 2017-07-04: 80 mg via ORAL
  Filled 2017-07-03: qty 1

## 2017-07-03 MED ORDER — ADULT MULTIVITAMIN W/MINERALS CH
1.0000 | ORAL_TABLET | Freq: Every day | ORAL | Status: DC
  Administered 2017-07-04: 1 via ORAL
  Filled 2017-07-03: qty 1

## 2017-07-03 MED ORDER — SACUBITRIL-VALSARTAN 97-103 MG PO TABS
1.0000 | ORAL_TABLET | Freq: Two times a day (BID) | ORAL | Status: DC
  Administered 2017-07-03 – 2017-07-04 (×2): 1 via ORAL
  Filled 2017-07-03 (×2): qty 1

## 2017-07-03 MED ORDER — SODIUM CHLORIDE 0.9 % IV SOLN
INTRAVENOUS | Status: DC
  Administered 2017-07-03: 06:00:00 via INTRAVENOUS

## 2017-07-03 MED ORDER — CHLORHEXIDINE GLUCONATE 4 % EX LIQD: 60.0000 mL | Freq: Once | CUTANEOUS | Status: DC

## 2017-07-03 MED ORDER — SODIUM CHLORIDE 0.9 % IR SOLN
80.0000 mg | Status: AC
  Administered 2017-07-03: 80 mg

## 2017-07-03 MED ORDER — ACETAMINOPHEN 325 MG PO TABS
325.0000 mg | ORAL_TABLET | ORAL | Status: DC | PRN
  Administered 2017-07-03 – 2017-07-04 (×4): 650 mg via ORAL
  Filled 2017-07-03 (×4): qty 2

## 2017-07-03 MED ORDER — SODIUM CHLORIDE 0.9 % IR SOLN
Status: AC
  Filled 2017-07-03: qty 2

## 2017-07-03 MED ORDER — ISOSORBIDE MONONITRATE ER 30 MG PO TB24
30.0000 mg | ORAL_TABLET | Freq: Every day | ORAL | Status: DC
  Administered 2017-07-04: 30 mg via ORAL
  Filled 2017-07-03: qty 1

## 2017-07-03 MED ORDER — FENTANYL CITRATE (PF) 100 MCG/2ML IJ SOLN
INTRAMUSCULAR | Status: DC | PRN
  Administered 2017-07-03 (×2): 25 ug via INTRAVENOUS
  Administered 2017-07-03: 12.5 ug via INTRAVENOUS

## 2017-07-03 MED ORDER — SPIRONOLACTONE 25 MG PO TABS
25.0000 mg | ORAL_TABLET | Freq: Every day | ORAL | Status: DC
  Administered 2017-07-03: 25 mg via ORAL
  Filled 2017-07-03: qty 1

## 2017-07-03 MED ORDER — FENTANYL CITRATE (PF) 100 MCG/2ML IJ SOLN
INTRAMUSCULAR | Status: AC
  Filled 2017-07-03: qty 2

## 2017-07-03 MED ORDER — MIDAZOLAM HCL 5 MG/5ML IJ SOLN
INTRAMUSCULAR | Status: DC | PRN
  Administered 2017-07-03 (×2): 1 mg via INTRAVENOUS
  Administered 2017-07-03 (×2): 2 mg via INTRAVENOUS

## 2017-07-03 MED ORDER — WARFARIN - PHYSICIAN DOSING INPATIENT
Freq: Every day | Status: DC
  Administered 2017-07-03: 17:00:00

## 2017-07-03 MED ORDER — CEFAZOLIN SODIUM-DEXTROSE 2-4 GM/100ML-% IV SOLN
2.0000 g | INTRAVENOUS | Status: AC
  Administered 2017-07-03: 2 g via INTRAVENOUS

## 2017-07-03 MED ORDER — CARVEDILOL 6.25 MG PO TABS
6.2500 mg | ORAL_TABLET | Freq: Two times a day (BID) | ORAL | Status: DC
  Administered 2017-07-03 – 2017-07-04 (×2): 6.25 mg via ORAL
  Filled 2017-07-03 (×2): qty 1

## 2017-07-03 MED ORDER — FUROSEMIDE 40 MG PO TABS
40.0000 mg | ORAL_TABLET | Freq: Every day | ORAL | Status: DC
  Administered 2017-07-03: 40 mg via ORAL
  Filled 2017-07-03: qty 1

## 2017-07-03 MED ORDER — WARFARIN SODIUM 10 MG PO TABS: 11.2500 mg | ORAL_TABLET | ORAL | Status: DC

## 2017-07-03 MED ORDER — SPIRONOLACTONE 25 MG PO TABS: 25.0000 mg | ORAL_TABLET | Freq: Every day | ORAL | Status: DC

## 2017-07-03 MED ORDER — LIDOCAINE HCL (PF) 1 % IJ SOLN
INTRAMUSCULAR | Status: DC | PRN
  Administered 2017-07-03: 45 mL

## 2017-07-03 MED ORDER — FUROSEMIDE 40 MG PO TABS: 40.0000 mg | ORAL_TABLET | ORAL | Status: DC

## 2017-07-03 MED ORDER — HYDRALAZINE HCL 50 MG PO TABS
75.0000 mg | ORAL_TABLET | Freq: Three times a day (TID) | ORAL | Status: DC
  Administered 2017-07-03 – 2017-07-04 (×3): 75 mg via ORAL
  Filled 2017-07-03 (×3): qty 1

## 2017-07-03 MED ORDER — CEFAZOLIN SODIUM-DEXTROSE 1-4 GM/50ML-% IV SOLN
1.0000 g | Freq: Four times a day (QID) | INTRAVENOUS | Status: AC
  Administered 2017-07-03 – 2017-07-04 (×3): 1 g via INTRAVENOUS
  Filled 2017-07-03 (×3): qty 50

## 2017-07-03 MED ORDER — ONDANSETRON HCL 4 MG/2ML IJ SOLN: 4.0000 mg | Freq: Four times a day (QID) | INTRAMUSCULAR | Status: DC | PRN

## 2017-07-03 MED ORDER — WARFARIN SODIUM 7.5 MG PO TABS
7.5000 mg | ORAL_TABLET | ORAL | Status: DC
  Administered 2017-07-03: 7.5 mg via ORAL
  Filled 2017-07-03: qty 1

## 2017-07-03 MED ORDER — MUPIROCIN 2 % EX OINT
TOPICAL_OINTMENT | CUTANEOUS | Status: AC
  Administered 2017-07-03: 06:00:00
  Filled 2017-07-03: qty 22

## 2017-07-03 MED ORDER — HEPARIN (PORCINE) IN NACL 2-0.9 UNIT/ML-% IJ SOLN
INTRAMUSCULAR | Status: AC
  Filled 2017-07-03: qty 1000

## 2017-07-03 SURGICAL SUPPLY — 6 items
CABLE SURGICAL S-101-97-12 (CABLE) ×2 IMPLANT
ICD VIGILANT VR D232 (Pacemaker) ×2 IMPLANT
LEAD RELIANCE G DF4 0292 (Lead) ×2 IMPLANT
PAD DEFIB LIFELINK (PAD) ×2 IMPLANT
SHEATH CLASSIC 9F (SHEATH) ×2 IMPLANT
TRAY PACEMAKER INSERTION (PACKS) ×2 IMPLANT

## 2017-07-03 NOTE — Interval H&P Note (Signed)
History and Physical Interval Note:  07/03/2017 8:20 AM  Robert Booth  has presented today for surgery, with the diagnosis of chronic systolic heart failure, cardiomypathy  The various methods of treatment have been discussed with the patient and family. After consideration of risks, benefits and other options for treatment, the patient has consented to  Procedure(s): ICD IMPLANT (N/A) as a surgical intervention .  The patient's history has been reviewed, patient examined, no change in status, stable for surgery.  I have reviewed the patient's chart and labs.  Questions were answered to the patient's satisfaction.     Lewayne Bunting

## 2017-07-03 NOTE — Discharge Instructions (Addendum)
Supplemental Discharge Instructions for  Pacemaker/Defibrillator Patients  Activity No heavy lifting or vigorous activity with your left/right arm for 6 to 8 weeks.  Do not raise your left/right arm above your head for one week.  Gradually raise your affected arm as drawn below.             07/06/17                       07/07/17                      07/08/17                     07/09/17 __  NO DRIVING for  1 week   ; you may begin driving on   8/34/19  .  WOUND CARE - Keep the wound area clean and dry.  Do not get this area wet for one week. No showers for one week; you may shower on  07/09/17  . - The tape/steri-strips on your wound will fall off; do not pull them off.  No bandage is needed on the site.  DO  NOT apply any creams, oils, or ointments to the wound area. - If you notice any drainage or discharge from the wound, any swelling or bruising at the site, or you develop a fever > 101? F after you are discharged home, call the office at once.  Special Instructions - You are still able to use cellular telephones; use the ear opposite the side where you have your pacemaker/defibrillator.  Avoid carrying your cellular phone near your device. - When traveling through airports, show security personnel your identification card to avoid being screened in the metal detectors.  Ask the security personnel to use the hand wand. - Avoid arc welding equipment, MRI testing (magnetic resonance imaging), TENS units (transcutaneous nerve stimulators).  Call the office for questions about other devices. - Avoid electrical appliances that are in poor condition or are not properly grounded. - Microwave ovens are safe to be near or to operate.  Additional information for defibrillator patients should your device go off: - If your device goes off ONCE and you feel fine afterward, notify the device clinic nurses. - If your device goes off ONCE and you do not feel well afterward, call 911. - If your device  goes off TWICE, call 911. - If your device goes off THREE times in one day, call 911.  DO NOT DRIVE YOURSELF OR A FAMILY MEMBER WITH A DEFIBRILLATOR TO THE HOSPITAL--CALL 911.   Information on my medicine - Coumadin   (Warfarin)  Why was Coumadin prescribed for you? Coumadin was prescribed for you because you have a blood clot or a medical condition that can cause an increased risk of forming blood clots. Blood clots can cause serious health problems by blocking the flow of blood to the heart, lung, or brain. Coumadin can prevent harmful blood clots from forming. As a reminder your indication for Coumadin is:   Select from menuprior LV (left ventricular) mural thrombus (blood clot in a chamber of the heart).  What test will check on my response to Coumadin? While on Coumadin (warfarin) you will need to have an INR test regularly to ensure that your dose is keeping you in the desired range. The INR (international normalized ratio) number is calculated from the result of the laboratory test called prothrombin time (PT).  If an  INR APPOINTMENT HAS NOT ALREADY BEEN MADE FOR YOU please schedule an appointment to have this lab work done by your health care provider within 7 days. Your INR goal is usually a number between:  2 to 3 or your provider may give you a more narrow range like 2-2.5.  Ask your health care provider during an office visit what your goal INR is.  What  do you need to  know  About  COUMADIN? Take Coumadin (warfarin) exactly as prescribed by your healthcare provider about the same time each day.  DO NOT stop taking without talking to the doctor who prescribed the medication.  Stopping without other blood clot prevention medication to take the place of Coumadin may increase your risk of developing a new clot or stroke.  Get refills before you run out.  What do you do if you miss a dose? If you miss a dose, take it as soon as you remember on the same day then continue your  regularly scheduled regimen the next day.  Do not take two doses of Coumadin at the same time.  Important Safety Information A possible side effect of Coumadin (Warfarin) is an increased risk of bleeding. You should call your healthcare provider right away if you experience any of the following: ? Bleeding from an injury or your nose that does not stop. ? Unusual colored urine (red or dark brown) or unusual colored stools (red or black). ? Unusual bruising for unknown reasons. ? A serious fall or if you hit your head (even if there is no bleeding).  Some foods or medicines interact with Coumadin (warfarin) and might alter your response to warfarin. To help avoid this: ? Eat a balanced diet, maintaining a consistent amount of Vitamin K. ? Notify your provider about major diet changes you plan to make. ? Avoid alcohol or limit your intake to 1 drink for women and 2 drinks for men per day. (1 drink is 5 oz. wine, 12 oz. beer, or 1.5 oz. liquor.)  Make sure that ANY health care provider who prescribes medication for you knows that you are taking Coumadin (warfarin).  Also make sure the healthcare provider who is monitoring your Coumadin knows when you have started a new medication including herbals and non-prescription products.  Coumadin (Warfarin)  Major Drug Interactions  Increased Warfarin Effect Decreased Warfarin Effect  Alcohol (large quantities) Antibiotics (esp. Septra/Bactrim, Flagyl, Cipro) Amiodarone (Cordarone) Aspirin (ASA) Cimetidine (Tagamet) Megestrol (Megace) NSAIDs (ibuprofen, naproxen, etc.) Piroxicam (Feldene) Propafenone (Rythmol SR) Propranolol (Inderal) Isoniazid (INH) Posaconazole (Noxafil) Barbiturates (Phenobarbital) Carbamazepine (Tegretol) Chlordiazepoxide (Librium) Cholestyramine (Questran) Griseofulvin Oral Contraceptives Rifampin Sucralfate (Carafate) Vitamin K   Coumadin (Warfarin) Major Herbal Interactions  Increased Warfarin Effect Decreased  Warfarin Effect  Garlic Ginseng Ginkgo biloba Coenzyme Q10 Green tea St. Johns wort    Coumadin (Warfarin) FOOD Interactions  Eat a consistent number of servings per week of foods HIGH in Vitamin K (1 serving =  cup)  Collards (cooked, or boiled & drained) Kale (cooked, or boiled & drained) Mustard greens (cooked, or boiled & drained) Parsley *serving size only =  cup Spinach (cooked, or boiled & drained) Swiss chard (cooked, or boiled & drained) Turnip greens (cooked, or boiled & drained)  Eat a consistent number of servings per week of foods MEDIUM-HIGH in Vitamin K (1 serving = 1 cup)  Asparagus (cooked, or boiled & drained) Broccoli (cooked, boiled & drained, or raw & chopped) Brussel sprouts (cooked, or boiled & drained) *serving size only =  cup Lettuce, raw (green leaf, endive, romaine) Spinach, raw Turnip greens, raw & chopped   These websites have more information on Coumadin (warfarin):  http://www.king-russell.com/; https://www.hines.net/;

## 2017-07-03 NOTE — Discharge Summary (Addendum)
ELECTROPHYSIOLOGY PROCEDURE DISCHARGE SUMMARY    Patient ID: Robert Booth,  MRN: [id number], DOB/AGE: Apr 03, 1970 47 y.o.  Admit date: 07/03/2017 Discharge date: 07/04/2017  Primary Care Physician: Marcine Matar, MD Primary Cardiologist: Dr. Gala Romney Electrophysiologist: Dr. Ladona Ridgel  Primary Discharge Diagnosis:  1. NICM  Secondary Discharge Diagnosis:  1. CKD (II) 2. Chronic CHF (systolic)   Allergies  Allergen Reactions  . Celexa [Citalopram Hydrobromide] Other (See Comments)    DISORIENTED  . Lisinopril Cough     Procedures This Admission:  1.  Implantation of a BSCi single chamber ICD on 07/03/17 by Dr Ladona Ridgel.  The patient received a [employer] (serial number [id number]) right ventricular defibrillator lead, and a [employer] (serial Number [id number]) ICD DFT's were deferred at time of implant.   There were no immediate post procedure complications. 2.  CXR on 07/04/17 demonstrated no pneumothorax status post device implantation.   Brief HPI: Robert Booth is a 47 y.o. male was referred to electrophysiology in the outpatient setting for consideration of ICD implantation.  Past medical history is noted above.  The patient has persistent LV dysfunction despite guideline directed therapy.  Risks, benefits, and alternatives to ICD implantation were reviewed with the patient who wished to proceed.   Hospital Course:  The patient was admitted and underwent implantation of an ICD with details as outlined above. He was monitored on telemetry overnight which demonstrated NSR-SB 54  Left chest was without hematoma or ecchymosis. The device was interrogated and found to be functioning normally.  CXR was obtained and demonstrated no pneumothorax status post device implantation.  Wound care, arm mobility, and restrictions were reviewed with the patient.  The patient is feeling well,  no CP, palpitations or SOB, he was examined by Dr. Ladona Ridgel and considered stable for discharge to home.    The patient's discharge medications include an ARB (Entresto) and beta blocker (carvedilol).   Physical Exam: Vitals:   07/03/17 1427 07/03/17 2022 07/04/17 0457 07/04/17 0913  BP: 133/89 111/69 108/79 118/71  Pulse:  62 62 77  Resp:  18 18   Temp:  98.2 F (36.8 C) 98 F (36.7 C)   TempSrc:  Oral Oral   SpO2:  95% 99%   Weight:   209 lb 12.8 oz (95.2 kg)   Height:        GEN- The patient is well appearing, alert and oriented x 3 today.   HEENT: normocephalic, atraumatic; sclera clear, conjunctiva pink; hearing intact; oropharynx clear Lungs-  CTA b/l, normal work of breathing.  No wheezes, rales, rhonchi Heart- RRR, no murmurs, rubs or gallops, PMI not laterally displaced GI- soft, non-tender, non-distended Extremities- no clubbing, cyanosis, or edema MS- no significant deformity or atrophy Skin- warm and dry, no rash or lesion, left  chest without hematoma/ecchymosis Psych- euthymic mood, full affect Neuro- no gross defecits  Labs:   Lab Results  Component Value Date   WBC 7.0 06/26/2017   HGB 12.8 (L) 06/26/2017   HCT 37.2 (L) 06/26/2017   MCV 91 06/26/2017   PLT 310 06/26/2017   No results for input(s): NA, K, CL, CO2, BUN, CREATININE, CALCIUM, PROT, BILITOT, ALKPHOS, ALT, AST, GLUCOSE in the last 168 hours.  Invalid input(s): LABALBU  Discharge Medications:  Allergies as of 07/04/2017      Reactions   Celexa [citalopram Hydrobromide] Other (See Comments)   DISORIENTED   Lisinopril Cough      Medication List    TAKE these medications  acetaminophen 325 MG tablet Commonly known as:  TYLENOL Take 1-2 tablets (325-650 mg total) by mouth every 4 (four) hours as needed for mild pain.   carvedilol 6.25 MG tablet Commonly known as:  COREG Take 1 tablet (6.25 mg total) by mouth 2 (two) times daily with a meal.   digoxin 0.125 MG tablet Commonly known as:  LANOXIN Take 1 tablet (0.125 mg total) by mouth daily.   folic acid 1 MG tablet Commonly known as:   FOLVITE TAKE 1 TABLET BY MOUTH DAILY   furosemide 40 MG tablet Commonly known as:  LASIX Take 1 tablet (40 mg total) by mouth daily. Take an extra tab on Mondays and Fridays What changed:    how much to take  when to take this  additional instructions   hydrALAZINE 50 MG tablet Commonly known as:  APRESOLINE Take 1.5 tablets (75 mg total) by mouth 3 (three) times daily.   isosorbide mononitrate 30 MG 24 hr tablet Commonly known as:  IMDUR Take 1 tablet (30 mg total) by mouth daily.   multivitamin with minerals Tabs tablet Take 1 tablet by mouth daily.   sacubitril-valsartan 97-103 MG Commonly known as:  ENTRESTO Take 1 tablet by mouth 2 (two) times daily.   spironolactone 25 MG tablet Commonly known as:  ALDACTONE TAKE 1 TABLET BY MOUTH ONCE DAILY What changed:    how much to take  how to take this  when to take this   warfarin 7.5 MG tablet Commonly known as:  COUMADIN Take as directed. If you are unsure how to take this medication, talk to your nurse or doctor. Original instructions:  Please take as directed by coumadin clinic. What changed:    how much to take  how to take this  when to take this  additional instructions       Disposition: Home   Follow-up Information    Marinus Maw, MD Follow up.   Specialty:  Cardiology Why:  You will be called by Dr. Lubertha Basque scheduler to make a 3 month follow up visit Contact information: [address] [place] [address] [phone]        [facility] Follow up on 07/17/2017.   Specialty:  Cardiology Why:  11:00AM, wound check visit Contact information: [address], [address] [place] [address] [phone]       [facility] [address] Follow up on 07/10/2017.   Specialty:  Cardiology Why:  7:45AM, coumadin clinic Contact information: [address] [place] [address] [phone]           Duration of Discharge Encounter: Greater than 30 minutes including physician time.  Jolene Provost PA-C 07/04/2017 10:28 AM  EP Attending  Patient seen and examined. Agree with above. He is doing well, s/p ICD insertion. His device interogation under my supervision demonstrates that device is working normally. Usual followup.   Leonia Reeves.D.

## 2017-07-04 ENCOUNTER — Ambulatory Visit (HOSPITAL_COMMUNITY): Payer: Medicaid Other

## 2017-07-04 DIAGNOSIS — I428 Other cardiomyopathies: Secondary | ICD-10-CM | POA: Diagnosis not present

## 2017-07-04 DIAGNOSIS — Z006 Encounter for examination for normal comparison and control in clinical research program: Secondary | ICD-10-CM | POA: Diagnosis not present

## 2017-07-04 DIAGNOSIS — I429 Cardiomyopathy, unspecified: Secondary | ICD-10-CM | POA: Diagnosis not present

## 2017-07-04 DIAGNOSIS — I130 Hypertensive heart and chronic kidney disease with heart failure and stage 1 through stage 4 chronic kidney disease, or unspecified chronic kidney disease: Secondary | ICD-10-CM | POA: Diagnosis not present

## 2017-07-04 DIAGNOSIS — I5022 Chronic systolic (congestive) heart failure: Secondary | ICD-10-CM | POA: Diagnosis not present

## 2017-07-04 LAB — PROTIME-INR
INR: 1.24
Prothrombin Time: 15.5 seconds — ABNORMAL HIGH (ref 11.4–15.2)

## 2017-07-04 MED ORDER — ACETAMINOPHEN 325 MG PO TABS: 325.0000 mg | ORAL_TABLET | ORAL | Status: DC | PRN

## 2017-07-04 MED FILL — Cefazolin Sodium-Dextrose IV Solution 2 GM/100ML-4%: INTRAVENOUS | Qty: 100 | Status: AC

## 2017-07-04 MED FILL — Heparin Sodium (Porcine) 2 Unit/ML in Sodium Chloride 0.9%: INTRAMUSCULAR | Qty: 1000 | Status: AC

## 2017-07-04 MED FILL — Gentamicin Sulfate Inj 40 MG/ML: INTRAMUSCULAR | Qty: 80 | Status: AC

## 2017-07-04 NOTE — Progress Notes (Signed)
The patient has been given discharge instructions along with a new medication list and what to take today. He has follow up appointments and has been educated on ICD site care along with restrictions of movement, showering, and driving. The patient is discharging with his family member via car.   Sheppard Evens RN

## 2017-07-04 NOTE — Plan of Care (Signed)
  Problem: Clinical Measurements: Goal: Will remain free from infection Outcome: Progressing Note:  No s/s of infection noted. Goal: Respiratory complications will improve Outcome: Progressing Note:  No s/s of respiratory complications.   

## 2017-07-08 ENCOUNTER — Telehealth: Payer: Self-pay | Admitting: Physician Assistant

## 2017-07-08 ENCOUNTER — Observation Stay (HOSPITAL_COMMUNITY)
Admission: EM | Admit: 2017-07-08 | Discharge: 2017-07-09 | Disposition: A | Discharge status: Home / Self Care | Payer: Medicaid Other | Attending: Internal Medicine | Admitting: Internal Medicine

## 2017-07-08 ENCOUNTER — Encounter (HOSPITAL_COMMUNITY): Payer: Self-pay | Admitting: Emergency Medicine

## 2017-07-08 ENCOUNTER — Emergency Department (HOSPITAL_COMMUNITY): Payer: Medicaid Other

## 2017-07-08 DIAGNOSIS — I5022 Chronic systolic (congestive) heart failure: Secondary | ICD-10-CM | POA: Diagnosis not present

## 2017-07-08 DIAGNOSIS — I130 Hypertensive heart and chronic kidney disease with heart failure and stage 1 through stage 4 chronic kidney disease, or unspecified chronic kidney disease: Secondary | ICD-10-CM | POA: Diagnosis not present

## 2017-07-08 DIAGNOSIS — I10 Essential (primary) hypertension: Secondary | ICD-10-CM | POA: Diagnosis not present

## 2017-07-08 DIAGNOSIS — F1011 Alcohol abuse, in remission: Secondary | ICD-10-CM

## 2017-07-08 DIAGNOSIS — I428 Other cardiomyopathies: Secondary | ICD-10-CM | POA: Diagnosis not present

## 2017-07-08 DIAGNOSIS — E162 Hypoglycemia, unspecified: Secondary | ICD-10-CM

## 2017-07-08 DIAGNOSIS — I472 Ventricular tachycardia, unspecified: Secondary | ICD-10-CM

## 2017-07-08 DIAGNOSIS — Z7901 Long term (current) use of anticoagulants: Secondary | ICD-10-CM | POA: Diagnosis not present

## 2017-07-08 DIAGNOSIS — R079 Chest pain, unspecified: Principal | ICD-10-CM | POA: Insufficient documentation

## 2017-07-08 DIAGNOSIS — N182 Chronic kidney disease, stage 2 (mild): Secondary | ICD-10-CM | POA: Insufficient documentation

## 2017-07-08 DIAGNOSIS — Z79899 Other long term (current) drug therapy: Secondary | ICD-10-CM | POA: Diagnosis not present

## 2017-07-08 DIAGNOSIS — Z87898 Personal history of other specified conditions: Secondary | ICD-10-CM | POA: Diagnosis not present

## 2017-07-08 DIAGNOSIS — Z4502 Encounter for adjustment and management of automatic implantable cardiac defibrillator: Secondary | ICD-10-CM | POA: Diagnosis not present

## 2017-07-08 DIAGNOSIS — Z95810 Presence of automatic (implantable) cardiac defibrillator: Secondary | ICD-10-CM | POA: Insufficient documentation

## 2017-07-08 DIAGNOSIS — M109 Gout, unspecified: Secondary | ICD-10-CM | POA: Insufficient documentation

## 2017-07-08 DIAGNOSIS — Z87891 Personal history of nicotine dependence: Secondary | ICD-10-CM | POA: Diagnosis not present

## 2017-07-08 DIAGNOSIS — Z86718 Personal history of other venous thrombosis and embolism: Secondary | ICD-10-CM | POA: Diagnosis not present

## 2017-07-08 LAB — CBC
HEMATOCRIT: 37.7 % — AB (ref 39.0–52.0)
Hemoglobin: 13.3 g/dL (ref 13.0–17.0)
MCH: 31.3 pg (ref 26.0–34.0)
MCHC: 35.3 g/dL (ref 30.0–36.0)
MCV: 88.7 fL (ref 78.0–100.0)
Platelets: 267 10*3/uL (ref 150–400)
RBC: 4.25 MIL/uL (ref 4.22–5.81)
RDW: 12.8 % (ref 11.5–15.5)
WBC: 8.6 10*3/uL (ref 4.0–10.5)

## 2017-07-08 LAB — I-STAT TROPONIN, ED: Troponin i, poc: 0 ng/mL (ref 0.00–0.08)

## 2017-07-08 LAB — BASIC METABOLIC PANEL
Anion gap: 10 (ref 5–15)
BUN: 11 mg/dL (ref 6–20)
CO2: 25 mmol/L (ref 22–32)
Calcium: 9.3 mg/dL (ref 8.9–10.3)
Chloride: 104 mmol/L (ref 101–111)
Creatinine, Ser: 1.07 mg/dL (ref 0.61–1.24)
GFR calc Af Amer: >60 mL/min (ref >60)
GLUCOSE: 59 mg/dL — AB (ref 65–99)
POTASSIUM: 3.7 mmol/L (ref 3.5–5.1)
Sodium: 139 mmol/L (ref 135–145)

## 2017-07-08 LAB — CBG MONITORING, ED: Glucose-Capillary: 102 mg/dL — ABNORMAL HIGH (ref 65–99)

## 2017-07-08 MED ORDER — LORAZEPAM 1 MG PO TABS
1.0000 mg | ORAL_TABLET | Freq: Once | ORAL | Status: AC
  Administered 2017-07-08: 1 mg via ORAL
  Filled 2017-07-08: qty 1

## 2017-07-08 MED FILL — SPIRONOLACTONE 25 MG TABLET: 25 | 34 days supply | Qty: 34 | Fill #1

## 2017-07-08 NOTE — Telephone Encounter (Signed)
   Pt states he had a defib shock today.  He felt his heart start to race or flutter as he was finishing dinner. He got better for a time, but had several more episodes within the next 15 minutes. With the last episode, he was getting light-headed and then felt a shock.   He feels a little nervous now, but does not feel his heart is racing.   He is not having chest pain or SOB.  He checked his BP/HR and they are ok right now. He notes that he has an appt in the CHF clinic tomorrowl.  Advised him to come to the ER at [facility] by EMS, but he feels ok and does not want to come by ambulance. He agrees to come by car.  Theodore Demark, PA-C 07/08/2017 7:41 PM Beeper [phone]

## 2017-07-08 NOTE — ED Triage Notes (Signed)
Patient told to come to ED by his cardiologist office - they reported his ICD fired earlier today. Patient reports feeling lightheaded initially and then his heart began racing. He currently reports 5/10 central CP with intermittent radiation to R arm. Just had ICD placed 5 days ago. Dr. Lewayne Bunting, [facility] [employer] EL ICD VR model [phone]. Patient endorsing SOB at this time, though resp e/u. Skin warm/dry.

## 2017-07-08 NOTE — ED Notes (Signed)
Pt continue to deny any chest pain.  Pt resting quietly, family remain at bedside

## 2017-07-08 NOTE — ED Notes (Signed)
[employer] pacemaker interrogated, directed to call #[phone]-Cardiac. Number called, report is en route to fax machine

## 2017-07-08 NOTE — ED Provider Notes (Signed)
[facility] EMERGENCY DEPARTMENT Provider Note   [name]: [id number] Arrival date & time: 07/08/17  2003     History   Chief Complaint Chief Complaint  Patient presents with  . Chest Pain    HPI  Blood pressure 122/84, pulse 77, temperature 98.2 F (36.8 C), temperature source Oral, resp. rate 10, height 5\' 9"  (1.753 m), weight 94.8 kg (209 lb), SpO2 97 %.  Robert Booth is a 47 y.o. male medical history significant for CHF, had AICD placed 5 days ago (EF 15-20%, NICM) by Dr. Ladona Ridgel, was in his normal state of health and he starts to feel fluttering and fast heart rate around 4 PM today.  He started feeling lightheaded, he sat down and rested for 10 minutes.  He went to get a piece of cake and leaned over very slightly and felt like he was going to pass out.  He went to lay down in his bed and he felt the defibrillator fire.  He is feeling very anxious since that time with no associated chest pain, shortness of breath, chest pain.  Primary Care Physician: Marcine Matar, MD Primary Cardiologist: Dr. Gala Romney Electrophysiologist: Dr. Ladona Ridgel   Past Medical History:  Diagnosis Date  . AICD (automatic cardioverter/defibrillator) present 07/03/2017  . Anxiety   . CHF (congestive heart failure) (HCC)   . Essential hypertension   . History of alcohol abuse    a. Sober since Feb 2016.  . 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 NICM (nonischemic cardiomyopathy) (HCC)    a. 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    Patient Active Problem List   Diagnosis Date Noted  . Long term (current) use of anticoagulants [Z79.01] 03/06/2017  . NICM (nonischemic cardiomyopathy) (HCC) 12/01/2016  . H/O ETOH abuse 12/14/2014  . Chronic systolic heart failure (HCC)  78/29/5621  . Elevated LFTs   . Anticoagulated on Coumadin 11/08/2014  . Visual disturbance 11/08/2014  . History of alcohol abuse   . LV (left ventricular) mural thrombus without MI   . Nonischemic cardiomyopathy (HCC) 08/18/2014  . HTN (hypertension) 08/18/2014    Past Surgical History:  Procedure Laterality Date  . CARDIAC CATHETERIZATION  05/2014   In [place], clean cors  . HERNIA REPAIR Right Inguinal  . ICD IMPLANT N/A 07/03/2017   Procedure: ICD IMPLANT;  Surgeon: Marinus Maw, MD;  Location[facility] INVASIVE CV LAB;  Service: Cardiovascular;  Laterality: N/A;  . KNEE SURGERY Right 1998        Home Medications    Prior to Admission medications   Medication Sig Start Date End Date Taking? Authorizing Provider  acetaminophen (TYLENOL) 325 MG tablet Take 1-2 tablets (325-650 mg total) by mouth every 4 (four) hours as needed for mild pain. 07/04/17  Yes Kilroy, Luke K, PA-C  carvedilol (COREG) 6.25 MG tablet Take 1 tablet (6.25 mg total) by mouth 2 (two) times daily with a meal. 03/06/17  Yes Bensimhon, Bevelyn Buckles, MD  digoxin (LANOXIN) 0.125 MG tablet Take 1 tablet (0.125 mg total) by mouth daily. 12/12/16  Yes Bensimhon, Bevelyn Buckles, MD  folic acid (FOLVITE) 1 MG tablet TAKE 1 TABLET BY MOUTH DAILY Patient taking differently: Take 1 mg by mouth once a day 07/02/17  Yes Bensimhon, Bevelyn Buckles, MD  furosemide (LASIX) 40 MG  tablet Take 1 tablet (40 mg total) by mouth daily. Take an extra tab on Mondays and Fridays Patient taking differently: Take 40-80 mg by mouth See admin instructions. Take 40 mg by mouth once a day on Sun/Tues/Wed/Thurs/Sat and 80 mg on Mon/Fri 04/16/17  Yes Bensimhon, Bevelyn Buckles, MD  hydrALAZINE (APRESOLINE) 50 MG tablet Take 1.5 tablets (75 mg total) by mouth 3 (three) times daily. 03/06/17  Yes Bensimhon, Bevelyn Buckles, MD  isosorbide mononitrate (IMDUR) 30 MG 24 hr tablet Take 1 tablet (30 mg total) by mouth daily. 12/18/16  Yes Anders Simmonds, PA-C  Multiple Vitamin  (MULTIVITAMIN WITH MINERALS) TABS tablet Take 1 tablet by mouth daily. 12/06/16  Yes Albertine Grates, MD  sacubitril-valsartan (ENTRESTO) 97-103 MG Take 1 tablet by mouth 2 (two) times daily. 05/28/17  Yes Clegg, Amy D, NP  spironolactone (ALDACTONE) 25 MG tablet TAKE 1 TABLET BY MOUTH ONCE DAILY Patient taking differently: Take 25 mg by mouth once a day 06/21/16  Yes Bensimhon, Bevelyn Buckles, MD  warfarin (COUMADIN) 7.5 MG tablet Please take as directed by coumadin clinic. Patient taking differently: Take 7.5-11.25 mg by mouth See admin instructions. Take 11.25 mg by mouth at bedtime on Sun and 7.5 mg on Mon/Tues/Wed/Thurs/Fri/Sat 05/28/17  Yes Bensimhon, Bevelyn Buckles, MD    Family History Family History  Problem Relation Age of Onset  . Hypertension Mother   . Hypertension Father   . Heart attack Father 15       died  . Hypertension Sister   . Heart attack Paternal Grandfather 70       died    Social History Social History   Tobacco Use  . Smoking status: Former Smoker    Types: Cigarettes  . Smokeless tobacco: Former Neurosurgeon    Types: Chew  . Tobacco comment: Was a rare smoker  Substance Use Topics  . Alcohol use: No    Alcohol/week: 0.0 oz  . Drug use: No     Allergies   Celexa [citalopram hydrobromide] and Lisinopril   Review of Systems Review of Systems  A complete review of systems was obtained and all systems are negative except as noted in the HPI and PMH.   Physical Exam Updated Vital Signs BP 112/80   Pulse 67   Temp 98.2 F (36.8 C) (Oral)   Resp 15   Ht 5\' 9"  (1.753 m)   Wt 94.8 kg (209 lb)   SpO2 99%   BMI 30.86 kg/m   Physical Exam  Constitutional: He is oriented to person, place, and time. He appears well-developed and well-nourished. No distress.  HENT:  Head: Normocephalic and atraumatic.  Mouth/Throat: Oropharynx is clear and moist.  Eyes: Pupils are equal, round, and reactive to light. Conjunctivae and EOM are normal.  Neck: Normal range of motion.    Cardiovascular: Normal rate, regular rhythm and intact distal pulses.  Steristrips to left chest, ICD palpable underneath, no significant warmth  Pulmonary/Chest: Effort normal and breath sounds normal.  Abdominal: Soft. There is no tenderness.  Musculoskeletal: Normal range of motion.  Neurological: He is alert and oriented to person, place, and time.  Skin: He is not diaphoretic.  Psychiatric: He has a normal mood and affect.  Nursing note and vitals reviewed.    ED Treatments / Results  Labs (all labs ordered are listed, but only abnormal results are displayed) Labs Reviewed  BASIC METABOLIC PANEL - Abnormal; Notable for the following components:      Result Value   Glucose,  Bld 59 (*)    All other components within normal limits  CBC - Abnormal; Notable for the following components:   HCT 37.7 (*)    All other components within normal limits  CBG MONITORING, ED - Abnormal; Notable for the following components:   Glucose-Capillary 102 (*)    All other components within normal limits  I-STAT TROPONIN, ED    EKG EKG Interpretation  Date/Time:  Tuesday July 08 2017 20:11:37 EDT Ventricular Rate:  90 PR Interval:  130 QRS Duration: 102 QT Interval:  352 QTC Calculation: 430 R Axis:   21 Text Interpretation:  Normal sinus rhythm Cannot rule out Inferior infarct , age undetermined Cannot rule out Anterior infarct , age undetermined Abnormal ECG Confirmed by Tilden Fossa [phone]) on 07/08/2017 8:47:27 PM   Radiology Dg Chest 2 View  Result Date: 07/08/2017 CLINICAL DATA:  Chest pain.  Defibrillator fired. EXAM: CHEST - 2 VIEW COMPARISON:  Radiographs 07/04/2017.  Chest CT 10/12/2014 FINDINGS: Single lead left-sided ICD implant with the tip overlying the right ventricle. Normal heart size and mediastinal contours. No pulmonary edema. Mild right lung base scarring. No focal consolidation, pleural effusion or pneumothorax. No acute osseous abnormality. IMPRESSION: 1.  Left-sided defibrillator in place with intact leads. 2. No acute abnormality.  Right lung base scarring. Electronically Signed   By: Rubye Oaks M.D.   On: 07/08/2017 21:22    Procedures Procedures (including critical care time)  Medications Ordered in ED Medications  LORazepam (ATIVAN) tablet 1 mg (1 mg Oral Given 07/08/17 2209)     Initial Impression / Assessment and Plan / ED Course  I have reviewed the triage vital signs and the nursing notes.  Pertinent labs & imaging results that were available during my care of the patient were reviewed by me and considered in my medical decision making (see chart for details).     Vitals:   07/08/17 2200 07/08/17 2230 07/08/17 2315 07/08/17 2345  BP: 126/85 110/89 122/89 112/80  Pulse: 61 65 66 67  Resp: 17 (!) 22 19 15   Temp:      TempSrc:      SpO2: 97% 96% 97% 99%  Weight:      Height:        Medications  LORazepam (ATIVAN) tablet 1 mg (1 mg Oral Given 07/08/17 2209)    Christina Hodsdon is 47 y.o. male presenting with firing of defibrillator this afternoon after feeling of palpitations.  Verbal report from nurse from [employer] is that it was either V. tach or V. fib at a rate of 240.  Patient anxious now but feeling better, normal sinus rhythm, blood work reassuring.  Discussed with cards fellow who states that patient can be admitted to medicine and EP physician can properly interrogate and make recommendations and adjust defibrillator as needed tomorrow morning.  Omitted to [facility] hospitalist    Final Clinical Impressions(s) / ED Diagnoses   Final diagnoses:  Defibrillator discharge    ED Discharge Orders    None       Kristan Brummitt, Mardella Layman 07/09/17 Neale Burly, MD 07/09/17 [phone]

## 2017-07-08 NOTE — ED Notes (Signed)
Pt to xray at this time.

## 2017-07-08 NOTE — ED Notes (Signed)
Pt denies any chest pain at this time.  Skin warm and dry, color appropriate.

## 2017-07-08 NOTE — ED Notes (Signed)
Information regarding ICD received from [employer]. Copy placed in medical records and copy given to Dr. Madilyn Hook.

## 2017-07-09 ENCOUNTER — Encounter (HOSPITAL_COMMUNITY): Payer: Medicaid Other

## 2017-07-09 DIAGNOSIS — Z7901 Long term (current) use of anticoagulants: Secondary | ICD-10-CM | POA: Diagnosis not present

## 2017-07-09 DIAGNOSIS — I472 Ventricular tachycardia, unspecified: Secondary | ICD-10-CM

## 2017-07-09 DIAGNOSIS — E162 Hypoglycemia, unspecified: Secondary | ICD-10-CM | POA: Diagnosis present

## 2017-07-09 DIAGNOSIS — R739 Hyperglycemia, unspecified: Secondary | ICD-10-CM

## 2017-07-09 DIAGNOSIS — Z95810 Presence of automatic (implantable) cardiac defibrillator: Secondary | ICD-10-CM | POA: Diagnosis present

## 2017-07-09 DIAGNOSIS — I5022 Chronic systolic (congestive) heart failure: Secondary | ICD-10-CM | POA: Diagnosis not present

## 2017-07-09 DIAGNOSIS — F1021 Alcohol dependence, in remission: Secondary | ICD-10-CM | POA: Diagnosis not present

## 2017-07-09 DIAGNOSIS — I10 Essential (primary) hypertension: Secondary | ICD-10-CM | POA: Diagnosis not present

## 2017-07-09 DIAGNOSIS — Z4502 Encounter for adjustment and management of automatic implantable cardiac defibrillator: Secondary | ICD-10-CM | POA: Diagnosis not present

## 2017-07-09 LAB — PROTIME-INR
INR: 2.04
PROTHROMBIN TIME: 22.8 s — AB (ref 11.4–15.2)

## 2017-07-09 LAB — TROPONIN I
Troponin I: <0.03 ng/mL (ref <0.03)
Troponin I: <0.03 ng/mL (ref <0.03)

## 2017-07-09 LAB — MAGNESIUM: MAGNESIUM: 2 mg/dL (ref 1.7–2.4)

## 2017-07-09 MED ORDER — CARVEDILOL 12.5 MG PO TABS
12.5000 mg | ORAL_TABLET | Freq: Two times a day (BID) | ORAL | Status: DC
  Administered 2017-07-09: 12.5 mg via ORAL
  Filled 2017-07-09: qty 1

## 2017-07-09 MED ORDER — FOLIC ACID 1 MG PO TABS
1.0000 mg | ORAL_TABLET | Freq: Every day | ORAL | Status: DC
  Administered 2017-07-09: 1 mg via ORAL
  Filled 2017-07-09: qty 1

## 2017-07-09 MED ORDER — DIGOXIN 125 MCG PO TABS
0.1250 mg | ORAL_TABLET | Freq: Every day | ORAL | Status: DC
  Administered 2017-07-09: 0.125 mg via ORAL
  Filled 2017-07-09: qty 1

## 2017-07-09 MED ORDER — WARFARIN SODIUM 10 MG PO TABS: 11.2500 mg | ORAL_TABLET | ORAL | Status: DC

## 2017-07-09 MED ORDER — SPIRONOLACTONE 25 MG PO TABS
25.0000 mg | ORAL_TABLET | Freq: Every day | ORAL | Status: DC
  Administered 2017-07-09: 25 mg via ORAL
  Filled 2017-07-09: qty 1

## 2017-07-09 MED ORDER — WARFARIN - PHYSICIAN DOSING INPATIENT: Freq: Every day | Status: DC

## 2017-07-09 MED ORDER — MEXILETINE HCL 200 MG PO CAPS
200.0000 mg | ORAL_CAPSULE | Freq: Two times a day (BID) | ORAL | Status: DC
  Administered 2017-07-09: 200 mg via ORAL
  Filled 2017-07-09: qty 1

## 2017-07-09 MED ORDER — CARVEDILOL 12.5 MG PO TABS: 12.5000 mg | ORAL_TABLET | Freq: Two times a day (BID) | ORAL | 6 refills | Status: DC

## 2017-07-09 MED ORDER — WARFARIN - PHARMACIST DOSING INPATIENT: Freq: Every day | Status: DC

## 2017-07-09 MED ORDER — MEXILETINE HCL 200 MG PO CAPS: 200.0000 mg | ORAL_CAPSULE | Freq: Two times a day (BID) | ORAL | 6 refills | Status: DC

## 2017-07-09 MED ORDER — HYDRALAZINE HCL 25 MG PO TABS
75.0000 mg | ORAL_TABLET | Freq: Three times a day (TID) | ORAL | Status: DC
  Administered 2017-07-09 (×2): 75 mg via ORAL
  Filled 2017-07-09 (×2): qty 1

## 2017-07-09 MED ORDER — FUROSEMIDE 20 MG PO TABS: 80.0000 mg | ORAL_TABLET | ORAL | Status: DC

## 2017-07-09 MED ORDER — ISOSORBIDE MONONITRATE ER 30 MG PO TB24
30.0000 mg | ORAL_TABLET | Freq: Every day | ORAL | Status: DC
  Administered 2017-07-09: 30 mg via ORAL
  Filled 2017-07-09: qty 1

## 2017-07-09 MED ORDER — FUROSEMIDE 20 MG PO TABS
40.0000 mg | ORAL_TABLET | ORAL | Status: DC
  Administered 2017-07-09: 40 mg via ORAL
  Filled 2017-07-09: qty 1

## 2017-07-09 MED ORDER — WARFARIN SODIUM 7.5 MG PO TABS
7.5000 mg | ORAL_TABLET | ORAL | Status: DC
  Filled 2017-07-09: qty 1

## 2017-07-09 MED ORDER — CARVEDILOL 6.25 MG PO TABS
6.2500 mg | ORAL_TABLET | Freq: Two times a day (BID) | ORAL | Status: DC
  Administered 2017-07-09: 6.25 mg via ORAL
  Filled 2017-07-09: qty 1

## 2017-07-09 MED ORDER — SACUBITRIL-VALSARTAN 97-103 MG PO TABS
1.0000 | ORAL_TABLET | Freq: Two times a day (BID) | ORAL | Status: DC
  Administered 2017-07-09: 1 via ORAL
  Filled 2017-07-09 (×2): qty 1

## 2017-07-09 MED FILL — CARVEDILOL 12.5 MG TABLET: 12.5 | 30 days supply | Qty: 60 | Fill #0

## 2017-07-09 NOTE — ED Notes (Signed)
Called lab to draw blood.

## 2017-07-09 NOTE — ED Notes (Signed)
Lab at bedside.   Pt reports that he would like to be discharged. He states "the Dr.'s have not come around here to talk to me yet, so I am ready to go".   This RN will contact attending physicians.

## 2017-07-09 NOTE — Progress Notes (Signed)
ANTICOAGULATION CONSULT NOTE - Initial Consult  Pharmacy Consult for warfarin Indication: mural thrombus  Allergies  Allergen Reactions  . Celexa [Citalopram Hydrobromide] Other (See Comments)    Makes the patient disoriented  . Lisinopril Cough    Patient Measurements: Height: 5\' 9"  (175.3 cm) Weight: 209 lb (94.8 kg) IBW/kg (Calculated) : 70.7  Vital Signs: Temp: 97.9 F (36.6 C) (04/10 0443) Temp Source: Oral (04/10 0443) BP: 143/79 (04/10 1400) Pulse Rate: 73 (04/10 1400)  Labs: Recent Labs    07/08/17 2021 07/09/17 0601 07/09/17 1237  HGB 13.3  --   --   HCT 37.7*  --   --   PLT 267  --   --   LABPROT  --  22.8*  --   INR  --  2.04  --   CREATININE 1.07  --   --   TROPONINI  --  <0.03 <0.03    Estimated Creatinine Clearance: 96.9 mL/min (by C-G formula based on SCr of 1.07 mg/dL).    Assessment: 68 YOM with hx mural thrombus on warfarin- home dose 7.5mg  daily except 11.25mg  on Sundays. INR 2.04. Hgb and plts nml- no bleeding noted.  Goal of Therapy:  INR 2-3 Monitor platelets by anticoagulation protocol: Yes   Plan:  Continue home warfarin Daily INR Follow s/s bleeding  Chrisa Hassan D. Sapir Lavey, PharmD, BCPS Clinical Pharmacist Clinical Phone for 07/09/2017 until 3:30pm: [phone] If after 3:30pm, please call main pharmacy at [phone] 07/09/2017 3:36 PM

## 2017-07-09 NOTE — Progress Notes (Signed)
Patient ID: Robert Booth, male   DOB: 22-Nov-1970, 47 y.o.   MRN: [id number]                                                                PROGRESS NOTE                                                                                                                                                                                                             Patient Demographics:    Robert Booth, is a 47 y.o. male, DOB - 02/18/1971, WUJ:[id number]  Admit date - 07/08/2017   Admitting Physician Clydie Braun, MD  Outpatient Primary MD for the patient is Marcine Matar, MD  LOS - 0  Outpatient Specialists:     Chief Complaint  Patient presents with  . Chest Pain       Brief Narrative    47 y.o. male with medical history significant of  NICM, sCHF last EF15-20%, s/p AICD placement by Sharrell Ku on 4/5,  HTN, and alcohol abuse(in remission); who presented with complaints of feeling his heart racing.  Symptoms started while he was eating lunch at approximately 4 PM.  Patient had initially went outside to get some air and then sat down and rested for 10 minutes.  After getting back up patient reported feeling as though he may pass out and went to lay down on the bed.  Shortly thereafter patient reports his defibrillator shocked him.  Thereafter, he reported feeling anxious, short of breath, and complained of mild chest discomfort with radiation into his right arm.  He reports having similar episode like this previously in the past as the reason why he was placed on LifeVest.  Denies any recent alcohol use, tobacco, or illicit drug use.  He denies having any significant fever, chills, nausea, vomiting, leg swelling, orthopnea, or signs of infection or ICD was placed.  He has a follow-up appointment with Dr. Gala Romney with the heart failure clinic tomorrow.   ED Course: Upon admission Into the emergency department patient does not have signs relatively within normal limits.  Labs revealed  glucose 59, troponin 0, and all other labs relatively within normal limits.  Cardiology was consulted and recommended observation overnight with the patient to be seen by the cardiologist in a.m.  [facility]  called to admit.  At this time patient denies shortness of breath or chest pain symptoms.      Subjective:    Hermenegildo Clausen today has been doing fine. He states "I was supposed to see Dr. Jesusita Oka this am".  No further shock.    No headache, No chest pain, No abdominal pain - No Nausea, No new weakness tingling or numbness, No Cough - SOB.    Assessment  & Plan :    Principal Problem:   Ventricular tachycardia (HCC) Active Problems:   HTN (hypertension)   History of alcohol abuse   Long term (current) use of anticoagulants [Z79.01]   AICD (automatic cardioverter/defibrillator) present   Hypoglycemia   Ventricular tachycardia, defibrillator discharge, s/p AICD : Acute.  Patient recently had AICD placed on 4/5 by Dr. Sharrell Ku. He presents after having palpitations palpitations found to be in ventricular tachycardia with heart rate of 249 with AICD firing.  Cardiology consulted and recommended observation  EP cardiologist to evaluate, appreciate input  Systolic congestive heart failure: Stable.  Patient does not appear to be acutely fluid overloaded at this time. - Continue Entresto, spironolactone, Coreg, digoxin, furosemide, and hydralazine   On chronic anticoagulation: Patient on Coumadin. - Check PT/INR - Continue Coumadin per home regimen  Essential hypertension - Continue medications as seen above  Hypoglycemia: Acute.  Initial glucose on repeat 59 on admission, patient was given snacks in the ED with improvement blood glucose. - Continue to monitor  H/O alcohol abuse: Patient  in remission.  DVT prophylaxis: Coumadin Code Status: Full Family Communication: Discussed plan of care with the patient  Disposition Plan: Likely discharge home once medically stable per  cardiology Consults called: Cardiology Admission status: Observation         Lab Results  Component Value Date   PLT 267 07/08/2017    Antibiotics  :  none  Anti-infectives (From admission, onward)   None        Objective:   Vitals:   07/09/17 0445 07/09/17 0515 07/09/17 0545 07/09/17 0615  BP: (!) 144/96 131/80 128/84 118/81  Pulse: (!) 59 64 63 62  Resp: 15 14 11 12   Temp:      TempSrc:      SpO2: 97% 97% 96% 97%  Weight:      Height:        Wt Readings from Last 3 Encounters:  07/08/17 94.8 kg (209 lb)  07/04/17 95.2 kg (209 lb 12.8 oz)  06/18/17 92.4 kg (203 lb 12.8 oz)    No intake or output data in the 24 hours ending 07/09/17 0721   Physical Exam  Awake Alert, Oriented X 3, No new F.N deficits, Normal affect [place].AT,PERRAL Supple Neck,No JVD, No cervical lymphadenopathy appriciated.  Symmetrical Chest wall movement, Good air movement bilaterally, CTAB RRR,No Gallops,Rubs or new Murmurs, No Parasternal Heave +ve B.Sounds, Abd Soft, No tenderness, No organomegaly appriciated, No rebound - guarding or rigidity. No Cyanosis, Clubbing or edema, No new Rash or bruise     Data Review:    CBC Recent Labs  Lab 07/08/17 2021  WBC 8.6  HGB 13.3  HCT 37.7*  PLT 267  MCV 88.7  MCH 31.3  MCHC 35.3  RDW 12.8    Chemistries  Recent Labs  Lab 07/08/17 2021 07/09/17 0601  NA 139  --   K 3.7  --   CL 104  --   CO2 25  --   GLUCOSE 59*  --   BUN  11  --   CREATININE 1.07  --   CALCIUM 9.3  --   MG  --  2.0   ------------------------------------------------------------------------------------------------------------------ No results for input(s): CHOL, HDL, LDLCALC, TRIG, CHOLHDL, LDLDIRECT in the last 72 hours.  Lab Results  Component Value Date   HGBA1C 4.9 07/21/2012   ------------------------------------------------------------------------------------------------------------------ No results for input(s): TSH, T4TOTAL, T3FREE,  THYROIDAB in the last 72 hours.  Invalid input(s): FREET3 ------------------------------------------------------------------------------------------------------------------ No results for input(s): VITAMINB12, FOLATE, FERRITIN, TIBC, IRON, RETICCTPCT in the last 72 hours.  Coagulation profile Recent Labs  Lab 07/03/17 0543 07/04/17 0334 07/09/17 0601  INR 1.36 1.24 2.04    No results for input(s): DDIMER in the last 72 hours.  Cardiac Enzymes Recent Labs  Lab 07/09/17 0601  TROPONINI <0.03   ------------------------------------------------------------------------------------------------------------------    Component Value Date/Time   BNP 89.1 05/28/2017 0917    Inpatient Medications  Scheduled Meds: . carvedilol  6.25 mg Oral BID WC  . digoxin  0.125 mg Oral Daily  . folic acid  1 mg Oral Daily  . furosemide  40 mg Oral Once per day on Sun Tue Wed Thu Sat  . [START ON 07/11/2017] furosemide  80 mg Oral Once per day on Mon Fri  . hydrALAZINE  75 mg Oral TID  . isosorbide mononitrate  30 mg Oral Daily  . sacubitril-valsartan  1 tablet Oral BID  . spironolactone  25 mg Oral Daily  . warfarin  7.5-11.25 mg Oral See admin instructions  . Warfarin - Physician Dosing Inpatient   Does not apply q1800   Continuous Infusions: PRN Meds:.  Micro Results Recent Results (from the past 240 hour(s))  Surgical PCR screen     Status: None   Collection Time: 07/03/17  6:00 AM  Result Value Ref Range Status   MRSA, PCR NEGATIVE NEGATIVE Final   Staphylococcus aureus NEGATIVE NEGATIVE Final    Comment: (NOTE) The Xpert SA Assay (FDA approved for NASAL specimens in patients 79 years of age and older), is one component of a comprehensive surveillance program. It is not intended to diagnose infection nor to guide or monitor treatment. Performed at [facility], [address] [place], [place] [address]     Radiology Reports Dg Chest 2 View  Result Date:  07/08/2017 CLINICAL DATA:  Chest pain.  Defibrillator fired. EXAM: CHEST - 2 VIEW COMPARISON:  Radiographs 07/04/2017.  Chest CT 10/12/2014 FINDINGS: Single lead left-sided ICD implant with the tip overlying the right ventricle. Normal heart size and mediastinal contours. No pulmonary edema. Mild right lung base scarring. No focal consolidation, pleural effusion or pneumothorax. No acute osseous abnormality. IMPRESSION: 1. Left-sided defibrillator in place with intact leads. 2. No acute abnormality.  Right lung base scarring. Electronically Signed   By: Rubye Oaks M.D.   On: 07/08/2017 21:22   Dg Chest 2 View  Result Date: 07/04/2017 CLINICAL DATA:  Left defibrillator insertion EXAM: CHEST - 2 VIEW COMPARISON:  Nineteen 2018 FINDINGS: Left subclavian single lead defibrillator noted. Mild cardiomegaly with low lung volumes and basilar atelectasis. No effusion or pneumothorax. Upper lobes remain clear. Trachea is midline. Degenerative changes of the spine. Normal bowel gas pattern. IMPRESSION: Left subclavian defibrillator. Negative for pneumothorax Low volumes with minor basilar atelectasis Electronically Signed   By: Judie Petit.  Shick M.D.   On: 07/04/2017 09:39    Time Spent in minutes  30   Pearson Grippe M.D on 07/09/2017 at 7:21 AM  Between 7am to 7pm - Pager - [phone]  After 7pm go to [email or web address] - password [facility]  [facility] -  Office  [phone]

## 2017-07-09 NOTE — ED Notes (Signed)
Cardiology at the bedside.

## 2017-07-09 NOTE — Discharge Instructions (Signed)
NO DRIVING FOR 6 MONTHS °

## 2017-07-09 NOTE — ED Notes (Signed)
Dr. Selena Batten called. He is on the way.

## 2017-07-09 NOTE — Discharge Summary (Signed)
Robert Booth, is a 47 y.o. male  DOB November 02, 1970  MRN [id number].  Admission date:  07/08/2017  Admitting Physician  Clydie Braun, MD  Discharge Date:  07/09/2017   Primary MD  Marcine Matar, MD  Recommendations for primary care physician for things to follow:    Ventricular tachycardia,defibrillator discharge,s/p AICD :Acute. Patient recently had AICD placed on 4/5 by Dr. Sharrell Ku. Hepresents after having palpitations palpitations found to be in ventricular tachycardia with heart rate of 249 with AICD firing.  Start Mexilitine 200mg  po bid Increase Carvedilol to 12.5mg  po bid F/u with cardiology 4/18 as scheduled  Systolic congestive heart failure:Stable.  Pt will check daily weight at home  On chronic anticoagulation: Continue Coumadin per home regimen  Essential hypertension Continue home medications w exception of increase in carvedilol  Hypoglycemia:Acute. Initial glucose on repeat 59 on admission, patient was given snacks in the ED with improvement blood glucose. Continue to monitor at home  H/O alcohol abuse: Patient in remission.     Admission Diagnosis  chest pain    Discharge Diagnosis  chest pain        Principal Problem:   Ventricular tachycardia (HCC) Active Problems:   HTN (hypertension)   History of alcohol abuse   Long term (current) use of anticoagulants [Z79.01]   AICD (automatic cardioverter/defibrillator) present   Hypoglycemia      Past Medical History:  Diagnosis Date  . AICD (automatic cardioverter/defibrillator) present 07/03/2017  . Anxiety   . CHF (congestive heart failure) (HCC)   . Essential hypertension   . History of alcohol abuse    a. Sober since Feb 2016.  . 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 NICM (nonischemic cardiomyopathy) (HCC)    a. 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    Past Surgical History:  Procedure Laterality Date  . CARDIAC CATHETERIZATION  05/2014   In [place], clean cors  . HERNIA REPAIR Right Inguinal  . ICD IMPLANT N/A 07/03/2017   Procedure: ICD IMPLANT;  Surgeon: Marinus Maw, MD;  Location[facility] INVASIVE CV LAB;  Service: Cardiovascular;  Laterality: N/A;  . KNEE SURGERY Right 1998       HPI  from the history and physical done on the day of admission:     46 y.o.malewith medical history significant ofNICM, sCHF last EF15-20%,s/p AICD placementby Sharrell Ku on 4/5,HTN, andalcohol abuse(in remission);who presented with complaints of feeling his heart racing. Symptoms started while he was eating lunch at approximately 4 PM. Patient had initially went outside to get some air and then sat down and rested for 10 minutes. After getting back up patient reported feeling as though he may pass out and went to lay down on the bed. Shortly thereafter patient reports his defibrillator shocked him. Thereafter,he reported feeling anxious,short of  breath, and complained of mild chest discomfort with radiation into his right arm.He reports having similar episode like this previously in the past as the reason why he was placed on LifeVest. Denies any recent alcohol use, tobacco, or illicit drug use. He denies having any significant fever, chills, nausea, vomiting, leg swelling, orthopnea, or signs of infection or ICD was placed. He has a follow-up appointment with Dr. Francella Solian the heart failure clinic tomorrow.   ED Course:Upon admission Into the emergency department patient does not have signs relatively within normal limits. Labs revealed glucose 59, troponin 0, and all other labs relatively within normal limits. Cardiology was consulted and  recommended observation overnight with the patient to be seen by the cardiologist in a.m. [facility] called to admit.At this time patient denies shortness of breath or chest pain symptoms.      Hospital Course:      Pt was evaluated by cardiology.  Pt was placed on mexilitine 200mg  po bid.  Pt also had carvedilol increased to 12.5mg  po bid.  Pt has not had any further shock by defibrillator.  Pt was seen by his primary cardiologist who has arranged follow up.     Follow UP  Follow-up Information    [facility] Follow up on 07/17/2017.   Specialty:  Cardiology Why:  11:00AM, wound check visit Contact information: [address], [address] [place] [address] [phone]       Marinus Maw, MD Follow up on 10/15/2017.   Specialty:  Cardiology Why:  8:30AM Contact information: [address] [place] [address] [phone]        [facility]. Go on 07/17/2017.   Specialty:  Cardiology Why:  3:00 PM [facility], parking code [address] Contact information: [address] [id number] [address]c [place] [address] [phone]           Consults obtained - cardiology  Discharge Condition: stable  Diet and Activity recommendation: See Discharge Instructions below  Discharge Instructions         Discharge Medications     Allergies as of 07/09/2017      Reactions   Celexa [citalopram Hydrobromide] Other (See Comments)   Makes the patient disoriented   Lisinopril Cough      Medication List    TAKE these medications   acetaminophen 325 MG tablet Commonly known as:  TYLENOL Take 1-2 tablets (325-650 mg total) by mouth every 4 (four) hours as needed for mild pain.   carvedilol 12.5 MG tablet Commonly known as:  COREG Take 1 tablet (12.5 mg total) by mouth 2 (two) times daily with a meal. What changed:    medication  strength  how much to take   digoxin 0.125 MG tablet Commonly known as:  LANOXIN Take 1 tablet (0.125 mg total) by mouth daily.   folic acid 1 MG tablet Commonly known as:  FOLVITE TAKE 1 TABLET BY MOUTH DAILY What changed:    how much to take  how to take this  when to take this   furosemide 40 MG tablet Commonly known as:  LASIX Take 1 tablet (40 mg total) by mouth daily. Take an extra tab on Mondays and Fridays What changed:    how much to take  when to take this  additional instructions   hydrALAZINE 50 MG tablet Commonly known as:  APRESOLINE Take 1.5 tablets (75 mg total) by mouth 3 (three)  times daily.   isosorbide mononitrate 30 MG 24 hr tablet Commonly known as:  IMDUR Take 1 tablet (30 mg total) by mouth daily.   mexiletine 200 MG capsule Commonly known as:  MEXITIL Take 1 capsule (200 mg total) by mouth every 12 (twelve) hours.   multivitamin with minerals Tabs tablet Take 1 tablet by mouth daily.   sacubitril-valsartan 97-103 MG Commonly known as:  ENTRESTO Take 1 tablet by mouth 2 (two) times daily.   spironolactone 25 MG tablet Commonly known as:  ALDACTONE TAKE 1 TABLET BY MOUTH ONCE DAILY What changed:    how much to take  how to take this  when to take this   warfarin 7.5 MG tablet Commonly known as:  COUMADIN Take as directed. If you are unsure how to take this medication, talk to your nurse or doctor. Original instructions:  Please take as directed by coumadin clinic. What changed:    how much to take  how to take this  when to take this  additional instructions       Major procedures and Radiology Reports - PLEASE review detailed and final reports for all details, in brief -      Dg Chest 2 View  Result Date: 07/08/2017 CLINICAL DATA:  Chest pain.  Defibrillator fired. EXAM: CHEST - 2 VIEW COMPARISON:  Radiographs 07/04/2017.  Chest CT 10/12/2014 FINDINGS: Single lead left-sided ICD implant with the tip overlying  the right ventricle. Normal heart size and mediastinal contours. No pulmonary edema. Mild right lung base scarring. No focal consolidation, pleural effusion or pneumothorax. No acute osseous abnormality. IMPRESSION: 1. Left-sided defibrillator in place with intact leads. 2. No acute abnormality.  Right lung base scarring. Electronically Signed   By: Rubye Oaks M.D.   On: 07/08/2017 21:22   Dg Chest 2 View  Result Date: 07/04/2017 CLINICAL DATA:  Left defibrillator insertion EXAM: CHEST - 2 VIEW COMPARISON:  Nineteen 2018 FINDINGS: Left subclavian single lead defibrillator noted. Mild cardiomegaly with low lung volumes and basilar atelectasis. No effusion or pneumothorax. Upper lobes remain clear. Trachea is midline. Degenerative changes of the spine. Normal bowel gas pattern. IMPRESSION: Left subclavian defibrillator. Negative for pneumothorax Low volumes with minor basilar atelectasis Electronically Signed   By: Judie Petit.  Shick M.D.   On: 07/04/2017 09:39    Micro Results      Recent Results (from the past 240 hour(s))  Surgical PCR screen     Status: None   Collection Time: 07/03/17  6:00 AM  Result Value Ref Range Status   MRSA, PCR NEGATIVE NEGATIVE Final   Staphylococcus aureus NEGATIVE NEGATIVE Final    Comment: (NOTE) The Xpert SA Assay (FDA approved for NASAL specimens in patients 44 years of age and older), is one component of a comprehensive surveillance program. It is not intended to diagnose infection nor to guide or monitor treatment. Performed at [facility], [address], [place], [place] [address]        Today   Subjective    Obi Scrima today has no headache,no chest pain, no abdominal pain,no new weakness tingling or numbness, feels much better wants to go home today.   Objective   Blood pressure 129/88, pulse 76, temperature 98.7 F (37.1 C), resp. rate 17, height 5\' 9"  (1.753 m), weight 94.8 kg (209 lb), SpO2 98 %.  No intake or output data in  the 24 hours ending 07/09/17 1807  Exam Awake Alert, Oriented x 3, No new F.N deficits,  Normal affect [place].AT,PERRAL Supple Neck,No JVD, No cervical lymphadenopathy appriciated.  Symmetrical Chest wall movement, Good air movement bilaterally, CTAB RRR,No Gallops,Rubs or new Murmurs, No Parasternal Heave +ve B.Sounds, Abd Soft, Non tender, No organomegaly appriciated, No rebound -guarding or rigidity. No Cyanosis, Clubbing or edema, No new Rash or bruise   Data Review   CBC w Diff:  Lab Results  Component Value Date   WBC 8.6 07/08/2017   HGB 13.3 07/08/2017   HGB 12.8 (L) 06/26/2017   HCT 37.7 (L) 07/08/2017   HCT 37.2 (L) 06/26/2017   PLT 267 07/08/2017   PLT 310 06/26/2017   LYMPHOPCT 12 11/09/2014   MONOPCT 7 11/09/2014   EOSPCT 0 11/09/2014   BASOPCT 0 11/09/2014    CMP:  Lab Results  Component Value Date   NA 139 07/08/2017   NA 140 06/26/2017   K 3.7 07/08/2017   CL 104 07/08/2017   CO2 25 07/08/2017   BUN 11 07/08/2017   BUN 15 06/26/2017   CREATININE 1.07 07/08/2017   CREATININE 1.39 (H) 11/08/2014   PROT 6.6 12/03/2016   ALBUMIN 3.2 (L) 12/03/2016   BILITOT 1.5 (H) 12/03/2016   ALKPHOS 73 12/03/2016   AST 53 (H) 12/03/2016   ALT 50 12/03/2016  .   Total Time in preparing paper work, data evaluation and todays exam - 35 minutes  Pearson Grippe M.D on 07/09/2017 at 6:07 PM  [facility]   Office  [phone]

## 2017-07-09 NOTE — Consult Note (Addendum)
Cardiology Consultation:   Patient ID: Robert Booth; [id number]; 02-13-1971   Admit date: 07/08/2017 Date of Consult: 07/09/2017  Primary Care Provider: Marcine Matar, MD Primary Cardiologist: Dr. Gala Booth Primary Electrophysiologist:  Dr. Ladona Booth   Patient Profile:   Robert Booth is a 47 y.o. male with a hx of NICM, notes report Cath ok performed Robert Booth 2016, chronic CHF (systolic) suspect 2/2 to prolonged ETOH use/abuse, CRI (II), hx of LV thrombus on warfarin, wore life vest for months 2/2 near syncope suspect to be NSVT/VT treated for his NICM with persistent low EF underwent ICD implant 07/03/17 who is being seen today for the evaluation of ICD shock at the request of Dr. Katrinka Booth .  History of Present Illness:   [name] states that he has been feeling well, no CP or SOB, good exertional capacity, no symptoms of PND or orthopnea, until yesterday, no dizziness, near syncope or syncope.  He has not been ill, and has not had any new medicines, reports compliance with all his medicines without missed doses.  Yesterday he was feeling well, had just finished eating when he felt a fluttering in his chest, mentions for a bout 2 minutes felt brief flutters, this though seemed to last he got up and took out the recycle stuff to get some fresh air, the fluttering seemed to last felt a little winded. Went inside felt slightly better, was in the kitchen to get some cake and when he bent over felt suddenly near syncopal, stood up and tried to collect himself, went to lay down and as he laid on the bed was shocked.  He had no LOC.  LABS K+ 3.7 Mag 2.0 BUN/Creat 11/1.07 Trop I: < 0.03 x2 WBC 8.6 H/H 13/37 Plts 267 INR 2.04  ICD information: BSci single chamber ICD Implanted 07/03/17, Dr. Ladona Booth NICM  Interrogation ([place]) Battery and lead measurements are good 07/08/17 VT episode with VT w/avg V rate 243bpm, 2 ATP failed followed by successful 41J shock  0% VP  Past Medical  History:  Diagnosis Date  . AICD (automatic cardioverter/defibrillator) present 07/03/2017  . Anxiety   . CHF (congestive heart failure) (HCC)   . Essential hypertension   . History of alcohol abuse    a. Sober since Feb 2016.  . 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 NICM (nonischemic cardiomyopathy) (HCC)    a. 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    Past Surgical History:  Procedure Laterality Date  . CARDIAC CATHETERIZATION  05/2014   In [place], clean cors  . HERNIA REPAIR Right Inguinal  . ICD IMPLANT N/A 07/03/2017   Procedure: ICD IMPLANT;  Surgeon: Marinus Maw, MD;  Location: [facility] INVASIVE CV LAB;  Service: Cardiovascular;  Laterality: N/A;  . KNEE SURGERY Right 1998      Inpatient Medications: Scheduled Meds: . carvedilol  6.25 mg Oral BID WC  . digoxin  0.125 mg Oral Daily  . folic acid  1 mg Oral Daily  . furosemide  40 mg Oral Once per day on Sun Tue Wed Thu Sat  . [START ON 07/11/2017] furosemide  80 mg Oral Once per day on Mon Fri  . hydrALAZINE  75 mg Oral TID  . isosorbide mononitrate  30 mg Oral Daily  . sacubitril-valsartan  1 tablet Oral BID  .  spironolactone  25 mg Oral Daily  . [START ON 07/13/2017] warfarin  11.25 mg Oral Q Sun-1800  . warfarin  7.5 mg Oral Once per day on Mon Tue Wed Thu Fri Sat  . Warfarin - Physician Dosing Inpatient   Does not apply q1800   Continuous Infusions:  PRN Meds:   Allergies:    Allergies  Allergen Reactions  . Celexa [Citalopram Hydrobromide] Other (See Comments)    Makes the patient disoriented  . Lisinopril Cough    Social History:   Social History   Socioeconomic History  . Marital status: Single    Spouse name: Not on file  . Number of children: 6  . Years of education: Not on file  .  Highest education level: Not on file  Occupational History  . Occupation: Unemployed  Social Needs  . Financial resource strain: Not on file  . Food insecurity:    Worry: Not on file    Inability: Not on file  . Transportation needs:    Medical: Not on file    Non-medical: Not on file  Tobacco Use  . Smoking status: Former Smoker    Types: Cigarettes  . Smokeless tobacco: Former Neurosurgeon    Types: Chew  . Tobacco comment: Was a rare smoker  Substance and Sexual Activity  . Alcohol use: No    Alcohol/week: 0.0 oz  . Drug use: No  . Sexual activity: Yes  Lifestyle  . Physical activity:    Days per week: Not on file    Minutes per session: Not on file  . Stress: Not on file  Relationships  . Social connections:    Talks on phone: Not on file    Gets together: Not on file    Attends religious service: Not on file    Active member of club or organization: Not on file    Attends meetings of clubs or organizations: Not on file    Relationship status: Not on file  . Intimate partner violence:    Fear of current or ex partner: Not on file    Emotionally abused: Not on file    Physically abused: Not on file    Forced sexual activity: Not on file  Other Topics Concern  . Not on file  Social History Narrative   Lives with mom.      Family History:   Family History  Problem Relation Age of Onset  . Hypertension Mother   . Hypertension Father   . Heart attack Father 57       died  . Hypertension Sister   . Heart attack Paternal Grandfather 50       died     ROS:  Please see the history of present illness.  All other ROS reviewed and negative.     Physical Exam/Data:   Vitals:   07/09/17 1100 07/09/17 1230 07/09/17 1315 07/09/17 1400  BP: 120/80 (!) 127/92 126/78 (!) 143/79  Pulse: 74 75 73 73  Resp: 18 18 15 20   Temp:      TempSrc:      SpO2: 97% 97% 98% 98%  Weight:      Height:       No intake or output data in the 24 hours ending 07/09/17 1443 Filed Weights    07/08/17 2012  Weight: 209 lb (94.8 kg)   Body mass index is 30.86 kg/m.  General:  Well nourished, well developed, in no acute distress HEENT: normal Lymph: no adenopathy Neck:  no JVD Endocrine:  No thryomegaly Vascular: No carotid bruits  Cardiac:  RRR; no murmurs, gallops or rubs Lungs:  CTA b/l, no wheezing, rhonchi or rales  Abd: soft, nontender  Ext: no edema Musculoskeletal:  No deformities Skin: warm and dry  Neuro:  No gross focal abnormalities noted Psych:  Normal affect  ICD implant site: steri strips remain in place, dry, no hematoma   EKG:  The EKG was personally reviewed and demonstrates:   SR 90bpm, no acute changes Telemetry:  Telemetry was personally reviewed and demonstrates:   SR infrequent PVC, rare couplet  Relevant CV Studies:  Echo 10/13/14 EF 20% Echo 12/16 EF 35 - 40%No LV clot Echo 11/30/15 EF 35%   Echo  11/2016 EF 10-15%.  ECHO 04/2017 EF ~20%. RV moderately dilated   CMRI 05/20/2017  1. Mild to moderately dilated LV with EF 19%. Diffuse hypokinesis with septal-lateral dyssynchrony. There is a small thinned area of the basal inferoseptal wall, there does not appear to be an actual VSD. No LV thrombus noted. 2. Normal RV size with mildly decreased systolic function. 3. Noncoronary LGE pattern as noted above. This could be suggestive of prior myocarditis, less likely cardiac sarcoidosis.    Laboratory Data:  Chemistry Recent Labs  Lab 07/08/17 2021  NA 139  K 3.7  CL 104  CO2 25  GLUCOSE 59*  BUN 11  CREATININE 1.07  CALCIUM 9.3  GFRNONAA >60  GFRAA >60  ANIONGAP 10    No results for input(s): PROT, ALBUMIN, AST, ALT, ALKPHOS, BILITOT in the last 168 hours. Hematology Recent Labs  Lab 07/08/17 2021  WBC 8.6  RBC 4.25  HGB 13.3  HCT 37.7*  MCV 88.7  MCH 31.3  MCHC 35.3  RDW 12.8  PLT 267   Cardiac Enzymes Recent Labs  Lab 07/09/17 0601 07/09/17 1237  TROPONINI <0.03 <0.03    Recent Labs  Lab  07/08/17 2031  TROPIPOC 0.00    BNPNo results for input(s): BNP, PROBNP in the last 168 hours.  DDimer No results for input(s): DDIMER in the last 168 hours.  Radiology/Studies:   Dg Chest 2 View Result Date: 07/08/2017 CLINICAL DATA:  Chest pain.  Defibrillator fired. EXAM: CHEST - 2 VIEW COMPARISON:  Radiographs 07/04/2017.  Chest CT 10/12/2014 FINDINGS: Single lead left-sided ICD implant with the tip overlying the right ventricle. Normal heart size and mediastinal contours. No pulmonary edema. Mild right lung base scarring. No focal consolidation, pleural effusion or pneumothorax. No acute osseous abnormality. IMPRESSION: 1. Left-sided defibrillator in place with intact leads. 2. No acute abnormality.  Right lung base scarring. Electronically Signed   By: Rubye Oaks M.D.   On: 07/08/2017 21:22    Assessment and Plan:   1. VT     Appropriate ICD therapy     Pt reports compliance with meds, no particular symptoms of late     unremarkable labs  BP and HR here look like he could tolerate more Coreg, though EKGs from implant were SB 50's Robert Booth increase his coreg, keep his same wound check visit 07/17/17 Patient reports he does not currently drive, without a care.  He is made aware of [place] law no driving for 6 months s/p ICD therapies and the rational. He states understanding.  Dr. Elberta Fortis has seen and examined patient, Anna Beaird also add mexiletine OK to discharge from our stabdpoint    For questions or updates, please contact [facility] Please consult [email or web address] for contact info under Cardiology/STEMI.  Signed, Sheilah Pigeon, PA-C  07/09/2017 2:43 PM   I have seen and examined this patient with Francis Dowse.  Agree with above, note added to reflect my findings.  On exam, RRR, no murmurs, lungs clear.  She admitted to the hospital after ICD shock.  Had a [employer] ICD implanted 1 week ago.  Patient is currently asymptomatic, feeling well.  At this point, would be  okay for discharge with mexiletine 200 mg twice a day.  Have discussed with him no driving per [facility] for 6 months.  Gayl Ivanoff M. Lazer Wollard MD 07/09/2017 6:27 PM

## 2017-07-09 NOTE — ED Notes (Signed)
IV d/c waiting on orders.

## 2017-07-09 NOTE — Consult Note (Addendum)
Advanced Heart Failure Team Consult Note   Primary Physician: Marcine Matar, MD PCP-Cardiologist:  No primary care provider on file.  Reason for Consultation: ICD shock  HPI:    Robert Booth is seen today for evaluation of ICD shock at the request of Dr. Selena Batten.   Robert Booth is a 47 y.o. male with systolic CHF, NICM, s/p [employer] Sci ICD, ETOH abuse, and CKD stage 2.   Last seen in CHF clinic 05/28/17. Was having more "bad" than good days. Was SOB with mild exertion. Wearing Lifevest. CPX test ordered and performed 06/04/17. No significant HF limitations shown.   Pt underwent admission 07/03/17 for planned implant of Boston Sci ICD. Course uncomplicated.   Yesterday, 07/08/17 pt was sitting at the table after eating two hot dogs and potato salad, with juice.  He started to feel palpitations after eating that went away at rest. He had two episodes.  He got up and walked to a different room when he had further palpitations, and then felt his ICD shock him.  He called [facility] and , Hgb was told to proceed to [facility]. Pertinent labs on admission show K 3.7, Mg 2.0, Cr 1.0, Hgb 13.3, WBC 8.6. ICD interrogation shows ATP x 2 and ICD shock x 1. CXR showed stable ICD with in-tact leads and no acute abnormalities.  He is feeling better currently. No recurrent lightheadedness or dizziness. No more palpitations or shocks.  He has been taking all of his medicines as directed. He was feeling his USOH up until yesterday afternoon. He was actually feeling better after his ICD placement.   Echo 04/2017 LVEF 15-20%, Grade 2 DD, Trivial AI, Mild LAE, Mod RV dilation and moderate reduction, PA peak pressure 47 mm Hg.   CPX 06/04/2017 Pre-Exercise PFTs  FVC 3.99 (96%)    FEV1 3.26 (98%)     FEV1/FVC 82 (102%)     MVV 156 (101%)    Resting HR: 82 Peak HR: 155  (90% age predicted max HR) BP rest: 94/68 BP peak: 138/76 Peak VO2: 24.5 (78% predicted peak VO2) VE/VCO2 slope: 29 OUES: 2.49 Peak RER:  1.07 Ventilatory Threshold: 16.9 (54% predicted and 69% measured peak VO2) Peak RR 43 Peak Ventilation: 75.7 VE/MVV: 49% PETCO2 at peak: 36 O2pulse: 16  (94% predicted O2pulse)  Review of Systems: [y] = yes, [ ]  = no   General: Weight gain [ ] ; Weight loss [ ] ; Anorexia [ ] ; Fatigue [ ] ; Fever [ ] ; Chills [ ] ; Weakness [ ]   Cardiac: Chest pain/pressure [ ] ; Resting SOB [ ] ; Exertional SOB [y]; Orthopnea [ ] ; Pedal Edema [ ] ; Palpitations [y]; Syncope [ ] ; Presyncope [y]; Paroxysmal nocturnal dyspnea[ ]   Pulmonary: Cough [ ] ; Wheezing[ ] ; Hemoptysis[ ] ; Sputum [ ] ; Snoring [ ]   GI: Vomiting[ ] ; Dysphagia[ ] ; Melena[ ] ; Hematochezia [ ] ; Heartburn[ ] ; Abdominal pain [ ] ; Constipation [ ] ; Diarrhea [ ] ; BRBPR [ ]   GU: Hematuria[ ] ; Dysuria [ ] ; Nocturia[ ]   Vascular: Pain in legs with walking [ ] ; Pain in feet with lying flat [ ] ; Non-healing sores [ ] ; Stroke [ ] ; TIA [ ] ; Slurred speech [ ] ;  Neuro: Headaches[ ] ; Vertigo[ ] ; Seizures[ ] ; Paresthesias[ ] ;Blurred vision [ ] ; Diplopia [ ] ; Vision changes [ ]   Ortho/Skin: Arthritis [ ] ; Joint pain [ ] ; Muscle pain [ ] ; Joint swelling [ ] ; Back Pain [ ] ; Rash [ ]   Psych: Depression[ ] ; Anxiety[ ]   Heme: Bleeding problems [ ] ;  Clotting disorders [ ] ; Anemia [ ]   Endocrine: Diabetes [ ] ; Thyroid dysfunction[ ]   Home Medications Prior to Admission medications   Medication Sig Start Date End Date Taking? Authorizing Provider  acetaminophen (TYLENOL) 325 MG tablet Take 1-2 tablets (325-650 mg total) by mouth every 4 (four) hours as needed for mild pain. 07/04/17  Yes Kilroy, Luke K, PA-C  carvedilol (COREG) 6.25 MG tablet Take 1 tablet (6.25 mg total) by mouth 2 (two) times daily with a meal. 03/06/17  Yes Diamante Truszkowski, Bevelyn Buckles, MD  digoxin (LANOXIN) 0.125 MG tablet Take 1 tablet (0.125 mg total) by mouth daily. 12/12/16  Yes Atisha Hamidi, Bevelyn Buckles, MD  folic acid (FOLVITE) 1 MG tablet TAKE 1 TABLET BY MOUTH DAILY Patient taking differently: Take 1 mg  by mouth once a day 07/02/17  Yes Yoona Ishii, Bevelyn Buckles, MD  furosemide (LASIX) 40 MG tablet Take 1 tablet (40 mg total) by mouth daily. Take an extra tab on Mondays and Fridays Patient taking differently: Take 40-80 mg by mouth See admin instructions. Take 40 mg by mouth once a day on Sun/Tues/Wed/Thurs/Sat and 80 mg on Mon/Fri 04/16/17  Yes Rohen Kimes, Bevelyn Buckles, MD  hydrALAZINE (APRESOLINE) 50 MG tablet Take 1.5 tablets (75 mg total) by mouth 3 (three) times daily. 03/06/17  Yes Alazia Crocket, Bevelyn Buckles, MD  isosorbide mononitrate (IMDUR) 30 MG 24 hr tablet Take 1 tablet (30 mg total) by mouth daily. 12/18/16  Yes Anders Simmonds, PA-C  Multiple Vitamin (MULTIVITAMIN WITH MINERALS) TABS tablet Take 1 tablet by mouth daily. 12/06/16  Yes Albertine Grates, MD  sacubitril-valsartan (ENTRESTO) 97-103 MG Take 1 tablet by mouth 2 (two) times daily. 05/28/17  Yes Clegg, Amy D, NP  spironolactone (ALDACTONE) 25 MG tablet TAKE 1 TABLET BY MOUTH ONCE DAILY Patient taking differently: Take 25 mg by mouth once a day 06/21/16  Yes Neriyah Cercone, Bevelyn Buckles, MD  warfarin (COUMADIN) 7.5 MG tablet Please take as directed by coumadin clinic. Patient taking differently: Take 7.5-11.25 mg by mouth See admin instructions. Take 11.25 mg by mouth at bedtime on Sun and 7.5 mg on Mon/Tues/Wed/Thurs/Fri/Sat 05/28/17  Yes Jere Bostrom, Bevelyn Buckles, MD    Past Medical History: Past Medical History:  Diagnosis Date  . AICD (automatic cardioverter/defibrillator) present 07/03/2017  . Anxiety   . CHF (congestive heart failure) (HCC)   . Essential hypertension   . History of alcohol abuse    a. Sober since Feb 2016.  . 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 NICM (nonischemic cardiomyopathy) (HCC)    a. 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      Past Surgical History: Past Surgical History:  Procedure Laterality Date  . CARDIAC CATHETERIZATION  05/2014   In [place], clean cors  . HERNIA REPAIR Right Inguinal  . ICD IMPLANT N/A 07/03/2017   Procedure: ICD IMPLANT;  Surgeon: Marinus Maw, MD;  Location: [facility] INVASIVE CV LAB;  Service: Cardiovascular;  Laterality: N/A;  . KNEE SURGERY Right 1998    Family History: Family History  Problem Relation Age of Onset  . Hypertension Mother   . Hypertension Father   . Heart attack Father 63       died  . Hypertension Sister   . Heart attack Paternal Grandfather 50       died  Social History: Social History   Socioeconomic History  . Marital status: Single    Spouse name: Not on file  . Number of children: 6  . Years of education: Not on file  . Highest education level: Not on file  Occupational History  . Occupation: Unemployed  Social Needs  . Financial resource strain: Not on file  . Food insecurity:    Worry: Not on file    Inability: Not on file  . Transportation needs:    Medical: Not on file    Non-medical: Not on file  Tobacco Use  . Smoking status: Former Smoker    Types: Cigarettes  . Smokeless tobacco: Former Neurosurgeon    Types: Chew  . Tobacco comment: Was a rare smoker  Substance and Sexual Activity  . Alcohol use: No    Alcohol/week: 0.0 oz  . Drug use: No  . Sexual activity: Yes  Lifestyle  . Physical activity:    Days per week: Not on file    Minutes per session: Not on file  . Stress: Not on file  Relationships  . Social connections:    Talks on phone: Not on file    Gets together: Not on file    Attends religious service: Not on file    Active member of club or organization: Not on file    Attends meetings of clubs or organizations: Not on file    Relationship status: Not on file  Other Topics Concern  . Not on file  Social History Narrative   Lives with mom.      Allergies:  Allergies  Allergen Reactions  . Celexa  [Citalopram Hydrobromide] Other (See Comments)    Makes the patient disoriented  . Lisinopril Cough    Objective:    Vital Signs:   Temp:  [97.9 F (36.6 C)-98.2 F (36.8 C)] 97.9 F (36.6 C) (04/10 0443) Pulse Rate:  [57-98] 73 (04/10 1400) Resp:  [9-30] 20 (04/10 1400) BP: (110-144)/(68-100) 143/79 (04/10 1400) SpO2:  [93 %-99 %] 98 % (04/10 1400) Weight:  [209 lb (94.8 kg)] 209 lb (94.8 kg) (04/09 2012)    Weight change: Filed Weights   07/08/17 2012  Weight: 209 lb (94.8 kg)    Intake/Output:  No intake or output data in the 24 hours ending 07/09/17 1505    Physical Exam    General:  Well appearing. No resp difficulty HEENT: normal anicteric  Neck: supple. JVP 6-7 cm. Carotids 2+ bilat; no bruits. No lymphadenopathy or thyromegaly appreciated. Cor: PMI laterally displaced. Regular rate & rhythm. No rubs, gallops or murmurs. Lungs: clear no wheeze  Abdomen: soft, nontender, nondistended. No hepatosplenomegaly. No bruits or masses. Good bowel sounds. Extremities: no cyanosis, clubbing, rash, edema. Warm  Neuro: alert & oriented x 3, cranial nerves grossly intact. moves all 4 extremities w/o difficulty. Affect pleasant   Telemetry   NSR, personally reviewed  EKG    NSR 90 bpm, personally reviewed.   Labs   Basic Metabolic Panel: Recent Labs  Lab 07/08/17 2021 07/09/17 0601  NA 139  --   K 3.7  --   CL 104  --   CO2 25  --   GLUCOSE 59*  --   BUN 11  --   CREATININE 1.07  --   CALCIUM 9.3  --   MG  --  2.0    Liver Function Tests: No results for input(s): AST, ALT, ALKPHOS, BILITOT, PROT, ALBUMIN in the last 168 hours. No results  for input(s): LIPASE, AMYLASE in the last 168 hours. No results for input(s): AMMONIA in the last 168 hours.  CBC: Recent Labs  Lab 07/08/17 2021  WBC 8.6  HGB 13.3  HCT 37.7*  MCV 88.7  PLT 267    Cardiac Enzymes: Recent Labs  Lab 07/09/17 0601 07/09/17 1237  TROPONINI <0.03 <0.03    BNP: BNP (last  3 results) Recent Labs    12/26/16 0954 03/06/17 0942 05/28/17 0917  BNP 395.4* 51.7 89.1    ProBNP (last 3 results) No results for input(s): PROBNP in the last 8760 hours.   CBG: Recent Labs  Lab 07/08/17 2345  GLUCAP 102*    Coagulation Studies: Recent Labs    07/09/17 0601  LABPROT 22.8*  INR 2.04     Imaging   Dg Chest 2 View  Result Date: 07/08/2017 CLINICAL DATA:  Chest pain.  Defibrillator fired. EXAM: CHEST - 2 VIEW COMPARISON:  Radiographs 07/04/2017.  Chest CT 10/12/2014 FINDINGS: Single lead left-sided ICD implant with the tip overlying the right ventricle. Normal heart size and mediastinal contours. No pulmonary edema. Mild right lung base scarring. No focal consolidation, pleural effusion or pneumothorax. No acute osseous abnormality. IMPRESSION: 1. Left-sided defibrillator in place with intact leads. 2. No acute abnormality.  Right lung base scarring. Electronically Signed   By: Rubye Oaks M.D.   On: 07/08/2017 21:22      Medications:     Current Medications: . carvedilol  6.25 mg Oral BID WC  . digoxin  0.125 mg Oral Daily  . folic acid  1 mg Oral Daily  . furosemide  40 mg Oral Once per day on Sun Tue Wed Thu Sat  . [START ON 07/11/2017] furosemide  80 mg Oral Once per day on Mon Fri  . hydrALAZINE  75 mg Oral TID  . isosorbide mononitrate  30 mg Oral Daily  . sacubitril-valsartan  1 tablet Oral BID  . spironolactone  25 mg Oral Daily  . [START ON 07/13/2017] warfarin  11.25 mg Oral Q Sun-1800  . warfarin  7.5 mg Oral Once per day on Mon Tue Wed Thu Fri Sat  . Warfarin - Physician Dosing Inpatient   Does not apply q1800     Infusions:     Patient Profile   Robert Booth is a 47 y.o. male with systolic CHF, NICM, s/p [employer] Sci ICD, ETOH abuse, and CKD stage 2.   Admitted 07/08/17 with ICD shock. 2 ATPs and 1 shock for VT.   Assessment/Plan   1. VT with ICD shock - ATP x 2 and shock x 1. Appropriate per EP.  - Will not be able to  drive until 60/4/54. - Electrolytes stable. No recent illness. ICD just placed on 07/03/17. - EP to see.   2. Chronic systolic HF - NICM Cath ok performed [place] 2016. 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 Echo 10/2016 EF 10-15%. LV dysfunction after stopping HF meds. - ECHO 04/16/2017 EF 15-20%.   - CMRI EF 19%. RV mildly decreased .  - NYHA II- III symptoms - Volume status looks OK on exam.  - Now s/p [employer]i ICD placement.  - Continue Coreg 6.25 mg BID. Up titration has been limited by fatigue. - Continue digoxin 0.125 mg daily. Check level in am.  - Continue hydralazine 75 mg TID + Imdur 30 mg daily - Continue Entresto 97-103 mg BID - Continue Spiro 25 mg daily.  - CPX 05/2017 with no HF limitation as above.   3. LV mural  thrombus  - Continue coumadin for AC.  - INR stable on admit. Dosing per pharm.   4. H/o ETOH abuse quit 05/25/2014 was drinking 1/5 liquor daily.  - No longer drinking.   5. Gout - has been stable.   6. HTN - Meds as above.   Medication concerns reviewed with patient and pharmacy team. Barriers identified: None at this time.   Length of Stay: 0  Luane School  07/09/2017, 3:05 PM  Advanced Heart Failure Team Pager [phone] (M-F; 7a - 4p)  Please contact [facility] for night-coverage after hours (4p -7a ) and weekends on [email or web address]  Patient seen and examined with the above-signed Advanced Practice Provider and/or Housestaff. I personally reviewed laboratory data, imaging studies and relevant notes. I independently examined the patient and formulated the important aspects of the plan. I have edited the note to reflect any of my changes or salient points. I have personally discussed the plan with the patient and/or family.  Patient with severe NICM EF 19% by recent cMRI. ICD placed last week. ICD interrogated personally. Presents with 3 episodes of VT - 2 treated with ATP and 1 treated with ICD shock. Denies any worsening HF symptoms. K 3.7  mg 2.0. Looks well compensated on exam.   We discussed with EP. Will start mexilitene to try and prevent recurrent VT. Supp electrolytes. Volume status ok. On good HF meds.  Can go home from cardiac standpoint with close f/u in HF clinic.   Arvilla Meres, MD  5:33 PM

## 2017-07-09 NOTE — ED Notes (Signed)
Dr. Smith in to assess pt at this time 

## 2017-07-09 NOTE — ED Notes (Signed)
Dr. Jones Broom, at the bedside. Pt is okay to be discharged. This RN has sent a message to Dr. Selena Batten.

## 2017-07-09 NOTE — H&P (Addendum)
History and Physical    Robert Booth ZOX:[id number] DOB: July 12, 1970 DOA: 07/08/2017  Referring MD/NP/PA:Nicole Piscoatta PCP: Marcine Matar, MD  Patient coming from: Home   Chief Complaint: Heart racing  I have personally briefly reviewed patient's old medical records in [facility]   HPI: Robert Booth is a 47 y.o. male with medical history significant of  NICM, sCHF last EF15-20%, s/p AICD placement by Sharrell Ku on 4/5,  HTN, and alcohol abuse(in remission); who presented with complaints of feeling his heart racing.  Symptoms started while he was eating lunch at approximately 4 PM.  Patient had initially went outside to get some air and then sat down and rested for 10 minutes.  After getting back up patient reported feeling as though he may pass out and went to lay down on the bed.  Shortly thereafter patient reports his defibrillator shocked him.  Thereafter, he reported feeling anxious, short of breath, and complained of mild chest discomfort with radiation into his right arm.  He reports having similar episode like this previously in the past as the reason why he was placed on LifeVest.  Denies any recent alcohol use, tobacco, or illicit drug use.  He denies having any significant fever, chills, nausea, vomiting, leg swelling, orthopnea, or signs of infection or ICD was placed.  He has a follow-up appointment with Dr. Gala Romney with the heart failure clinic tomorrow.   ED Course: Upon admission Into the emergency department patient does not have signs relatively within normal limits.  Labs revealed glucose 59, troponin 0, and all other labs relatively within normal limits.  Cardiology was consulted and recommended observation overnight with the patient to be seen by the cardiologist in a.m.  TR[facility] called to admit.  At this time patient denies shortness of breath or chest pain symptoms.  Review of Systems  Constitutional: Negative for chills and fever.  HENT: Negative for  congestion and ear discharge.   Eyes: Negative for blurred vision and photophobia.  Respiratory: Positive for shortness of breath. Negative for cough.   Cardiovascular: Positive for chest pain and palpitations. Negative for leg swelling.  Gastrointestinal: Negative for abdominal pain, nausea and vomiting.  Genitourinary: Negative for dysuria and frequency.  Musculoskeletal: Negative for back pain and myalgias.  Skin: Negative for itching and rash.  Neurological: Positive for dizziness. Negative for sensory change, loss of consciousness and weakness.  Psychiatric/Behavioral: Negative for memory loss and substance abuse.    Past Medical History:  Diagnosis Date  . AICD (automatic cardioverter/defibrillator) present 07/03/2017  . Anxiety   . CHF (congestive heart failure) (HCC)   . Essential hypertension   . History of alcohol abuse    a. Sober since Feb 2016.  . 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 NICM (nonischemic cardiomyopathy) (HCC)    a. 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    Past Surgical History:  Procedure Laterality Date  . CARDIAC CATHETERIZATION  05/2014   In [place], clean cors  . HERNIA REPAIR Right Inguinal  . ICD IMPLANT N/A 07/03/2017   Procedure: ICD IMPLANT;  Surgeon: Marinus Maw, MD;  Location: [facility] INVASIVE CV LAB;  Service: Cardiovascular;  Laterality: N/A;  . KNEE SURGERY Right 1998     reports that he has quit smoking. His smoking use included cigarettes.  He has quit using smokeless tobacco. His smokeless tobacco use included chew. He reports that he does not drink alcohol or use drugs.  Allergies  Allergen Reactions  . Celexa [Citalopram Hydrobromide] Other (See Comments)    Makes the patient disoriented  . Lisinopril Cough     Family History  Problem Relation Age of Onset  . Hypertension Mother   . Hypertension Father   . Heart attack Father 60       died  . Hypertension Sister   . Heart attack Paternal Grandfather 50       died    Prior to Admission medications   Medication Sig Start Date End Date Taking? Authorizing Provider  acetaminophen (TYLENOL) 325 MG tablet Take 1-2 tablets (325-650 mg total) by mouth every 4 (four) hours as needed for mild pain. 07/04/17  Yes Kilroy, Luke K, PA-C  carvedilol (COREG) 6.25 MG tablet Take 1 tablet (6.25 mg total) by mouth 2 (two) times daily with a meal. 03/06/17  Yes Bensimhon, Bevelyn Buckles, MD  digoxin (LANOXIN) 0.125 MG tablet Take 1 tablet (0.125 mg total) by mouth daily. 12/12/16  Yes Bensimhon, Bevelyn Buckles, MD  folic acid (FOLVITE) 1 MG tablet TAKE 1 TABLET BY MOUTH DAILY Patient taking differently: Take 1 mg by mouth once a day 07/02/17  Yes Bensimhon, Bevelyn Buckles, MD  furosemide (LASIX) 40 MG tablet Take 1 tablet (40 mg total) by mouth daily. Take an extra tab on Mondays and Fridays Patient taking differently: Take 40-80 mg by mouth See admin instructions. Take 40 mg by mouth once a day on Sun/Tues/Wed/Thurs/Sat and 80 mg on Mon/Fri 04/16/17  Yes Bensimhon, Bevelyn Buckles, MD  hydrALAZINE (APRESOLINE) 50 MG tablet Take 1.5 tablets (75 mg total) by mouth 3 (three) times daily. 03/06/17  Yes Bensimhon, Bevelyn Buckles, MD  isosorbide mononitrate (IMDUR) 30 MG 24 hr tablet Take 1 tablet (30 mg total) by mouth daily. 12/18/16  Yes Anders Simmonds, PA-C  Multiple Vitamin (MULTIVITAMIN WITH MINERALS) TABS tablet Take 1 tablet by mouth daily. 12/06/16  Yes Albertine Grates, MD  sacubitril-valsartan (ENTRESTO) 97-103 MG Take 1 tablet by mouth 2 (two) times daily. 05/28/17  Yes Clegg, Amy D, NP  spironolactone (ALDACTONE) 25 MG tablet TAKE 1 TABLET BY MOUTH ONCE DAILY Patient taking differently: Take 25 mg by mouth once a day 06/21/16  Yes Bensimhon, Bevelyn Buckles, MD  warfarin (COUMADIN) 7.5 MG tablet Please take  as directed by coumadin clinic. Patient taking differently: Take 7.5-11.25 mg by mouth See admin instructions. Take 11.25 mg by mouth at bedtime on Sun and 7.5 mg on Mon/Tues/Wed/Thurs/Fri/Sat 05/28/17  Yes Bensimhon, Bevelyn Buckles, MD    Physical Exam:  Constitutional: NAD, calm, comfortable Vitals:   07/08/17 2200 07/08/17 2230 07/08/17 2315 07/08/17 2345  BP: 126/85 110/89 122/89 112/80  Pulse: 61 65 66 67  Resp: 17 (!) 22 19 15   Temp:      TempSrc:      SpO2: 97% 96% 97% 99%  Weight:      Height:       Eyes: PERRL, lids and conjunctivae normal ENMT: Mucous membranes are moist. Posterior pharynx clear of any exudate or lesions. Normal dentition.  Neck: normal, supple, no masses, no thyromegaly Respiratory: clear to auscultation bilaterally, no wheezing, no crackles. Normal respiratory effort. No accessory muscle use.  Cardiovascular: Regular rate and rhythm, no murmurs / rubs / gallops. No extremity edema. 2+ pedal pulses. No carotid bruits.  Surgical site of the left chest wall  without signs of erythema. Abdomen: no tenderness, no masses palpated. No hepatosplenomegaly. Bowel sounds positive.  Musculoskeletal: no clubbing / cyanosis. No joint deformity upper and lower extremities. Good ROM, no contractures. Normal muscle tone.  Skin: no rashes, lesions, ulcers. No induration Neurologic: CN 2-12 grossly intact. Sensation intact, DTR normal. Strength 5/5 in all 4.  Psychiatric: Normal judgment and insight. Alert and oriented x 3. Normal mood.     Labs on Admission: I have personally reviewed following labs and imaging studies  CBC: Recent Labs  Lab 07/08/17 2021  WBC 8.6  HGB 13.3  HCT 37.7*  MCV 88.7  PLT 267   Basic Metabolic Panel: Recent Labs  Lab 07/08/17 2021  NA 139  K 3.7  CL 104  CO2 25  GLUCOSE 59*  BUN 11  CREATININE 1.07  CALCIUM 9.3   GFR: Estimated Creatinine Clearance: 96.9 mL/min (by C-G formula based on SCr of 1.07 mg/dL). Liver Function  Tests: No results for input(s): AST, ALT, ALKPHOS, BILITOT, PROT, ALBUMIN in the last 168 hours. No results for input(s): LIPASE, AMYLASE in the last 168 hours. No results for input(s): AMMONIA in the last 168 hours. Coagulation Profile: Recent Labs  Lab 07/03/17 0543 07/04/17 0334  INR 1.36 1.24   Cardiac Enzymes: No results for input(s): CKTOTAL, CKMB, CKMBINDEX, TROPONINI in the last 168 hours. BNP (last 3 results) No results for input(s): PROBNP in the last 8760 hours. HbA1C: No results for input(s): HGBA1C in the last 72 hours. CBG: Recent Labs  Lab 07/08/17 2345  GLUCAP 102*   Lipid Profile: No results for input(s): CHOL, HDL, LDLCALC, TRIG, CHOLHDL, LDLDIRECT in the last 72 hours. Thyroid Function Tests: No results for input(s): TSH, T4TOTAL, FREET4, T3FREE, THYROIDAB in the last 72 hours. Anemia Panel: No results for input(s): VITAMINB12, FOLATE, FERRITIN, TIBC, IRON, RETICCTPCT in the last 72 hours. Urine analysis:    Component Value Date/Time   COLORURINE YELLOW 12/02/2016 1636   APPEARANCEUR CLEAR 12/02/2016 1636   LABSPEC 1.017 12/02/2016 1636   PHURINE 5.0 12/02/2016 1636   GLUCOSEU NEGATIVE 12/02/2016 1636   HGBUR NEGATIVE 12/02/2016 1636   BILIRUBINUR NEGATIVE 12/02/2016 1636   KETONESUR NEGATIVE 12/02/2016 1636   PROTEINUR 100 (A) 12/02/2016 1636   UROBILINOGEN 0.2 11/11/2014 1302   NITRITE NEGATIVE 12/02/2016 1636   LEUKOCYTESUR NEGATIVE 12/02/2016 1636   Sepsis Labs: Recent Results (from the past 240 hour(s))  Surgical PCR screen     Status: None   Collection Time: 07/03/17  6:00 AM  Result Value Ref Range Status   MRSA, PCR NEGATIVE NEGATIVE Final   Staphylococcus aureus NEGATIVE NEGATIVE Final    Comment: (NOTE) The Xpert SA Assay (FDA approved for NASAL specimens in patients 28 years of age and older), is one component of a comprehensive surveillance program. It is not intended to diagnose infection nor to guide or monitor  treatment. Performed at [facility], [address], [place], [place] [address]      Radiological Exams on Admission: Dg Chest 2 View  Result Date: 07/08/2017 CLINICAL DATA:  Chest pain.  Defibrillator fired. EXAM: CHEST - 2 VIEW COMPARISON:  Radiographs 07/04/2017.  Chest CT 10/12/2014 FINDINGS: Single lead left-sided ICD implant with the tip overlying the right ventricle. Normal heart size and mediastinal contours. No pulmonary edema. Mild right lung base scarring. No focal consolidation, pleural effusion or pneumothorax. No acute osseous abnormality. IMPRESSION: 1. Left-sided defibrillator in place with intact leads. 2. No acute abnormality.  Right lung base  scarring. Electronically Signed   By: Rubye Oaks M.D.   On: 07/08/2017 21:22    EKG: Independently reviewed. Normal sinus at 90 bmp   Assessment/Plan Ventricular tachycardia, defibrillator discharge, s/p AICD : Acute.  Patient recently had AICD placed on 4/5 by Dr. Sharrell Ku. He presents after having palpitations palpitations found to be in ventricular tachycardia with heart rate of 249 with AICD firing.  Cardiology consulted and recommended observation overnight with EP cardiologist evaluating in a.m. - Admit to telemetry bed - Trend cardiac troponins - Check magnesium - EP cardiologist to evaluate in a.m.  Systolic congestive heart failure: Stable.  Patient does not appear to be acutely fluid overloaded at this time. - Continue Entresto, spironolactone, Coreg, digoxin, furosemide, and hydralazine   On chronic anticoagulation: Patient on Coumadin. - Check PT/INR - Continue Coumadin per home regimen  Essential hypertension - Continue medications as seen above  Hypoglycemia: Acute.  Initial glucose on repeat 59 on admission, patient was given snacks in the ED with improvement blood glucose. - Continue to monitor  H/O alcohol abuse: Patient  in remission.  DVT prophylaxis: Coumadin Code Status: Full Family  Communication: Discussed plan of care with the patient and family present at bedside Disposition Plan: Likely discharge home once medically stable Consults called: Cardiology Admission status: Observation  Clydie Braun MD [facility] Pager [phone]   If 7PM-7AM, please contact night-coverage [email or web address] Password [email or web address]  07/09/2017, 12:06 AM

## 2017-07-10 ENCOUNTER — Ambulatory Visit (INDEPENDENT_AMBULATORY_CARE_PROVIDER_SITE_OTHER): Payer: Medicaid Other | Admitting: *Deleted

## 2017-07-10 DIAGNOSIS — I513 Intracardiac thrombosis, not elsewhere classified: Secondary | ICD-10-CM

## 2017-07-10 DIAGNOSIS — I428 Other cardiomyopathies: Secondary | ICD-10-CM | POA: Diagnosis not present

## 2017-07-10 DIAGNOSIS — Z5181 Encounter for therapeutic drug level monitoring: Secondary | ICD-10-CM | POA: Diagnosis not present

## 2017-07-10 DIAGNOSIS — I240 Acute coronary thrombosis not resulting in myocardial infarction: Secondary | ICD-10-CM

## 2017-07-10 DIAGNOSIS — Z7901 Long term (current) use of anticoagulants: Secondary | ICD-10-CM

## 2017-07-10 LAB — POCT INR: INR: 2.1

## 2017-07-10 NOTE — Patient Instructions (Signed)
Description   Continue taking same dosage 1 tablet everyday except 1 and 1/2 tablets on Sundays. Recheck  INR in 1 week.

## 2017-07-17 ENCOUNTER — Ambulatory Visit (INDEPENDENT_AMBULATORY_CARE_PROVIDER_SITE_OTHER): Payer: Medicaid Other | Admitting: *Deleted

## 2017-07-17 ENCOUNTER — Ambulatory Visit (HOSPITAL_COMMUNITY)
Admission: RE | Admit: 2017-07-17 | Discharge: 2017-07-17 | Disposition: A | Discharge status: Home / Self Care | Payer: Medicaid Other | Source: Ambulatory Visit | Attending: Internal Medicine | Admitting: Internal Medicine

## 2017-07-17 ENCOUNTER — Encounter (HOSPITAL_COMMUNITY): Payer: Self-pay

## 2017-07-17 VITALS — BP 120/88 | HR 70 | Wt 209.6 lb

## 2017-07-17 DIAGNOSIS — I472 Ventricular tachycardia, unspecified: Secondary | ICD-10-CM

## 2017-07-17 DIAGNOSIS — I240 Acute coronary thrombosis not resulting in myocardial infarction: Secondary | ICD-10-CM

## 2017-07-17 DIAGNOSIS — F419 Anxiety disorder, unspecified: Secondary | ICD-10-CM | POA: Diagnosis not present

## 2017-07-17 DIAGNOSIS — N182 Chronic kidney disease, stage 2 (mild): Secondary | ICD-10-CM | POA: Diagnosis not present

## 2017-07-17 DIAGNOSIS — Z87891 Personal history of nicotine dependence: Secondary | ICD-10-CM | POA: Insufficient documentation

## 2017-07-17 DIAGNOSIS — Z95810 Presence of automatic (implantable) cardiac defibrillator: Secondary | ICD-10-CM | POA: Diagnosis not present

## 2017-07-17 DIAGNOSIS — I5022 Chronic systolic (congestive) heart failure: Secondary | ICD-10-CM | POA: Diagnosis not present

## 2017-07-17 DIAGNOSIS — I513 Intracardiac thrombosis, not elsewhere classified: Secondary | ICD-10-CM

## 2017-07-17 DIAGNOSIS — Z79899 Other long term (current) drug therapy: Secondary | ICD-10-CM | POA: Diagnosis not present

## 2017-07-17 DIAGNOSIS — Z7901 Long term (current) use of anticoagulants: Secondary | ICD-10-CM | POA: Diagnosis not present

## 2017-07-17 DIAGNOSIS — I10 Essential (primary) hypertension: Secondary | ICD-10-CM

## 2017-07-17 DIAGNOSIS — I130 Hypertensive heart and chronic kidney disease with heart failure and stage 1 through stage 4 chronic kidney disease, or unspecified chronic kidney disease: Secondary | ICD-10-CM | POA: Diagnosis not present

## 2017-07-17 DIAGNOSIS — I428 Other cardiomyopathies: Secondary | ICD-10-CM

## 2017-07-17 DIAGNOSIS — Z5181 Encounter for therapeutic drug level monitoring: Secondary | ICD-10-CM | POA: Diagnosis not present

## 2017-07-17 LAB — CUP PACEART INCLINIC DEVICE CHECK
HighPow Impedance: 69 Ohm
HighPow Impedance: 69 Ohm
Implantable Lead Location: 753860
Implantable Lead Model: 292
Implantable Lead Serial Number: 446269
Implantable Pulse Generator Implant Date: 20190404
Lead Channel Pacing Threshold Amplitude: 0.8 V
Lead Channel Pacing Threshold Pulse Width: 0.4 ms
Lead Channel Setting Pacing Pulse Width: 0.4 ms
MDC IDC LEAD IMPLANT DT: 20190404
MDC IDC MSMT LEADCHNL RV IMPEDANCE VALUE: 500 Ohm
MDC IDC MSMT LEADCHNL RV SENSING INTR AMPL: 10.8 mV
MDC IDC PG SERIAL: 244129
MDC IDC SESS DTM: 20190418040000
MDC IDC SET LEADCHNL RV PACING AMPLITUDE: 3.5 V
MDC IDC SET LEADCHNL RV SENSING SENSITIVITY: 0.5 mV

## 2017-07-17 LAB — MAGNESIUM: Magnesium: 2.1 mg/dL (ref 1.7–2.4)

## 2017-07-17 LAB — BASIC METABOLIC PANEL
ANION GAP: 9 (ref 5–15)
BUN: 14 mg/dL (ref 6–20)
CALCIUM: 9.2 mg/dL (ref 8.9–10.3)
CO2: 25 mmol/L (ref 22–32)
Chloride: 102 mmol/L (ref 101–111)
Creatinine, Ser: 1.18 mg/dL (ref 0.61–1.24)
Glucose, Bld: 91 mg/dL (ref 65–99)
POTASSIUM: 3.9 mmol/L (ref 3.5–5.1)
SODIUM: 136 mmol/L (ref 135–145)

## 2017-07-17 LAB — POCT INR: INR: 2.7

## 2017-07-17 NOTE — Progress Notes (Signed)
Patient ID: Robert Booth, male   DOB: Apr 04, 1970, 47 y.o.   MRN: [id number] \  Advanced Heart Failure Clinic Note   [facility]P: [facility]  Primary HF Cardiologist: Dr Gala Romney  HPI: Robert Booth is a 47 y.o. male with hx of systolic HF with NICM secondary to prolonged ETOH abuse, EF 15% 2018, and CKD stage 2.  In 8/16, he presented to [facility] with hemoptysis. Found to have RLL PNA and cardiogenic shock. Treated briefly with milrinone and levophed. Diuresed well. Discharge weight 177 .   Hospitalized 9/2 through 12/05/2016 with recurrent A/C systolic heart failure in the setting of medication noncompliance. He had been oout of medications for 4 months. Echo with EF 10-15%   Diuresed with IV lasix.  HF meds restarted. D/c weight 177 lbs  ECHO 04/2017 EF ~20%. RV moderately dilated. Pt underwent ICD placement 07/03/17 - [employer].  Admitted to ED 07/08/17 with ICD shock. Had VT with ATP x 2 and shock x 1. Electrolytes stable. No clear inciting cause.  He presents today for post hospital follow up. Has felt better since discharge. He has had mild episodes of lightheadedness initially, but has improved on mexilitene. Denies PND or orthopnea. Has walked up to 1/4 of a mile without symptoms. Weight at home stable in low 200s.  Taking all medications as directed. He feels like he can't take a "deep, satisfying" breathing, but otherwise has no complaints today.  Walking daily.   Echo 10/13/14 EF 20% Echo 12/16 EF 35 - 40%No LV clot Echo 11/30/15 EF 35% Echo  11/2016 EF 10-15%.  CMRI 05/20/2017  1. Mild to moderately dilated LV with EF 19%. Diffuse hypokinesis with septal-lateral dyssynchrony. There is a small thinned area of the basal inferoseptal wall, there does not appear to be an actual VSD. No LV thrombus noted. 2.  Normal RV size with mildly decreased systolic function. 3. Noncoronary LGE pattern as noted above. This could be suggestive of prior myocarditis, less likely  cardiac sarcoidosis.  Review of systems complete and found to be negative unless listed in HPI.    SH:  Social History   Socioeconomic History  . Marital status: Single    Spouse name: Not on file  . Number of children: 6  . Years of education: Not on file  . Highest education level: Not on file  Occupational History  . Occupation: Unemployed  Social Needs  . Financial resource strain: Not on file  . Food insecurity:    Worry: Not on file    Inability: Not on file  . Transportation needs:    Medical: Not on file    Non-medical: Not on file  Tobacco Use  . Smoking status: Former Smoker    Types: Cigarettes  . Smokeless tobacco: Former Neurosurgeon    Types: Chew  . Tobacco comment: Was a rare smoker  Substance and Sexual Activity  . Alcohol use: No    Alcohol/week: 0.0 oz  . Drug use: No  . Sexual activity: Yes  Lifestyle  . Physical activity:    Days per week: Not on file    Minutes per session: Not on file  . Stress: Not on file  Relationships  . Social connections:    Talks on phone: Not on file    Gets together: Not on file    Attends religious service: Not on file    Active member of club or organization: Not on file    Attends meetings of clubs or organizations:  Not on file    Relationship status: Not on file  . Intimate partner violence:    Fear of current or ex partner: Not on file    Emotionally abused: Not on file    Physically abused: Not on file    Forced sexual activity: Not on file  Other Topics Concern  . Not on file  Social History Narrative   Lives with mom.      FH:  Family History  Problem Relation Age of Onset  . Hypertension Mother   . Hypertension Father   . Heart attack Father 75       died  . Hypertension Sister   . Heart attack Paternal Grandfather 50       died    Past Medical History:  Diagnosis Date  . AICD (automatic cardioverter/defibrillator) present 07/03/2017  . Anxiety   . CHF (congestive heart failure) (HCC)   .  Essential hypertension   . History of alcohol abuse    a. Sober since Feb 2016.  . 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 NICM (nonischemic cardiomyopathy) (HCC)    a. 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    Current Outpatient Medications  Medication Sig Dispense Refill  . acetaminophen (TYLENOL) 325 MG tablet Take 1-2 tablets (325-650 mg total) by mouth every 4 (four) hours as needed for mild pain.    . carvedilol (COREG) 12.5 MG tablet Take 1 tablet (12.5 mg total) by mouth 2 (two) times daily with a meal. 60 tablet 6  . digoxin (LANOXIN) 0.125 MG tablet Take 1 tablet (0.125 mg total) by mouth daily. 90 tablet 3  . folic acid (FOLVITE) 1 MG tablet TAKE 1 TABLET BY MOUTH DAILY (Patient taking differently: Take 1 mg by mouth once a day) 30 tablet 3  . furosemide (LASIX) 40 MG tablet Take 1 tablet (40 mg total) by mouth daily. Take an extra tab on Mondays and Fridays (Patient taking differently: Take 40-80 mg by mouth See admin instructions. Take 40 mg by mouth once a day on Sun/Tues/Wed/Thurs/Sat and 80 mg on Mon/Fri) 40 tablet 3  . hydrALAZINE (APRESOLINE) 50 MG tablet Take 1.5 tablets (75 mg total) by mouth 3 (three) times daily. 135 tablet 3  . isosorbide mononitrate (IMDUR) 30 MG 24 hr tablet Take 1 tablet (30 mg total) by mouth daily. 30 tablet 2  . mexiletine (MEXITIL) 200 MG capsule Take 1 capsule (200 mg total) by mouth every 12 (twelve) hours. 60 capsule 6  . Multiple Vitamin (MULTIVITAMIN WITH MINERALS) TABS tablet Take 1 tablet by mouth daily. 30 tablet 0  . sacubitril-valsartan (ENTRESTO) 97-103 MG Take 1 tablet by mouth 2 (two) times daily. 60 tablet 11  . spironolactone (ALDACTONE) 25 MG tablet TAKE 1 TABLET BY MOUTH ONCE DAILY (Patient taking differently: Take 25 mg  by mouth once a day) 34 tablet 2  . warfarin (COUMADIN) 7.5 MG tablet Please take as directed by coumadin clinic. (Patient taking differently: Take 7.5-11.25 mg by mouth See admin instructions. Take 11.25 mg by mouth at bedtime on Sun and 7.5 mg on Mon/Tues/Wed/Thurs/Fri/Sat) 45 tablet 0   No current facility-administered medications for this visit.    Vitals:   07/17/17 1001  BP: 120/88  Pulse: 70  SpO2: 97%  Weight: 209 lb 9.6 oz (  95.1 kg)   Wt Readings from Last 3 Encounters:  07/17/17 209 lb 9.6 oz (95.1 kg)  07/08/17 209 lb (94.8 kg)  07/04/17 209 lb 12.8 oz (95.2 kg)    PHYSICAL EXAM: General: Well appearing. No resp difficulty. HEENT: Normal Neck: Supple. JVP 6-7 cm. Carotids 2+ bilat; no bruits. No thyromegaly or nodule noted. Cor: PMI nondisplaced. RRR, No M/G/R noted. Left ICD site stable.  Lungs: CTAB, normal effort. Abdomen: Soft, non-tender, non-distended, no HSM. No bruits or masses. +BS  Extremities: No cyanosis, clubbing, or rash. R and LLE no edema.  Neuro: Alert & orientedx3, cranial nerves grossly intact. moves all 4 extremities w/o difficulty. Affect pleasant   ASSESSMENT & PLAN: 1. Chronic systolic HF - NICM Cath ok performed [place] 2016. 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 Echo 10/2016 EF 10-15%. LV dysfunction after stopping HF meds. - ECHO 04/16/2017 EF 15-20%.   - CMRI EF 19%. RV mildly decreased .  - Now s/p [employer] Sci ICD 07/03/17. Had ICD shock with VT 07/08/17 - NYHA III symptoms - Volume status on exam - Continue lasix 40 mg daily, with extra tab as needed.  - Continue Coreg 6.25 mg BID. Up-titration limited by fatigue.  - Continue digoxin 0.125 mg daily.  - Continue hydralazine 75 mg TID - Continue Imdur 30 mg daily - Continue entresto 97-103 mg twice a day  - Continue Spiro 25 mg daily.  - Reinforced fluid restriction to < 2 L daily, sodium restriction to less than 2000 mg daily, and the importance of daily weights.   2. LV mural thrombus  -  Continue warfarin for anticoagulation.  - Denies s/s of bleeding.   3. H/o ETOH abuse quit 05/25/2014 was drinking 1/5 liquor daily.  - No longer drinking.    4. VT 07/08/17  - He is s/p [place] Sci ICD 07/03/17 - Continue mexiletine 200 mg BID - No driving until 81/1/91 by [place] law.  - He has EP visit today.   5. HTN - Meds as above.   Graciella Freer, PA-C  9:23 AM   Greater than 50% of the 25 minute visit was spent in counseling/coordination of care regarding disease state education, salt/fluid restriction, sliding scale diuretics, and medication compliance.

## 2017-07-17 NOTE — Patient Instructions (Signed)
Routine lab work today. Will notify you of abnormal results, otherwise no news is good news!  No changes to medication at this time.  Follow up 4 weeks with Otilio Saber PA-C.  _________________________________________________________ [place] Code: 1200  Follow up 2-3 months with Dr. Gala Romney.  _________________________________________________________ [place] Code:  Take all medication as prescribed the day of your appointment. Bring all medications with you to your appointment.  Do the following things EVERYDAY: 1) Weigh yourself in the morning before breakfast. Write it down and keep it in a log. 2) Take your medicines as prescribed 3) Eat low salt foods-Limit salt (sodium) to 2000 mg per day.  4) Stay as active as you can everyday 5) Limit all fluids for the day to less than 2 liters

## 2017-07-17 NOTE — Patient Instructions (Addendum)
Description   Continue taking same dosage 1 tablet everyday except 1 and 1/2 tablets on Sundays. Recheck  INR in 3-4 weeks. Call us with any medication changes or concerns #[phone].

## 2017-07-17 NOTE — Progress Notes (Signed)
Wound check appointment. Steri-strips removed. Wound without redness or edema. Incision edges approximated, wound well healed. Normal device function. Thresholds, sensing, and impedances consistent with implant measurements. Device programmed at 3.5V for extra safety margin until 3 month visit. Histogram distribution appropriate for patient and level of activity. 1 VF episode treated successfully with shock pt went to ED seen by [facility] started on Mexiletine and increased Coreg. Patient educated about wound care, arm mobility, lifting restrictions, shock plan. ROV 10/15/17 w/ GT

## 2017-07-28 ENCOUNTER — Other Ambulatory Visit: Payer: Self-pay | Admitting: Internal Medicine

## 2017-07-28 MED FILL — FOLIC ACID 1 MG TABS: 1 | 30 days supply | Qty: 30 | Fill #1 | Status: TO

## 2017-07-28 MED FILL — WARFARIN SODIUM 7.5 MG TAB: 7.5 | 30 days supply | Qty: 45 | Fill #0 | Status: TO

## 2017-07-28 MED FILL — hydrALAZINE HCL 50 MG TABS: 50 | 34 days supply | Qty: 153 | Fill #0 | Status: TO

## 2017-07-28 MED FILL — FUROSEMIDE 40 MG TAB: 40 | 30 days supply | Qty: 30 | Fill #0

## 2017-08-01 DIAGNOSIS — Z736 Limitation of activities due to disability: Secondary | ICD-10-CM

## 2017-08-04 DIAGNOSIS — I10 Essential (primary) hypertension: Secondary | ICD-10-CM

## 2017-08-11 ENCOUNTER — Inpatient Hospital Stay (HOSPITAL_COMMUNITY): Admission: RE | Admit: 2017-08-11 | Payer: Medicaid Other | Source: Ambulatory Visit

## 2017-08-14 ENCOUNTER — Telehealth (HOSPITAL_COMMUNITY): Payer: Self-pay | Admitting: *Deleted

## 2017-08-14 ENCOUNTER — Ambulatory Visit (HOSPITAL_COMMUNITY)
Admission: RE | Admit: 2017-08-14 | Discharge: 2017-08-14 | Disposition: A | Discharge status: Home / Self Care | Payer: Medicaid Other | Source: Ambulatory Visit | Attending: Internal Medicine | Admitting: Internal Medicine

## 2017-08-14 ENCOUNTER — Encounter (HOSPITAL_COMMUNITY): Payer: Self-pay

## 2017-08-14 VITALS — BP 138/100 | HR 85 | Wt 197.6 lb

## 2017-08-14 DIAGNOSIS — Z87891 Personal history of nicotine dependence: Secondary | ICD-10-CM | POA: Insufficient documentation

## 2017-08-14 DIAGNOSIS — M109 Gout, unspecified: Secondary | ICD-10-CM

## 2017-08-14 DIAGNOSIS — Z7901 Long term (current) use of anticoagulants: Secondary | ICD-10-CM | POA: Diagnosis not present

## 2017-08-14 DIAGNOSIS — I5022 Chronic systolic (congestive) heart failure: Secondary | ICD-10-CM

## 2017-08-14 DIAGNOSIS — I10 Essential (primary) hypertension: Secondary | ICD-10-CM

## 2017-08-14 DIAGNOSIS — Z8249 Family history of ischemic heart disease and other diseases of the circulatory system: Secondary | ICD-10-CM | POA: Insufficient documentation

## 2017-08-14 DIAGNOSIS — R42 Dizziness and giddiness: Secondary | ICD-10-CM | POA: Insufficient documentation

## 2017-08-14 DIAGNOSIS — F1010 Alcohol abuse, uncomplicated: Secondary | ICD-10-CM | POA: Insufficient documentation

## 2017-08-14 DIAGNOSIS — Z79899 Other long term (current) drug therapy: Secondary | ICD-10-CM | POA: Diagnosis not present

## 2017-08-14 DIAGNOSIS — I428 Other cardiomyopathies: Secondary | ICD-10-CM

## 2017-08-14 DIAGNOSIS — M25572 Pain in left ankle and joints of left foot: Secondary | ICD-10-CM | POA: Diagnosis not present

## 2017-08-14 DIAGNOSIS — I130 Hypertensive heart and chronic kidney disease with heart failure and stage 1 through stage 4 chronic kidney disease, or unspecified chronic kidney disease: Secondary | ICD-10-CM | POA: Insufficient documentation

## 2017-08-14 DIAGNOSIS — F410 Panic disorder [episodic paroxysmal anxiety] without agoraphobia: Secondary | ICD-10-CM | POA: Insufficient documentation

## 2017-08-14 DIAGNOSIS — I429 Cardiomyopathy, unspecified: Secondary | ICD-10-CM | POA: Insufficient documentation

## 2017-08-14 DIAGNOSIS — I472 Ventricular tachycardia: Secondary | ICD-10-CM | POA: Diagnosis not present

## 2017-08-14 DIAGNOSIS — Z95810 Presence of automatic (implantable) cardiac defibrillator: Secondary | ICD-10-CM | POA: Diagnosis not present

## 2017-08-14 LAB — BASIC METABOLIC PANEL
ANION GAP: 11 (ref 5–15)
BUN: 14 mg/dL (ref 6–20)
CHLORIDE: 103 mmol/L (ref 101–111)
CO2: 25 mmol/L (ref 22–32)
Calcium: 9 mg/dL (ref 8.9–10.3)
Creatinine, Ser: 1.29 mg/dL — ABNORMAL HIGH (ref 0.61–1.24)
GFR calc Af Amer: >60 mL/min (ref >60)
GFR calc non Af Amer: >60 mL/min (ref >60)
GLUCOSE: 94 mg/dL (ref 65–99)
POTASSIUM: 3.8 mmol/L (ref 3.5–5.1)
Sodium: 139 mmol/L (ref 135–145)

## 2017-08-14 MED ORDER — COLCHICINE 0.6 MG PO TABS: 0.6000 mg | ORAL_TABLET | Freq: Every day | ORAL | 0 refills | Status: DC

## 2017-08-14 MED ORDER — VENLAFAXINE HCL ER 37.5 MG PO CP24: 37.5000 mg | ORAL_CAPSULE | Freq: Every day | ORAL | 11 refills | Status: DC

## 2017-08-14 MED FILL — VENLAFAXINE HCL ER 37.5 MG: 37.5 | 30 days supply | Qty: 30 | Fill #0

## 2017-08-14 MED FILL — ENTRESTO 97 MG-103 MG TAB: 97-103 | 30 days supply | Qty: 60 | Fill #2

## 2017-08-14 MED FILL — DIGOXIN 0.125 MG TABLET: 125 | 30 days supply | Qty: 30 | Fill #5

## 2017-08-14 NOTE — Progress Notes (Signed)
ReDS Vest - 08/14/17 1100      ReDS Vest   Fitting Posture  Standing    Height Marker  Tall    Ruler Value  13    Center Strip  Aligned    ReDS Value  34

## 2017-08-14 NOTE — Telephone Encounter (Signed)
Pharm called to inform us colchicine is not covered by patients insurance cash price Is $60 and patient cant afford it. Per Mardelle Matte patient can take Prednisone 40mg  for 3 days. Patient had a problem with fluid retention last time he took prednisone so Mardelle Matte said he should take an extra 40mg  of lasix on the days he takes prednisone. Attempted to reach patient left VM for him to call me back.

## 2017-08-14 NOTE — Patient Instructions (Signed)
Routine lab work today. Will notify you of abnormal results, otherwise no news is good news!  START Effexor 1 capsule once daily.  Take Colchicine once daily for one week for gout flare up. Take one tab now, then another tab one hour later.  Follow up with Dr. Gala Romney as scheduled.  Take all medication as prescribed the day of your appointment. Bring all medications with you to your appointment.  Do the following things EVERYDAY: 1) Weigh yourself in the morning before breakfast. Write it down and keep it in a log. 2) Take your medicines as prescribed 3) Eat low salt foods-Limit salt (sodium) to 2000 mg per day.  4) Stay as active as you can everyday 5) Limit all fluids for the day to less than 2 liters

## 2017-08-14 NOTE — Progress Notes (Signed)
Patient ID: Robert Booth, male   DOB: 1971-01-12, 47 y.o.   MRN: [id number] \  Advanced Heart Failure Clinic Note   PCP: [facility]  Primary HF Cardiologist: Dr Gala Romney  HPI: Robert Booth is a 47 y.o. male with hx of systolic HF with NICM secondary to prolonged ETOH abuse, EF 15% 2018, and CKD stage 2.  In 8/16, he presented to [facility] with hemoptysis. Found to have RLL PNA and cardiogenic shock. Treated briefly with milrinone and levophed. Diuresed well. Discharge weight 177 .   Hospitalized 9/2 through 12/05/2016 with recurrent A/C systolic heart failure in the setting of medication noncompliance. He had been oout of medications for 4 months. Echo with EF 10-15%   Diuresed with IV lasix.  HF meds restarted. D/c weight 177 lbs  ECHO 04/2017 EF ~20%. RV moderately dilated. Pt underwent ICD placement 07/03/17 - [employer].  Admitted to ED 07/08/17 with ICD shock. Had VT with ATP x 2 and shock x 1. Electrolytes stable. No clear inciting cause.  He presents today for HF follow up. He is not feeling well today. His main complaint is panic attacks that occur multiple times per day. He has associated SOB and chest discomfort that it lasts several minutes before he can calm himself down. He feels like his heart is fluttering and checks his pulse on his watch and gets readings 80-140s. He gets SOB with walking short distances. Denies orthopnea/PND. He has chronic left ankle edema with associated pain. He is occasionally dizzy after morning medications. Decreased energy level and decreased appetite. Denies CP. Weight at home is 196-197 lbs. BP at home 130/70s. Compliant with medications. Limiting fluid and salt. Takes extra lasix 2x/week.   ICD interrogated: One episode of NSVT, one episode of VT with no therapy both on 4/26. Thoracic impedence 46, active ~2.4 hours/day. HR 68-122, mean 89. Insufficient data for Heart Logic score.   Echo 10/13/14 EF 20% Echo 12/16 EF 35 - 40%No LV  clot Echo 11/30/15 EF 35% Echo  11/2016 EF 10-15%. Echo 04/2017 EF 15-20%  CMRI 05/20/2017  1. Mild to moderately dilated LV with EF 19%. Diffuse hypokinesis with septal-lateral dyssynchrony. There is a small thinned area of the basal inferoseptal wall, there does not appear to be an actual VSD. No LV thrombus noted. 2.  Normal RV size with mildly decreased systolic function. 3. Noncoronary LGE pattern as noted above. This could be suggestive of prior myocarditis, less likely cardiac sarcoidosis.  Review of systems complete and found to be negative unless listed in HPI.   SH:  Social History   Socioeconomic History  . Marital status: Single    Spouse name: Not on file  . Number of children: 6  . Years of education: Not on file  . Highest education level: Not on file  Occupational History  . Occupation: Unemployed  Social Needs  . Financial resource strain: Not on file  . Food insecurity:    Worry: Not on file    Inability: Not on file  . Transportation needs:    Medical: Not on file    Non-medical: Not on file  Tobacco Use  . Smoking status: Former Smoker    Types: Cigarettes  . Smokeless tobacco: Former Neurosurgeon    Types: Chew  . Tobacco comment: Was a rare smoker  Substance and Sexual Activity  . Alcohol use: No    Alcohol/week: 0.0 oz  . Drug use: No  . Sexual activity: Yes  Lifestyle  .  Physical activity:    Days per week: Not on file    Minutes per session: Not on file  . Stress: Not on file  Relationships  . Social connections:    Talks on phone: Not on file    Gets together: Not on file    Attends religious service: Not on file    Active member of club or organization: Not on file    Attends meetings of clubs or organizations: Not on file    Relationship status: Not on file  . Intimate partner violence:    Fear of current or ex partner: Not on file    Emotionally abused: Not on file    Physically abused: Not on file    Forced sexual activity: Not on  file  Other Topics Concern  . Not on file  Social History Narrative   Lives with mom.      FH:  Family History  Problem Relation Age of Onset  . Hypertension Mother   . Hypertension Father   . Heart attack Father 32       died  . Hypertension Sister   . Heart attack Paternal Grandfather 50       died    Past Medical History:  Diagnosis Date  . AICD (automatic cardioverter/defibrillator) present 07/03/2017  . Anxiety   . CHF (congestive heart failure) (HCC)   . Essential hypertension   . History of alcohol abuse    a. Sober since Feb 2016.  . 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 NICM (nonischemic cardiomyopathy) (HCC)    a. 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    Current Outpatient Medications  Medication Sig Dispense Refill  . acetaminophen (TYLENOL) 325 MG tablet Take 1-2 tablets (325-650 mg total) by mouth every 4 (four) hours as needed for mild pain.    . carvedilol (COREG) 12.5 MG tablet Take 1 tablet (12.5 mg total) by mouth 2 (two) times daily with a meal. 60 tablet 6  . digoxin (LANOXIN) 0.125 MG tablet Take 1 tablet (0.125 mg total) by mouth daily. 90 tablet 3  . folic acid (FOLVITE) 1 MG tablet TAKE 1 TABLET BY MOUTH DAILY (Patient taking differently: Take 1 mg by mouth once a day) 30 tablet 3  . furosemide (LASIX) 40 MG tablet Take 1 tablet (40 mg total) by mouth daily. Take an extra tab on Mondays and Fridays (Patient taking differently: Take 40-80 mg by mouth See admin instructions. Take 40 mg by mouth once a day on Sun/Tues/Wed/Thurs/Sat and 80 mg on Mon/Fri) 40 tablet 3  . hydrALAZINE (APRESOLINE) 50 MG tablet Take 1.5 tablets (75 mg total) by mouth 3 (three) times daily. 135 tablet 3  . isosorbide mononitrate (IMDUR) 30 MG 24 hr tablet Take 1  tablet (30 mg total) by mouth daily. 30 tablet 2  . mexiletine (MEXITIL) 200 MG capsule Take 1 capsule (200 mg total) by mouth every 12 (twelve) hours. 60 capsule 6  . Multiple Vitamin (MULTIVITAMIN WITH MINERALS) TABS tablet Take 1 tablet by mouth daily. 30 tablet 0  . sacubitril-valsartan (ENTRESTO) 97-103 MG Take 1 tablet by mouth 2 (two) times daily. 60 tablet 11  . spironolactone (ALDACTONE) 25 MG tablet TAKE 1 TABLET BY MOUTH ONCE DAILY (Patient taking differently: Take 25 mg by mouth once  a day) 34 tablet 2  . warfarin (COUMADIN) 7.5 MG tablet PLEASE TAKE AS DIRECTED BY COUMADIN CLINIC. 45 tablet 2   No current facility-administered medications for this encounter.    Vitals:   08/14/17 1058  BP: (!) 138/100  Pulse: 85  SpO2: 100%  Weight: 197 lb 9.6 oz (89.6 kg)   Wt Readings from Last 3 Encounters:  08/14/17 197 lb 9.6 oz (89.6 kg)  07/17/17 209 lb 9.6 oz (95.1 kg)  07/08/17 209 lb (94.8 kg)    PHYSICAL EXAM: General: Well appearing. No resp difficulty. HEENT: Normal Neck: Supple. JVP flat. Carotids 2+ bilat; no bruits. No thyromegaly or nodule noted. Cor: PMI nondisplaced. RRR, No M/G/R noted.  Lungs: CTAB, normal effort. Abdomen: Soft, non-tender, non-distended, no HSM. No bruits or masses. +BS  Extremities: No cyanosis, clubbing, or rash. R and LLE no edema.  Neuro: Alert & orientedx3, cranial nerves grossly intact. moves all 4 extremities w/o difficulty. Affect pleasant  Reds Vest: 34%  ASSESSMENT & PLAN: 1. Chronic systolic HF - NICM Cath ok performed [place] 2016. 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 Echo 10/2016 EF 10-15%. LV dysfunction after stopping HF meds. - ECHO 04/16/2017 EF 15-20%.   - CMRI EF 19%. RV mildly decreased .  - Now s/p [employer] Sci ICD 07/03/17. Had ICD shock with VT 07/08/17 - NYHA III - Volume status stable on exam and Reds Vest.  - Continue lasix 40 mg daily, with extra tablets 2x/week.  - Continue Coreg 6.25 mg BID. Up-titration limited by fatigue.    - Continue digoxin 0.125 mg daily.  - Continue hydralazine 75 mg TID - Continue Imdur 30 mg daily - Continue entresto 97-103 mg twice a day  - Continue Spiro 25 mg daily.  - Reinforced fluid restriction to < 2 L daily, sodium restriction to less than 2000 mg daily, and the importance of daily weights.   2. LV mural thrombus  - Continue warfarin for anticoagulation.  - Denies bleeding.   3. H/o ETOH abuse quit 05/25/2014 was drinking 1/5 liquor daily.  - No longer drinking. No change.    4. VT 07/08/17  - He is s/p [place] Sci ICD 07/03/17 - Continue mexiletine 200 mg BID. No change.  - No driving until 27/0/35 by [place] law.  - ICD interrogated: episode of VT and NSVT on 4/26 that did not require therapy.   5. HTN - Elevated today. Typically 130/80s at home. Will not adjust medications today with anxiety and dizziness.  6. Left foot pain - Uric acid elevated 05/2017 to 9.7 - Start colchicine 0.6 mg daily. He can take two doses today 1 hour apart.  - Encouraged him to make PCP appointment for further management.   7. Panic attacks - Related to ICD shock. Occurring multiple times/day - Start Effexor XR 37.5 mg daily  BMET today  Start colchicine 0.6 mg daily as above Start Effexor XR 37.5 mg daily. Discussed with PharmD best option with celexa allergy Keep follow up with Dr Gala Romney 09/29/17  Alford Highland, NP  11:14 AM   Greater than 50% of the 25 minute visit was spent in counseling/coordination of care regarding disease state education, salt/fluid restriction, sliding scale diuretics, and medication compliance.

## 2017-08-19 MED ORDER — PREDNISONE 20 MG PO TABS: ORAL_TABLET | ORAL | 0 refills | Status: DC

## 2017-08-19 MED FILL — predniSONE 20 MG TABS: 20 | 3 days supply | Qty: 6 | Fill #0

## 2017-08-19 NOTE — Telephone Encounter (Signed)
Patient called back and he is agreeable with plan.  Prednisone sent to [facility] outpatient pharmacy and patient will take an extra 40 mg lasix for those 3 days. No further questions.

## 2017-08-19 NOTE — Addendum Note (Signed)
Addended by: Georgina Peer on: 08/19/2017 10:29 AM   Modules accepted: Orders

## 2017-08-20 MED FILL — ISOSORBIDE MN ER 30 MG TAB: 30 | 34 days supply | Qty: 34 | Fill #2

## 2017-09-11 ENCOUNTER — Other Ambulatory Visit: Payer: Self-pay | Admitting: Internal Medicine

## 2017-09-29 ENCOUNTER — Encounter (HOSPITAL_COMMUNITY): Payer: Medicaid Other | Admitting: Internal Medicine

## 2017-10-06 MED FILL — CARVEDILOL 12.5 MG TABLET: 12.5 | 30 days supply | Qty: 60 | Fill #1 | Status: TO

## 2017-10-06 MED FILL — DIGOXIN 0.125 MG TABLET: 125 | 30 days supply | Qty: 30 | Fill #6 | Status: TO

## 2017-10-06 MED FILL — FOLIC ACID 1 MG TABS: 1 | 30 days supply | Qty: 30 | Fill #2 | Status: TO

## 2017-10-06 MED FILL — WARFARIN SODIUM 7.5 MG TAB: 7.5 | 30 days supply | Qty: 45 | Fill #1 | Status: TO

## 2017-10-06 MED FILL — VENLAFAXINE HCL ER 37.5 MG: 37.5 | 30 days supply | Qty: 30 | Fill #1 | Status: TO

## 2017-10-06 MED FILL — ENTRESTO 97 MG-103 MG TAB: 97-103 | 30 days supply | Qty: 60 | Fill #3 | Status: TO

## 2017-10-06 MED FILL — FUROSEMIDE 40 MG TAB: 40 | 30 days supply | Qty: 30 | Fill #1 | Status: TO

## 2017-10-06 MED FILL — ISOSORBIDE MN ER 30 MG TAB: 30 | 30 days supply | Qty: 30 | Fill #0 | Status: TO

## 2017-10-06 MED FILL — SPIRONOLACTONE 25 MG TABLET: 25 | 34 days supply | Qty: 34 | Fill #2

## 2017-10-15 ENCOUNTER — Encounter: Payer: Medicaid Other | Admitting: Internal Medicine

## 2017-10-28 ENCOUNTER — Ambulatory Visit (INDEPENDENT_AMBULATORY_CARE_PROVIDER_SITE_OTHER): Payer: Medicaid Other | Admitting: *Deleted

## 2017-10-28 DIAGNOSIS — I513 Intracardiac thrombosis, not elsewhere classified: Secondary | ICD-10-CM

## 2017-10-28 DIAGNOSIS — Z7901 Long term (current) use of anticoagulants: Secondary | ICD-10-CM

## 2017-10-28 DIAGNOSIS — I428 Other cardiomyopathies: Secondary | ICD-10-CM

## 2017-10-28 DIAGNOSIS — Z5181 Encounter for therapeutic drug level monitoring: Secondary | ICD-10-CM | POA: Diagnosis not present

## 2017-10-28 DIAGNOSIS — I240 Acute coronary thrombosis not resulting in myocardial infarction: Secondary | ICD-10-CM

## 2017-10-28 LAB — POCT INR: INR: 1.4 — AB (ref 2.0–3.0)

## 2017-10-28 NOTE — Patient Instructions (Signed)
Description   Today and tomorrow take 1.5 tablets then continue taking same dosage 1 tablet everyday except 1 and 1/2 tablets on Sundays. Recheck  INR in 1 week. Call us with any medication changes or concerns #[phone].

## 2017-12-08 ENCOUNTER — Encounter (HOSPITAL_COMMUNITY): Payer: Medicaid Other | Admitting: Internal Medicine

## 2017-12-11 ENCOUNTER — Encounter: Payer: Self-pay | Admitting: *Deleted

## 2017-12-11 ENCOUNTER — Telehealth: Payer: Self-pay | Admitting: *Deleted

## 2017-12-11 NOTE — Telephone Encounter (Signed)
ICD remote alert transmssion received. Suggests HF decompensation on HeartLogic since early August worsening.   Phone number must be out of service- no ring, automated message saying "the service you are attempting to reach is no longer available".  MyChart message sent to patient.

## 2017-12-30 ENCOUNTER — Encounter: Payer: Self-pay | Admitting: Internal Medicine

## 2017-12-30 ENCOUNTER — Ambulatory Visit (INDEPENDENT_AMBULATORY_CARE_PROVIDER_SITE_OTHER): Payer: Medicaid Other

## 2017-12-30 ENCOUNTER — Ambulatory Visit: Payer: Medicaid Other | Admitting: Internal Medicine

## 2017-12-30 ENCOUNTER — Encounter

## 2017-12-30 VITALS — BP 120/60 | HR 65 | Ht 69.0 in | Wt 196.0 lb

## 2017-12-30 DIAGNOSIS — I240 Acute coronary thrombosis not resulting in myocardial infarction: Secondary | ICD-10-CM

## 2017-12-30 DIAGNOSIS — I10 Essential (primary) hypertension: Secondary | ICD-10-CM | POA: Diagnosis not present

## 2017-12-30 DIAGNOSIS — I513 Intracardiac thrombosis, not elsewhere classified: Secondary | ICD-10-CM

## 2017-12-30 DIAGNOSIS — I5022 Chronic systolic (congestive) heart failure: Secondary | ICD-10-CM | POA: Diagnosis not present

## 2017-12-30 DIAGNOSIS — Z7901 Long term (current) use of anticoagulants: Secondary | ICD-10-CM

## 2017-12-30 DIAGNOSIS — Z95810 Presence of automatic (implantable) cardiac defibrillator: Secondary | ICD-10-CM

## 2017-12-30 LAB — CUP PACEART INCLINIC DEVICE CHECK
Date Time Interrogation Session: 20191001040000
HIGH POWER IMPEDANCE MEASURED VALUE: 63 Ohm
HIGH POWER IMPEDANCE MEASURED VALUE: 69 Ohm
Implantable Lead Implant Date: 20190404
Implantable Lead Location: 753860
Implantable Pulse Generator Implant Date: 20190404
Lead Channel Pacing Threshold Amplitude: 1 V
Lead Channel Pacing Threshold Pulse Width: 0.4 ms
Lead Channel Sensing Intrinsic Amplitude: 17.2 mV
Lead Channel Setting Pacing Pulse Width: 0.4 ms
MDC IDC LEAD SERIAL: 446269
MDC IDC MSMT LEADCHNL RV IMPEDANCE VALUE: 432 Ohm
MDC IDC SET LEADCHNL RV PACING AMPLITUDE: 2 V
MDC IDC SET LEADCHNL RV SENSING SENSITIVITY: 0.5 mV
MDC IDC STAT BRADY RV PERCENT PACED: 1 % — AB
Pulse Gen Serial Number: 244129

## 2017-12-30 LAB — POCT INR: INR: 1.8 — AB (ref 2.0–3.0)

## 2017-12-30 MED ORDER — SACUBITRIL-VALSARTAN 97-103 MG PO TABS: 1.0000 | ORAL_TABLET | Freq: Two times a day (BID) | ORAL | 3 refills | Status: DC

## 2017-12-30 NOTE — Progress Notes (Signed)
HPI Robert Booth returns today for ICD followup. He isa pleasant middle aged man with chronic systolic heart failure, EF 15%. He underwent ICD insertion for primary prevention several months ago. His has class 2-3 symptoms.  He recently had a CHF exacerbation and gained some weight. He is back close to his baseline. He denies chest pain . Minimal palpitations. He did have a single episode of VT, NS. He also appears to have episodes of SVT. Allergies  Allergen Reactions  . Celexa [Citalopram Hydrobromide] Other (See Comments)    Makes the patient disoriented  . Lisinopril Cough     Current Outpatient Medications  Medication Sig Dispense Refill  . acetaminophen (TYLENOL) 325 MG tablet Take 1-2 tablets (325-650 mg total) by mouth every 4 (four) hours as needed for mild pain.    . carvedilol (COREG) 12.5 MG tablet Take 1 tablet (12.5 mg total) by mouth 2 (two) times daily with a meal. 60 tablet 6  . colchicine 0.6 MG tablet Take 1 tablet (0.6 mg total) by mouth daily. 8 tablet 0  . digoxin (LANOXIN) 0.125 MG tablet Take 1 tablet (0.125 mg total) by mouth daily. 90 tablet 3  . folic acid (FOLVITE) 1 MG tablet TAKE 1 TABLET BY MOUTH DAILY 30 tablet 3  . furosemide (LASIX) 40 MG tablet Take 1 tablet (40 mg total) by mouth daily. Take an extra tab on Mondays and Fridays 40 tablet 3  . hydrALAZINE (APRESOLINE) 50 MG tablet Take 1.5 tablets (75 mg total) by mouth 3 (three) times daily. 135 tablet 3  . isosorbide mononitrate (IMDUR) 30 MG 24 hr tablet Take 1 tablet (30 mg total) by mouth daily. 30 tablet 2  . mexiletine (MEXITIL) 200 MG capsule Take 1 capsule (200 mg total) by mouth every 12 (twelve) hours. 60 capsule 6  . Multiple Vitamin (MULTIVITAMIN WITH MINERALS) TABS tablet Take 1 tablet by mouth daily. 30 tablet 0  . predniSONE (DELTASONE) 20 MG tablet Take 40 mg (2 Tablets) Once Daily for 3 days only. 6 tablet 0  . sacubitril-valsartan (ENTRESTO) 97-103 MG Take 1 tablet by mouth 2 (two)  times daily. 60 tablet 11  . spironolactone (ALDACTONE) 25 MG tablet TAKE 1 TABLET BY MOUTH ONCE DAILY 34 tablet 2  . venlafaxine XR (EFFEXOR XR) 37.5 MG 24 hr capsule Take 1 capsule (37.5 mg total) by mouth daily with breakfast. 30 capsule 11  . warfarin (COUMADIN) 7.5 MG tablet PLEASE TAKE AS DIRECTED BY COUMADIN CLINIC. 45 tablet 2   No current facility-administered medications for this visit.      Past Medical History:  Diagnosis Date  . AICD (automatic cardioverter/defibrillator) present 07/03/2017  . Anxiety   . CHF (congestive heart failure) (HCC)   . Essential hypertension   . History of alcohol abuse    a. Sober since Feb 2016.  . 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 NICM (nonischemic cardiomyopathy) (HCC)    a. 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    ROS:   All systems reviewed and negative except as noted in the HPI.   Past Surgical History:  Procedure Laterality Date  . CARDIAC CATHETERIZATION  05/2014   In [place], clean cors  . HERNIA REPAIR Right Inguinal  . ICD IMPLANT N/A 07/03/2017   Procedure:  ICD IMPLANT;  Surgeon: Marinus Maw, MD;  Location: [facility] INVASIVE CV LAB;  Service: Cardiovascular;  Laterality: N/A;  . KNEE SURGERY Right 1998     Family History  Problem Relation Age of Onset  . Hypertension Mother   . Hypertension Father   . Heart attack Father 1       died  . Hypertension Sister   . Heart attack Paternal Grandfather 41       died     Social History   Socioeconomic History  . Marital status: Single    Spouse name: Not on file  . Number of children: 6  . Years of education: Not on file  . Highest education level: Not on file  Occupational History  . Occupation: Unemployed  Social Needs  . Financial resource  strain: Not on file  . Food insecurity:    Worry: Not on file    Inability: Not on file  . Transportation needs:    Medical: Not on file    Non-medical: Not on file  Tobacco Use  . Smoking status: Former Smoker    Types: Cigarettes  . Smokeless tobacco: Former Neurosurgeon    Types: Chew  . Tobacco comment: Was a rare smoker  Substance and Sexual Activity  . Alcohol use: No    Alcohol/week: 0.0 standard drinks  . Drug use: No  . Sexual activity: Yes  Lifestyle  . Physical activity:    Days per week: Not on file    Minutes per session: Not on file  . Stress: Not on file  Relationships  . Social connections:    Talks on phone: Not on file    Gets together: Not on file    Attends religious service: Not on file    Active member of club or organization: Not on file    Attends meetings of clubs or organizations: Not on file    Relationship status: Not on file  . Intimate partner violence:    Fear of current or ex partner: Not on file    Emotionally abused: Not on file    Physically abused: Not on file    Forced sexual activity: Not on file  Other Topics Concern  . Not on file  Social History Narrative   Lives with mom.       BP 120/60   Pulse 65   Ht 5\' 9"  (1.753 m)   Wt 196 lb (88.9 kg)   BMI 28.94 kg/m   Physical Exam:  Well appearing NAD HEENT: Unremarkable Neck:  No JVD, no thyromegally Lymphatics:  No adenopathy Back:  No CVA tenderness Lungs:  Clear with no wheezes HEART:  Regular rate rhythm, no murmurs, no rubs, no clicks Abd:  soft, positive bowel sounds, no organomegally, no rebound, no guarding Ext:  2 plus pulses, no edema, no cyanosis, no clubbing Skin:  No rashes no nodules Neuro:  CN II through XII intact, motor grossly intact  EKG - NSR with NSSTW abnormality.  DEVICE  Normal device function.  See PaceArt for details.   Assess/Plan: 1. Chronic systolic heart failure - his symptoms are class 2. He will continue his current meds. He has run out of  his entresto and we will refill. 2. VT - he is minimally symptomatic. No indication for medical therapy at this time. 3. SVT - he is minimally symptomatic. If this gets worse we could consider catheter ablation.  4. HTN - his blood pressure is well controlled. No change in  his meds.  Leonia Reeves.D.

## 2017-12-30 NOTE — Patient Instructions (Signed)
Medication Instructions:  Your physician recommends that you continue on your current medications as directed. Please refer to the Current Medication list given to you today.  Labwork: None ordered.  Testing/Procedures: None ordered.  Follow-Up: Your physician wants you to follow-up in: 9 months with Dr. Ladona Ridgel.   You will receive a reminder letter in the mail two months in advance. If you don't receive a letter, please call our office to schedule the follow-up appointment.  Remote monitoring is used to monitor your ICD from home. This monitoring reduces the number of office visits required to check your device to one time per year. It allows [place] to keep an eye on the functioning of your device to ensure it is working properly. You are scheduled for a device check from home on 03/31/2018. You may send your transmission at any time that day. If you have a wireless device, the transmission will be sent automatically. After your physician reviews your transmission, you will receive a postcard with your next transmission date.  Any Other Special Instructions Will Be Listed Below (If Applicable).  If you need a refill on your cardiac medications before your next appointment, please call your pharmacy.

## 2017-12-30 NOTE — Patient Instructions (Signed)
Today take 1.5 tablets then continue taking same dosage 1 tablet everyday except 1 and 1/2 tablets on Sundays.  *Pt moving to [facility]* Recheck  INR on 10/23 @ appt w/ Dr. Gala Romney. Call us with any medication changes or concerns #[phone].

## 2018-01-02 ENCOUNTER — Telehealth: Payer: Self-pay

## 2018-01-02 NOTE — Telephone Encounter (Signed)
Referred to ICM clinic by Acie Fredrickson, Device RN after receiving Heartlogic Index Alert.   Spoke with patient and agreeable to monthly follow up.  Advised monitor will automatically transmit a report on scheduled date.  Advised will receive a call after the transmission is reviewed to provide results. Provided ICM number and explained should call if experiencing any fluid symptoms such as weight gain, shortness of breath or extremity/abdominal swelling.

## 2018-01-09 ENCOUNTER — Other Ambulatory Visit: Payer: Self-pay | Admitting: Physician Assistant

## 2018-01-09 ENCOUNTER — Other Ambulatory Visit: Payer: Self-pay | Admitting: *Deleted

## 2018-01-12 MED ORDER — WARFARIN SODIUM 7.5 MG PO TABS: ORAL_TABLET | ORAL | 0 refills | Status: DC

## 2018-01-12 NOTE — Telephone Encounter (Signed)
Dr Ladona Ridgel agreeable to monthly ICM follow up.

## 2018-01-14 ENCOUNTER — Ambulatory Visit (INDEPENDENT_AMBULATORY_CARE_PROVIDER_SITE_OTHER): Payer: Medicaid Other | Admitting: *Deleted

## 2018-01-14 DIAGNOSIS — I428 Other cardiomyopathies: Secondary | ICD-10-CM

## 2018-01-14 DIAGNOSIS — Z95810 Presence of automatic (implantable) cardiac defibrillator: Secondary | ICD-10-CM

## 2018-01-14 DIAGNOSIS — I5022 Chronic systolic (congestive) heart failure: Secondary | ICD-10-CM | POA: Diagnosis not present

## 2018-01-14 NOTE — Progress Notes (Signed)
Remote ICD transmission.   

## 2018-01-15 NOTE — Progress Notes (Signed)
EPIC Encounter for ICM Monitoring  Patient Name: Robert Booth is a 47 y.o. male Date: 01/15/2018 Primary Care Physican: System, Pcp Not In Primary Cardiologist: Bensimhon Electrophysiologist: Ladona Ridgel Dry Weight: unknown        1st ICM Remote.  Heart Failure questions reviewed, pt asymptomatic.  He said when he does have fluid he can tell because he can't breathing when lying down at night and feels very congested.  He usually takes extra fluid pill when needed   HeartLogic Heart Failure Index is 0.  Index has not crossed the threshold suggesting normal.  Prescribed:  Furosemide 40 mg Take 1 tablet (40 mg total) by mouth daily. Take an extra tab on Mondays and Fridays  Recommendations: No changes.  Encouraged to call for fluid symptoms.  Follow-up plan: ICM clinic phone appointment on 02/16/2018.  Office appointment scheduled 01/21/2018 with Dr. Gala Romney.        Copy of ICM check sent to Dr. Ladona Ridgel.    3 Month Trend    8 Day Data Trend            Karie Soda, RN 01/15/2018 5:10 PM

## 2018-01-16 ENCOUNTER — Encounter: Payer: Self-pay | Admitting: Cardiology

## 2018-01-21 ENCOUNTER — Encounter (HOSPITAL_COMMUNITY): Payer: Self-pay | Admitting: Internal Medicine

## 2018-01-21 ENCOUNTER — Ambulatory Visit: Payer: Self-pay | Admitting: Internal Medicine

## 2018-01-21 ENCOUNTER — Encounter (HOSPITAL_COMMUNITY): Payer: Self-pay | Admitting: *Deleted

## 2018-01-21 ENCOUNTER — Ambulatory Visit (HOSPITAL_COMMUNITY)
Admission: RE | Admit: 2018-01-21 | Discharge: 2018-01-21 | Disposition: A | Discharge status: Home / Self Care | Payer: Medicaid Other | Source: Ambulatory Visit | Attending: Internal Medicine | Admitting: Internal Medicine

## 2018-01-21 ENCOUNTER — Other Ambulatory Visit: Payer: Self-pay

## 2018-01-21 VITALS — BP 110/76 | HR 63 | Wt 199.8 lb

## 2018-01-21 DIAGNOSIS — Z86718 Personal history of other venous thrombosis and embolism: Secondary | ICD-10-CM | POA: Diagnosis not present

## 2018-01-21 DIAGNOSIS — Z79899 Other long term (current) drug therapy: Secondary | ICD-10-CM | POA: Insufficient documentation

## 2018-01-21 DIAGNOSIS — Z87891 Personal history of nicotine dependence: Secondary | ICD-10-CM | POA: Insufficient documentation

## 2018-01-21 DIAGNOSIS — Z95810 Presence of automatic (implantable) cardiac defibrillator: Secondary | ICD-10-CM | POA: Diagnosis not present

## 2018-01-21 DIAGNOSIS — I513 Intracardiac thrombosis, not elsewhere classified: Secondary | ICD-10-CM

## 2018-01-21 DIAGNOSIS — I428 Other cardiomyopathies: Secondary | ICD-10-CM | POA: Insufficient documentation

## 2018-01-21 DIAGNOSIS — I472 Ventricular tachycardia: Secondary | ICD-10-CM | POA: Diagnosis not present

## 2018-01-21 DIAGNOSIS — I130 Hypertensive heart and chronic kidney disease with heart failure and stage 1 through stage 4 chronic kidney disease, or unspecified chronic kidney disease: Secondary | ICD-10-CM | POA: Diagnosis not present

## 2018-01-21 DIAGNOSIS — I5022 Chronic systolic (congestive) heart failure: Secondary | ICD-10-CM | POA: Insufficient documentation

## 2018-01-21 DIAGNOSIS — F419 Anxiety disorder, unspecified: Secondary | ICD-10-CM | POA: Insufficient documentation

## 2018-01-21 DIAGNOSIS — N182 Chronic kidney disease, stage 2 (mild): Secondary | ICD-10-CM | POA: Insufficient documentation

## 2018-01-21 DIAGNOSIS — M109 Gout, unspecified: Secondary | ICD-10-CM | POA: Diagnosis not present

## 2018-01-21 DIAGNOSIS — Z8249 Family history of ischemic heart disease and other diseases of the circulatory system: Secondary | ICD-10-CM | POA: Diagnosis not present

## 2018-01-21 DIAGNOSIS — Z7901 Long term (current) use of anticoagulants: Secondary | ICD-10-CM

## 2018-01-21 DIAGNOSIS — F1011 Alcohol abuse, in remission: Secondary | ICD-10-CM | POA: Diagnosis not present

## 2018-01-21 DIAGNOSIS — I240 Acute coronary thrombosis not resulting in myocardial infarction: Secondary | ICD-10-CM

## 2018-01-21 LAB — COMPREHENSIVE METABOLIC PANEL
ALT: 27 U/L (ref 0–44)
ANION GAP: 11 (ref 5–15)
AST: 26 U/L (ref 15–41)
Albumin: 4.2 g/dL (ref 3.5–5.0)
Alkaline Phosphatase: 46 U/L (ref 38–126)
BILIRUBIN TOTAL: 1 mg/dL (ref 0.3–1.2)
BUN: 12 mg/dL (ref 6–20)
CO2: 25 mmol/L (ref 22–32)
Calcium: 9.4 mg/dL (ref 8.9–10.3)
Chloride: 103 mmol/L (ref 98–111)
Creatinine, Ser: 1.17 mg/dL (ref 0.61–1.24)
GFR calc Af Amer: >60 mL/min (ref >60)
GFR calc non Af Amer: >60 mL/min (ref >60)
Glucose, Bld: 93 mg/dL (ref 70–99)
POTASSIUM: 4.1 mmol/L (ref 3.5–5.1)
SODIUM: 139 mmol/L (ref 135–145)
TOTAL PROTEIN: 8 g/dL (ref 6.5–8.1)

## 2018-01-21 LAB — CBC
HEMATOCRIT: 43.1 % (ref 39.0–52.0)
HEMOGLOBIN: 14.1 g/dL (ref 13.0–17.0)
MCH: 29.6 pg (ref 26.0–34.0)
MCHC: 32.7 g/dL (ref 30.0–36.0)
MCV: 90.4 fL (ref 80.0–100.0)
PLATELETS: 213 10*3/uL (ref 150–400)
RBC: 4.77 MIL/uL (ref 4.22–5.81)
RDW: 12.7 % (ref 11.5–15.5)
WBC: 5.4 10*3/uL (ref 4.0–10.5)
nRBC: 0 % (ref 0.0–0.2)

## 2018-01-21 LAB — BRAIN NATRIURETIC PEPTIDE: B Natriuretic Peptide: 145.6 pg/mL — ABNORMAL HIGH (ref 0.0–100.0)

## 2018-01-21 LAB — PROTIME-INR
INR: 1.6
PROTHROMBIN TIME: 18.9 s — AB (ref 11.4–15.2)

## 2018-01-21 MED ORDER — ALLOPURINOL 300 MG PO TABS: 300.0000 mg | ORAL_TABLET | Freq: Every day | ORAL | 6 refills | Status: DC

## 2018-01-21 MED ORDER — ISOSORBIDE MONONITRATE ER 60 MG PO TB24: 60.0000 mg | ORAL_TABLET | Freq: Every day | ORAL | 6 refills | Status: DC

## 2018-01-21 NOTE — Progress Notes (Signed)
Patient ID: Shinya Neglia, male   DOB: 06-26-1970, 47 y.o.   MRN: [id number] \  Advanced Heart Failure Clinic Note   PCP: [facility].  Primary HF Cardiologist: Dr Gala Romney  HPI: Vedansh Muratalla is a 47 y.o. male with hx of systolic HF with NICM secondary to prolonged ETOH abuse, EF 15% 2018, and CKD stage 2.  In 8/16, he presented to [facility] with hemoptysis. Found to have RLL PNA and cardiogenic shock. Treated briefly with milrinone and levophed. Diuresed well. Discharge weight 177 .   Hospitalized 9/2 through 12/05/2016 with recurrent A/C systolic heart failure in the setting of medication noncompliance. He had been oout of medications for 4 months. Echo with EF 10-15%   Diuresed with IV lasix.  HF meds restarted. D/c weight 177 lbs  ECHO 04/2017 EF ~20%. RV moderately dilated. Pt underwent ICD placement 07/03/17 - [employer].  Admitted to ED 07/08/17 with ICD shock. Had VT with ATP x 2 and shock x 1. Electrolytes stable. No clear inciting cause.  In September fluid was up drinking too much Gatorade. Was called by Randon Goldsmith and lasix adjusted.   He presents today for HF follow up. Feeling much better. Has adjusted his fluid intake. Has had a few gout flares but treated with prednisone and colchicine. Taking lasix 40 daily and takes extra about once a week. Weight stable at 197. Switched to plant-based diet.    ICD interrogated: HL score 0. Volume back down. Activity level 4.3hr/day No AF. Occasional brief episodes of NSVT Personally reviewed   Echo 10/13/14 EF 20% Echo 12/16 EF 35 - 40%No LV clot Echo 11/30/15 EF 35% Echo  11/2016 EF 10-15%. Echo 04/2017 EF 15-20%  CMRI 05/20/2017  1. Mild to moderately dilated LV with EF 19%. Diffuse hypokinesis with septal-lateral dyssynchrony. There is a small thinned area of the basal inferoseptal wall, there does not appear to be an actual VSD. No LV thrombus noted. 2.  Normal RV size with mildly decreased systolic  function. 3. Noncoronary LGE pattern as noted above. This could be suggestive of prior myocarditis, less likely cardiac sarcoidosis.  Review of systems complete and found to be negative unless listed in HPI.   SH:  Social History   Socioeconomic History  . Marital status: Single    Spouse name: Not on file  . Number of children: 6  . Years of education: Not on file  . Highest education level: Not on file  Occupational History  . Occupation: Unemployed  Social Needs  . Financial resource strain: Not on file  . Food insecurity:    Worry: Not on file    Inability: Not on file  . Transportation needs:    Medical: Not on file    Non-medical: Not on file  Tobacco Use  . Smoking status: Former Smoker    Types: Cigarettes  . Smokeless tobacco: Former Neurosurgeon    Types: Chew  . Tobacco comment: Was a rare smoker  Substance and Sexual Activity  . Alcohol use: No    Alcohol/week: 0.0 standard drinks  . Drug use: No  . Sexual activity: Yes  Lifestyle  . Physical activity:    Days per week: Not on file    Minutes per session: Not on file  . Stress: Not on file  Relationships  . Social connections:    Talks on phone: Not on file    Gets together: Not on file    Attends religious service: Not on file  Active member of club or organization: Not on file    Attends meetings of clubs or organizations: Not on file    Relationship status: Not on file  . Intimate partner violence:    Fear of current or ex partner: Not on file    Emotionally abused: Not on file    Physically abused: Not on file    Forced sexual activity: Not on file  Other Topics Concern  . Not on file  Social History Narrative   Lives with mom.      FH:  Family History  Problem Relation Age of Onset  . Hypertension Mother   . Hypertension Father   . Heart attack Father 22       died  . Hypertension Sister   . Heart attack Paternal Grandfather 50       died    Past Medical History:  Diagnosis Date  .  AICD (automatic cardioverter/defibrillator) present 07/03/2017  . Anxiety   . CHF (congestive heart failure) (HCC)   . Essential hypertension   . History of alcohol abuse    a. Sober since Feb 2016.  . 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 NICM (nonischemic cardiomyopathy) (HCC)    a. 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    Current Outpatient Medications  Medication Sig Dispense Refill  . acetaminophen (TYLENOL) 325 MG tablet Take 1-2 tablets (325-650 mg total) by mouth every 4 (four) hours as needed for mild pain.    . carvedilol (COREG) 12.5 MG tablet Take 1 tablet (12.5 mg total) by mouth 2 (two) times daily with a meal. 60 tablet 6  . digoxin (LANOXIN) 0.125 MG tablet Take 1 tablet (0.125 mg total) by mouth daily. 90 tablet 3  . folic acid (FOLVITE) 1 MG tablet TAKE 1 TABLET BY MOUTH DAILY 30 tablet 3  . furosemide (LASIX) 40 MG tablet Take 1 tablet (40 mg total) by mouth daily. Take an extra tab on Mondays and Fridays 40 tablet 3  . hydrALAZINE (APRESOLINE) 50 MG tablet Take 1.5 tablets (75 mg total) by mouth 3 (three) times daily. 135 tablet 3  . isosorbide mononitrate (IMDUR) 30 MG 24 hr tablet Take 1 tablet (30 mg total) by mouth daily. 30 tablet 2  . mexiletine (MEXITIL) 200 MG capsule Take 1 capsule (200 mg total) by mouth every 12 (twelve) hours. 60 capsule 6  . Multiple Vitamin (MULTIVITAMIN WITH MINERALS) TABS tablet Take 1 tablet by mouth daily. 30 tablet 0  . sacubitril-valsartan (ENTRESTO) 97-103 MG Take 1 tablet by mouth 2 (two) times daily. 180 tablet 3  . spironolactone (ALDACTONE) 25 MG tablet TAKE 1 TABLET BY MOUTH ONCE DAILY 34 tablet 2  . venlafaxine XR (EFFEXOR XR) 37.5 MG 24 hr capsule Take 1 capsule (37.5 mg total) by mouth daily with breakfast. 30  capsule 11  . warfarin (COUMADIN) 7.5 MG tablet Take as directed by [facility] 35 tablet 0   No current facility-administered medications for this encounter.    Vitals:   01/21/18 1000  BP: 110/76  Pulse: 63  SpO2: 100%  Weight: 90.6 kg (199 lb 12.8 oz)   Wt Readings from Last 3 Encounters:  01/21/18 90.6 kg (199 lb 12.8 oz)  12/30/17 88.9 kg (196 lb)  08/14/17 89.6 kg (197 lb 9.6  oz)    PHYSICAL EXAM: General:  Well appearing. No resp difficulty HEENT: normal Neck: supple. no JVD. Carotids 2+ bilat; no bruits. No lymphadenopathy or thryomegaly appreciated. Cor: PMI nondisplaced. Regular rate & rhythm. No rubs, gallops or murmurs. Lungs: clear Abdomen: soft, nontender, nondistended. No hepatosplenomegaly. No bruits or masses. Good bowel sounds. Extremities: no cyanosis, clubbing, rash, edema Neuro: alert & orientedx3, cranial nerves grossly intact. moves all 4 extremities w/o difficulty. Affect pleasant   ASSESSMENT & PLAN: 1. Chronic systolic HF - NICM Cath ok performed [place] 2016. 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 Echo 10/2016 EF 10-15%. LV dysfunction after stopping HF meds. - ECHO 04/16/2017 EF 15-20%.   - CMRI EF 19%. RV mildly decreased .  - Now s/p [employer] ICD 07/03/17. Had ICD shock with VT 07/08/17 - Much improved. NYHA II. Volume status looks good.  - I did bedside echo and EF still 20% - Continue lasix 40 mg daily, with extra as needed - Continue Coreg 6.25 mg BID. Up-titration limited by fatigue.  - Continue digoxin 0.125 mg daily.  - Continue hydralazine 75 mg TID - Increase Imdur to 60 mg daily - Continue entresto 97-103 mg twice a day  - Continue Spiro 25 mg daily.  - Reinforced fluid restriction to < 2 L daily, sodium restriction to less than 2000 mg daily, and the importance of daily weights.   2. LV mural thrombus  - Continue warfarin for anticoagulation. Check INR today. Can consider stopping AC if/when EF improvess - Denies bleeding.   3. H/o ETOH  abuse quit 05/25/2014 was drinking 1/5 liquor daily.  - No longer drinking. No change.    4. VT 07/08/17  - He is s/p [employer] ICD 07/03/17 - Continue mexiletine 200 mg BID. No change.  - No driving until 16/1/09 by [place] law. Can resume now   - ICD interrogated: No further VT. HL score as above  5. HTN -Blood pressure well controlled. Continue current regimen.  6. Gout - Start allopurinol 300 daily.    Arvilla Meres, MD  10:32 AM

## 2018-01-21 NOTE — Patient Instructions (Addendum)
Increase Imdur to 60mg  (1 tab) daily  Allopurinol 300mg  (1 tab) daily  Your physician has requested that you have an echocardiogram. Echocardiography is a painless test that uses sound waves to create images of your heart. It provides your doctor with information about the size and shape of your heart and how well your heart's chambers and valves are working. This procedure takes approximately one hour. There are no restrictions for this procedure.   Your physician recommends that you schedule a follow-up appointment in: 4 months

## 2018-01-29 ENCOUNTER — Other Ambulatory Visit (HOSPITAL_COMMUNITY): Payer: Self-pay | Admitting: Adult Health

## 2018-02-07 LAB — PROTIME-INR: INR: 2.3 — AB (ref 0.9–1.1)

## 2018-02-08 ENCOUNTER — Other Ambulatory Visit (HOSPITAL_COMMUNITY): Payer: Self-pay | Admitting: Internal Medicine

## 2018-02-11 ENCOUNTER — Encounter (HOSPITAL_COMMUNITY): Payer: Self-pay | Admitting: *Deleted

## 2018-02-16 ENCOUNTER — Telehealth: Payer: Self-pay

## 2018-02-16 ENCOUNTER — Other Ambulatory Visit (HOSPITAL_COMMUNITY): Payer: Self-pay | Admitting: Internal Medicine

## 2018-02-16 ENCOUNTER — Ambulatory Visit (INDEPENDENT_AMBULATORY_CARE_PROVIDER_SITE_OTHER): Payer: Medicaid Other

## 2018-02-16 DIAGNOSIS — I5022 Chronic systolic (congestive) heart failure: Secondary | ICD-10-CM | POA: Diagnosis not present

## 2018-02-16 DIAGNOSIS — Z95810 Presence of automatic (implantable) cardiac defibrillator: Secondary | ICD-10-CM | POA: Diagnosis not present

## 2018-02-16 NOTE — Telephone Encounter (Signed)
I spoke with the patient about this.

## 2018-02-19 NOTE — Progress Notes (Signed)
EPIC Encounter for ICM Monitoring  Patient Name: Robert Booth is a 47 y.o. male Date: 02/19/2018 Primary Care Physican: System, Pcp Not In Primary Cardiologist: Bensimhon Electrophysiologist: Ladona Ridgel Today's Weight: 189.8 lbs        Heart Failure questions reviewed, pt asymptomatic.  Patient is on a plant based diet and losing weight.  He feels good at this time.    HeartLogic Heart Failure Index is 0.  Index has not crossed the threshold suggesting normal.        Prescribed: Furosemide 40 mg Take 1 tablet (40 mg total) by mouth daily. Take an extra tab on Mondays and Fridays. Per Dr Melburn Popper 01/21/2018 office note, should continuelasix 40 mg daily, with extra as needed  Recommendations: No changes.  Advised to limit salt intake to 2000 mg/day and fluid intake to < 2 liters/day.  Encouraged to call for fluid symptoms.  Follow-up plan: ICM clinic phone appointment on 03/19/2018.          Copy of ICM check sent to Dr. Ladona Ridgel.    3 Month Trend    8 Day Data Trend            Karie Soda, RN 02/19/2018 9:27 AM

## 2018-02-28 ENCOUNTER — Other Ambulatory Visit (HOSPITAL_COMMUNITY): Payer: Self-pay | Admitting: Internal Medicine

## 2018-03-19 ENCOUNTER — Ambulatory Visit (INDEPENDENT_AMBULATORY_CARE_PROVIDER_SITE_OTHER): Payer: Medicaid Other

## 2018-03-19 DIAGNOSIS — I5022 Chronic systolic (congestive) heart failure: Secondary | ICD-10-CM | POA: Diagnosis not present

## 2018-03-19 DIAGNOSIS — Z95810 Presence of automatic (implantable) cardiac defibrillator: Secondary | ICD-10-CM | POA: Diagnosis not present

## 2018-03-19 LAB — CUP PACEART REMOTE DEVICE CHECK
Implantable Lead Serial Number: 446269
Implantable Pulse Generator Implant Date: 20190404
MDC IDC LEAD IMPLANT DT: 20190404
MDC IDC LEAD LOCATION: 753860
MDC IDC SESS DTM: 20191219112516
Pulse Gen Serial Number: 244129

## 2018-03-20 NOTE — Progress Notes (Signed)
EPIC Encounter for ICM Monitoring  Patient Name: Robert Booth is a 47 y.o. male Date: 03/20/2018 Primary Care Physican: System, Pcp Not In Primary Cardiologist: Bensimhon Electrophysiologist: Ladona Ridgel Last Weight: 189.8 lbs Today's Weight: 190 lbs        Heart Failure questions reviewed, pt asymptomatic. Discussed limiting salt intake during holiday season.  Explained transmission is starting to show a little fluid accumulation.     HeartLogic Heart Failure Index is 10.  Index has not crossed the threshold suggesting normal.  Prescribed: Furosemide 40 mg Take 1 tablet (40 mg total) by mouth daily. Take an extra tab on Mondays and Fridays. Per Dr Melburn Popper 01/21/2018 office note, should continue lasix 40 mg daily, with extra as needed  Recommendations: No changes.  He said he will take an extra Furosemide tomorrow.   Encouraged to call for fluid symptoms.  Follow-up plan: ICM clinic phone appointment on 04/23/2018.          Copy of ICM check sent to Dr. Ladona Ridgel.    3 Month Trend    8 Day Data Trend           Karie Soda, RN 03/20/2018 7:35 AM

## 2018-04-06 ENCOUNTER — Other Ambulatory Visit: Payer: Self-pay | Admitting: Internal Medicine

## 2018-04-23 ENCOUNTER — Ambulatory Visit (INDEPENDENT_AMBULATORY_CARE_PROVIDER_SITE_OTHER): Payer: Medicaid Other

## 2018-04-23 DIAGNOSIS — Z95810 Presence of automatic (implantable) cardiac defibrillator: Secondary | ICD-10-CM | POA: Diagnosis not present

## 2018-04-23 DIAGNOSIS — I5022 Chronic systolic (congestive) heart failure: Secondary | ICD-10-CM

## 2018-04-23 DIAGNOSIS — I428 Other cardiomyopathies: Secondary | ICD-10-CM | POA: Diagnosis not present

## 2018-04-24 ENCOUNTER — Telehealth: Payer: Self-pay

## 2018-04-24 ENCOUNTER — Encounter: Payer: Self-pay | Admitting: Cardiology

## 2018-04-24 NOTE — Telephone Encounter (Signed)
PA  Form completed with Robert Booth for [employer]. Signed by Dr Ladona Ridgel and faxed.

## 2018-04-24 NOTE — Telephone Encounter (Signed)
Remote ICM transmission received.  Attempted call to patient regarding ICM remote transmission and no message.  °

## 2018-04-24 NOTE — Progress Notes (Signed)
Remote ICD transmission.   

## 2018-04-24 NOTE — Progress Notes (Signed)
EPIC Encounter for ICM Monitoring  Patient Name: Robert Booth is a 48 y.o. male Date: 04/24/2018 Primary Care Physican: System, Pcp Not In Primary Cardiologist:Bensimhon Electrophysiologist:Taylor Last Weight:190lbs Today's Weight: unknown                                                               Attempted call to patient.   Transmission reviewed.      HeartLogic Heart Failure Index is 2 and suggesting normal.  Prescribed: Furosemide 40 mgTake 1 tablet (40 mg total) by mouth daily. Take an extra tab on Mondays and Fridays. Per Dr Melburn Popper 01/21/2018 office note, should continue lasix 40 mg daily, with extra as needed  Recommendations: Unable to reach.  Follow-up plan: ICM clinic phone appointment on 06/02/2018.          Copy of ICM check sent to Dr. Ladona Ridgel.      Karie Soda, RN 04/24/2018 6:10 PM

## 2018-04-26 LAB — CUP PACEART REMOTE DEVICE CHECK
Date Time Interrogation Session: 20200126161118
Implantable Lead Implant Date: 20190404
Implantable Lead Location: 753860
Implantable Lead Model: 292
Implantable Lead Serial Number: 446269
MDC IDC PG IMPLANT DT: 20190404
Pulse Gen Serial Number: 244129

## 2018-04-30 ENCOUNTER — Ambulatory Visit (INDEPENDENT_AMBULATORY_CARE_PROVIDER_SITE_OTHER): Payer: Medicaid Other

## 2018-04-30 DIAGNOSIS — I5022 Chronic systolic (congestive) heart failure: Secondary | ICD-10-CM

## 2018-04-30 DIAGNOSIS — Z95810 Presence of automatic (implantable) cardiac defibrillator: Secondary | ICD-10-CM

## 2018-04-30 NOTE — Progress Notes (Signed)
Returned patient call as requested by voice mail message.  Patient asked if he should go to the hospital since the report is showing fluid.  Advised he does not need to go to the hospital since he is asymptomatic.  Advised to use ER when he has urgent symptoms such as breathing difficulty.  He is feeling fine at this time.  Advised to limit salt.  Encouraged to call back with any changes.

## 2018-04-30 NOTE — Progress Notes (Signed)
EPIC Encounter for ICM Monitoring  Patient Name: Robert Booth is a 48 y.o. male Date: 04/30/2018 Primary Care Physican: System, Pcp Not In Primary Cardiologist:Bensimhon Electrophysiologist:Taylor Last Weight: 190 lbs Today's Weight: 189 lbs        Heart Failure questions reviewed.   Pt asymptomatic. He has been eating large bag of potato chips the last few days   HeartLogic Heart Failure Index alert today and index has increased to 25 suggesting fluid. Index started rising 04/07/2018  Prescribed:Furosemide 40 mgTake 1 tablet (40 mg total) by mouth daily. Take an extra tab on Mondays and Fridays. Per Dr Melburn Popper 01/21/2018 office note, should continue lasix 40 mg daily, with extra as needed  Recommendations:  Patient said he will take extra 40 mg Furosemide x 2 days.  Advised to limit salt intake and chips are high in salt.  Encouraged to call for fluid symptoms.  Follow-up plan: ICM clinic phone appointment on 05/04/2018 to recheck fluid levels.   Copy of ICM check sent to Dr. Ladona Ridgel and Dr Gala Romney.        Karie Soda, RN 04/30/2018 2:08 PM

## 2018-05-04 ENCOUNTER — Ambulatory Visit (INDEPENDENT_AMBULATORY_CARE_PROVIDER_SITE_OTHER): Payer: Medicaid Other

## 2018-05-04 DIAGNOSIS — Z95810 Presence of automatic (implantable) cardiac defibrillator: Secondary | ICD-10-CM

## 2018-05-04 DIAGNOSIS — I5022 Chronic systolic (congestive) heart failure: Secondary | ICD-10-CM

## 2018-05-04 NOTE — Progress Notes (Signed)
EPIC Encounter for ICM Monitoring  Patient Name: Datron Lopiano is a 48 y.o. male Date: 05/04/2018 Primary Care Physican: System, Pcp Not In Primary Cardiologist:Bensimhon Electrophysiologist:Taylor Last Weight: 189 lbs Today's Weight: 189 lbs                                                            Heart Failure questions reviewed.   Pt asymptomatic.    HeartLogic Heart Failure Index alert today and index has decreased from 25 to 22 but continues to suggest fluid accumulation.  Index started rising 04/26/2018  Prescribed:Furosemide 40 mgTake 1 tablet (40 mg total) by mouth daily. Take an extra tab on Mondays and Fridays. Per Dr Melburn Popper 01/21/2018 office note, should continue lasix 40 mg daily, with extra as needed  Recommendations:  Advised to take extra 40 mg Furosemide x 2 days and then return to prescribed dosage.  Also encouraged to limit salt intake.    Follow-up plan: ICM clinic phone appointment on 05/18/2018 to recheck fluid levels.   Copy of ICM check sent to Dr. Ladona Ridgel and Dr Gala Romney.      8 Day Data Trend           Karie Soda, RN 05/04/2018 8:38 AM

## 2018-05-05 ENCOUNTER — Telehealth: Payer: Self-pay

## 2018-05-05 NOTE — Telephone Encounter (Signed)
Remote ICM transmission received.  Attempted call to patient regarding ICM remote transmission and left message, per DPR, to return call.    

## 2018-05-07 ENCOUNTER — Other Ambulatory Visit (HOSPITAL_COMMUNITY): Payer: Self-pay | Admitting: Adult Health

## 2018-05-11 ENCOUNTER — Telehealth (HOSPITAL_COMMUNITY): Payer: Self-pay | Admitting: Cardiology

## 2018-05-11 NOTE — Telephone Encounter (Signed)
Abnormal labs received Labs drawn 05/08/18 Cr 1.63 K 4.0 BUN 12 Na 138  Per Joanell Rising Check for symptoms Lightlessness, dizziness Keep appointment with DB will recheck at follow up   Patient report he did have an episode of lightheadedness last week has since subsided. Denies dizziness, SOB,CP Advised will have labs repeated at follow up. Be sure to keep follow up

## 2018-05-11 NOTE — Telephone Encounter (Signed)
Opened in error duplicate message

## 2018-05-13 ENCOUNTER — Other Ambulatory Visit (HOSPITAL_COMMUNITY): Payer: Self-pay | Admitting: Internal Medicine

## 2018-05-18 ENCOUNTER — Ambulatory Visit (INDEPENDENT_AMBULATORY_CARE_PROVIDER_SITE_OTHER): Payer: Medicaid Other

## 2018-05-18 DIAGNOSIS — I5022 Chronic systolic (congestive) heart failure: Secondary | ICD-10-CM | POA: Diagnosis not present

## 2018-05-18 DIAGNOSIS — Z95810 Presence of automatic (implantable) cardiac defibrillator: Secondary | ICD-10-CM | POA: Diagnosis not present

## 2018-05-19 ENCOUNTER — Telehealth: Payer: Self-pay

## 2018-05-19 NOTE — Telephone Encounter (Signed)
Left message for patient to remind of missed remote transmission.  

## 2018-05-21 ENCOUNTER — Telehealth (HOSPITAL_COMMUNITY): Payer: Self-pay | Admitting: *Deleted

## 2018-05-21 NOTE — Telephone Encounter (Signed)
Received call from Dr Nelwyn Salisbury at [facility] in [facility], pt was admitted there for CP, she states his Troponins have been negative, they did an echo and EF was 15-20%, she states they feel CP was probably related to panic attack as pt is going through a divorce.  She wanted to verify with [place] pt had no h/o CAD, when last cath was and verify he had an appt w/us next week.  Advised per chart ef stable, cath 2016 no cad, appt is sch for 05/26/2018 with Dr Gala Romney, pt was also supposed to have an echo done on that day, I have cancelled echo since they did one, she states she will send a disc and the report with patient for our review, she states if Dr Gala Romney has any further questions he can give her a call.

## 2018-05-25 ENCOUNTER — Telehealth: Payer: Self-pay

## 2018-05-25 NOTE — Telephone Encounter (Signed)
Remote ICM transmission received.  Attempted call to patient on both listed numbers regarding ICM remote transmission and mobile phone number is disconnected and no answering machine on home phone.

## 2018-05-25 NOTE — Progress Notes (Signed)
EPIC Encounter for ICM Monitoring  Patient Name: Robert Booth is a 48 y.o. male Date: 05/25/2018 Primary Care Physican: System, Pcp Not In Primary Cardiologist:Bensimhon Electrophysiologist:Taylor Last Weight:189 lbs Today's Weight:unknown    Attempted call to patient and unable to reach. Transmission reviewed.   Per Epic note, patient has [facility] hospital ER visit for chest pain which may have been related to panic attack due to patient going through divorce  HeartLogic Heart Failure Indexalert today and index has decreased from 26to 22 but continues to suggestfluid accumulation.   Prescribed:Furosemide 40 mgTake 1 tablet (40 mg total) by mouth daily. Take an extra tab on Mondays and Fridays. Per Dr Melburn Popper 01/21/2018 office note, should continue lasix 40 mg daily, with extra as needed  Recommendations: Unable to reach.     Follow-up plan: ICM clinic phone appointment on3/05/2018 and office visit with Dr Gala Romney 05/26/2018.         Copy of ICM check sent to Dr.Taylor and Dr Gala Romney.              Karie Soda, RN 05/25/2018 2:49 PM

## 2018-05-26 ENCOUNTER — Encounter (HOSPITAL_COMMUNITY): Payer: Self-pay | Admitting: Internal Medicine

## 2018-05-26 ENCOUNTER — Ambulatory Visit (HOSPITAL_COMMUNITY)
Admission: RE | Admit: 2018-05-26 | Discharge: 2018-05-26 | Disposition: A | Discharge status: Home / Self Care | Payer: Medicaid Other | Source: Ambulatory Visit | Attending: Internal Medicine | Admitting: Internal Medicine

## 2018-05-26 ENCOUNTER — Encounter (HOSPITAL_COMMUNITY): Payer: Medicaid Other | Admitting: Internal Medicine

## 2018-05-26 ENCOUNTER — Ambulatory Visit (HOSPITAL_COMMUNITY): Payer: Medicaid Other

## 2018-05-26 ENCOUNTER — Other Ambulatory Visit: Payer: Self-pay

## 2018-05-26 ENCOUNTER — Encounter (HOSPITAL_COMMUNITY): Payer: Medicaid Other

## 2018-05-26 VITALS — BP 126/74 | HR 78 | Wt 187.2 lb

## 2018-05-26 DIAGNOSIS — N182 Chronic kidney disease, stage 2 (mild): Secondary | ICD-10-CM | POA: Insufficient documentation

## 2018-05-26 DIAGNOSIS — M109 Gout, unspecified: Secondary | ICD-10-CM | POA: Diagnosis not present

## 2018-05-26 DIAGNOSIS — Z8249 Family history of ischemic heart disease and other diseases of the circulatory system: Secondary | ICD-10-CM | POA: Insufficient documentation

## 2018-05-26 DIAGNOSIS — I5022 Chronic systolic (congestive) heart failure: Secondary | ICD-10-CM | POA: Diagnosis present

## 2018-05-26 DIAGNOSIS — I130 Hypertensive heart and chronic kidney disease with heart failure and stage 1 through stage 4 chronic kidney disease, or unspecified chronic kidney disease: Secondary | ICD-10-CM | POA: Insufficient documentation

## 2018-05-26 DIAGNOSIS — I428 Other cardiomyopathies: Secondary | ICD-10-CM | POA: Insufficient documentation

## 2018-05-26 DIAGNOSIS — I10 Essential (primary) hypertension: Secondary | ICD-10-CM | POA: Diagnosis not present

## 2018-05-26 DIAGNOSIS — I472 Ventricular tachycardia, unspecified: Secondary | ICD-10-CM

## 2018-05-26 DIAGNOSIS — I471 Supraventricular tachycardia: Secondary | ICD-10-CM | POA: Diagnosis not present

## 2018-05-26 DIAGNOSIS — Z87891 Personal history of nicotine dependence: Secondary | ICD-10-CM | POA: Insufficient documentation

## 2018-05-26 DIAGNOSIS — Z95810 Presence of automatic (implantable) cardiac defibrillator: Secondary | ICD-10-CM | POA: Diagnosis not present

## 2018-05-26 DIAGNOSIS — Z79899 Other long term (current) drug therapy: Secondary | ICD-10-CM | POA: Diagnosis not present

## 2018-05-26 DIAGNOSIS — Z7901 Long term (current) use of anticoagulants: Secondary | ICD-10-CM | POA: Insufficient documentation

## 2018-05-26 LAB — BASIC METABOLIC PANEL
Anion gap: 8 (ref 5–15)
BUN: 17 mg/dL (ref 6–20)
CALCIUM: 9.6 mg/dL (ref 8.9–10.3)
CO2: 28 mmol/L (ref 22–32)
Chloride: 101 mmol/L (ref 98–111)
Creatinine, Ser: 1.27 mg/dL — ABNORMAL HIGH (ref 0.61–1.24)
GFR calc Af Amer: >60 mL/min (ref >60)
GFR calc non Af Amer: >60 mL/min (ref >60)
GLUCOSE: 97 mg/dL (ref 70–99)
POTASSIUM: 4.1 mmol/L (ref 3.5–5.1)
Sodium: 137 mmol/L (ref 135–145)

## 2018-05-26 LAB — PROTIME-INR
INR: 1.3 — AB (ref 0.8–1.2)
Prothrombin Time: 16.4 seconds — ABNORMAL HIGH (ref 11.4–15.2)

## 2018-05-26 LAB — DIGOXIN LEVEL: DIGOXIN LVL: 0.5 ng/mL — AB (ref 0.8–2.0)

## 2018-05-26 LAB — BRAIN NATRIURETIC PEPTIDE: B NATRIURETIC PEPTIDE 5: 87.2 pg/mL (ref 0.0–100.0)

## 2018-05-26 NOTE — Progress Notes (Signed)
Patient ID: Huron Depaulis, male   DOB: 08/24/1970, 48 y.o.   MRN: [id number] \  Advanced Heart Failure Clinic Note   PCP: [facility]  Primary HF Cardiologist: Dr Gala Romney  HPI: Rubel Blondin is a 48 y.o. male with hx of systolic HF with NICM secondary to prolonged ETOH abuse, EF 15% 2018, and CKD stage 2.  In 8/16, he presented to [facility] with hemoptysis. Found to have RLL PNA and cardiogenic shock. Treated briefly with milrinone and levophed. Diuresed well. Discharge weight 177 .   Hospitalized 9/2 through 12/05/2016 with recurrent A/C systolic heart failure in the setting of medication noncompliance. He had been oout of medications for 4 months. Echo with EF 10-15%   Diuresed with IV lasix.  HF meds restarted. D/c weight 177 lbs  ECHO 04/2017 EF ~20%. RV moderately dilated. Pt underwent ICD placement 07/03/17 - [employer].  Admitted to ED 07/08/17 with ICD shock. Had VT with ATP x 2 and shock x 1. Electrolytes stable. No clear inciting cause.  In September 2019 fluid was up drinking too much Gatorade. Was called by Randon Goldsmith and lasix adjusted.   EF improved to 35-40% then stopped taking meds and EF back down   Last week presented to OSH with CP. Troponins negative. Echo EF 15-20%   He presents today for HF follow up. Feeling much better. Taking all meds again for ~ 1 month. No edema. Back to jogging in place. No CP or SOB. No orthopnea or PND. Taking lasix 40mg  daily. Never has to take extra. Avoids salt. BP drops < 100 at times    Bedside echo today 15-20%    ICD interrogated: HL score 15. (down from 22) Multiple brief runs NSVT. Had one run SVT on 05/17/18. Discussed with EP Clinic. Marland KitchenPersonally reviewed    Echo 10/13/14 EF 20% Echo 12/16 EF 35 - 40%No LV clot Echo 11/30/15 EF 35% Echo  11/2016 EF 10-15%. Echo 04/2017 EF 15-20%  CMRI 05/20/2017  1. Mild to moderately dilated LV with EF 19%. Diffuse hypokinesis with septal-lateral dyssynchrony. There is a  small thinned area of the basal inferoseptal wall, there does not appear to be an actual VSD. No LV thrombus noted. 2.  Normal RV size with mildly decreased systolic function. 3. Noncoronary LGE pattern as noted above. This could be suggestive of prior myocarditis, less likely cardiac sarcoidosis.  Review of systems complete and found to be negative unless listed in HPI.   SH:  Social History   Socioeconomic History  . Marital status: Single    Spouse name: Not on file  . Number of children: 6  . Years of education: Not on file  . Highest education level: Not on file  Occupational History  . Occupation: Unemployed  Social Needs  . Financial resource strain: Not on file  . Food insecurity:    Worry: Not on file    Inability: Not on file  . Transportation needs:    Medical: Not on file    Non-medical: Not on file  Tobacco Use  . Smoking status: Former Smoker    Types: Cigarettes  . Smokeless tobacco: Former Neurosurgeon    Types: Chew  . Tobacco comment: Was a rare smoker  Substance and Sexual Activity  . Alcohol use: No    Alcohol/week: 0.0 standard drinks  . Drug use: No  . Sexual activity: Yes  Lifestyle  . Physical activity:    Days per week: Not on file  Minutes per session: Not on file  . Stress: Not on file  Relationships  . Social connections:    Talks on phone: Not on file    Gets together: Not on file    Attends religious service: Not on file    Active member of club or organization: Not on file    Attends meetings of clubs or organizations: Not on file    Relationship status: Not on file  . Intimate partner violence:    Fear of current or ex partner: Not on file    Emotionally abused: Not on file    Physically abused: Not on file    Forced sexual activity: Not on file  Other Topics Concern  . Not on file  Social History Narrative   Lives with mom.      FH:  Family History  Problem Relation Age of Onset  . Hypertension Mother   . Hypertension  Father   . Heart attack Father 68       died  . Hypertension Sister   . Heart attack Paternal Grandfather 50       died    Past Medical History:  Diagnosis Date  . AICD (automatic cardioverter/defibrillator) present 07/03/2017  . Anxiety   . CHF (congestive heart failure) (HCC)   . Essential hypertension   . History of alcohol abuse    a. Sober since Feb 2016.  . 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 NICM (nonischemic cardiomyopathy) (HCC)    a. 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    Current Outpatient Medications  Medication Sig Dispense Refill  . acetaminophen (TYLENOL) 325 MG tablet Take 1-2 tablets (325-650 mg total) by mouth every 4 (four) hours as needed for mild pain.    Marland Kitchen allopurinol (ZYLOPRIM) 300 MG tablet Take 1 tablet (300 mg total) by mouth daily. 30 tablet 6  . carvedilol (COREG) 12.5 MG tablet Take 1 tablet (12.5 mg total) by mouth 2 (two) times daily with a meal. 60 tablet 6  . digoxin (LANOXIN) 0.125 MG tablet Take 1 tablet (0.125 mg total) by mouth daily. 90 tablet 3  . folic acid (FOLVITE) 1 MG tablet TAKE 1 TABLET BY MOUTH DAILY 30 tablet 3  . furosemide (LASIX) 40 MG tablet TAKE 1 TABLET BY MOUTH DAILY 60 tablet 3  . hydrALAZINE (APRESOLINE) 50 MG tablet TAKE 1& 1/2 TABLET BY MOUTH THREE TIMES DAILY 153 tablet 0  . isosorbide mononitrate (IMDUR) 60 MG 24 hr tablet Take 1 tablet (60 mg total) by mouth daily. 30 tablet 6  . mexiletine (MEXITIL) 200 MG capsule Take 1 capsule (200 mg total) by mouth every 12 (twelve) hours. 60 capsule 6  . Multiple Vitamin (MULTIVITAMIN WITH MINERALS) TABS tablet Take 1 tablet by mouth daily. 30 tablet 0  . sacubitril-valsartan (ENTRESTO) 97-103 MG Take 1 tablet by mouth 2 (two) times daily. 180 tablet 3  .  spironolactone (ALDACTONE) 25 MG tablet TAKE 1 TABLET BY MOUTH ONCE DAILY 34 tablet 2  . venlafaxine XR (EFFEXOR XR) 37.5 MG 24 hr capsule Take 1 capsule (37.5 mg total) by mouth daily with breakfast. 30 capsule 11  . warfarin (COUMADIN) 7.5 MG tablet Take as directed by Coumadin Clinic 35 tablet 0   No current facility-administered medications for this encounter.  Vitals:   05/26/18 1004  BP: 126/74  Pulse: 78  SpO2: 98%  Weight: 84.9 kg (187 lb 4 oz)   Wt Readings from Last 3 Encounters:  05/26/18 84.9 kg (187 lb 4 oz)  01/21/18 90.6 kg (199 lb 12.8 oz)  12/30/17 88.9 kg (196 lb)    PHYSICAL EXAM: General:  Well appearing. No resp difficulty HEENT: normal Neck: supple. no JVD. Carotids 2+ bilat; no bruits. No lymphadenopathy or thryomegaly appreciated. Cor: PMI nondisplaced. Regular rate & rhythm. No rubs, gallops or murmurs. Lungs: clear Abdomen: soft, nontender, nondistended. No hepatosplenomegaly. No bruits or masses. Good bowel sounds. Extremities: no cyanosis, clubbing, rash, edema Neuro: alert & orientedx3, cranial nerves grossly intact. moves all 4 extremities w/o difficulty. Affect pleasant   ASSESSMENT & PLAN: 1. Chronic systolic HF - NICM Cath ok performed [place] 2016. 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 Echo 10/2016 EF 10-15%. LV dysfunction after stopping HF meds. - ECHO 04/16/2017 EF 15-20%.   - cMRI 2/19 EF 19%. RV mildly decreased .  - Now s/p [employer] Sci ICD 07/03/17. Had ICD shock with VT 07/08/17 - Now doing much better that he is back on meds x 1 month. NYHA I-II - I did bedside echo and EF 15-20%  - On great meds. Will see him back in 2 months with repeat echo. If EF still down consider CPX -  Volume status minimally elevated on ICD. Continue lasix 40 mg daily, with extra as needed. Will take one extra dose today.  - Continue Coreg 12.5 bid - Continue digoxin 0.125 mg daily.  - Continue hydralazine 75 mg TID - Continue Imdur to 60 mg daily - Continue entresto  97-103 mg twice a day  - Continue Spiro 25 mg daily.  - Reinforced fluid restriction to < 2 L daily, sodium restriction to less than 2000 mg daily, and the importance of daily weights.   2. LV mural thrombus  - Continue warfarin for anticoagulation. Check INR today. Can consider stopping AC if/when EF improvess - Denies bleeding.   3. H/o ETOH abuse quit 05/25/2014 was drinking 1/5 liquor daily.  - No longer drinking. No change.    4. VT 07/08/17  - He is s/p [employer] Sci ICD 07/03/17 - Continue mexiletine 200 mg BID. No change.  - ICD interrogated: Several episdoes NSVT> Had one run SVT for 3 min 30 secs on 05/17/18. Discussed with EP Clinic .Personally reviewed  5. HTN -Blood pressure well controlled, if not a bit low. Continue current regimen.  6. Gout - Continue allopurinol 300 daily.    Arvilla Meres, MD  10:37 AM

## 2018-05-26 NOTE — Patient Instructions (Signed)
No medication changes today!!!  Labs today We will only contact you if something comes back abnormal or we need to make some changes. Otherwise no news is good news!  Your physician has requested that you have an echocardiogram. Echocardiography is a painless test that uses sound waves to create images of your heart. It provides your doctor with information about the size and shape of your heart and how well your heart's chambers and valves are working. This procedure takes approximately one hour. There are no restrictions for this procedure.  Your physician recommends that you schedule a follow-up appointment in: 2 months with an ECHO

## 2018-06-02 ENCOUNTER — Ambulatory Visit (INDEPENDENT_AMBULATORY_CARE_PROVIDER_SITE_OTHER): Payer: Medicaid Other

## 2018-06-02 DIAGNOSIS — I5022 Chronic systolic (congestive) heart failure: Secondary | ICD-10-CM

## 2018-06-02 DIAGNOSIS — Z95810 Presence of automatic (implantable) cardiac defibrillator: Secondary | ICD-10-CM

## 2018-06-03 ENCOUNTER — Other Ambulatory Visit: Payer: Self-pay | Admitting: Physician Assistant

## 2018-06-03 NOTE — Progress Notes (Signed)
EPIC Encounter for ICM Monitoring  Patient Name: Robert Booth is a 48 y.o. male Date: 06/03/2018 Primary Care Physican: System, Pcp Not In Primary Cardiologist:Bensimhon Electrophysiologist:Taylor Last Weight:189lbs Today's Weight:183 lbs   Heart failure questions reviewed. He reported he feels fine at this time and denied any complaints. He had [facility] hospital ER visit for chest pain but is doing okay now.   HeartLogic Heart Failure Index0 and has returned to normal.   Prescribed:Furosemide 40 mgTake 1 tablet (40 mg total) by mouth daily.   LABS: 05/26/2018 Creatinine 1.27, BUN 17, Potassium 4.1, Sodium 137, GFR >60 01/21/2018 Creatinine 1.17, BUN 12, Potassium 4.1, Sodium 139, GFR >60  Recommendations: No changes    Follow-up plan: ICM clinic phone appointment on4/08/2018 and office visit with Dr Gala Romney 08/05/2018.         Copy of ICM check sent to Dr.Taylor and Dr Gala Romney.           Karie Soda, RN 06/03/2018 10:47 AM

## 2018-06-08 ENCOUNTER — Telehealth (HOSPITAL_COMMUNITY): Payer: Self-pay

## 2018-06-08 NOTE — Telephone Encounter (Signed)
Called and spoke to pt. Pt inr is being watched by pt's pcp. Pt states that his pcp is aware and has made adjustments to his coumadin and currently has a follow up with 3/26 with his pcp to recheck inr.

## 2018-06-17 ENCOUNTER — Other Ambulatory Visit: Payer: Self-pay | Admitting: Internal Medicine

## 2018-07-07 ENCOUNTER — Other Ambulatory Visit (HOSPITAL_COMMUNITY): Payer: Self-pay | Admitting: Internal Medicine

## 2018-07-13 ENCOUNTER — Other Ambulatory Visit: Payer: Self-pay

## 2018-07-13 ENCOUNTER — Ambulatory Visit (INDEPENDENT_AMBULATORY_CARE_PROVIDER_SITE_OTHER): Payer: Medicaid Other

## 2018-07-13 DIAGNOSIS — I5022 Chronic systolic (congestive) heart failure: Secondary | ICD-10-CM | POA: Diagnosis not present

## 2018-07-13 DIAGNOSIS — Z95810 Presence of automatic (implantable) cardiac defibrillator: Secondary | ICD-10-CM

## 2018-07-15 ENCOUNTER — Telehealth: Payer: Self-pay

## 2018-07-15 NOTE — Progress Notes (Signed)
Spoke with patient.  He advised he is doing well.  Weight is stable at 186-187 lbs and no fluid symptoms.  He no longer has [phone] (H) as a working number and only has a cell phone.  Will mail DPR to update with new cell number. He does have spotty reception at his home so sometimes the cell does not ring.  No changes today and next remote transmission 08/17/2018.

## 2018-07-15 NOTE — Telephone Encounter (Signed)
Remote ICM transmission received.  Attempted call to patient regarding ICM remote transmission and left message to return call   

## 2018-07-15 NOTE — Progress Notes (Signed)
Attempted return call as requested by voice mail message.  

## 2018-07-15 NOTE — Progress Notes (Signed)
EPIC Encounter for ICM Monitoring  Patient Name: Robert Booth is a 48 y.o. male Date: 07/15/2018 Primary Care Physican: System, Pcp Not In Primary Cardiologist:Bensimhon Electrophysiologist:Taylor Last Weight:183lbs 07/15/2018 Weight:unknown   Attempted call to patient and unable to reach.  Left message to return call. Transmission reviewed.   HeartLogic Heart Failure Index0.   Prescribed:Furosemide 40 mgTake 1 tablet (40 mg total) by mouth daily.   LABS: 05/26/2018 Creatinine 1.27, BUN 17, Potassium 4.1, Sodium 137, GFR >60 01/21/2018 Creatinine 1.17, BUN 12, Potassium 4.1, Sodium 139, GFR >60  Recommendations: Unable to reach.   Follow-up plan: ICM clinic phone appointment on 5/18/2020and office visit with Dr Gala Romney 08/05/2018.  Copy of ICM check sent to Dr.Taylor.     Karie Soda, RN 07/15/2018 11:29 AM

## 2018-07-23 ENCOUNTER — Other Ambulatory Visit: Payer: Self-pay

## 2018-07-23 ENCOUNTER — Ambulatory Visit (INDEPENDENT_AMBULATORY_CARE_PROVIDER_SITE_OTHER): Payer: Medicaid Other | Admitting: *Deleted

## 2018-07-23 DIAGNOSIS — I472 Ventricular tachycardia, unspecified: Secondary | ICD-10-CM

## 2018-07-23 DIAGNOSIS — I428 Other cardiomyopathies: Secondary | ICD-10-CM

## 2018-07-25 LAB — CUP PACEART REMOTE DEVICE CHECK
Date Time Interrogation Session: 20200425000857
Implantable Lead Implant Date: 20190404
Implantable Lead Location: 753860
Implantable Lead Model: 292
Implantable Lead Serial Number: 446269
Implantable Pulse Generator Implant Date: 20190404
Pulse Gen Serial Number: 244129

## 2018-07-30 ENCOUNTER — Telehealth: Payer: Self-pay | Admitting: Internal Medicine

## 2018-07-30 NOTE — Telephone Encounter (Signed)
Attempted to call patient. Unable to leave message  Gypsy Balsam, NP 07/30/2018 12:14 PM

## 2018-07-30 NOTE — Telephone Encounter (Signed)
° ° ° °  1. Has your device fired? NO  2. Is you device beeping? NO 3. Are you experiencing draining or swelling at device site? NO  4. Are you calling to see if we received your device transmission? Patient has questions about 4/24 transmission  5. Have you passed out? no    Please route to Device Clinic Pool

## 2018-07-31 ENCOUNTER — Encounter: Payer: Self-pay | Admitting: Cardiology

## 2018-07-31 NOTE — Telephone Encounter (Signed)
Pt returning call

## 2018-07-31 NOTE — Telephone Encounter (Signed)
Spoke with patient, remote results reviewed,  Gypsy Balsam, NP 07/31/2018 9:39 AM

## 2018-07-31 NOTE — Progress Notes (Signed)
Remote ICD transmission.   

## 2018-07-31 NOTE — Telephone Encounter (Signed)
Follow up    Patient is returning call his best contact number is [phone]

## 2018-08-05 ENCOUNTER — Other Ambulatory Visit (HOSPITAL_COMMUNITY): Payer: Medicaid Other

## 2018-08-05 ENCOUNTER — Encounter (HOSPITAL_COMMUNITY): Payer: Medicaid Other | Admitting: Internal Medicine

## 2018-08-06 ENCOUNTER — Encounter (HOSPITAL_COMMUNITY): Payer: Self-pay | Admitting: Cardiology

## 2018-08-06 NOTE — Progress Notes (Signed)
Letter written to accompany S[employer] application

## 2018-08-17 ENCOUNTER — Other Ambulatory Visit: Payer: Self-pay

## 2018-08-17 ENCOUNTER — Ambulatory Visit (INDEPENDENT_AMBULATORY_CARE_PROVIDER_SITE_OTHER): Payer: Medicaid Other

## 2018-08-17 DIAGNOSIS — Z95810 Presence of automatic (implantable) cardiac defibrillator: Secondary | ICD-10-CM

## 2018-08-17 DIAGNOSIS — I5022 Chronic systolic (congestive) heart failure: Secondary | ICD-10-CM | POA: Diagnosis not present

## 2018-08-18 ENCOUNTER — Telehealth: Payer: Self-pay

## 2018-08-18 NOTE — Telephone Encounter (Signed)
Spoke w/ pt and informed him that we have not received the transmission. Instructed pt how to send transmission. Transmission received.

## 2018-08-18 NOTE — Telephone Encounter (Signed)
Left message for patient to remind of missed remote transmission.  

## 2018-08-18 NOTE — Telephone Encounter (Signed)
New Message    Pt says he is not sure why the transmission didn't go through yesterday. But he sent another today

## 2018-08-21 NOTE — Progress Notes (Signed)
EPIC Encounter for ICM Monitoring  Patient Name: Robert Booth is a 48 y.o. male Date: 08/21/2018 Primary Care Physican: System, Pcp Not In Primary Cardiologist:Bensimhon Electrophysiologist:Taylor Last Weight:183lbs   Transmission reviewed and results sent via mychart.   HeartLogic Heart Failure Index0.  Prescribed:Furosemide 40 mgTake 1 tablet (40 mg total) by mouth daily.   LABS: 05/26/2018 Creatinine 1.27, BUN 17, Potassium 4.1, Sodium 137, GFR >60 01/21/2018 Creatinine 1.17, BUN 12, Potassium 4.1, Sodium 139, GFR >60  Recommendations: None.  Follow-up plan: ICM clinic phone appointment on 09/21/2018.  Copy of ICM check sent to Dr.Taylor.     8 Day Data Trend            Karie Soda, RN 08/21/2018 11:47 AM

## 2018-09-02 ENCOUNTER — Other Ambulatory Visit: Payer: Self-pay | Admitting: Physician Assistant

## 2018-09-02 NOTE — Telephone Encounter (Signed)
Pt's medication was sent to pt's pharmacy as requested. Confirmation received.  °

## 2018-09-21 ENCOUNTER — Ambulatory Visit (INDEPENDENT_AMBULATORY_CARE_PROVIDER_SITE_OTHER): Payer: Medicaid Other

## 2018-09-21 DIAGNOSIS — I5022 Chronic systolic (congestive) heart failure: Secondary | ICD-10-CM

## 2018-09-21 DIAGNOSIS — Z95810 Presence of automatic (implantable) cardiac defibrillator: Secondary | ICD-10-CM

## 2018-09-25 ENCOUNTER — Telehealth: Payer: Self-pay

## 2018-09-25 NOTE — Progress Notes (Signed)
EPIC Encounter for ICM Monitoring  Patient Name: Robert Booth is a 48 y.o. male Date: 09/25/2018 Primary Care Physican: System, Pcp Not In Primary Cardiologist:Bensimhon Electrophysiologist:Taylor Last Weight:183lbs   Attempted call to patient and unable to reach.  Transmission reviewed and results sent via mychart.   HeartLogic Heart Failure Indexis 11 within normal threshold range.  Prescribed:Furosemide 40 mgTake 1 tablet (40 mg total) by mouth daily.   LABS: 05/26/2018 Creatinine 1.27, BUN 17, Potassium 4.1, Sodium 137, GFR >60 01/21/2018 Creatinine 1.17, BUN 12, Potassium 4.1, Sodium 139, GFR >60  Recommendations: None  Follow-up plan: ICM clinic phone appointment on7/27/2020.  Copy of ICM check sent to Dr.Taylor.  *   8 Day Data Trend            Rosalene Billings, RN 09/25/2018 9:33 AM

## 2018-09-25 NOTE — Telephone Encounter (Signed)
Remote ICM transmission received.  Attempted call to patient regarding ICM remote transmission and number is not receiving calls at this time.

## 2018-09-28 ENCOUNTER — Other Ambulatory Visit (HOSPITAL_COMMUNITY): Payer: Self-pay | Admitting: Internal Medicine

## 2018-10-15 ENCOUNTER — Telehealth (HOSPITAL_COMMUNITY): Payer: Self-pay

## 2018-10-15 NOTE — Telephone Encounter (Signed)
Doesn't look like he is taking coumadin. Lets repeat echo and see if he still has LV thrombus. If so we will need to get him back on coumadin and figure out who will manage.

## 2018-10-15 NOTE — Telephone Encounter (Addendum)
Received lab fax from pt's pcp giving the pt's PT/INR. Called the patient's PCP to verify that they are managing his coumadin. The pcp denied managing the pt's coumadin stating that they thought that we were. They state that they have been drawing his inr and sending it to [place], however this is the only faxed PT/INR since March 2020. This RN questioned as to who is refilling the medication and the PCP said that they were refilling it however they were not making any medication changes because they thought we were managing his coumadin. Called the patient (both numbers in the chart) and neither of them were working numbers. Called the PCP back to see if the pt had any other number for the patient. They did and I called the pt on that number. No one answered, left voicemail. Left voicemail on [phone].  PT: 10.5 seconds INR: 0.90  Dated 10/06/18

## 2018-10-22 ENCOUNTER — Other Ambulatory Visit (HOSPITAL_COMMUNITY): Payer: Medicaid Other

## 2018-10-22 ENCOUNTER — Encounter (HOSPITAL_COMMUNITY): Payer: Medicaid Other | Admitting: Internal Medicine

## 2018-10-27 ENCOUNTER — Encounter: Payer: Medicaid Other | Admitting: *Deleted

## 2018-10-27 ENCOUNTER — Telehealth: Payer: Self-pay

## 2018-10-27 NOTE — Telephone Encounter (Signed)
Unable to speak  with patient to remind of missed remote transmission 

## 2018-10-28 ENCOUNTER — Telehealth: Payer: Self-pay

## 2018-10-28 NOTE — Telephone Encounter (Signed)
Left message for patient to remind of missed remote transmission.  

## 2018-11-04 ENCOUNTER — Ambulatory Visit (INDEPENDENT_AMBULATORY_CARE_PROVIDER_SITE_OTHER): Payer: Medicaid Other | Admitting: *Deleted

## 2018-11-04 DIAGNOSIS — I428 Other cardiomyopathies: Secondary | ICD-10-CM

## 2018-11-04 LAB — CUP PACEART REMOTE DEVICE CHECK
Date Time Interrogation Session: 20200805141928
Implantable Lead Implant Date: 20190404
Implantable Lead Location: 753860
Implantable Lead Model: 292
Implantable Lead Serial Number: 446269
Implantable Pulse Generator Implant Date: 20190404
Pulse Gen Serial Number: 244129

## 2018-11-06 ENCOUNTER — Ambulatory Visit (INDEPENDENT_AMBULATORY_CARE_PROVIDER_SITE_OTHER): Payer: Medicaid Other

## 2018-11-06 ENCOUNTER — Telehealth: Payer: Self-pay

## 2018-11-06 DIAGNOSIS — I5022 Chronic systolic (congestive) heart failure: Secondary | ICD-10-CM | POA: Diagnosis not present

## 2018-11-06 DIAGNOSIS — Z95810 Presence of automatic (implantable) cardiac defibrillator: Secondary | ICD-10-CM

## 2018-11-06 NOTE — Progress Notes (Signed)
EPIC Encounter for ICM Monitoring  Patient Name: Robert Booth is a 48 y.o. male Date: 11/06/2018 Primary Care Physican: System, Pcp Not In Primary Cardiologist:Bensimhon Electrophysiologist:Taylor Last Weight:183lbs   Attempted call to patient and unable to reach.  Transmission reviewedand results sent via mychart.   HeartLogic Heart Failure Indexis 27 suggestive of possible ongoing fluid accumulation. Crossed alert threshold of 16 on 10/24/2018.  Prescribed:Furosemide 40 mgTake 1 tablet (40 mg total) by mouth daily.   LABS: 05/26/2018 Creatinine 1.27, BUN 17, Potassium 4.1, Sodium 137, GFR >60 01/21/2018 Creatinine 1.17, BUN 12, Potassium 4.1, Sodium 139, GFR >60  Recommendations: Message on mychart requesting patient call to discuss remote transmission.  Follow-up plan: ICM clinic phone appointment on8/17/2020 to recheck fluid levels and will continue to monitor for alerts.  Copy of ICM check sent to Dr.Taylor and Dr Haroldine Laws     8 Day Data Trend           Rosalene Billings, RN 11/06/2018 10:13 AM

## 2018-11-06 NOTE — Telephone Encounter (Signed)
Remote ICM transmission received.  Attempted call to patient at [phone] and recording states not able to receive calls at this time and also [phone] and recording said there are calling restrictions.  Unable to leave message.

## 2018-11-10 ENCOUNTER — Ambulatory Visit (INDEPENDENT_AMBULATORY_CARE_PROVIDER_SITE_OTHER): Payer: Medicaid Other

## 2018-11-10 DIAGNOSIS — Z95810 Presence of automatic (implantable) cardiac defibrillator: Secondary | ICD-10-CM

## 2018-11-10 DIAGNOSIS — I5022 Chronic systolic (congestive) heart failure: Secondary | ICD-10-CM

## 2018-11-10 NOTE — Progress Notes (Signed)
EPIC Encounter for ICM Monitoring  Patient Name: Robert Booth is a 48 y.o. male Date: 11/10/2018 Primary Care Physican: System, Pcp Not In Primary Cardiologist:Bensimhon Electrophysiologist:Taylor Last Weight:183lbs   Attempted call to patient and unable to reach..   HeartLogic Heart Failure Indexdecreased to 7 (from 27) suggesting improvement in fluid levels. Crossed alert threshold of 16 on 10/24/2018.  Prescribed:Furosemide 40 mgTake 1 tablet (40 mg total) by mouth daily.   LABS: 05/26/2018 Creatinine 1.27, BUN 17, Potassium 4.1, Sodium 137, GFR >60 01/21/2018 Creatinine 1.17, BUN 12, Potassium 4.1, Sodium 139, GFR >60  Recommendations: Unable to reach.  Follow-up plan: ICM clinic phone appointment on9/10/2018 and will continue to monitor for alerts.  Copy of ICM check sent to Dr.Taylor..     8 Day Data Trend            Rosalene Billings, RN 11/10/2018 8:26 AM

## 2018-11-10 NOTE — Progress Notes (Signed)
Remote ICD transmission.   

## 2018-11-11 ENCOUNTER — Telehealth: Payer: Self-pay

## 2018-11-11 NOTE — Telephone Encounter (Signed)
Attempted both phone numbers listed and calls are restricted.  Unable to leave a message regarding ICM remote transmission.

## 2018-12-11 NOTE — Progress Notes (Signed)
No ICM remote transmission received for 12/08/2018 and next ICM transmission scheduled for 01/25/2019.   

## 2018-12-24 ENCOUNTER — Other Ambulatory Visit (HOSPITAL_COMMUNITY): Payer: Medicaid Other

## 2018-12-24 ENCOUNTER — Encounter (HOSPITAL_COMMUNITY): Payer: Medicaid Other | Admitting: Internal Medicine

## 2019-01-22 ENCOUNTER — Ambulatory Visit (HOSPITAL_COMMUNITY)
Admission: RE | Admit: 2019-01-22 | Discharge: 2019-01-22 | Disposition: A | Discharge status: Home / Self Care | Payer: Medicaid Other | Source: Ambulatory Visit | Attending: Internal Medicine | Admitting: Internal Medicine

## 2019-01-22 ENCOUNTER — Other Ambulatory Visit: Payer: Self-pay

## 2019-01-22 ENCOUNTER — Encounter (HOSPITAL_COMMUNITY): Payer: Self-pay | Admitting: Internal Medicine

## 2019-01-22 ENCOUNTER — Ambulatory Visit (HOSPITAL_BASED_OUTPATIENT_CLINIC_OR_DEPARTMENT_OTHER)
Admission: RE | Admit: 2019-01-22 | Discharge: 2019-01-22 | Disposition: A | Discharge status: Home / Self Care | Payer: Medicaid Other | Source: Ambulatory Visit | Attending: Internal Medicine | Admitting: Internal Medicine

## 2019-01-22 ENCOUNTER — Encounter (HOSPITAL_COMMUNITY): Payer: Self-pay

## 2019-01-22 VITALS — BP 102/80 | HR 92 | Wt 193.6 lb

## 2019-01-22 DIAGNOSIS — M1000 Idiopathic gout, unspecified site: Secondary | ICD-10-CM | POA: Insufficient documentation

## 2019-01-22 DIAGNOSIS — I472 Ventricular tachycardia, unspecified: Secondary | ICD-10-CM

## 2019-01-22 DIAGNOSIS — Z95810 Presence of automatic (implantable) cardiac defibrillator: Secondary | ICD-10-CM | POA: Insufficient documentation

## 2019-01-22 DIAGNOSIS — Z87891 Personal history of nicotine dependence: Secondary | ICD-10-CM | POA: Insufficient documentation

## 2019-01-22 DIAGNOSIS — I428 Other cardiomyopathies: Secondary | ICD-10-CM | POA: Diagnosis not present

## 2019-01-22 DIAGNOSIS — Z7901 Long term (current) use of anticoagulants: Secondary | ICD-10-CM | POA: Insufficient documentation

## 2019-01-22 DIAGNOSIS — I5022 Chronic systolic (congestive) heart failure: Secondary | ICD-10-CM | POA: Diagnosis not present

## 2019-01-22 DIAGNOSIS — Z79899 Other long term (current) drug therapy: Secondary | ICD-10-CM | POA: Insufficient documentation

## 2019-01-22 DIAGNOSIS — I130 Hypertensive heart and chronic kidney disease with heart failure and stage 1 through stage 4 chronic kidney disease, or unspecified chronic kidney disease: Secondary | ICD-10-CM | POA: Insufficient documentation

## 2019-01-22 DIAGNOSIS — Z8249 Family history of ischemic heart disease and other diseases of the circulatory system: Secondary | ICD-10-CM | POA: Insufficient documentation

## 2019-01-22 DIAGNOSIS — I083 Combined rheumatic disorders of mitral, aortic and tricuspid valves: Secondary | ICD-10-CM | POA: Insufficient documentation

## 2019-01-22 DIAGNOSIS — N182 Chronic kidney disease, stage 2 (mild): Secondary | ICD-10-CM | POA: Diagnosis not present

## 2019-01-22 DIAGNOSIS — I272 Pulmonary hypertension, unspecified: Secondary | ICD-10-CM | POA: Insufficient documentation

## 2019-01-22 LAB — BASIC METABOLIC PANEL
Anion gap: 12 (ref 5–15)
BUN: 17 mg/dL (ref 6–20)
CO2: 25 mmol/L (ref 22–32)
Calcium: 9.2 mg/dL (ref 8.9–10.3)
Chloride: 102 mmol/L (ref 98–111)
Creatinine, Ser: 1.37 mg/dL — ABNORMAL HIGH (ref 0.61–1.24)
GFR calc Af Amer: >60 mL/min (ref >60)
GFR calc non Af Amer: >60 mL/min (ref >60)
Glucose, Bld: 95 mg/dL (ref 70–99)
Potassium: 3.8 mmol/L (ref 3.5–5.1)
Sodium: 139 mmol/L (ref 135–145)

## 2019-01-22 LAB — BRAIN NATRIURETIC PEPTIDE: B Natriuretic Peptide: 443.2 pg/mL — ABNORMAL HIGH (ref 0.0–100.0)

## 2019-01-22 LAB — URIC ACID: Uric Acid, Serum: 6.2 mg/dL (ref 3.7–8.6)

## 2019-01-22 MED ORDER — PERFLUTREN LIPID MICROSPHERE
1.0000 mL | INTRAVENOUS | Status: DC | PRN
Start: 2019-01-22 — End: 2019-01-22
  Administered 2019-01-22: 12:00:00 2 mL via INTRAVENOUS
  Filled 2019-01-22: qty 10

## 2019-01-22 MED ORDER — DAPAGLIFLOZIN PROPANEDIOL 10 MG PO TABS: 10.0000 mg | ORAL_TABLET | Freq: Every day | ORAL | 5 refills | Status: DC

## 2019-01-22 NOTE — Progress Notes (Signed)
Patient ID: Robert Booth, male   DOB: [birth year], 48 y.o.   MRN: [id number] \  Advanced Heart Failure Clinic Note   PCP: [facility]  Primary HF Cardiologist: Dr Gala Romney  HPI: Robert Booth is a 48 y.o. male with hx of systolic HF with NICM secondary to prolonged ETOH abuse, EF 15% 2018, and CKD stage 2.  In 8/16, he presented to [facility] with hemoptysis. Found to have RLL PNA and cardiogenic shock. Treated briefly with milrinone and levophed. Diuresed well. Discharge weight 177 .   Hospitalized 9/2 through 12/05/2016 with recurrent A/C systolic heart failure in the setting of medication noncompliance. He had been oout of medications for 4 months. Echo with EF 10-15%   Diuresed with IV lasix.  HF meds restarted. D/c weight 177 lbs  ECHO 04/2017 EF ~20%. RV moderately dilated. Pt underwent ICD placement 07/03/17 - [employer].  Admitted to ED 07/08/17 with ICD shock. Had VT with ATP x 2 and shock x 1. Electrolytes stable. No clear inciting cause.  EF improved to 35-40% then stopped taking meds and EF back down   In 2/20 presented to OSH with CP. Troponins negative. Echo EF 15-20%   He presents today for HF follow up. Says he is feeling ok. Has been struggling with gout in his ankles and knees, Recently on prednisone. Weigh up slightly. Activity limited due to gout. Denies SOB with activity. No orthopnea or PND. Taking all meds as prescribed.   Echo today EF 15% RV moderately HK No LV thrombus Personally reviewed  ICD interrogated: HL score 9. Fluid increasing. Multiple brief runs NSVT no shocks. Personally reviewed     Echo 10/13/14 EF 20% Echo 12/16 EF 35 - 40%No LV clot Echo 11/30/15 EF 35% Echo  11/2016 EF 10-15%. Echo 04/2017 EF 15-20%  CMRI 05/20/2017  1. Mild to moderately dilated LV with EF 19%. Diffuse hypokinesis with septal-lateral dyssynchrony. There is a small thinned area of the basal inferoseptal wall, there does not appear to be an actual VSD. No LV  thrombus noted. 2.  Normal RV size with mildly decreased systolic function. 3. Noncoronary LGE pattern as noted above. This could be suggestive of prior myocarditis, less likely cardiac sarcoidosis.  Review of systems complete and found to be negative unless listed in HPI.   SH:  Social History   Socioeconomic History  . Marital status: Single    Spouse name: Not on file  . Number of children: 6  . Years of education: Not on file  . Highest education level: Not on file  Occupational History  . Occupation: Unemployed  Social Needs  . Financial resource strain: Not on file  . Food insecurity    Worry: Not on file    Inability: Not on file  . Transportation needs    Medical: Not on file    Non-medical: Not on file  Tobacco Use  . Smoking status: Former Smoker    Types: Cigarettes  . Smokeless tobacco: Former Neurosurgeon    Types: Chew  . Tobacco comment: Was a rare smoker  Substance and Sexual Activity  . Alcohol use: No    Alcohol/week: 0.0 standard drinks  . Drug use: No  . Sexual activity: Yes  Lifestyle  . Physical activity    Days per week: Not on file    Minutes per session: Not on file  . Stress: Not on file  Relationships  . Social connections    Talks on phone: Not on file  Gets together: Not on file    Attends religious service: Not on file    Active member of club or organization: Not on file    Attends meetings of clubs or organizations: Not on file    Relationship status: Not on file  . Intimate partner violence    Fear of current or ex partner: Not on file    Emotionally abused: Not on file    Physically abused: Not on file    Forced sexual activity: Not on file  Other Topics Concern  . Not on file  Social History Narrative   Lives with mom.      FH:  Family History  Problem Relation Age of Onset  . Hypertension Mother   . Hypertension Father   . Heart attack Father 52       died  . Hypertension Sister   . Heart attack Paternal Grandfather  50       died    Past Medical History:  Diagnosis Date  . AICD (automatic cardioverter/defibrillator) present 07/03/2017  . Anxiety   . CHF (congestive heart failure) (HCC)   . Essential hypertension   . History of alcohol abuse    a. Sober since Feb 2016.  . 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 NICM (nonischemic cardiomyopathy) (HCC)    a. 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    Current Outpatient Medications  Medication Sig Dispense Refill  . acetaminophen (TYLENOL) 325 MG tablet Take 1-2 tablets (325-650 mg total) by mouth every 4 (four) hours as needed for mild pain.    Marland Kitchen allopurinol (ZYLOPRIM) 300 MG tablet Take 1 tablet (300 mg total) by mouth daily. 30 tablet 6  . carvedilol (COREG) 12.5 MG tablet Take 1 tablet (12.5 mg total) by mouth 2 (two) times daily with a meal. 60 tablet 6  . digoxin (LANOXIN) 0.125 MG tablet Take 1 tablet (0.125 mg total) by mouth daily. 90 tablet 3  . folic acid (FOLVITE) 1 MG tablet TAKE 1 TABLET BY MOUTH DAILY 30 tablet 3  . furosemide (LASIX) 40 MG tablet TAKE 1 TABLET BY MOUTH DAILY 60 tablet 3  . hydrALAZINE (APRESOLINE) 50 MG tablet TAKE 1& 1/2 TABLET BY MOUTH THREE TIMES DAILY 153 tablet 0  . isosorbide mononitrate (IMDUR) 60 MG 24 hr tablet Take 1 tablet (60 mg total) by mouth daily. 30 tablet 6  . mexiletine (MEXITIL) 200 MG capsule Take 1 capsule (200 mg total) by mouth every 12 (twelve) hours. Please make yearly appt with Dr. Ladona Ridgel for October for future refills. 1st attempt 60 capsule 4  . Multiple Vitamin (MULTIVITAMIN WITH MINERALS) TABS tablet Take 1 tablet by mouth daily. 30 tablet 0  . sacubitril-valsartan (ENTRESTO) 97-103 MG Take 1 tablet by mouth 2 (two) times daily. 180 tablet 3  . spironolactone (ALDACTONE) 25 MG  tablet TAKE 1 TABLET BY MOUTH ONCE DAILY 34 tablet 2  . venlafaxine XR (EFFEXOR XR) 37.5 MG 24 hr capsule Take 1 capsule (37.5 mg total) by mouth daily with breakfast. 30 capsule 11  . warfarin (COUMADIN) 7.5 MG tablet Take as directed by Coumadin Clinic 35 tablet 0   No current facility-administered medications for this encounter.    Facility-Administered Medications Ordered in Other Encounters  Medication Dose Route Frequency Provider Last Rate Last  Dose  . perflutren lipid microspheres (DEFINITY) IV suspension  1-10 mL Intravenous PRN Filomena Pokorney, Shaune Pascal, MD       Vitals:   01/22/19 1150  BP: 102/80  Pulse: 92  SpO2: 97%  Weight: 87.8 kg (193 lb 9.6 oz)   Wt Readings from Last 3 Encounters:  05/26/18 84.9 kg (187 lb 4 oz)  01/21/18 90.6 kg (199 lb 12.8 oz)  12/30/17 88.9 kg (196 lb)    PHYSICAL EXAM: General:  Well appearing. No resp difficulty HEENT: normal Neck: supple. no JVD. Carotids 2+ bilat; no bruits. No lymphadenopathy or thryomegaly appreciated. Cor: PMI nondisplaced. Regular rate & rhythm. No rubs, gallops or murmurs. Lungs: clear Abdomen: soft, nontender, nondistended. No hepatosplenomegaly. No bruits or masses. Good bowel sounds. Extremities: no cyanosis, clubbing, rash, edema Neuro: alert & orientedx3, cranial nerves grossly intact. moves all 4 extremities w/o difficulty. Affect pleasant   ASSESSMENT & PLAN:  1. Chronic systolic HF - NICM Cath ok performed [place] 2016. 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 Echo 10/2016 EF 10-15%. LV dysfunction after stopping HF meds. - ECHO 04/16/2017 EF 15-20%.   - cMRI 2/19 EF 19%. RV mildly decreased .  - Now s/p Bost Sci ICD 07/03/17. Had ICD shock with VT 07/08/17 - Echo today EF 15% with moderate RV dysfunction - NYHA II-III - Volume status minimally elevated on ICD. Continue lasix 40 mg daily, with extra as needed.  - Continue Coreg 12.5 bid - Continue digoxin 0.125 mg daily.  - Continue hydralazine 75 mg TID - Continue  Imdur to 60 mg daily - Continue entresto 97-103 mg twice a day  - Continue Spiro 25 mg daily.  - Add Faxiga 10 - Plan CPX to further risk stratify for advanced therapies - Reinforced fluid restriction to < 2 L daily, sodium restriction to less than 2000 mg daily, and the importance of daily weights.  - Labs today  2. LV mural thrombus  - Continue warfarin for anticoagulation. No bleeding - No thrombus on echo today.   3. H/o ETOH abuse quit 05/25/2014 was drinking 1/5 liquor daily.  - No longer drinking. No change.    4. VT 07/08/17  - He is s/p [employer] Sci ICD 07/03/17 - Continue mexiletine 200 mg BID. No change.  - ICD interrogated in person: Volume slightly up. + NSVT No therapies delivered Personally reviewed  5. Gout - Continue allopurinol 300 daily.  - Will refer to Dr. Amil Amen in Rheum    Glori Bickers, MD  11:36 AM

## 2019-01-22 NOTE — Patient Instructions (Addendum)
START Farxiga 10mg  (1 tab) daily  Your physician has recommended that you have a cardiopulmonary stress test (CPX). CPX testing is a non-invasive measurement of heart and lung function. It replaces a traditional treadmill stress test. This type of test provides a tremendous amount of information that relates not only to your present condition but also for future outcomes. This test combines measurements of you ventilation, respiratory gas exchange in the lungs, electrocardiogram (EKG), blood pressure and physical response before, during, and following an exercise protocol.  Please have covid test 3-4 days before your CPX test.  After this test, you have to self quarantine until the day of your test. Please have your doctors office fax [place] the results of your covid screen before the day of your test.  If our office does not receive results, your test can be cancelled/postponed.  Joylene Igo [phone]  You have been referred to Dr Amil Amen of Rheumatology for your gout.  His office will give you a call for scheduling.  Your physician recommends that you schedule a follow-up appointment in: 2 months with Dr Haroldine Laws  At the [facility], you and your health needs are our priority. As part of our continuing mission to provide you with exceptional heart care, we have created designated Provider Care Teams. These Care Teams include your primary Cardiologist (physician) and Advanced Practice Providers (APPs- Physician Assistants and Nurse Practitioners) who all work together to provide you with the care you need, when you need it.   You may see any of the following providers on your designated Care Team at your next follow up: Marland Kitchen Dr Glori Bickers . Dr Loralie Champagne . Darrick Grinder, NP . Lyda Jester, PA   Please be sure to bring in all your medications bottles to every appointment.

## 2019-01-25 ENCOUNTER — Ambulatory Visit (INDEPENDENT_AMBULATORY_CARE_PROVIDER_SITE_OTHER): Payer: Medicaid Other

## 2019-01-25 ENCOUNTER — Other Ambulatory Visit (HOSPITAL_COMMUNITY): Payer: Self-pay

## 2019-01-25 ENCOUNTER — Telehealth (HOSPITAL_COMMUNITY): Payer: Self-pay | Admitting: Pharmacy Technician

## 2019-01-25 DIAGNOSIS — Z95810 Presence of automatic (implantable) cardiac defibrillator: Secondary | ICD-10-CM

## 2019-01-25 DIAGNOSIS — I5022 Chronic systolic (congestive) heart failure: Secondary | ICD-10-CM

## 2019-01-25 MED ORDER — DAPAGLIFLOZIN PROPANEDIOL 10 MG PO TABS: 10.0000 mg | ORAL_TABLET | Freq: Every day | ORAL | 5 refills | Status: DC

## 2019-01-25 NOTE — Telephone Encounter (Signed)
Patient Advocate Encounter   Received notification from Medicaid that prior authorization for Farxiga 10mg  is required.   PA submitted on NCTracks. Key [id number] W Status is pending   Will continue to follow.  Charlann Boxer, CPhT

## 2019-01-26 ENCOUNTER — Telehealth: Payer: Self-pay | Admitting: Internal Medicine

## 2019-01-26 NOTE — Telephone Encounter (Signed)
[facility] in [place], [place] is requesting a alternative for mexiletine  200 mg tablet, because pharmacy is unable to obtain it from the wholesaler. Please address

## 2019-01-27 MED ORDER — MEXILETINE HCL 200 MG PO CAPS: 200.0000 mg | ORAL_CAPSULE | Freq: Two times a day (BID) | ORAL | 0 refills | Status: DC

## 2019-01-27 NOTE — Progress Notes (Signed)
EPIC Encounter for ICM Monitoring  Patient Name: Robert Booth is a 48 y.o. male Date: 01/27/2019 Primary Care Physican: System, Pcp Not In Primary Cardiologist:Bensimhon Electrophysiologist:Taylor Last Weight:183lbs   Transmission reviewed.  HeartLogic Heart Failure Indexdecreased to 4 (from 27) suggesting within normal threshold  Prescribed:Furosemide 40 mgTake 1 tablet (40 mg total) by mouth daily and can take extra as needed per Dr Bensimhon's 01/22/2019 OV note.   LABS: 01/22/2019 Creatinine 1.37, BUN 17, Potassium 3.8, Sodium 139, GFR >60 05/26/2018 Creatinine 1.27, BUN 17, Potassium 4.1, Sodium 137, GFR >60  Recommendations: None  Follow-up plan: ICM clinic phone appointment on 03/03/2019.   91 day device clinic remote transmission 02/03/2019.          Copy of ICM check sent to Dr. Lovena Le.   Rosalene Billings, RN 01/27/2019 3:27 PM

## 2019-01-29 NOTE — Telephone Encounter (Signed)
Advanced Heart Failure Patient Advocate Encounter  Prior Authorization for [place] has been approved.    PA# [id number] Effective dates: 01/25/2019 through 01/25/2019  Patients co-pay is $3.00  Charlann Boxer, CPhT

## 2019-02-01 ENCOUNTER — Other Ambulatory Visit (HOSPITAL_COMMUNITY): Payer: Self-pay

## 2019-02-01 MED ORDER — ALLOPURINOL 300 MG PO TABS: 300.0000 mg | ORAL_TABLET | Freq: Every day | ORAL | 6 refills | Status: DC

## 2019-02-03 ENCOUNTER — Ambulatory Visit: Payer: Medicaid Other | Admitting: *Deleted

## 2019-02-09 LAB — CUP PACEART REMOTE DEVICE CHECK
Date Time Interrogation Session: 20201110082426
Implantable Lead Implant Date: 20190404
Implantable Lead Location: 753860
Implantable Lead Model: 292
Implantable Lead Serial Number: 446269
Implantable Pulse Generator Implant Date: 20190404
Pulse Gen Serial Number: 244129

## 2019-02-17 ENCOUNTER — Other Ambulatory Visit: Payer: Self-pay | Admitting: Internal Medicine

## 2019-02-18 ENCOUNTER — Ambulatory Visit (HOSPITAL_COMMUNITY): Payer: Medicaid Other | Attending: Internal Medicine

## 2019-02-18 ENCOUNTER — Other Ambulatory Visit: Payer: Self-pay

## 2019-02-18 DIAGNOSIS — I5022 Chronic systolic (congestive) heart failure: Secondary | ICD-10-CM | POA: Insufficient documentation

## 2019-03-03 ENCOUNTER — Ambulatory Visit (INDEPENDENT_AMBULATORY_CARE_PROVIDER_SITE_OTHER): Payer: Medicaid Other

## 2019-03-03 DIAGNOSIS — Z95810 Presence of automatic (implantable) cardiac defibrillator: Secondary | ICD-10-CM | POA: Diagnosis not present

## 2019-03-03 DIAGNOSIS — I5022 Chronic systolic (congestive) heart failure: Secondary | ICD-10-CM

## 2019-03-05 NOTE — Progress Notes (Signed)
EPIC Encounter for ICM Monitoring  Patient Name: Verland Sprinkle is a 48 y.o. male Date: 03/05/2019 Primary Care Physican: System, Pcp Not In Primary Cardiologist:Bensimhon Electrophysiologist:Taylor 03/05/2019 Weight:196lbs  Spoke with patient and he is doing well.  02/28/2019 HeartLogic Heart Failure Index0 suggesting within normal threshold  Prescribed:Furosemide 40 mgTake 1 tablet (40 mg total) by mouth daily and can take extra as needed per Dr Bensimhon's 01/22/2019 OV note.   LABS: 01/22/2019 Creatinine 1.37, BUN 17, Potassium 3.8, Sodium 139, GFR >60 05/26/2018 Creatinine 1.27, BUN 17, Potassium 4.1, Sodium 137, GFR >60  Recommendations:  Encouraged to call if experiencing fluid symptoms.  Follow-up plan: ICM clinic phone appointment on 04/05/2019.   91 day device clinic remote transmission 05/05/2019.  Office appt 04/09/2019 with Dr. Haroldine Laws.    Copy of ICM check sent to Dr. Lovena Le.   3 month ICM trend: 02/28/2019        Rosalene Billings, RN 03/05/2019 11:50 AM

## 2019-03-19 ENCOUNTER — Other Ambulatory Visit (HOSPITAL_COMMUNITY): Payer: Self-pay | Admitting: Internal Medicine

## 2019-04-05 ENCOUNTER — Telehealth: Payer: Self-pay

## 2019-04-05 ENCOUNTER — Ambulatory Visit (INDEPENDENT_AMBULATORY_CARE_PROVIDER_SITE_OTHER): Payer: Medicaid Other

## 2019-04-05 DIAGNOSIS — I5022 Chronic systolic (congestive) heart failure: Secondary | ICD-10-CM

## 2019-04-05 DIAGNOSIS — Z95810 Presence of automatic (implantable) cardiac defibrillator: Secondary | ICD-10-CM

## 2019-04-05 NOTE — Telephone Encounter (Signed)
The pt his little doctor person on his latitude monitor came up red. He called [employer] and they told him to call us. I let him talk with Arline Asp, RN

## 2019-04-05 NOTE — Telephone Encounter (Signed)
Patient reports that [employer] tech sup[port helped him trouble shoot his monitor and it is working fine at this time. He will send a remote transmission now. Patient informed that he will only receive a call if there is a problem with the transmission. Patient has not had any symptoms and reports no change ion his condition.

## 2019-04-06 ENCOUNTER — Telehealth: Payer: Self-pay

## 2019-04-06 NOTE — Telephone Encounter (Signed)
Left message for patient to remind of missed remote transmission.  

## 2019-04-07 ENCOUNTER — Telehealth (HOSPITAL_COMMUNITY): Payer: Self-pay

## 2019-04-07 NOTE — Progress Notes (Signed)
EPIC Encounter for ICM Monitoring  Patient Name: Robert Booth is a 49 y.o. male Date: 04/07/2019 Primary Care Physican: System, Pcp Not In Primary Cardiologist:Bensimhon Electrophysiologist:Taylor 03/05/2019 Weight:196lbs  Transmission reviewed and results sent to patient via mychart.  04/04/2019 HeartLogic Heart Failure Index0suggestingwithin normal threshold  Prescribed:Furosemide 40 mgTake 1 tablet (40 mg total) by mouth dailyand can take extra as needed per Dr Prescott Gum 01/22/2019 OV note.   LABS: 01/22/2019 Creatinine 1.37, BUN 17, Potassium 3.8, Sodium 139, GFR >60 05/26/2018 Creatinine 1.27, BUN 17, Potassium 4.1, Sodium 137, GFR >60  Recommendations:  Encouraged to call if experiencing fluid symptoms via my chart.  Follow-up plan: ICM clinic phone appointment on 05/10/2019.   91 day device clinic remote transmission 05/05/2019.  Office appt 04/09/2019 with Dr. Gala Romney.    Copy of ICM check sent to Dr. Ladona Ridgel.      8 Day Data Trend            Karie Soda, RN 04/07/2019 8:42 AM

## 2019-04-07 NOTE — Telephone Encounter (Signed)
Patient called to report that he tested positive for COVID19 today 04/07/19. He did have an appointment to come in the office Friday 04/09/19 which was changed to a virtual visit

## 2019-04-09 ENCOUNTER — Ambulatory Visit (HOSPITAL_COMMUNITY)
Admission: RE | Admit: 2019-04-09 | Discharge: 2019-04-09 | Disposition: A | Discharge status: Home / Self Care | Payer: Medicaid Other | Source: Ambulatory Visit | Attending: Internal Medicine | Admitting: Internal Medicine

## 2019-04-09 ENCOUNTER — Other Ambulatory Visit: Payer: Self-pay

## 2019-04-09 NOTE — Progress Notes (Signed)
Patient did not show for visit. Note left for templating purposes.   No charge.  Robert Meres, MD  6:46 PM       Heart Failure TeleHealth Note  Due to national recommendations of social distancing due to COVID 19, Audio/video telehealth visit is felt to be most appropriate for this patient at this time.  See MyChart message from today for patient consent regarding telehealth for [facility].  Date:  04/09/2019   ID:  Robert Booth, DOB 1971/01/22, MRN [id number]  Location: Home  Provider location: [facility] [facility] Type of Visit: Established patient  PCP:  System, Pcp Not In  Cardiologist:  No primary care provider on file. Primary HF: Serita Degroote  Chief Complaint: Heart Failure follow-up   History of Present Illness:  Robert Booth is a 49 y.o. male with hx of systolic HF with NICM secondary to prolonged ETOH abuse, EF 15% and CKD stage 2.  EF improved to 35-40% in 7/16 then stopped taking meds and EF back down   In 8/16, he presented to [facility] with hemoptysis. Found to have RLL PNA and cardiogenic shock. Treated briefly with milrinone and levophed. Diuresed well. Discharge weight 177 .    Hospitalized 11/2016 with recurrent A/C systolic heart failure in the setting of medication noncompliance. He had been out of medications for 4 months. Echo EF 10-15%   Diuresed with IV lasix.  HF meds restarted. D/c weight 177 lbs   ECHO 04/2017 EF ~20%. RV moderately dilated. Pt underwent ICD placement 07/03/17 - [employer].   Admitted to ED 07/08/17 with ICD shock. Had VT with ATP x 2 and shock x 1. Electrolytes stable. No clear inciting cause.   In 2/20 presented to OSH with CP. Troponins negative. Echo EF 15-20%    We last saw him in 10/20  Echo EF 15% RV moderately HK No LV thrombus   CPX done 11/20  Peak VO2: 22.2 (69% predicted peak VO2) - corrects 25.6 for ibw VE/VCO2 slope:  26  OUES: 2.57  Peak RER: 1.07    He presents via Building services engineer for a telehealth visit today.        Previous studies:   Echo 10/13/14 EF 20% Echo 12/16 EF 35 - 40%No LV clot Echo 11/30/15 EF 35% Echo  11/2016 EF 10-15%. Echo 04/2017 EF 15-20%   CMRI 05/20/2017  1. Mild to moderately dilated LV with EF 19%. Diffuse hypokinesis with septal-lateral dyssynchrony. There is a small thinned area of the basal inferoseptal wall, there does not appear to be an actual VSD. No LV thrombus noted.  2.  Normal RV size with mildly decreased systolic function.  3. Noncoronary LGE pattern as noted above. This could be suggestive of prior myocarditis, less likely cardiac sarcoidosis.    Robert Booth denies symptoms worrisome for COVID 19.   Past Medical History:  Diagnosis Date   AICD (automatic cardioverter/defibrillator) present 07/03/2017   Anxiety    CHF (congestive heart failure) (HCC)    Essential hypertension    History of alcohol abuse    a. Sober since Feb 2016.   LV (left ventricular) mural thrombus 07/2014   Documented n [place]   NICM (nonischemic cardiomyopathy) (HCC)    a. 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   Past Surgical History:  Procedure Laterality Date  CARDIAC CATHETERIZATION  05/2014   In [place], clean cors   HERNIA REPAIR Right Inguinal   ICD IMPLANT N/A 07/03/2017   Procedure: ICD IMPLANT;  Surgeon: Marinus Maw, MD;  Location[facility] INVASIVE CV LAB;  Service: Cardiovascular;  Laterality: N/A;   KNEE SURGERY Right 1998     Current Outpatient Medications  Medication Sig Dispense Refill   allopurinol (ZYLOPRIM) 300 MG tablet Take 1 tablet (300 mg total) by mouth daily. 30 tablet 6   ALPRAZolam (XANAX) 0.5 MG tablet TK 1 T PO  TID FOR ANXIETY     atorvastatin (LIPITOR) 20 MG tablet TK 1 T PO  HS FOR LIPIDS     carvedilol (COREG)  12.5 MG tablet Take 1 tablet (12.5 mg total) by mouth 2 (two) times daily with a meal. 60 tablet 6   dapagliflozin propanediol (FARXIGA) 10 MG TABS tablet Take 10 mg by mouth daily before breakfast. 30 tablet 5   digoxin (LANOXIN) 0.125 MG tablet Take 1 tablet (0.125 mg total) by mouth daily. 90 tablet 3   furosemide (LASIX) 40 MG tablet TAKE 1 TABLET BY MOUTH DAILY 60 tablet 3   hydrALAZINE (APRESOLINE) 50 MG tablet TAKE 1& 1/2 TABLET BY MOUTH THREE TIMES DAILY 153 tablet 0   isosorbide mononitrate (IMDUR) 60 MG 24 hr tablet Take 1 tablet (60 mg total) by mouth daily. 30 tablet 6   mexiletine (MEXITIL) 200 MG capsule Take 1 capsule (200 mg total) by mouth every 12 (twelve) hours. 180 capsule 0   sacubitril-valsartan (ENTRESTO) 97-103 MG Take 1 tablet by mouth 2 (two) times daily. 180 tablet 3   spironolactone (ALDACTONE) 25 MG tablet TAKE 1 TABLET BY MOUTH ONCE DAILY 34 tablet 2   venlafaxine XR (EFFEXOR XR) 37.5 MG 24 hr capsule Take 1 capsule (37.5 mg total) by mouth daily with breakfast. 30 capsule 11   warfarin (COUMADIN) 7.5 MG tablet Take as directed by Coumadin Clinic 35 tablet 0   No current facility-administered medications for this encounter.    Allergies:   Celexa [citalopram hydrobromide] and Lisinopril   Social History:  The patient  reports that he has quit smoking. His smoking use included cigarettes. He has quit using smokeless tobacco.  His smokeless tobacco use included chew. He reports that he does not drink alcohol or use drugs.   Family History:  The patient's family history includes Heart attack (age of onset: 36) in his paternal grandfather; Heart attack (age of onset: 59) in his father; Hypertension in his father, mother, and sister.   ROS:  Please see the history of present illness.   All other systems are personally reviewed and negative.   Exam:  (Video/Tele Health Call; Exam is subjective and or/visual.) General:  Speaks in full sentences. No resp  difficulty. Lungs: Normal respiratory effort with conversation.  Abdomen: Non-distended per patient report Extremities: Pt denies edema. Neuro: Alert & oriented x 3.   Recent Labs: 01/22/2019: B Natriuretic Peptide 443.2; BUN 17; Creatinine, Ser 1.37; Potassium 3.8; Sodium 139  Personally reviewed   Wt Readings from Last 3 Encounters:  01/22/19 87.8 kg (193 lb 9.6 oz)  05/26/18 84.9 kg (187 lb 4 oz)  01/21/18 90.6 kg (199 lb 12.8 oz)      ASSESSMENT AND PLAN:  1. Chronic systolic HF - NICM Cath ok performed [place] 2016. 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 Echo 10/2016 EF 10-15%. LV dysfunction after stopping HF meds. - ECHO 04/16/2017 EF 15-20% - cMRI 2/19 EF 19%. RV mildly decreased .  -  Now s/p Bost Sci ICD 07/03/17. Had ICD shock with VT 07/08/17 - Echo 10/20 EF 15% with moderate RV dysfunction - NYHA II-III - Volume status minimally elevated on ICD. Continue lasix 40 mg daily, with extra as needed.  - Continue Coreg 12.5 bid - Continue digoxin 0.125 mg daily.  - Continue hydralazine 75 mg TID - Continue Imdur to 60 mg daily - Continue entresto 97-103 mg twice a day  - Continue Spiro 25 mg daily.  - Add Faxiga 10 - Plan CPX to further risk stratify for advanced therapies - Reinforced fluid restriction to < 2 L daily, sodium restriction to less than 2000 mg daily, and the importance of daily weights.  - Labs today   2. LV mural thrombus  - Continue warfarin for anticoagulation. No bleeding - No thrombus on echo today.    3. H/o ETOH abuse quit 05/25/2014 was drinking 1/5 liquor daily.  - No longer drinking. No change.     4. VT 07/08/17  - He is s/p [employer] Sci ICD 07/03/17 - Continue mexiletine 200 mg BID. No change.  - ICD interrogated in person: Volume slightly up. + NSVT No therapies delivered Personally reviewed   5. Gout - Continue allopurinol 300 daily.  - Will refer to Dr. Amil Amen in Rheum     COVID screen The patient does not have any symptoms that suggest any  further testing/ screening at this time.  Social distancing reinforced today.  Recommended follow-up:  XXX   Relevant cardiac medications were reviewed at length with the patient today.   The patient does not have concerns regarding their medications at this time.   The following changes were made today:  XXX  Today, I have spent XXX  minutes with the patient with telehealth technology discussing the above issues .    Signed, Glori Bickers, MD  04/09/2019 12:06 PM  Advanced Heart Failure [place] [address] [phone] (office) [phone] (fax)

## 2019-05-06 ENCOUNTER — Telehealth: Payer: Self-pay

## 2019-05-06 MED ORDER — FUROSEMIDE 10 MG/ML IJ SOLN
20.00 | INTRAMUSCULAR | Status: DC
Start: 2019-05-06 — End: 2019-05-06

## 2019-05-06 MED ORDER — NITROGLYCERIN 2 % TD OINT
0.50 | TOPICAL_OINTMENT | TRANSDERMAL | Status: DC
Start: ? — End: 2019-05-06

## 2019-05-06 MED ORDER — ALPRAZOLAM 0.5 MG PO TABS
1.00 | ORAL_TABLET | ORAL | Status: DC
Start: ? — End: 2019-05-06

## 2019-05-06 MED ORDER — PREDNISONE 20 MG PO TABS
20.00 | ORAL_TABLET | ORAL | Status: DC
Start: 2019-05-06 — End: 2019-05-06

## 2019-05-06 MED ORDER — CANAGLIFLOZIN 100 MG PO TABS
100.00 | ORAL_TABLET | ORAL | Status: DC
Start: 2019-05-07 — End: 2019-05-06

## 2019-05-06 MED ORDER — OMEPRAZOLE 20 MG PO CPDR
40.00 | DELAYED_RELEASE_CAPSULE | ORAL | Status: DC
Start: 2019-05-07 — End: 2019-05-06

## 2019-05-06 MED ORDER — VENLAFAXINE HCL ER 37.5 MG PO CP24
37.50 | ORAL_CAPSULE | ORAL | Status: DC
Start: 2019-05-07 — End: 2019-05-06

## 2019-05-06 MED ORDER — SODIUM CHLORIDE FLUSH 0.9 % IV SOLN
10.00 | INTRAVENOUS | Status: DC
Start: ? — End: 2019-05-06

## 2019-05-06 MED ORDER — DIGOXIN 125 MCG PO TABS
125.00 | ORAL_TABLET | ORAL | Status: DC
Start: 2019-05-06 — End: 2019-05-06

## 2019-05-06 MED ORDER — SODIUM CHLORIDE 0.9 % IV SOLN
75.00 | INTRAVENOUS | Status: DC
Start: ? — End: 2019-05-06

## 2019-05-06 MED ORDER — CARVEDILOL 12.5 MG PO TABS
12.50 | ORAL_TABLET | ORAL | Status: DC
Start: 2019-05-06 — End: 2019-05-06

## 2019-05-06 MED ORDER — ACETAMINOPHEN 325 MG PO TABS
650.00 | ORAL_TABLET | ORAL | Status: DC
Start: ? — End: 2019-05-06

## 2019-05-06 MED ORDER — MELATONIN 5 MG PO TABS
5.00 | ORAL_TABLET | ORAL | Status: DC
Start: 2019-05-06 — End: 2019-05-06

## 2019-05-06 MED ORDER — MEXILETINE HCL 200 MG PO CAPS
200.00 | ORAL_CAPSULE | ORAL | Status: DC
Start: 2019-05-06 — End: 2019-05-06

## 2019-05-06 MED ORDER — COLCHICINE 0.6 MG PO TABS
0.60 | ORAL_TABLET | ORAL | Status: DC
Start: 2019-05-06 — End: 2019-05-06

## 2019-05-06 MED ORDER — POLYETHYLENE GLYCOL 3350 17 GM/SCOOP PO POWD
17.00 | ORAL | Status: DC
Start: 2019-05-07 — End: 2019-05-06

## 2019-05-06 MED ORDER — ONDANSETRON HCL 4 MG/2ML IJ SOLN
4.00 | INTRAMUSCULAR | Status: DC
Start: ? — End: 2019-05-06

## 2019-05-06 NOTE — Telephone Encounter (Signed)
Unable to speak  with patient to remind of missed remote transmission 

## 2019-05-19 NOTE — Progress Notes (Signed)
No ICM remote transmission received for 05/10/2019 and next ICM transmission scheduled for 06/14/2019.   

## 2019-06-08 ENCOUNTER — Telehealth (HOSPITAL_COMMUNITY): Payer: Self-pay | Admitting: Cardiology

## 2019-06-08 NOTE — Telephone Encounter (Signed)
Robert Booth with [facility] [phone]) called to request a hospital follow up appt. Patient will be discharged today and needs to be seen with 14 days   HFU 3/22 @1140 

## 2019-06-21 ENCOUNTER — Encounter (HOSPITAL_COMMUNITY): Payer: Medicaid Other | Admitting: Internal Medicine

## 2019-06-21 NOTE — Progress Notes (Signed)
No ICM remote transmission received for 06/14/2019 and next ICM transmission scheduled for 07/12/2019.   

## 2019-07-12 NOTE — Progress Notes (Signed)
No ICM remote transmission received for 07/12/2019.  Patient not participating in ICM monthly follow and unable to reach since January 2021.   Disenrolled from monthly ICM Clinic monthly ICM but device clinic will continue remote monitoring every 91 day and alerts per protocol.

## 2019-08-05 ENCOUNTER — Telehealth: Payer: Self-pay

## 2019-08-05 NOTE — Telephone Encounter (Signed)
Unable to speak  with patient to remind of missed remote transmission 

## 2021-01-18 ENCOUNTER — Other Ambulatory Visit (HOSPITAL_COMMUNITY): Payer: Self-pay

## 2021-01-18 ENCOUNTER — Telehealth (HOSPITAL_COMMUNITY): Payer: Self-pay | Admitting: *Deleted

## 2021-01-18 ENCOUNTER — Other Ambulatory Visit (HOSPITAL_COMMUNITY): Payer: Self-pay | Admitting: *Deleted

## 2021-01-18 NOTE — Telephone Encounter (Signed)
Pt called stating he had recently been incarcerated and didn't have any of his medications. Pt has medicaid and requested refills on all medications but has gone a while without them while in jail. Pt was advised to come in for an office visit tomorrow to figure out which meds to restart, lab work, and be evaluated (last appt. (04/09/19).

## 2021-01-19 ENCOUNTER — Other Ambulatory Visit: Payer: Self-pay

## 2021-01-19 ENCOUNTER — Encounter (HOSPITAL_COMMUNITY): Payer: Self-pay

## 2021-01-19 ENCOUNTER — Other Ambulatory Visit (HOSPITAL_COMMUNITY): Payer: Self-pay

## 2021-01-19 ENCOUNTER — Ambulatory Visit (HOSPITAL_COMMUNITY)
Admission: RE | Admit: 2021-01-19 | Discharge: 2021-01-19 | Disposition: A | Discharge status: Home / Self Care | Payer: Medicaid Other | Source: Ambulatory Visit | Attending: Family Medicine | Admitting: Family Medicine

## 2021-01-19 VITALS — BP 112/77 | HR 83 | Wt 214.6 lb

## 2021-01-19 DIAGNOSIS — F1011 Alcohol abuse, in remission: Secondary | ICD-10-CM

## 2021-01-19 DIAGNOSIS — Z79899 Other long term (current) drug therapy: Secondary | ICD-10-CM | POA: Insufficient documentation

## 2021-01-19 DIAGNOSIS — Z09 Encounter for follow-up examination after completed treatment for conditions other than malignant neoplasm: Secondary | ICD-10-CM | POA: Insufficient documentation

## 2021-01-19 DIAGNOSIS — R0602 Shortness of breath: Secondary | ICD-10-CM | POA: Insufficient documentation

## 2021-01-19 DIAGNOSIS — Z7901 Long term (current) use of anticoagulants: Secondary | ICD-10-CM | POA: Insufficient documentation

## 2021-01-19 DIAGNOSIS — Z8249 Family history of ischemic heart disease and other diseases of the circulatory system: Secondary | ICD-10-CM | POA: Insufficient documentation

## 2021-01-19 DIAGNOSIS — N182 Chronic kidney disease, stage 2 (mild): Secondary | ICD-10-CM | POA: Diagnosis not present

## 2021-01-19 DIAGNOSIS — I130 Hypertensive heart and chronic kidney disease with heart failure and stage 1 through stage 4 chronic kidney disease, or unspecified chronic kidney disease: Secondary | ICD-10-CM | POA: Insufficient documentation

## 2021-01-19 DIAGNOSIS — F1010 Alcohol abuse, uncomplicated: Secondary | ICD-10-CM | POA: Insufficient documentation

## 2021-01-19 DIAGNOSIS — I472 Ventricular tachycardia, unspecified: Secondary | ICD-10-CM

## 2021-01-19 DIAGNOSIS — M1000 Idiopathic gout, unspecified site: Secondary | ICD-10-CM

## 2021-01-19 DIAGNOSIS — Z95810 Presence of automatic (implantable) cardiac defibrillator: Secondary | ICD-10-CM | POA: Diagnosis not present

## 2021-01-19 DIAGNOSIS — I513 Intracardiac thrombosis, not elsewhere classified: Secondary | ICD-10-CM

## 2021-01-19 DIAGNOSIS — Z7984 Long term (current) use of oral hypoglycemic drugs: Secondary | ICD-10-CM | POA: Insufficient documentation

## 2021-01-19 DIAGNOSIS — Z87891 Personal history of nicotine dependence: Secondary | ICD-10-CM | POA: Insufficient documentation

## 2021-01-19 DIAGNOSIS — M109 Gout, unspecified: Secondary | ICD-10-CM | POA: Insufficient documentation

## 2021-01-19 DIAGNOSIS — R42 Dizziness and giddiness: Secondary | ICD-10-CM | POA: Insufficient documentation

## 2021-01-19 DIAGNOSIS — I428 Other cardiomyopathies: Secondary | ICD-10-CM | POA: Diagnosis not present

## 2021-01-19 DIAGNOSIS — Z86718 Personal history of other venous thrombosis and embolism: Secondary | ICD-10-CM | POA: Diagnosis not present

## 2021-01-19 DIAGNOSIS — Z888 Allergy status to other drugs, medicaments and biological substances status: Secondary | ICD-10-CM | POA: Diagnosis not present

## 2021-01-19 DIAGNOSIS — E162 Hypoglycemia, unspecified: Secondary | ICD-10-CM

## 2021-01-19 DIAGNOSIS — Z139 Encounter for screening, unspecified: Secondary | ICD-10-CM

## 2021-01-19 DIAGNOSIS — I5022 Chronic systolic (congestive) heart failure: Secondary | ICD-10-CM

## 2021-01-19 DIAGNOSIS — I739 Peripheral vascular disease, unspecified: Secondary | ICD-10-CM

## 2021-01-19 LAB — COMPREHENSIVE METABOLIC PANEL
ALT: 42 U/L (ref 0–44)
AST: 28 U/L (ref 15–41)
Albumin: 4.2 g/dL (ref 3.5–5.0)
Alkaline Phosphatase: 95 U/L (ref 38–126)
Anion gap: 8 (ref 5–15)
BUN: 14 mg/dL (ref 6–20)
CO2: 28 mmol/L (ref 22–32)
Calcium: 9.7 mg/dL (ref 8.9–10.3)
Chloride: 101 mmol/L (ref 98–111)
Creatinine, Ser: 1.01 mg/dL (ref 0.61–1.24)
GFR, Estimated: >60 mL/min (ref >60)
Glucose, Bld: 98 mg/dL (ref 70–99)
Potassium: 3.8 mmol/L (ref 3.5–5.1)
Sodium: 137 mmol/L (ref 135–145)
Total Bilirubin: 0.9 mg/dL (ref 0.3–1.2)
Total Protein: 8.5 g/dL — ABNORMAL HIGH (ref 6.5–8.1)

## 2021-01-19 LAB — CBC
HCT: 41.5 % (ref 39.0–52.0)
Hemoglobin: 14.2 g/dL (ref 13.0–17.0)
MCH: 29.3 pg (ref 26.0–34.0)
MCHC: 34.2 g/dL (ref 30.0–36.0)
MCV: 85.7 fL (ref 80.0–100.0)
Platelets: 270 10*3/uL (ref 150–400)
RBC: 4.84 MIL/uL (ref 4.22–5.81)
RDW: 13.2 % (ref 11.5–15.5)
WBC: 8.9 10*3/uL (ref 4.0–10.5)
nRBC: 0 % (ref 0.0–0.2)

## 2021-01-19 LAB — HEMOGLOBIN A1C
Hgb A1c MFr Bld: 4.5 % — ABNORMAL LOW (ref 4.8–5.6)
Mean Plasma Glucose: 82.45 mg/dL

## 2021-01-19 LAB — DIGOXIN LEVEL: Digoxin Level: 0.4 ng/mL — ABNORMAL LOW (ref 0.8–2.0)

## 2021-01-19 MED ORDER — DAPAGLIFLOZIN PROPANEDIOL 10 MG PO TABS
10.0000 mg | ORAL_TABLET | Freq: Every day | ORAL | 6 refills | Status: DC
  Filled 2021-01-19: qty 30, 30d supply, fill #0

## 2021-01-19 MED ORDER — APIXABAN 5 MG PO TABS
5.0000 mg | ORAL_TABLET | Freq: Two times a day (BID) | ORAL | 6 refills | Status: DC
  Filled 2021-01-19: qty 60, 30d supply, fill #0

## 2021-01-19 MED ORDER — ENTRESTO 24-26 MG PO TABS
1.0000 | ORAL_TABLET | Freq: Two times a day (BID) | ORAL | 3 refills | Status: DC
  Filled 2021-01-19: qty 60, 30d supply, fill #0

## 2021-01-19 MED ORDER — ISOSORBIDE MONONITRATE ER 60 MG PO TB24
60.0000 mg | ORAL_TABLET | Freq: Every day | ORAL | 6 refills | Status: DC
  Filled 2021-01-19: qty 30, 30d supply, fill #0

## 2021-01-19 MED ORDER — CARVEDILOL 12.5 MG PO TABS
12.5000 mg | ORAL_TABLET | Freq: Two times a day (BID) | ORAL | 6 refills | Status: DC
  Filled 2021-01-19: qty 60, 30d supply, fill #0

## 2021-01-19 MED ORDER — MEXILETINE HCL 200 MG PO CAPS
200.0000 mg | ORAL_CAPSULE | Freq: Two times a day (BID) | ORAL | 6 refills | Status: DC
  Filled 2021-01-19: qty 60, 30d supply, fill #0

## 2021-01-19 MED ORDER — TORSEMIDE 20 MG PO TABS
60.0000 mg | ORAL_TABLET | Freq: Every day | ORAL | 3 refills | Status: DC
  Filled 2021-01-19: qty 90, 30d supply, fill #0

## 2021-01-19 MED ORDER — SPIRONOLACTONE 25 MG PO TABS
25.0000 mg | ORAL_TABLET | Freq: Every day | ORAL | 6 refills | Status: DC
  Filled 2021-01-19: qty 30, 30d supply, fill #0

## 2021-01-19 MED ORDER — DIGOXIN 125 MCG PO TABS
0.1250 mg | ORAL_TABLET | Freq: Every day | ORAL | 6 refills | Status: DC
  Filled 2021-01-19: qty 30, 30d supply, fill #0

## 2021-01-19 MED ORDER — HYDRALAZINE HCL 50 MG PO TABS
75.0000 mg | ORAL_TABLET | Freq: Three times a day (TID) | ORAL | 6 refills | Status: DC
  Filled 2021-01-19: qty 135, 30d supply, fill #0

## 2021-01-19 NOTE — Patient Instructions (Addendum)
Stop Furosemide  Start Torsemide 60 mg (3 tabs) Daily  Continue/Restart all other medications as per your med list below  Labs done today, your results will be available in MyChart, we will contact you for abnormal readings.  Your physician recommends that you return for lab work in: 1 week  Your physician recommends that you schedule a follow-up appointment in: 04/06/21 as scheduled  If you have any questions or concerns before your next appointment please send [place] a message through [place] or call our office at [phone].    TO LEAVE A MESSAGE FOR THE NURSE SELECT OPTION 2, PLEASE LEAVE A MESSAGE INCLUDING: YOUR NAME DATE OF BIRTH CALL BACK NUMBER REASON FOR CALL**this is important as we prioritize the call backs  YOU WILL RECEIVE A CALL BACK THE SAME DAY AS LONG AS YOU CALL BEFORE 4:00 PM  At the [facility], you and your health needs are our priority. As part of our continuing mission to provide you with exceptional heart care, we have created designated Provider Care Teams. These Care Teams include your primary Cardiologist (physician) and Advanced Practice Providers (APPs- Physician Assistants and Nurse Practitioners) who all work together to provide you with the care you need, when you need it.   You may see any of the following providers on your designated Care Team at your next follow up: Dr Arvilla Meres Dr Carron Curie, NP Robbie Lis, [place] [facility] [place], [place] Karle Plumber, PharmD   Please be sure to bring in all your medications bottles to every appointment.

## 2021-01-19 NOTE — Progress Notes (Signed)
[facility] Note  Date:  01/19/2021   ID:  Robert Booth, DOB 1970-12-27, MRN [id number]  Location: Home  Provider location: [facility] Type of Visit: Established patient  PCP:  Pcp, No  HF Cardiologist: Dr. Gala Romney  Chief Complaint: Heart Failure follow-up   History of Present Illness: Robert Booth is a 49 y.o.male with hx of systolic HF with NICM secondary to prolonged ETOH abuse, EF 15% and CKD stage 2.  EF improved to 35-40% in 7/16 then stopped taking meds and EF back down   In 8/16, he presented to [facility] with hemoptysis. Found to have RLL PNA and cardiogenic shock. Treated briefly with milrinone and levophed. Diuresed well. Discharge weight 177 .    Hospitalized 11/2016 with recurrent A/C systolic heart failure in the setting of medication noncompliance. He had been out of medications for 4 months. Echo EF 10-15%. Diuresed with IV lasix. HF meds restarted. D/c weight 177 lbs   Echo 04/2017 EF ~20%. RV moderately dilated. Pt underwent ICD placement 07/03/17 - [employer].   Admitted to ED 07/08/17 with ICD shock. Had VT with ATP x 2 and shock x 1. Electrolytes stable. No clear inciting cause.   In 2/20 presented to OSH with CP. Troponins negative. Echo EF 15-20%    Echo 12/2018 EF: 15% RV moderately HK No LV thrombus.   CPX 11/20 showed mild limitations due by body habitus and deconditioning. No clear HF limitation.  Per chart review, Echo (2/21) at [facility] showed EF <20%, severe global HK, grade 3 DD, mild/moderate MR, mild PR. He followed with Dr. Clois Comber for ICD checks as prison did not offer remote transmissions.  Today he returns for HF follow up. He has been in prison for past 20 months and recently was released a couple weeks ago. Now living with his sister. Says his HF medications were switched around while in prison. Was not very active and eating poorly. 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 He is SOB with walking short distances on flat ground. DeniesCP, dizziness, edema, or PND/Orthopnea. Appetite ok. No fever or chills. He does not weight at home or have a scale. He has not had ETOH in 21 months. No drugs or tobacco.  CPX done 11/20  Peak VO2: 22.2 (69% predicted peak VO2) - corrects 25.6 for ibw VE/VCO2 slope:  26  OUES: 2.57  Peak RER: 1.07   Previous studies: Echo 7/16 EF 20% Echo 12/16 EF 35 - 40% No LV clot Echo 8/17 EF 35% Echo 9/18 EF 10-15%. Echo 1/19 EF 15-20% Echo 10/20 EF 15% RV moderately HK No LV thrombus.  Echo (2/21) at [facility], EF <20%, severe global HK, grade 3 DD, mild/moderate MR, mild PR.   CMRI 05/20/2017  1. Mild to moderately dilated LV with EF 19%. Diffuse hypokinesis with septal-lateral dyssynchrony. There is a small thinned area of the basal inferoseptal wall, there does not appear to be an actual VSD. No LV thrombus noted.  2.  Normal RV size with mildly decreased systolic function.  3. Noncoronary LGE pattern as noted above. This could be suggestive of prior myocarditis, less likely cardiac sarcoidosis.  Past Medical History:  Diagnosis Date   AICD (automatic cardioverter/defibrillator) present 07/03/2017   Anxiety    CHF (congestive heart failure) (HCC)    Essential hypertension    History of alcohol abuse    a. Sober since Feb 2016.   LV (left  ventricular) mural thrombus 07/2014   Documented n [place]   NICM (nonischemic cardiomyopathy) (HCC)    a. 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   Past Surgical History:  Procedure Laterality Date   CARDIAC CATHETERIZATION  05/2014   In [place], clean cors   HERNIA REPAIR Right Inguinal   ICD IMPLANT N/A 07/03/2017   Procedure: ICD IMPLANT;  Surgeon: Marinus Maw, MD;  Location: [facility] INVASIVE  CV LAB;  Service: Cardiovascular;  Laterality: N/A;   KNEE SURGERY Right 1998   Current Outpatient Medications  Medication Sig Dispense Refill   allopurinol (ZYLOPRIM) 300 MG tablet Take 1 tablet (300 mg total) by mouth daily. 30 tablet 6   ALPRAZolam (XANAX) 0.5 MG tablet TK 1 T PO  TID FOR ANXIETY     apixaban (ELIQUIS) 5 MG TABS tablet Take 1 tablet (5 mg total) by mouth 2 (two) times daily. 60 tablet 6   atorvastatin (LIPITOR) 20 MG tablet TK 1 T PO  HS FOR LIPIDS     sacubitril-valsartan (ENTRESTO) 24-26 MG Take 1 tablet by mouth 2 (two) times daily. 60 tablet 3   torsemide (DEMADEX) 20 MG tablet Take 3 tablets (60 mg total) by mouth daily. 90 tablet 3   carvedilol (COREG) 12.5 MG tablet Take 1 tablet (12.5 mg total) by mouth 2 (two) times daily with a meal. 60 tablet 6   dapagliflozin propanediol (FARXIGA) 10 MG TABS tablet Take 1 tablet (10 mg total) by mouth daily before breakfast. 30 tablet 6   digoxin (LANOXIN) 0.125 MG tablet Take 1 tablet (0.125 mg total) by mouth daily. 30 tablet 6   hydrALAZINE (APRESOLINE) 50 MG tablet Take 1.5 tablets (75 mg total) by mouth 3 (three) times daily. 135 tablet 6   isosorbide mononitrate (IMDUR) 60 MG 24 hr tablet Take 1 tablet (60 mg total) by mouth daily. 30 tablet 6   mexiletine (MEXITIL) 200 MG capsule Take 1 capsule (200 mg total) by mouth 2 (two) times daily. 60 capsule 6   spironolactone (ALDACTONE) 25 MG tablet Take 1 tablet (25 mg total) by mouth daily. 30 tablet 6   No current facility-administered medications for this encounter.   Allergies:   Celexa [citalopram hydrobromide], Effexor [venlafaxine], and Lisinopril   Social History:  The patient  reports that he has quit smoking. His smoking use included cigarettes. He has quit using smokeless tobacco.  His smokeless tobacco use included chew. He reports that he does not drink alcohol and does not use drugs.   Family History:  The patient's family history includes Heart attack (age of  onset: 66) in his paternal grandfather; Heart attack (age of onset: 28) in his father; Hypertension in his father, mother, and sister.   ROS:  Please see the history of present illness.   All other systems are personally reviewed and negative.   Recent Labs: 01/19/2021: ALT 42; BUN 14; Creatinine, Ser 1.01; Hemoglobin 14.2; Platelets 270; Potassium 3.8; Sodium 137  Personally reviewed   Wt Readings from Last 3 Encounters:  01/19/21 97.3 kg (214 lb 9.6 oz)  01/22/19 87.8 kg (193 lb 9.6 oz)  05/26/18 84.9 kg (187 lb 4 oz)    BP 112/77   Pulse 83   Wt 97.3 kg (214 lb 9.6 oz)   SpO2 98%   BMI 31.69 kg/m   Physical  Exam: General:  NAD. No resp difficulty, walked into clinic with RW. HEENT: Normal Neck: Supple. No JVD. Carotids 2+ bilat; no bruits. No lymphadenopathy or thryomegaly appreciated. Cor: PMI nondisplaced. Regular rate & rhythm. No rubs, gallops or murmurs. Lungs: Clear Abdomen: Soft, nontender, nondistended. No hepatosplenomegaly. No bruits or masses. Good bowel sounds. Extremities: No cyanosis, clubbing, rash, edema Neuro: Alert & oriented x 3, cranial nerves grossly intact. Moves all 4 extremities w/o difficulty. Affect pleasant.  ECG: Sinus rhythm 88 bpm (personally reviewed).  Device Interrogation:  HL Score 11, no VT, no shocks, 1.7 hours activity (personally reviewed).  Assessment/Plan: 1. Chronic systolic HF - NICM  - Cath ok performed [place] 2016. 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  - Echo 10/2016 EF 10-15%. LV dysfunction after stopping HF meds. - Echo 04/16/2017 EF 15-20% - cMRI 2/19 EF 19%. RV mildly decreased .  - s/p [employer] Sci ICD 07/03/17. Had ICD shock with VT 07/08/17 - Echo 10/20 EF: 15% with moderate RV dysfunction - CPX 11/20 with mild functional limitation; no clear HF limitation despite EF<20% - NYHA II-III, confounded by physical deconditioning and amputated toes. - Has been off most HF meds since incarceration. Plan to add back as able - He appears  slightly volume up today on exam, weight up 21 lbs over last 2 years. - Restart Farxiga 10 mg daily. - Stop lasix. - Start torsemide 60 mg daily. - Continue Entresto 24/26 mg bid. - Continue digoxin 0.125 mg daily.  - Continue spiro 25 mg daily.  - Add beta blocker next. - Consider adding back hydral/imdur - Reinforced fluid restriction to < 2 L daily, sodium restriction to less than 2000 mg daily, and the importance of daily weights.  - ? Candidacy for advanced therapies if he can demonstrate compliance with medications - Labs today; repeat BMET in 10 days.   2. LV mural thrombus  - He has been off warfarin and is now on Eliquis. - No thrombus on echo 2/21. - No bleeding issues. - CBC today.   3. H/o ETOH abuse - Previously drinking 1/5 liquor daily.  - Quit ETOH 21 months ago - Congratulated on cessation. Encouraged to remain quit    4. VT 07/08/17  - He is s/p [place] Sci ICD 07/03/17. - Continue mexiletine 200 mg bid.  - No change. No VT on device interrogation - He is re-establishing his EP care with Dr. Ladona Ridgel.   5. Gout - Continue allopurinol 300 daily.  - No recent flares. Previously referred to Rheum   6. 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 - He is not diabetic. - Has follow up with Podiatry.  7. SDOH - He has applied for disability and he has Medicaid. - He lives with his sister, but has transportation needs - He was given a scale today and HFSW to help with referral to PCP.  Follow up in 4 weeks with APP for further medication titration. Will update echo and follow up with Dr. Gala Romney as scheduled in 3 months.  Signed, Jacklynn Ganong, FNP  01/19/2021 7:40 PM  [facility] [address] Heart and Vascular [place] [address] [phone] (office) [phone] (fax)

## 2021-01-19 NOTE — Progress Notes (Signed)
Heart and Vascular Care Navigation  01/19/2021  Robert Booth June 04, 1970 [id number]  Reason for Referral: transportation resources   Engaged with patient face to face for initial visit for Heart and Vascular Care Coordination.                                                                                                   Assessment:  Pt met with pt in clinic to discuss above concerns.  Pt recently released from prison and living with sister.  No concerns expressed with housing at this time.  Has no source of income but has disability hearing on Monday to see if he can get SSI.  Pt needing help with transportation.  Got ride from his mother today but she lives in [place]F an is not always available to help with this.  Discussed that with his Medicaid he is eligible for transportation services through this benefit- provided him with number to Medicaid transport.  Also enrolled pt in [facility] and informed him that this could be used for [facility] appts if Medicaid is unable to transport.                                   HRT/VAS Care Coordination     Living arrangements for the past 2 months Single Family Home   Lives with: Siblings   Patient Current Insurance Coverage Medicaid   Patient Has Concern With Paying Medical Bills No   Does Patient Have Prescription Coverage? Yes   Home Assistive Devices/Equipment Eyeglasses       Social History:                                                                             SDOH Screenings   Alcohol Screen: Not on file  Depression (PHQ2-9): Not on file  Financial Resource Strain: Not on file  Food Insecurity: Not on file  Housing: Not on file  Physical Activity: Not on file  Social Connections: Not on file  Stress: Not on file  Tobacco Use: Medium Risk   Smoking Tobacco Use: Former   Smokeless Tobacco Use: Former   Passive Exposure: Not on Pensions consultant Needs: No Transportation Needs   Lack of Transportation (Medical): No    Lack of Transportation (Non-Medical): No    SDOH Interventions: Financial Resources:   Awaiting SSI/disability- hearing on Monday per patient   Food Insecurity:   None reported  Housing Insecurity:   None reported  Transportation:   Transportation Interventions: [employer], Other (Comment) (also referred to Medicaid transport)     Follow-up plan:     Referred to [facility] transport and informed of Medicaid benefit- pt to call [employer] to set up rides to upcoming appts.  Jorge Ny, LCSW Clinical Social Worker [facility] Desk#: [phone] Cell#: [phone]

## 2021-01-22 ENCOUNTER — Other Ambulatory Visit (HOSPITAL_COMMUNITY): Payer: Self-pay

## 2021-01-22 ENCOUNTER — Telehealth (HOSPITAL_COMMUNITY): Payer: Self-pay | Admitting: Pharmacy Technician

## 2021-01-22 MED ORDER — DAPAGLIFLOZIN PROPANEDIOL 10 MG PO TABS
10.0000 mg | ORAL_TABLET | Freq: Every day | ORAL | 3 refills | Status: DC
  Filled 2021-01-22: qty 90, 90d supply, fill #0

## 2021-01-22 MED ORDER — BIDIL 20-37.5 MG PO TABS
2.0000 | ORAL_TABLET | Freq: Two times a day (BID) | ORAL | 3 refills | Status: DC
  Filled 2021-01-22: qty 360, 90d supply, fill #0

## 2021-01-22 NOTE — Telephone Encounter (Signed)
Advanced Heart Failure Patient Advocate Encounter  Prior Authorization for Marcelline Deist has been approved.    PA# [id number] Effective dates: 01/22/21 through 01/22/22  Patients co-pay is $4 (90 days)  Sent message to [place] (FNP), requested use of Bidil and d/c of separate components. She agreed. Spoke with Leotis Shames, [facility]) suggested dosing at 2 tablets TID. Called and spoke with patient. He is aware to D/C separate Bidil components and to start taking brand Bidil again. Beckie Salts, (RN) a request for 90 day RX of Bidil, patient also requested a new RX for Marcelline Deist to be sent to [facility] outpatient.   Archer Asa, CPhT

## 2021-01-22 NOTE — Telephone Encounter (Signed)
Advanced Heart Failure Patient Advocate Encounter  Prior Authorization for Sherryll Burger has been submitted and approved on [employer].   PA# [id number] Effective dates: 01/22/21 through 01/22/22  Patients co-pay is $4  Patient Advocate Encounter   Received notification from [facility] that prior authorization for Marcelline Deist is required.   PA submitted on [employer] [place] [id number] W Status is pending   Will continue to follow.

## 2021-01-22 NOTE — Addendum Note (Signed)
Addended by: Noralee Space on: 01/22/2021 04:25 PM   Modules accepted: Orders

## 2021-01-23 ENCOUNTER — Other Ambulatory Visit (HOSPITAL_COMMUNITY): Payer: Self-pay

## 2021-01-29 ENCOUNTER — Other Ambulatory Visit (HOSPITAL_COMMUNITY): Payer: Self-pay

## 2021-01-29 ENCOUNTER — Other Ambulatory Visit: Payer: Self-pay

## 2021-01-29 ENCOUNTER — Ambulatory Visit (HOSPITAL_COMMUNITY)
Admission: RE | Admit: 2021-01-29 | Discharge: 2021-01-29 | Disposition: A | Discharge status: Home / Self Care | Payer: Medicaid Other | Source: Ambulatory Visit | Attending: Internal Medicine | Admitting: Internal Medicine

## 2021-01-29 DIAGNOSIS — I5022 Chronic systolic (congestive) heart failure: Secondary | ICD-10-CM | POA: Insufficient documentation

## 2021-01-29 LAB — BASIC METABOLIC PANEL
Anion gap: 10 (ref 5–15)
BUN: 24 mg/dL — ABNORMAL HIGH (ref 6–20)
CO2: 25 mmol/L (ref 22–32)
Calcium: 9.4 mg/dL (ref 8.9–10.3)
Chloride: 103 mmol/L (ref 98–111)
Creatinine, Ser: 1.55 mg/dL — ABNORMAL HIGH (ref 0.61–1.24)
GFR, Estimated: 54 mL/min — ABNORMAL LOW (ref >60)
Glucose, Bld: 106 mg/dL — ABNORMAL HIGH (ref 70–99)
Potassium: 3.6 mmol/L (ref 3.5–5.1)
Sodium: 138 mmol/L (ref 135–145)

## 2021-01-29 NOTE — Progress Notes (Signed)
CSW informed pt unable to afford $4 copay for medication.  CSW able to assist through patient care fund with copay.   Informed pt of local pharmacy that is able to waive copays for Medicaid recipients- pt will plan to get medications transferred to [facility] so he doesn't have to be concerned about copays in the future.  Burna Sis, LCSW Clinical Social Worker [facility] Desk#: [phone] Cell#: [phone]

## 2021-01-29 NOTE — Addendum Note (Signed)
Encounter addended by: Burna Sis, LCSW on: 01/29/2021 2:45 PM  Actions taken: Clinical Note Signed

## 2021-01-30 ENCOUNTER — Telehealth (HOSPITAL_COMMUNITY): Payer: Self-pay | Admitting: Surgery

## 2021-01-30 ENCOUNTER — Encounter: Payer: Self-pay | Admitting: Podiatry

## 2021-01-30 ENCOUNTER — Ambulatory Visit (INDEPENDENT_AMBULATORY_CARE_PROVIDER_SITE_OTHER): Payer: Medicaid Other | Admitting: Podiatry

## 2021-01-30 DIAGNOSIS — F1011 Alcohol abuse, in remission: Secondary | ICD-10-CM

## 2021-01-30 DIAGNOSIS — Z89439 Acquired absence of unspecified foot: Secondary | ICD-10-CM

## 2021-01-30 DIAGNOSIS — G621 Alcoholic polyneuropathy: Secondary | ICD-10-CM

## 2021-01-30 DIAGNOSIS — I5022 Chronic systolic (congestive) heart failure: Secondary | ICD-10-CM

## 2021-01-30 MED ORDER — TORSEMIDE 20 MG PO TABS: 40.0000 mg | ORAL_TABLET | Freq: Every day | ORAL | 6 refills | Status: DC

## 2021-01-30 NOTE — Telephone Encounter (Signed)
-----   Message from Jacklynn Ganong, [place] sent at 01/30/2021  3:16 PM EDT ----- Kidney function elevated, please decrease torsemide to 40 mg daily. Repeat BMET in 2 weeks

## 2021-01-30 NOTE — Telephone Encounter (Signed)
I called patient to review results and instructions per Prince Rome NP.  He is agreeable.  I have updated medications in [facility] and scheduled repeat labwork for Nov 14th.

## 2021-01-30 NOTE — Progress Notes (Signed)
  Subjective:  Patient ID: Robert Booth, male    DOB: 1970/09/17,   MRN: [id number]  Chief Complaint  Patient presents with   Peripheral Neuropathy    Bilateral bottom foot nerve pains with burning and prickling sensations. Pt states he does have neuropathy and have taken Gabapentin in the past but states it did not help with nerve pain.    Foot Orthotics    Pt is interested in orthotics. Pt has had amputations of all 10 toes.    Phantom Pain    Bilateral phantom pains where pt had amputation of all 10 toes. Stabbing pain.     50 y.o. male presents for concern of neuropathy and to get fitted for new shoes. Patient has a history of bilateral transmetatarsal amputations. Has a history of neuropathy and alcohol abuse. Relates burning tingling pain in his feet.  . Denies any other pedal complaints. Denies n/v/f/c.   Past Medical History:  Diagnosis Date   AICD (automatic cardioverter/defibrillator) present 07/03/2017   Anxiety    CHF (congestive heart failure) (HCC)    Essential hypertension    History of alcohol abuse    a. Sober since Feb 2016.   LV (left ventricular) mural thrombus 07/2014   Documented n [place]   NICM (nonischemic cardiomyopathy) (HCC)    a. 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    Objective:  Physical Exam: Vascular: DP/PT pulses 2/4 bilateral. CFT <3 seconds. Normal hair growth on digits. No edema.  Skin. No lacerations or abrasions bilateral feet.  Musculoskeletal: MMT 5/5 bilateral lower extremities in DF, PF, Inversion and Eversion. Deceased ROM in DF of ankle joint. Transmetatrsal amputations bilateral.  Neurological: Sensation intact to light touch.   Assessment:   1. History of alcohol abuse   2. Alcohol-induced polyneuropathy (HCC)   3. History of transmetatarsal  amputation of foot (HCC)      Plan:  Patient was evaluated and treated and all questions answered. -Discussed and educated patient on diabetic foot care, especially with  regards to the vascular, neurological and musculoskeletal systems.  -Stressed the importance of good glycemic control and the detriment of not  controlling glucose levels in relation to the foot. -Discussed supportive shoes at all times and checking feet regularly.  -Will be fitted for DM shoes  -Answered all patient questions -Patient to return  in 1 year for diabetic foot exam.  -Patient advised to call the office if any problems or questions arise in the meantime.   Louann Sjogren, DPM

## 2021-02-01 ENCOUNTER — Other Ambulatory Visit (HOSPITAL_COMMUNITY): Payer: Self-pay

## 2021-02-07 ENCOUNTER — Other Ambulatory Visit (HOSPITAL_COMMUNITY): Payer: Self-pay | Admitting: Cardiology

## 2021-02-07 MED ORDER — BLOOD PRESSURE CUFF MISC: 1.0000 | 0 refills | Status: DC | PRN

## 2021-02-07 NOTE — Progress Notes (Signed)
Blood pressure cuff

## 2021-02-08 ENCOUNTER — Telehealth (HOSPITAL_COMMUNITY): Payer: Self-pay | Admitting: Licensed Clinical Social Worker

## 2021-02-08 NOTE — Telephone Encounter (Signed)
Pt called CSW to request help with getting fl2 which he needs to get accepted into a housing program.  CSW explained that fl2s are traditionally filled out by pt PCP but pt has not established with PCP yet- doesn't have appt until mid December.  CSW messaged [facility] RN Case manager to request sooner appt- she was able to get pt scheduled for next week in their clinic- pt updated  Will continue to follow and assist as needed  Burna Sis, LCSW Clinical Social Worker [facility] Desk#: [phone] Cell#: [phone]

## 2021-02-12 ENCOUNTER — Ambulatory Visit (HOSPITAL_COMMUNITY)
Admission: RE | Admit: 2021-02-12 | Discharge: 2021-02-12 | Disposition: A | Discharge status: Home / Self Care | Payer: Medicaid Other | Source: Ambulatory Visit | Attending: Family Medicine | Admitting: Family Medicine

## 2021-02-12 ENCOUNTER — Other Ambulatory Visit: Payer: Self-pay

## 2021-02-12 DIAGNOSIS — I5022 Chronic systolic (congestive) heart failure: Secondary | ICD-10-CM | POA: Insufficient documentation

## 2021-02-12 LAB — BASIC METABOLIC PANEL
Anion gap: 10 (ref 5–15)
BUN: 28 mg/dL — ABNORMAL HIGH (ref 6–20)
CO2: 21 mmol/L — ABNORMAL LOW (ref 22–32)
Calcium: 9.4 mg/dL (ref 8.9–10.3)
Chloride: 102 mmol/L (ref 98–111)
Creatinine, Ser: 1.43 mg/dL — ABNORMAL HIGH (ref 0.61–1.24)
GFR, Estimated: 59 mL/min — ABNORMAL LOW (ref >60)
Glucose, Bld: 92 mg/dL (ref 70–99)
Potassium: 4.2 mmol/L (ref 3.5–5.1)
Sodium: 133 mmol/L — ABNORMAL LOW (ref 135–145)

## 2021-02-13 ENCOUNTER — Encounter: Payer: Self-pay | Admitting: Family Medicine

## 2021-02-13 ENCOUNTER — Encounter: Payer: Self-pay | Admitting: Internal Medicine

## 2021-02-13 ENCOUNTER — Ambulatory Visit: Payer: Medicaid Other | Attending: Family Medicine | Admitting: Family Medicine

## 2021-02-13 ENCOUNTER — Ambulatory Visit (INDEPENDENT_AMBULATORY_CARE_PROVIDER_SITE_OTHER): Payer: Medicaid Other | Admitting: Internal Medicine

## 2021-02-13 VITALS — BP 134/70 | HR 91 | Ht 69.0 in | Wt 224.0 lb

## 2021-02-13 VITALS — BP 128/90 | HR 80 | Ht 69.0 in | Wt 223.0 lb

## 2021-02-13 DIAGNOSIS — Z1211 Encounter for screening for malignant neoplasm of colon: Secondary | ICD-10-CM

## 2021-02-13 DIAGNOSIS — I428 Other cardiomyopathies: Secondary | ICD-10-CM | POA: Diagnosis not present

## 2021-02-13 DIAGNOSIS — G546 Phantom limb syndrome with pain: Secondary | ICD-10-CM

## 2021-02-13 DIAGNOSIS — I240 Acute coronary thrombosis not resulting in myocardial infarction: Secondary | ICD-10-CM

## 2021-02-13 DIAGNOSIS — I10 Essential (primary) hypertension: Secondary | ICD-10-CM

## 2021-02-13 DIAGNOSIS — R52 Pain, unspecified: Secondary | ICD-10-CM

## 2021-02-13 DIAGNOSIS — F419 Anxiety disorder, unspecified: Secondary | ICD-10-CM | POA: Diagnosis not present

## 2021-02-13 DIAGNOSIS — Z95810 Presence of automatic (implantable) cardiac defibrillator: Secondary | ICD-10-CM | POA: Diagnosis not present

## 2021-02-13 DIAGNOSIS — F32A Depression, unspecified: Secondary | ICD-10-CM | POA: Insufficient documentation

## 2021-02-13 DIAGNOSIS — Z89439 Acquired absence of unspecified foot: Secondary | ICD-10-CM

## 2021-02-13 LAB — CUP PACEART INCLINIC DEVICE CHECK
Date Time Interrogation Session: 20221115084316
HighPow Impedance: 69 Ohm
HighPow Impedance: 83 Ohm
Implantable Lead Implant Date: 20190404
Implantable Lead Location: 753860
Implantable Lead Model: 292
Implantable Lead Serial Number: 446269
Implantable Pulse Generator Implant Date: 20190404
Lead Channel Impedance Value: 447 Ohm
Lead Channel Pacing Threshold Amplitude: 0.9 V
Lead Channel Pacing Threshold Pulse Width: 0.4 ms
Lead Channel Sensing Intrinsic Amplitude: 17.1 mV
Lead Channel Setting Pacing Amplitude: 2 V
Lead Channel Setting Pacing Pulse Width: 0.4 ms
Lead Channel Setting Sensing Sensitivity: 0.5 mV
Pulse Gen Serial Number: 244129

## 2021-02-13 MED ORDER — GABAPENTIN 300 MG PO CAPS: 300.0000 mg | ORAL_CAPSULE | Freq: Two times a day (BID) | ORAL | 3 refills | Status: DC

## 2021-02-13 NOTE — Progress Notes (Signed)
Subjective:  Patient ID: Robert Booth, male    DOB: Sep 26, 1970  Age: 50 y.o. MRN: [id number]  CC: Hypertension   HPI Robert Booth is a 50 y.o. 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 He had been in prison for the last 20 months and released a couple weeks ago on10/07/2020  Interval History: He is in pain in his feet as he feels like something jabbing at his toes.  He was receiving Xanax (last dose was in 2020) from her previous PCP for anxiety; he is also on Zoloft and he receives Zoloft from [employer] of the [place].  He needs assistance with housing and needs an FL2 form completed for this. It needs to be faxed to Konrad Dolores - Care Coordinator with the [facility] ([phone]). He currently resides with his sister. Closely followed by cardiology with his last visit this morning with the [facility]-Dr. Sharrell Ku.  He does have some dyspnea on mild exertion but no pedal edema.  He endorses presence of 3 pillow orthopnea.  Endorses compliance with all his medications. Past Medical History:  Diagnosis Date   AICD (automatic cardioverter/defibrillator) present 07/03/2017   Anxiety    CHF (congestive heart failure) (HCC)    Essential hypertension    History of alcohol abuse    a. Sober since Feb 2016.   LV (left ventricular) mural thrombus 07/2014   Documented n [place]   NICM (nonischemic cardiomyopathy) (HCC)    a. 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    Past Surgical History:  Procedure Laterality Date   CARDIAC CATHETERIZATION  05/2014    In [place], clean cors   HERNIA REPAIR Right Inguinal   ICD IMPLANT N/A 07/03/2017   Procedure: ICD IMPLANT;  Surgeon: Marinus Maw, MD;  Location[facility] INVASIVE CV LAB;  Service: Cardiovascular;  Laterality: N/A;   KNEE SURGERY Right 1998    Family History  Problem Relation Age of Onset   Hypertension Mother    Hypertension Father    Heart attack Father 40       died   Hypertension Sister    Heart attack Paternal Grandfather 50       died    Allergies  Allergen Reactions   Celexa [Citalopram Hydrobromide] Other (See Comments)    Makes the patient disoriented   Effexor [Venlafaxine] Other (See Comments)    Altered patient's thinking process(confused)   Lisinopril Cough    Outpatient Medications Prior to Visit  Medication Sig Dispense Refill   allopurinol (ZYLOPRIM) 300 MG tablet Take 1 tablet (300 mg total) by mouth daily. 30 tablet 6   ALPRAZolam (XANAX) 0.5 MG tablet TK 1 T PO  TID FOR ANXIETY     apixaban (ELIQUIS) 5 MG TABS tablet Take 1 tablet (5 mg total) by mouth 2 (two) times daily. 60 tablet 6   atorvastatin (LIPITOR) 20 MG tablet TK 1 T PO  HS FOR LIPIDS     BIDIL 20-37.5 MG tablet Take 2 tablets by mouth in the morning and at bedtime. 360 tablet 3   Blood Pressure Monitoring (BLOOD PRESSURE CUFF) MISC 1 Device by Does not  apply route as needed. 1 each 0   carvedilol (COREG) 12.5 MG tablet Take 1 tablet (12.5 mg total) by mouth 2 (two) times daily with a meal. 60 tablet 6   dapagliflozin propanediol (FARXIGA) 10 MG TABS tablet Take 1 tablet (10 mg total) by mouth daily before breakfast. 90 tablet 3   digoxin (LANOXIN) 0.125 MG tablet Take 1 tablet (0.125 mg total) by mouth daily. 30 tablet 6   mexiletine (MEXITIL) 200 MG capsule Take 1 capsule (200 mg total) by mouth 2 (two) times daily. 60 capsule 6   sacubitril-valsartan (ENTRESTO) 24-26 MG Take 1 tablet by mouth 2 (two) times daily. 60 tablet 3   sertraline (ZOLOFT) 100 MG tablet Take 200 mg by mouth daily.      spironolactone (ALDACTONE) 25 MG tablet Take 1 tablet (25 mg total) by mouth daily. 30 tablet 6   torsemide (DEMADEX) 20 MG tablet Take 2 tablets (40 mg total) by mouth daily. 60 tablet 6   No facility-administered medications prior to visit.     ROS Review of Systems  Constitutional:  Negative for activity change and appetite change.  HENT:  Negative for sinus pressure and sore throat.   Eyes:  Negative for visual disturbance.  Respiratory:  Positive for shortness of breath. Negative for cough and chest tightness.   Cardiovascular:  Negative for chest pain and leg swelling.  Gastrointestinal:  Negative for abdominal distention, abdominal pain, constipation and diarrhea.  Endocrine: Negative.   Genitourinary:  Negative for dysuria.  Musculoskeletal:        See HPI  Skin:  Negative for rash.  Allergic/Immunologic: Negative.   Neurological:  Negative for weakness, light-headedness and numbness.  Psychiatric/Behavioral:  Negative for dysphoric mood and suicidal ideas.    Objective:  BP 128/90 (BP Location: Left Arm, Patient Position: Sitting, Cuff Size: Large)   Pulse 80   Ht 5\' 9"  (1.753 m)   Wt 223 lb (101.2 kg)   SpO2 96%   BMI 32.93 kg/m   BP/Weight 02/13/2021 02/13/2021 0000000  Systolic BP 0000000 [id number]  Diastolic BP 90 70 77  Wt. (Lbs) 223 224 214.6  BMI 32.93 33.08 31.69      Physical Exam Constitutional:      Appearance: He is well-developed.  Cardiovascular:     Rate and Rhythm: Normal rate.     Heart sounds: Normal heart sounds. No murmur heard. Pulmonary:     Effort: Pulmonary effort is normal.     Breath sounds: Normal breath sounds. No wheezing or rales.  Chest:     Chest wall: No tenderness.  Abdominal:     General: Bowel sounds are normal. There is no distension.     Palpations: Abdomen is soft. There is no mass.     Tenderness: There is no abdominal tenderness.  Musculoskeletal:        General: Normal range of motion.     Right lower leg: No  edema.     Left lower leg: No edema.  Neurological:     Mental Status: He is alert and oriented to person, place, and time.  Psychiatric:        Mood and Affect: Mood normal.    CMP Latest Ref Rng & Units 02/12/2021 01/29/2021 01/19/2021  Glucose 70 - 99 mg/dL 92 106(H) 98  BUN 6 - 20 mg/dL 28(H) 24(H) 14  Creatinine 0.61 - 1.24 mg/dL 1.43(H) 1.55(H) 1.01  Sodium 135 - 145 mmol/L 133(L) 138 137  Potassium 3.5 - 5.1  mmol/L 4.2 3.6 3.8  Chloride 98 - 111 mmol/L 102 103 101  CO2 22 - 32 mmol/L 21(L) 25 28  Calcium 8.9 - 10.3 mg/dL 9.4 9.4 9.7  Total Protein 6.5 - 8.1 g/dL - - 8.5(H)  Total Bilirubin 0.3 - 1.2 mg/dL - - 0.9  Alkaline Phos 38 - 126 U/L - - 95  AST 15 - 41 U/L - - 28  ALT 0 - 44 U/L - - 42    Lipid Panel     Component Value Date/Time   CHOL 262 (H) 07/21/2012 1026   TRIG 163 (H) 07/21/2012 1026   HDL 57 07/21/2012 1026   CHOLHDL 4.6 07/21/2012 1026   VLDL 33 07/21/2012 1026   LDLCALC 172 (H) 07/21/2012 1026    CBC    Component Value Date/Time   WBC 8.9 01/19/2021 1213   RBC 4.84 01/19/2021 1213   HGB 14.2 01/19/2021 1213   HGB 12.8 (L) 06/26/2017 0946   HCT 41.5 01/19/2021 1213   HCT 37.2 (L) 06/26/2017 0946   PLT 270 01/19/2021 1213   PLT 310 06/26/2017 0946   MCV 85.7 01/19/2021 1213   MCV 91 06/26/2017 0946   MCH 29.3 01/19/2021 1213   MCHC 34.2 01/19/2021 1213   RDW 13.2 01/19/2021 1213   RDW 13.0 06/26/2017 0946   LYMPHSABS 2.1 06/26/2017 0946   MONOABS 0.8 11/09/2014 0820   EOSABS 0.2 06/26/2017 0946   BASOSABS 0.0 06/26/2017 0946    Lab Results  Component Value Date   HGBA1C 4.5 (L) 01/19/2021    Assessment & Plan:  1. NICM (nonischemic cardiomyopathy) ([place]) EF of 10 to 15% Euvolemic Continue guideline directed medical therapy Follow-up with cardiology  2. AICD (automatic cardioverter/defibrillator) present Stable Follow-up with A. fib clinic  3. LV (left ventricular) mural thrombus without MI ([place]) Currently on chronic  anticoagulation with Eliquis  4. Anxiety and depression Elevated PHQ-9 score of 23 He is currently on Zoloft Would love to switch him from Zoloft to Cymbalta given analgesic benefit of the latter Advised him to discuss this with his [facility] at [facility] [place]  5. Phantom pain ([place]) Will initiate gabapentin-discussed sedating side effects - gabapentin (NEURONTIN) 300 MG capsule; Take 1 capsule (300 mg total) by mouth 2 (two) times daily.  Dispense: 60 capsule; Refill: 3  6. History of transmetatarsal amputation of foot ([place]) He does have underlying phantom pain Fall precautions  7. Screening for colon cancer - Cologuard    Meds ordered this encounter  Medications   gabapentin (NEURONTIN) 300 MG capsule    Sig: Take 1 capsule (300 mg total) by mouth 2 (two) times daily.    Dispense:  60 capsule    Refill:  3    Follow-up: Return in about 3 months (around 05/16/2021) for Chronic medical conditions.       Charlott Rakes, MD, FAAFP. [facility] [place], [place]   02/13/2021, 1:08 PM

## 2021-02-13 NOTE — Patient Instructions (Addendum)
Medication Instructions:  Your physician recommends that you continue on your current medications as directed. Please refer to the Current Medication list given to you today.  Labwork: None ordered.  Testing/Procedures: None ordered.  Follow-Up: Your physician wants you to follow-up in: one year with   Casimiro Needle "Mardelle Matte" Lanna Poche, PA-C  Remote monitoring is used to monitor your ICD from home. This monitoring reduces the number of office visits required to check your device to one time per year. It allows [place] to keep an eye on the functioning of your device to ensure it is working properly. You are scheduled for a device check from home on 05/15/2021. You may send your transmission at any time that day. If you have a wireless device, the transmission will be sent automatically. After your physician reviews your transmission, you will receive a postcard with your next transmission date.   Any Other Special Instructions Will Be Listed Below (If Applicable).  If you need a refill on your cardiac medications before your next appointment, please call your pharmacy.

## 2021-02-13 NOTE — Patient Instructions (Signed)
Phantom Limb Pain Phantom limb pain is pain in a body part that no longer exists. It usually happens in an arm or leg after it has been surgically removed (amputated). Most cases of phantom limb pain are brief. However, it can last for years, and it may be severe and disabling. The exact mechanism of how phantom limb pain occurs is not known. The problem may start in a part of the brain that processes feelings and awareness (sensations) from the rest of the body (sensory cortex). When a body part is lost, the sensory cortex may not be able to handle the loss and may reorganize (rewire) itself to make up for the lost signals. What are the causes? This condition only happens in patients with amputations, but the cause is not known. It may be caused by: Damaged nerve endings. Scar tissue. Rewiring of nerves in the brain or spine. What are the signs or symptoms? Symptoms vary and are related to the lost limb or the remaining stump. Stump pain may be mistaken for phantom limb pain, or you may have both at the same time. Symptoms include: Phantom pain. Pain often feels like the pain you had before the amputation. It usually comes and goes, and it gets better over time. The pain may feel like: Burning. Stabbing. Throbbing. Cramping. Prickling. Crushing. Telescoping. This is when the pain moves over time from the farthest part of the amputated limb (fingers or toes) up to the site of amputation, as if the limb is shrinking. Phantom sensation. This is a feeling other than pain, as if the limb is still part of the body. Physical or emotional factors can trigger or worsen pain sensations. Those factors may include: Weather changes. Stress. Strong emotions. Certain positions or movements of the body. Pressure on the affected area. How is this diagnosed? Diagnosis is based on your history of amputation and symptoms that you have after surgery. Your health care provider may: Do a physical exam. Talk  with you about your symptoms and past history of pain. You may have imaging tests to examine your stump, such as X-rays or CT scan. How is this treated? There are different therapies and medicines that may give you relief. Treatment options may include: Pain medicine. Medicine can be given for pain right after surgery (acute pain) and for pain that goes on for some time (chronic pain). Commonly used medicines include: Antidepressant medicine. Anticonvulsant medicine. Narcotics, analgesics, or anti-inflammatory medicine. Nerve blocks. Techniques that help to retrain the brain and nervous system (movement representation techniques), such as: Looking at your unaffected limb in a mirror and thinking about painless movement of your extremity (mirror therapy). Thinking about moving your limbs without actual movement (motor therapy). Watching and sensing the movement of other people (action observation). Attaching a myoelectric sensor on the stump to detect the muscle potential and predict the motion the person wants to perform (virtual reality). Sensory discrimination training. For this treatment, painless stimulation is applied to different parts of your stump and you describe what you feel. This may help with nerve rewiring. Physical therapy involving the stump, which may include: Exercise. This may be physical movement, or it may involve applying sound waves (ultrasound) or tapping (percussion therapy) to the stump. These exercises may help to heal and retrain tissue and nerves. Massage. Stump massage creates new sensations, breaks up scar tissue, and prepares the stump for an artificial limb (prosthesis). Heat or cold treatment. This can improve blood flow and reduce inflammation. Applying painless electrical   pulses to the skin to prevent sensations of pain from reaching the brain (transcutaneous electrical nerve stimulation, TENS). Complementary therapies, such as: Acupuncture. Relaxation  techniques. These often involve hypnosis, guided imagery, deep breathing, and muscle relaxation exercises. Biofeedback. This involves using monitors that alert you to changes in your breathing, heart rate, skin temperature, or muscle activity, and using relaxation techniques to reverse those changes. Biofeedback tells you if the techniques you are using are effective. Follow these instructions at home:  Take over-the-counter and prescription medicines only as told by your health care provider. Ask your health care provider if the medicine prescribed to you requires you to avoid driving or using machinery. Do not use any products that contain nicotine or tobacco. These products include cigarettes, chewing tobacco, and vaping devices, such as e-cigarettes. If you need help quitting, ask your health care provider. Join a support group. Express your feelings and talk with someone you trust. Seek counseling or talk therapy with a mental health professional. This may be helpful if you are having trouble managing your emotions about the amputation. Keep all follow-up visits. This is important. Contact a health care provider if: You have a sore on your stump that does not get better with treatment. Your pain does not improve with medicine or treatment. Get help right away if: You have suicidal thoughts. If you ever feel like you may hurt yourself or others, or have thoughts about taking your own life, get help right away. Go to your nearest emergency department or: Call your local emergency services (911 in the [place] Call a suicide crisis helpline, such as the National Suicide Prevention Lifeline at 1-800-273-8255 or 988 in the [place] This is open 24 hours a day in the [place] Text the Crisis Text Line at 741741 (in the [place] Summary Phantom limb pain is pain in a body part that no longer exists. It happens in an arm or leg (extremity) after it has been surgically removed (amputated). Medicines or  techniques that help to retrain the brain and nervous system (movement representation techniques) may help to relieve symptoms. Physical therapy for phantom limb pain may involve exercise, massage, heat or cold therapy, or painless stimulation of the skin (transcutaneous electrical nerve stimulation, TENS). This information is not intended to replace advice given to you by your health care provider. Make sure you discuss any questions you have with your health care provider. Document Revised: 10/11/2020 Document Reviewed: 08/03/2020 Elsevier Patient Education  2022 Elsevier Inc.  

## 2021-02-13 NOTE — Progress Notes (Signed)
HPI Robert Booth returns today for ICD followup. I have not seen him in over 3 years. He is a pleasant middle aged man with chronic systolic heart failure, EF 15%. He underwent ICD insertion for primary prevention over 3 years ago. His has class 2-3 symptoms.  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 Allergies  Allergen Reactions   Celexa [Citalopram Hydrobromide] Other (See Comments)    Makes the patient disoriented   Effexor [Venlafaxine] Other (See Comments)    Altered patient's thinking process(confused)   Lisinopril Cough     Current Outpatient Medications  Medication Sig Dispense Refill   allopurinol (ZYLOPRIM) 300 MG tablet Take 1 tablet (300 mg total) by mouth daily. 30 tablet 6   ALPRAZolam (XANAX) 0.5 MG tablet TK 1 T PO  TID FOR ANXIETY     apixaban (ELIQUIS) 5 MG TABS tablet Take 1 tablet (5 mg total) by mouth 2 (two) times daily. 60 tablet 6   atorvastatin (LIPITOR) 20 MG tablet TK 1 T PO  HS FOR LIPIDS     BIDIL 20-37.5 MG tablet Take 2 tablets by mouth in the morning and at bedtime. 360 tablet 3   Blood Pressure Monitoring (BLOOD PRESSURE CUFF) MISC 1 Device by Does not apply route as needed. 1 each 0   carvedilol (COREG) 12.5 MG tablet Take 1 tablet (12.5 mg total) by mouth 2 (two) times daily with a meal. 60 tablet 6   dapagliflozin propanediol (FARXIGA) 10 MG TABS tablet Take 1 tablet (10 mg total) by mouth daily before breakfast. 90 tablet 3   digoxin (LANOXIN) 0.125 MG tablet Take 1 tablet (0.125 mg total) by mouth daily. 30 tablet 6   mexiletine (MEXITIL) 200 MG capsule Take 1 capsule (200 mg total) by mouth 2 (two) times daily. 60 capsule 6   sacubitril-valsartan (ENTRESTO) 24-26 MG Take 1 tablet by mouth 2 (two) times daily. 60 tablet 3   spironolactone (ALDACTONE) 25 MG tablet Take 1 tablet (25 mg total) by mouth daily. 30 tablet 6   torsemide (DEMADEX) 20 MG tablet Take 2 tablets (40 mg total) by  mouth daily. 60 tablet 6   No current facility-administered medications for this visit.     Past Medical History:  Diagnosis Date   AICD (automatic cardioverter/defibrillator) present 07/03/2017   Anxiety    CHF (congestive heart failure) (HCC)    Essential hypertension    History of alcohol abuse    a. Sober since Feb 2016.   LV (left ventricular) mural thrombus 07/2014   Documented n [place]   NICM (nonischemic cardiomyopathy) (HCC)    a. 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    ROS:   All systems reviewed and negative except as noted in the HPI.   Past Surgical History:  Procedure Laterality Date   CARDIAC CATHETERIZATION  05/2014   In [place], clean cors   HERNIA REPAIR Right Inguinal   ICD IMPLANT N/A 07/03/2017   Procedure: ICD IMPLANT;  Surgeon: Marinus Maw, MD;  Location[facility] INVASIVE CV LAB;  Service: Cardiovascular;  Laterality: N/A;   KNEE SURGERY Right 1998     Family History  Problem Relation Age of Onset   Hypertension Mother    Hypertension Father  Heart attack Father 52       died   Hypertension Sister    Heart attack Paternal Grandfather 50       died     Social History   Socioeconomic History   Marital status: Single    Spouse name: Not on file   Number of children: 6   Years of education: Not on file   Highest education level: Not on file  Occupational History   Occupation: Unemployed  Tobacco Use   Smoking status: Former    Types: Cigarettes   Smokeless tobacco: Former    Types: Chew   Tobacco comments:    Was a rare smoker  Building services engineer Use: Never used  Substance and Sexual Activity   Alcohol use: No    Alcohol/week: 0.0 standard drinks   Drug use: No   Sexual activity: Yes  Other Topics Concern   Not on file  Social History  Narrative   Lives with mom.     Social Determinants of Health   Financial Resource Strain: Not on file  Food Insecurity: Not on file  Transportation Needs: No Transportation Needs   Lack of Transportation (Medical): No   Lack of Transportation (Non-Medical): No  Physical Activity: Not on file  Stress: Not on file  Social Connections: Not on file  Intimate Partner Violence: Not on file     Ht 5\' 9"  (1.753 m)   Wt 224 lb (101.6 kg)   BMI 33.08 kg/m   Physical Exam:  obese appearing middle aged man, NAD HEENT: Unremarkable Neck:  No JVD, no thyromegally Lymphatics:  No adenopathy Back:  No CVA tenderness Lungs:  Clear with no wheezes HEART:  Regular rate rhythm, no murmurs, no rubs, no clicks Abd:  soft, positive bowel sounds, no organomegally, no rebound, no guarding Ext:  2 plus pulses, no edema, no cyanosis, no clubbing Skin:  No rashes no nodules Neuro:  CN II through XII intact, motor grossly intact  EKG - nsr  DEVICE  Normal device function.  See PaceArt for details.   Assess/Plan:  1. Chronic systolic heart failure - his symptoms are class 2. He will continue his current meds. He has run out of his entresto and we will refill. 2. VT - he is minimally symptomatic. No indication for medical therapy at this time. He is non-sustained 3. 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  4. HTN - his blood pressure is well controlled. No change in his meds.   .D.

## 2021-02-14 ENCOUNTER — Telehealth: Payer: Self-pay | Admitting: Family Medicine

## 2021-02-14 NOTE — Telephone Encounter (Signed)
Done

## 2021-02-14 NOTE — Telephone Encounter (Signed)
Requesting form be changed.

## 2021-02-14 NOTE — Telephone Encounter (Unsigned)
Copied from CRM [id number]. Topic: General - Other >> Feb 14, 2021  3:41 PM Wyonia Hough E wrote: Reason for CRM: Pt stated he was advised that the [facility] didn't meet the criteria to get assistance/ they advised him to have PCP change the form / the recommended level of care was entered by provider as HOME/ but due to pts health they are recommending the provider chose rest home or [facility] / please advise

## 2021-02-15 ENCOUNTER — Other Ambulatory Visit (HOSPITAL_COMMUNITY): Payer: Self-pay | Admitting: *Deleted

## 2021-02-15 ENCOUNTER — Ambulatory Visit (INDEPENDENT_AMBULATORY_CARE_PROVIDER_SITE_OTHER): Payer: Medicaid Other

## 2021-02-15 DIAGNOSIS — I428 Other cardiomyopathies: Secondary | ICD-10-CM

## 2021-02-15 LAB — CUP PACEART REMOTE DEVICE CHECK
Battery Remaining Longevity: 138 mo
Battery Remaining Percentage: 97 %
Brady Statistic RV Percent Paced: 0 %
Date Time Interrogation Session: 20221116140700
HighPow Impedance: 81 Ohm
Implantable Lead Implant Date: 20190404
Implantable Lead Location: 753860
Implantable Lead Model: 292
Implantable Lead Serial Number: 446269
Implantable Pulse Generator Implant Date: 20190404
Lead Channel Impedance Value: 449 Ohm
Lead Channel Setting Pacing Amplitude: 2 V
Lead Channel Setting Pacing Pulse Width: 0.4 ms
Lead Channel Setting Sensing Sensitivity: 0.5 mV
Pulse Gen Serial Number: 244129

## 2021-02-15 MED ORDER — CARVEDILOL 12.5 MG PO TABS: 12.5000 mg | ORAL_TABLET | Freq: Two times a day (BID) | ORAL | 6 refills | Status: DC

## 2021-02-15 MED ORDER — ATORVASTATIN CALCIUM 20 MG PO TABS: 20.0000 mg | ORAL_TABLET | Freq: Every day | ORAL | 3 refills | Status: DC

## 2021-02-15 NOTE — Telephone Encounter (Signed)
Form has been faxed to Ron neal with corrections.

## 2021-02-16 NOTE — Progress Notes (Signed)
Advanced Heart Failure Clinic Note  Date:  02/19/2021   ID:  Robert Booth, DOB 1971-01-16, MRN [id number]  Location: Home  Provider location: [place] [facility] Type of Visit: Established patient  PCP:  Hoy Register, MD  Psych: Dartha Lodge, NP [facility] EP: Dr. Ladona Ridgel HF Cardiologist: Dr. Gala Romney  Chief Complaint: Heart Failure follow-up   History of Present Illness: Robert Booth is a 50 y.o.male with hx of systolic HF with NICM secondary to prolonged ETOH abuse, EF 15% and CKD stage 2.  EF improved to 35-40% in 7/16 then stopped taking meds and EF back down   In 8/16, he presented to [facility] with hemoptysis. Found to have RLL PNA and cardiogenic shock. Treated briefly with milrinone and levophed. Diuresed well. Discharge weight 177 .    Hospitalized 11/2016 with recurrent A/C systolic heart failure in the setting of medication noncompliance. He had been out of medications for 4 months. Echo EF 10-15%. Diuresed with IV lasix. HF meds restarted. D/c weight 177 lbs   Echo 04/2017 EF ~20%. RV moderately dilated. Pt underwent ICD placement 07/03/17 - [employer].   Admitted to ED 07/08/17 with ICD shock. Had VT with ATP x 2 and shock x 1. Electrolytes stable. No clear inciting cause.   In 2/20 presented to OSH with CP. Troponins negative. Echo EF 15-20%    Echo 12/2018 EF: 15% RV moderately HK No LV thrombus.   CPX 11/20 showed mild limitations due by body habitus and deconditioning. No clear HF limitation.  Per chart review, Echo (2/21) at [facility] showed EF <20%, severe global HK, grade 3 DD, mild/moderate MR, mild PR. He followed with Dr. Clois Comber [facility] ICD checks as prison [facility] not offer remote transmissions.  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  Incarcerated for past 20 months and released 12/2020. 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   Today he returns for HF follow up.  Overall feeling fine. He remains SOB with minimal activity and ADLs. Does OK if he takes frequent rest breaks with ADLs. Uses his rolling walker to get around. Denies CP, dizziness, edema. Sleeps on 3 pillows. Appetite ok. No fever or chills. Weight at home 224-226 pounds. Taking all medications. Denies further ETOH and tobacco use. Staying with his sister  CPX done 11/20  Peak VO2: 22.2 (69% predicted peak VO2) - corrects 25.6 for ibw VE/VCO2 slope:  26  OUES: 2.57  Peak RER: 1.07   Previous studies: Echo 7/16 EF 20% Echo 12/16 EF 35 - 40% No LV clot Echo 8/17 EF 35% Echo 9/18 EF 10-15%. Echo 1/19 EF 15-20% Echo 10/20 EF 15% RV moderately HK No LV thrombus.  Echo (2/21) at [facility], EF <20%, severe global HK, grade 3 DD, mild/moderate MR, mild PR.   CMRI 05/20/2017  1. Mild to moderately dilated LV with EF 19%. Diffuse hypokinesis with septal-lateral dyssynchrony. There is a small thinned area of the basal inferoseptal wall, there does not appear to be an actual VSD. No LV thrombus noted.  2.  Normal RV size with mildly decreased systolic function.  3. Noncoronary LGE pattern as noted above. This could be suggestive of prior myocarditis, less likely cardiac sarcoidosis.  Past Medical History:  Diagnosis Date   AICD (automatic cardioverter/defibrillator) present 07/03/2017   Anxiety    CHF (congestive heart failure) (HCC)    Essential hypertension    History of alcohol abuse  a. Sober since Feb 2016.   LV (left ventricular) mural thrombus 07/2014   Documented n [place]   NICM (nonischemic cardiomyopathy) ([place])    a. 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   Past Surgical History:  Procedure Laterality Date   CARDIAC CATHETERIZATION  05/2014   In [place], clean cors   HERNIA REPAIR Right Inguinal    ICD IMPLANT N/A 07/03/2017   Procedure: ICD IMPLANT;  Surgeon: Evans Lance, MD;  Location: [place] CV LAB;  Service: Cardiovascular;  Laterality: N/A;   KNEE SURGERY Right 1998   Current Outpatient Medications  Medication Sig Dispense Refill   ALPRAZolam (XANAX) 0.5 MG tablet As needed     apixaban (ELIQUIS) 5 MG TABS tablet Take 1 tablet (5 mg total) by mouth 2 (two) times daily. 60 tablet 6   atorvastatin (LIPITOR) 20 MG tablet Take 1 tablet (20 mg total) by mouth daily. 30 tablet 3   BIDIL 20-37.5 MG tablet Take 2 tablets by mouth in the morning and at bedtime. 360 tablet 3   Blood Pressure Monitoring (BLOOD PRESSURE CUFF) MISC 1 Device by Does not apply route as needed. 1 each 0   carvedilol (COREG) 12.5 MG tablet Take 1 tablet (12.5 mg total) by mouth 2 (two) times daily with a meal. 60 tablet 6   dapagliflozin propanediol (FARXIGA) 10 MG TABS tablet Take 1 tablet (10 mg total) by mouth daily before breakfast. 90 tablet 3   digoxin (LANOXIN) 0.125 MG tablet Take 1 tablet (0.125 mg total) by mouth daily. 30 tablet 6   gabapentin (NEURONTIN) 300 MG capsule Take 1 capsule (300 mg total) by mouth 2 (two) times daily. 60 capsule 3   mexiletine (MEXITIL) 200 MG capsule Take 1 capsule (200 mg total) by mouth 2 (two) times daily. 60 capsule 6   sacubitril-valsartan (ENTRESTO) 24-26 MG Take 1 tablet by mouth 2 (two) times daily. 60 tablet 3   sertraline (ZOLOFT) 100 MG tablet Take 200 mg by mouth daily.     spironolactone (ALDACTONE) 25 MG tablet Take 1 tablet (25 mg total) by mouth daily. 30 tablet 6   torsemide (DEMADEX) 20 MG tablet Take 2 tablets (40 mg total) by mouth daily. 60 tablet 6   allopurinol (ZYLOPRIM) 300 MG tablet Take 1 tablet (300 mg total) by mouth daily. (Patient not taking: Reported on 02/19/2021) 30 tablet 6   No current facility-administered medications for this encounter.   Allergies:   Celexa [citalopram hydrobromide], Effexor [venlafaxine], and Lisinopril    Social History:  The patient  reports that he has quit smoking. His smoking use included cigarettes. He has quit using smokeless tobacco.  His smokeless tobacco use included chew. He reports that he does not drink alcohol and does not use drugs.   Family History:  The patient's family history includes Heart attack (age of onset: 73) in his paternal grandfather; Heart attack (age of onset: 29) in his father; Hypertension in his father, mother, and sister.   ROS:  Please see the history of present illness.   All other systems are personally reviewed and negative.   Recent Labs: 01/19/2021: ALT 42; Hemoglobin 14.2; Platelets 270 02/12/2021: BUN 28; Creatinine, Ser 1.43; Potassium 4.2; Sodium 133  Personally reviewed   Wt Readings from Last 3 Encounters:  02/19/21 102.5 kg (226 lb)  02/13/21  101.2 kg (223 lb)  02/13/21 101.6 kg (224 lb)    BP 100/60   Pulse 98   Wt 102.5 kg (226 lb)   SpO2 96%   BMI 33.37 kg/m   Physical Exam: General:  NAD. No resp difficulty, walked into clinic with RW HEENT: Normal Neck: Supple. No JVD. Carotids 2+ bilat; no bruits. No lymphadenopathy or thryomegaly appreciated. Cor: PMI nondisplaced. Regular rate & rhythm. No rubs, gallops or murmurs. Lungs: Clear Abdomen: Soft, nontender, nondistended. No hepatosplenomegaly. No bruits or masses. Good bowel sounds. Extremities: No cyanosis, clubbing, rash, edema Neuro: Alert & oriented x 3, cranial nerves grossly intact. Moves all 4 extremities w/o difficulty. Affect pleasant.  Device Interrogation:  HL Score 5, no VT, no shocks, average HR 84, 1.8 hours activity (personally reviewed).  Assessment/Plan: 1. Chronic systolic HF - NICM  - Cath ok performed [place] 2016. 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  - Echo 10/2016 EF 10-15%. LV dysfunction after stopping HF meds. - Echo 04/16/2017 EF 15-20% - cMRI 2/19 EF 19%. RV mildly decreased .  - s/p Bost Sci ICD 07/03/17. Had ICD shock with VT 07/08/17 - Echo 10/20 EF:  15% with moderate RV dysfunction - CPX 11/20 with mild functional limitation; no clear HF limitation despite EF<20% - NYHA II-III, confounded by physical deconditioning and amputated toes. Volume looks good today. - I think he can switch torsemide to 20 mg prn weight gain/edema. - Continue Farxiga 10 mg daily. - Continue Entresto 24/26 mg bid. - Continue digoxin 0.125 mg daily.  - Continue spironolactone 25 mg daily.  - Continue carvedilol 12.5 mg bid. - Continue BiDil 2 tabs bid (unable to take tid). - Reinforced fluid restriction to < 2 L daily, sodium restriction to less than 2000 mg daily, and the importance of daily weights.  - ? Candidacy for advanced therapies if he can demonstrate compliance with medications and abstinence from ETOH. - BMET & digoxin level today.   2. LV mural thrombus  - He has been off warfarin and is now on Eliquis. - No thrombus on echo 2/21. - No bleeding issues. - Recent CBC stable.   3. H/o ETOH abuse - Previously drinking 1/5 liquor daily.  - Quit ETOH ~ 2 years ago. - Congratulated on cessation. Needs to remain quit.    4. VT 07/08/17  - He is s/p [employer] Sci ICD 07/03/17. - Continue mexiletine 200 mg bid.  - No change. No VT on device interrogation. - He has established EP care w/ Dr. Lovena Le.   5. Gout - Continue allopurinol 300 mg daily. Rx sent to [facility]. - No recent flares. Previously referred to Rheum.   6. 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 - He is not diabetic but has significant phantom pain, on gabapentin per PCP. - Has follow up with Podiatry.  7. Anxiety/Depression - Follows with [facility]. - Continue sertraline. Consider switching to Cymbalta for added pain management.  8. SDOH - He has applied for disability and he has Medicaid. - He lives with his sister, but has transportation needs. - He has a scale at home.  Will update echo and follow up with Dr. Haroldine Laws as scheduled in  2 months as scheduled.  Frankey Poot, FNP  02/19/2021 8:45 AM  Advanced Heart Failure [place] [address] Heart and Vascular [place] [address] [phone] (office) [phone] (fax)

## 2021-02-19 ENCOUNTER — Ambulatory Visit (HOSPITAL_COMMUNITY)
Admission: RE | Admit: 2021-02-19 | Discharge: 2021-02-19 | Disposition: A | Discharge status: Home / Self Care | Payer: Medicaid Other | Source: Ambulatory Visit | Attending: Cardiology | Admitting: Cardiology

## 2021-02-19 ENCOUNTER — Encounter (HOSPITAL_COMMUNITY): Payer: Self-pay

## 2021-02-19 VITALS — BP 100/60 | HR 98 | Wt 226.0 lb

## 2021-02-19 DIAGNOSIS — I428 Other cardiomyopathies: Secondary | ICD-10-CM | POA: Diagnosis not present

## 2021-02-19 DIAGNOSIS — Z5982 Transportation insecurity: Secondary | ICD-10-CM | POA: Diagnosis not present

## 2021-02-19 DIAGNOSIS — Z79899 Other long term (current) drug therapy: Secondary | ICD-10-CM | POA: Insufficient documentation

## 2021-02-19 DIAGNOSIS — Z7984 Long term (current) use of oral hypoglycemic drugs: Secondary | ICD-10-CM | POA: Insufficient documentation

## 2021-02-19 DIAGNOSIS — I5022 Chronic systolic (congestive) heart failure: Secondary | ICD-10-CM

## 2021-02-19 DIAGNOSIS — Z89432 Acquired absence of left foot: Secondary | ICD-10-CM | POA: Insufficient documentation

## 2021-02-19 DIAGNOSIS — Z733 Stress, not elsewhere classified: Secondary | ICD-10-CM | POA: Insufficient documentation

## 2021-02-19 DIAGNOSIS — F1011 Alcohol abuse, in remission: Secondary | ICD-10-CM | POA: Diagnosis not present

## 2021-02-19 DIAGNOSIS — Z597 Insufficient social insurance and welfare support: Secondary | ICD-10-CM | POA: Insufficient documentation

## 2021-02-19 DIAGNOSIS — I513 Intracardiac thrombosis, not elsewhere classified: Secondary | ICD-10-CM | POA: Diagnosis not present

## 2021-02-19 DIAGNOSIS — G546 Phantom limb syndrome with pain: Secondary | ICD-10-CM | POA: Insufficient documentation

## 2021-02-19 DIAGNOSIS — Z89431 Acquired absence of right foot: Secondary | ICD-10-CM | POA: Diagnosis not present

## 2021-02-19 DIAGNOSIS — I739 Peripheral vascular disease, unspecified: Secondary | ICD-10-CM

## 2021-02-19 DIAGNOSIS — Z7901 Long term (current) use of anticoagulants: Secondary | ICD-10-CM | POA: Diagnosis not present

## 2021-02-19 DIAGNOSIS — F32A Depression, unspecified: Secondary | ICD-10-CM | POA: Diagnosis not present

## 2021-02-19 DIAGNOSIS — I130 Hypertensive heart and chronic kidney disease with heart failure and stage 1 through stage 4 chronic kidney disease, or unspecified chronic kidney disease: Secondary | ICD-10-CM | POA: Diagnosis present

## 2021-02-19 DIAGNOSIS — F419 Anxiety disorder, unspecified: Secondary | ICD-10-CM

## 2021-02-19 DIAGNOSIS — Z95810 Presence of automatic (implantable) cardiac defibrillator: Secondary | ICD-10-CM

## 2021-02-19 DIAGNOSIS — M1000 Idiopathic gout, unspecified site: Secondary | ICD-10-CM

## 2021-02-19 DIAGNOSIS — I472 Ventricular tachycardia, unspecified: Secondary | ICD-10-CM | POA: Diagnosis not present

## 2021-02-19 DIAGNOSIS — M109 Gout, unspecified: Secondary | ICD-10-CM | POA: Insufficient documentation

## 2021-02-19 DIAGNOSIS — N182 Chronic kidney disease, stage 2 (mild): Secondary | ICD-10-CM | POA: Diagnosis not present

## 2021-02-19 LAB — BASIC METABOLIC PANEL
Anion gap: 10 (ref 5–15)
BUN: 18 mg/dL (ref 6–20)
CO2: 26 mmol/L (ref 22–32)
Calcium: 9.3 mg/dL (ref 8.9–10.3)
Chloride: 101 mmol/L (ref 98–111)
Creatinine, Ser: 1.44 mg/dL — ABNORMAL HIGH (ref 0.61–1.24)
GFR, Estimated: 59 mL/min — ABNORMAL LOW (ref >60)
Glucose, Bld: 95 mg/dL (ref 70–99)
Potassium: 4.2 mmol/L (ref 3.5–5.1)
Sodium: 137 mmol/L (ref 135–145)

## 2021-02-19 LAB — DIGOXIN LEVEL: Digoxin Level: 0.4 ng/mL — ABNORMAL LOW (ref 0.8–2.0)

## 2021-02-19 MED ORDER — ALLOPURINOL 300 MG PO TABS: 300.0000 mg | ORAL_TABLET | Freq: Every day | ORAL | 2 refills | Status: DC

## 2021-02-19 MED ORDER — TORSEMIDE 20 MG PO TABS: 20.0000 mg | ORAL_TABLET | ORAL | 3 refills | Status: DC | PRN

## 2021-02-19 NOTE — Patient Instructions (Addendum)
Labs were done today, if any labs are abnormal the clinic will call you  Your physician recommends that you schedule a follow-up appointment in: please keep follow up as scheduled  You will be scheduled for a echocardiogram  CHANGE Torsemide to 20 mg 1 tablet as needed daily for 3 lb weight gain in 24 hours or 5 lb weight gain in a week  At the Advanced Heart Failure Clinic, you and your health needs are our priority. As part of our continuing mission to provide you with exceptional heart care, we have created designated Provider Care Teams. These Care Teams include your primary Cardiologist (physician) and Advanced Practice Providers (APPs- Physician Assistants and Nurse Practitioners) who all work together to provide you with the care you need, when you need it.   You may see any of the following providers on your designated Care Team at your next follow up: Dr Arvilla Meres Dr Carron Curie, NP Robbie Lis, [place] [facility] [place], [place] Karle Plumber, PharmD   Please be sure to bring in all your medications bottles to every appointment.    If you have any questions or concerns before your next appointment please send [place] a message through [place] or call our office at [phone].    TO LEAVE A MESSAGE FOR THE NURSE SELECT OPTION 2, PLEASE LEAVE A MESSAGE INCLUDING: YOUR NAME DATE OF BIRTH CALL BACK NUMBER REASON FOR CALL**this is important as we prioritize the call backs  YOU WILL RECEIVE A CALL BACK THE SAME DAY AS LONG AS YOU CALL BEFORE 4:00 PM

## 2021-02-22 LAB — COLOGUARD: COLOGUARD: NEGATIVE

## 2021-02-26 ENCOUNTER — Telehealth (HOSPITAL_COMMUNITY): Payer: Self-pay | Admitting: *Deleted

## 2021-02-26 ENCOUNTER — Other Ambulatory Visit (HOSPITAL_COMMUNITY): Payer: Self-pay | Admitting: *Deleted

## 2021-02-26 MED ORDER — HYDRALAZINE HCL 25 MG PO TABS: 25.0000 mg | ORAL_TABLET | Freq: Three times a day (TID) | ORAL | 3 refills | Status: DC

## 2021-02-26 NOTE — Telephone Encounter (Signed)
Pt left vm stating bidil was still giving him headaches and he was told to let Shanda Bumps [place],NP know and she would switch his medication.  Routed to [place] for advice

## 2021-02-26 NOTE — Telephone Encounter (Signed)
Pt said the headaches have been ongoing and he would like to switch medications. Tylenol does not help his headaches.

## 2021-02-26 NOTE — Progress Notes (Signed)
Remote ICD transmission.   

## 2021-02-27 ENCOUNTER — Other Ambulatory Visit (HOSPITAL_COMMUNITY): Payer: Self-pay | Admitting: *Deleted

## 2021-02-27 MED ORDER — ENTRESTO 24-26 MG PO TABS: 1.0000 | ORAL_TABLET | Freq: Two times a day (BID) | ORAL | 3 refills | Status: DC

## 2021-02-27 MED ORDER — APIXABAN 5 MG PO TABS: 5.0000 mg | ORAL_TABLET | Freq: Two times a day (BID) | ORAL | 6 refills | Status: AC

## 2021-02-27 MED ORDER — DIGOXIN 125 MCG PO TABS: 0.1250 mg | ORAL_TABLET | Freq: Every day | ORAL | 6 refills | Status: DC

## 2021-02-28 ENCOUNTER — Telehealth: Payer: Self-pay | Admitting: Podiatry

## 2021-03-01 ENCOUNTER — Other Ambulatory Visit: Payer: Self-pay | Admitting: Podiatry

## 2021-03-01 ENCOUNTER — Other Ambulatory Visit: Payer: Self-pay

## 2021-03-01 ENCOUNTER — Ambulatory Visit (HOSPITAL_COMMUNITY)
Admission: RE | Admit: 2021-03-01 | Discharge: 2021-03-01 | Disposition: A | Discharge status: Home / Self Care | Payer: Medicaid Other | Source: Ambulatory Visit | Attending: Family Medicine | Admitting: Family Medicine

## 2021-03-01 DIAGNOSIS — I110 Hypertensive heart disease with heart failure: Secondary | ICD-10-CM | POA: Diagnosis not present

## 2021-03-01 DIAGNOSIS — I340 Nonrheumatic mitral (valve) insufficiency: Secondary | ICD-10-CM | POA: Diagnosis not present

## 2021-03-01 DIAGNOSIS — I5022 Chronic systolic (congestive) heart failure: Secondary | ICD-10-CM | POA: Insufficient documentation

## 2021-03-01 DIAGNOSIS — Z89439 Acquired absence of unspecified foot: Secondary | ICD-10-CM

## 2021-03-01 DIAGNOSIS — I428 Other cardiomyopathies: Secondary | ICD-10-CM | POA: Diagnosis not present

## 2021-03-01 DIAGNOSIS — G621 Alcoholic polyneuropathy: Secondary | ICD-10-CM

## 2021-03-01 DIAGNOSIS — Z95810 Presence of automatic (implantable) cardiac defibrillator: Secondary | ICD-10-CM | POA: Diagnosis not present

## 2021-03-01 LAB — ECHOCARDIOGRAM COMPLETE
Area-P 1/2: 3.21 cm2
Calc EF: 23.3 %
S' Lateral: 5.8 cm
Single Plane A2C EF: 26.7 %
Single Plane A4C EF: 30.9 %

## 2021-03-01 MED ORDER — PERFLUTREN LIPID MICROSPHERE
1.0000 mL | INTRAVENOUS | Status: AC | PRN
  Administered 2021-03-01: 2 mL via INTRAVENOUS
  Filled 2021-03-01: qty 10

## 2021-03-01 NOTE — Telephone Encounter (Signed)
error 

## 2021-03-05 ENCOUNTER — Other Ambulatory Visit (HOSPITAL_COMMUNITY): Payer: Medicaid Other

## 2021-03-06 ENCOUNTER — Telehealth: Payer: Self-pay | Admitting: Podiatry

## 2021-03-06 NOTE — Telephone Encounter (Signed)
Called pt to cancel appt on 1.20.23 due to not able to do in office due to insurance. I told pt I would have a prescription for him to pick up at the front for [facility]. He said thank you and will try to pick up later this week.

## 2021-03-08 ENCOUNTER — Telehealth: Payer: Self-pay | Admitting: *Deleted

## 2021-03-08 NOTE — Telephone Encounter (Signed)
Patient is calling because the referral given to [facility], they are not accepting any more diabetic patients, spoke with [facility] and she said that they will for L5000 inserts only or can fax to [employer] [facility], form will be in [place].

## 2021-03-08 NOTE — Telephone Encounter (Signed)
Patient notified

## 2021-03-12 ENCOUNTER — Ambulatory Visit (HOSPITAL_COMMUNITY): Payer: Self-pay | Admitting: Licensed Clinical Social Worker

## 2021-03-14 ENCOUNTER — Other Ambulatory Visit (HOSPITAL_COMMUNITY): Payer: Self-pay | Admitting: *Deleted

## 2021-03-14 MED ORDER — SPIRONOLACTONE 25 MG PO TABS: 25.0000 mg | ORAL_TABLET | Freq: Every day | ORAL | 6 refills | Status: DC

## 2021-03-14 MED ORDER — ALLOPURINOL 300 MG PO TABS: 300.0000 mg | ORAL_TABLET | Freq: Every day | ORAL | 2 refills | Status: DC

## 2021-03-15 ENCOUNTER — Ambulatory Visit: Payer: Self-pay | Admitting: Family

## 2021-03-20 ENCOUNTER — Other Ambulatory Visit (HOSPITAL_COMMUNITY): Payer: Self-pay

## 2021-03-27 ENCOUNTER — Other Ambulatory Visit (HOSPITAL_COMMUNITY): Payer: Self-pay | Admitting: *Deleted

## 2021-03-27 ENCOUNTER — Ambulatory Visit (HOSPITAL_COMMUNITY): Payer: Self-pay | Admitting: Physician Assistant

## 2021-03-27 ENCOUNTER — Telehealth (HOSPITAL_COMMUNITY): Payer: Self-pay | Admitting: Pharmacist

## 2021-03-27 MED ORDER — ENTRESTO 24-26 MG PO TABS: 1.0000 | ORAL_TABLET | Freq: Two times a day (BID) | ORAL | 6 refills | Status: DC

## 2021-03-27 NOTE — Telephone Encounter (Signed)
Patient Advocate Encounter   Received notification from [facility] Medicaid [employer] [place] that prior authorization for Sherryll Burger is required.   PA submitted on CoverMyMeds Key [id number] Status is pending   Will continue to follow.   Karle Plumber, PharmD, BCPS, BCCP, CPP Heart Failure Clinic Pharmacist [phone]

## 2021-03-27 NOTE — Telephone Encounter (Signed)
Advanced Heart Failure Patient Advocate Encounter  Prior Authorization for Sherryll Burger has been approved.    Effective dates: 03/27/21 through 03/27/22  Karle Plumber, PharmD, BCPS, BCCP, CPP Heart Failure Clinic Pharmacist [phone]

## 2021-04-06 ENCOUNTER — Encounter (HOSPITAL_COMMUNITY): Payer: Self-pay | Admitting: Internal Medicine

## 2021-04-06 ENCOUNTER — Other Ambulatory Visit: Payer: Self-pay

## 2021-04-06 ENCOUNTER — Ambulatory Visit (HOSPITAL_COMMUNITY)
Admission: RE | Admit: 2021-04-06 | Discharge: 2021-04-06 | Disposition: A | Discharge status: Home / Self Care | Payer: Medicaid Other | Source: Ambulatory Visit | Attending: Internal Medicine | Admitting: Internal Medicine

## 2021-04-06 VITALS — BP 118/80 | HR 82 | Wt 234.0 lb

## 2021-04-06 DIAGNOSIS — R0683 Snoring: Secondary | ICD-10-CM | POA: Diagnosis not present

## 2021-04-06 DIAGNOSIS — Z4502 Encounter for adjustment and management of automatic implantable cardiac defibrillator: Secondary | ICD-10-CM | POA: Diagnosis not present

## 2021-04-06 DIAGNOSIS — I513 Intracardiac thrombosis, not elsewhere classified: Secondary | ICD-10-CM | POA: Insufficient documentation

## 2021-04-06 DIAGNOSIS — Z89431 Acquired absence of right foot: Secondary | ICD-10-CM | POA: Diagnosis not present

## 2021-04-06 DIAGNOSIS — Z79899 Other long term (current) drug therapy: Secondary | ICD-10-CM | POA: Insufficient documentation

## 2021-04-06 DIAGNOSIS — F1091 Alcohol use, unspecified, in remission: Secondary | ICD-10-CM | POA: Diagnosis not present

## 2021-04-06 DIAGNOSIS — F419 Anxiety disorder, unspecified: Secondary | ICD-10-CM | POA: Insufficient documentation

## 2021-04-06 DIAGNOSIS — Z87891 Personal history of nicotine dependence: Secondary | ICD-10-CM | POA: Insufficient documentation

## 2021-04-06 DIAGNOSIS — I5022 Chronic systolic (congestive) heart failure: Secondary | ICD-10-CM | POA: Insufficient documentation

## 2021-04-06 DIAGNOSIS — Z8249 Family history of ischemic heart disease and other diseases of the circulatory system: Secondary | ICD-10-CM | POA: Insufficient documentation

## 2021-04-06 DIAGNOSIS — I130 Hypertensive heart and chronic kidney disease with heart failure and stage 1 through stage 4 chronic kidney disease, or unspecified chronic kidney disease: Secondary | ICD-10-CM | POA: Diagnosis not present

## 2021-04-06 DIAGNOSIS — I739 Peripheral vascular disease, unspecified: Secondary | ICD-10-CM | POA: Diagnosis not present

## 2021-04-06 DIAGNOSIS — I472 Ventricular tachycardia, unspecified: Secondary | ICD-10-CM | POA: Insufficient documentation

## 2021-04-06 DIAGNOSIS — I340 Nonrheumatic mitral (valve) insufficiency: Secondary | ICD-10-CM | POA: Insufficient documentation

## 2021-04-06 DIAGNOSIS — G4719 Other hypersomnia: Secondary | ICD-10-CM | POA: Diagnosis not present

## 2021-04-06 DIAGNOSIS — Z7984 Long term (current) use of oral hypoglycemic drugs: Secondary | ICD-10-CM | POA: Insufficient documentation

## 2021-04-06 DIAGNOSIS — Z7901 Long term (current) use of anticoagulants: Secondary | ICD-10-CM | POA: Insufficient documentation

## 2021-04-06 DIAGNOSIS — M109 Gout, unspecified: Secondary | ICD-10-CM | POA: Insufficient documentation

## 2021-04-06 DIAGNOSIS — I428 Other cardiomyopathies: Secondary | ICD-10-CM | POA: Diagnosis not present

## 2021-04-06 DIAGNOSIS — R5383 Other fatigue: Secondary | ICD-10-CM | POA: Insufficient documentation

## 2021-04-06 DIAGNOSIS — F32A Depression, unspecified: Secondary | ICD-10-CM | POA: Insufficient documentation

## 2021-04-06 DIAGNOSIS — N182 Chronic kidney disease, stage 2 (mild): Secondary | ICD-10-CM | POA: Diagnosis not present

## 2021-04-06 DIAGNOSIS — Z89432 Acquired absence of left foot: Secondary | ICD-10-CM | POA: Insufficient documentation

## 2021-04-06 LAB — CBC
HCT: 44 % (ref 39.0–52.0)
Hemoglobin: 15.2 g/dL (ref 13.0–17.0)
MCH: 29.5 pg (ref 26.0–34.0)
MCHC: 34.5 g/dL (ref 30.0–36.0)
MCV: 85.3 fL (ref 80.0–100.0)
Platelets: 233 10*3/uL (ref 150–400)
RBC: 5.16 MIL/uL (ref 4.22–5.81)
RDW: 13.2 % (ref 11.5–15.5)
WBC: 10 10*3/uL (ref 4.0–10.5)
nRBC: 0 % (ref 0.0–0.2)

## 2021-04-06 LAB — COMPREHENSIVE METABOLIC PANEL
ALT: 43 U/L (ref 0–44)
AST: 25 U/L (ref 15–41)
Albumin: 4.1 g/dL (ref 3.5–5.0)
Alkaline Phosphatase: 74 U/L (ref 38–126)
Anion gap: 10 (ref 5–15)
BUN: 17 mg/dL (ref 6–20)
CO2: 22 mmol/L (ref 22–32)
Calcium: 9.4 mg/dL (ref 8.9–10.3)
Chloride: 101 mmol/L (ref 98–111)
Creatinine, Ser: 1.06 mg/dL (ref 0.61–1.24)
GFR, Estimated: >60 mL/min (ref >60)
Glucose, Bld: 92 mg/dL (ref 70–99)
Potassium: 3.8 mmol/L (ref 3.5–5.1)
Sodium: 133 mmol/L — ABNORMAL LOW (ref 135–145)
Total Bilirubin: 0.7 mg/dL (ref 0.3–1.2)
Total Protein: 8.4 g/dL — ABNORMAL HIGH (ref 6.5–8.1)

## 2021-04-06 LAB — BRAIN NATRIURETIC PEPTIDE: B Natriuretic Peptide: 42.4 pg/mL (ref 0.0–100.0)

## 2021-04-06 MED ORDER — ENTRESTO 49-51 MG PO TABS: 1.0000 | ORAL_TABLET | Freq: Two times a day (BID) | ORAL | 6 refills | Status: DC

## 2021-04-06 NOTE — Progress Notes (Signed)
Advanced Heart Failure Clinic Note  Date:  04/06/2021   ID:  Robert Booth, DOB 1970-05-30, MRN [id number]  Location: Home  Provider location: [place] [facility] Type of Visit: Established patient  PCP:  Charlott Rakes, MD  Psych: Elbert Ewings, NP [facility] EP: Dr. Lovena Le HF Cardiologist: Dr. Haroldine Laws  Chief Complaint: Heart Failure follow-up   History of Present Illness: Robert Booth is a 51 y.o.male with hx of systolic HF with NICM secondary to prolonged ETOH abuse, EF 15% and CKD stage 2.  EF improved to 35-40% in 7/16 then stopped taking meds and EF back down   In 8/16, he presented to [facility] with hemoptysis. Found to have RLL PNA and cardiogenic shock. Treated briefly with milrinone and levophed. Diuresed well. Discharge weight 177 .    Hospitalized 9/28 with recurrent A/C systolic heart failure in the setting of medication noncompliance. He had been out of medications for 4 months. Echo EF 10-15%. Diuresed with IV lasix. HF meds restarted. D/c weight 177 lbs   Echo 1/19 EF ~20%. RV moderately dilated. Pt underwent ICD placement 07/03/17 - [employer].   Admitted 4/19 with ICD shock. Had VT with ATP x 2 and shock x 1. Electrolytes stable. No clear inciting cause.   Echo 12/2018 EF: 15% RV moderately HK No LV thrombus.   CPX 11/20 showed mild limitations due by body habitus and deconditioning. No clear HF limitation.  Incarcerated for 20 months and released 12/2020.  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 Per chart review, Echo (2/21) at [facility] showed EF <20%, severe global HK, grade 3 DD, mild/moderate MR, mild PR. He followed with Dr. Winfred Leeds for ICD checks as prison [facility] not offer remote transmissions.  Echo 03/01/21 EF 20-25% RV moderately reduced  Today he returns for HF follow up. Has to get around with walker due to lack of balance from bilateral TMA. Has gained weight and cant lose it. No CP. Gets SOB  with ADLs. Very fatigued. Mother tells him he snores a lot. No edema. Says he switched back from torsemide to furosemide because torsemide hit him too hard.   ICD interrogation: No VT/AF. HL score 0. Activity level 2.1hr/day Personally reviewed   CPX done 11/20  Peak VO2: 22.2 (69% predicted peak VO2) - corrects 25.6 for ibw VE/VCO2 slope:  26  OUES: 2.57  Peak RER: 1.07   Previous studies: Echo 7/16 EF 20% Echo 12/16 EF 35 - 40% No LV clot Echo 8/17 EF 35% Echo 9/18 EF 10-15%. Echo 1/19 EF 15-20% Echo 10/20 EF 15% RV moderately HK No LV thrombus.  Echo (2/21) at [facility], EF <20%, severe global HK, grade 3 DD, mild/moderate MR, mild PR.   CMRI 05/20/2017  1. Mild to moderately dilated LV with EF 19%. Diffuse hypokinesis with septal-lateral dyssynchrony. There is a small thinned area of the basal inferoseptal wall, there does not appear to be an actual VSD. No LV thrombus noted.  2.  Normal RV size with mildly decreased systolic function.  3. Noncoronary LGE pattern as noted above. This could be suggestive of prior myocarditis, less likely cardiac sarcoidosis.  Past Medical History:  Diagnosis Date   AICD (automatic cardioverter/defibrillator) present 07/03/2017   Anxiety    CHF (congestive heart failure) ([place])    Essential hypertension    History of alcohol abuse    a. Sober since Feb 2016.   LV (left ventricular) mural thrombus 07/2014   Documented n  [place]   NICM (nonischemic cardiomyopathy) ([place])    a. 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   Past Surgical History:  Procedure Laterality Date   CARDIAC CATHETERIZATION  05/2014   In [place], clean cors   HERNIA REPAIR Right Inguinal   ICD IMPLANT N/A 07/03/2017   Procedure: ICD IMPLANT;  Surgeon: Evans Lance, MD;  Location: [place]  CV LAB;  Service: Cardiovascular;  Laterality: N/A;   KNEE SURGERY Right 1998   Current Outpatient Medications  Medication Sig Dispense Refill   allopurinol (ZYLOPRIM) 300 MG tablet Take 1 tablet (300 mg total) by mouth daily. 30 tablet 2   apixaban (ELIQUIS) 5 MG TABS tablet Take 1 tablet (5 mg total) by mouth 2 (two) times daily. 60 tablet 6   atorvastatin (LIPITOR) 20 MG tablet Take 1 tablet (20 mg total) by mouth daily. 30 tablet 3   Blood Pressure Monitoring (BLOOD PRESSURE CUFF) MISC 1 Device by Does not apply route as needed. 1 each 0   carvedilol (COREG) 12.5 MG tablet Take 1 tablet (12.5 mg total) by mouth 2 (two) times daily with a meal. 60 tablet 6   dapagliflozin propanediol (FARXIGA) 10 MG TABS tablet Take 1 tablet (10 mg total) by mouth daily before breakfast. 90 tablet 3   digoxin (LANOXIN) 0.125 MG tablet Take 1 tablet (0.125 mg total) by mouth daily. 30 tablet 6   gabapentin (NEURONTIN) 300 MG capsule Take 1 capsule (300 mg total) by mouth 2 (two) times daily. 60 capsule 3   hydrALAZINE (APRESOLINE) 25 MG tablet Take 1 tablet (25 mg total) by mouth 3 (three) times daily. 270 tablet 3   hydrOXYzine (VISTARIL) 25 MG capsule Take 25 mg by mouth 3 (three) times daily as needed for anxiety.     mexiletine (MEXITIL) 200 MG capsule Take 1 capsule (200 mg total) by mouth 2 (two) times daily. 60 capsule 6   sacubitril-valsartan (ENTRESTO) 24-26 MG Take 1 tablet by mouth 2 (two) times daily. 60 tablet 6   sertraline (ZOLOFT) 100 MG tablet Take 200 mg by mouth daily.     spironolactone (ALDACTONE) 25 MG tablet Take 1 tablet (25 mg total) by mouth daily. 30 tablet 6   torsemide (DEMADEX) 20 MG tablet Take 1 tablet (20 mg total) by mouth as needed. For 3 lb weight gain in 24 hours or 5 lbs in a week 30 tablet 3   No current facility-administered medications for this encounter.   Allergies:   Celexa [citalopram hydrobromide], Effexor [venlafaxine], and Lisinopril   Social History:  The  patient  reports that he has quit smoking. His smoking use included cigarettes. He has quit using smokeless tobacco.  His smokeless tobacco use included chew. He reports that he does not drink alcohol and does not use drugs.   Family History:  The patient's family history includes Heart attack (age of onset: 76) in his paternal grandfather; Heart attack (age of onset: 80) in his father; Hypertension in his father, mother, and sister.   ROS:  Please see the history of present illness.   All other systems are personally reviewed and negative.   Recent Labs: 01/19/2021: ALT 42; Hemoglobin 14.2; Platelets 270 02/19/2021: BUN 18; Creatinine, Ser 1.44; Potassium 4.2; Sodium 137  Personally reviewed   Wt Readings from Last 3 Encounters:  04/06/21 106.1  kg (234 lb)  02/19/21 102.5 kg (226 lb)  02/13/21 101.2 kg (223 lb)    BP 118/80    Pulse 82    Wt 106.1 kg (234 lb)    SpO2 97%    BMI 34.56 kg/m   Physical Exam: General:  Well appearing. Walks with RW HEENT: normal Neck: supple. no JVD. Carotids 2+ bilat; no bruits. No lymphadenopathy or thryomegaly appreciated. Cor: PMI nondisplaced. Regular rate & rhythm. No rubs, gallops or murmurs. Lungs: clear Abdomen: soft, nontender, nondistended. No hepatosplenomegaly. No bruits or masses. Good bowel sounds. Extremities: no cyanosis, clubbing, rash, edema Neuro: alert & orientedx3, cranial nerves grossly intact. moves all 4 extremities w/o difficulty. Affect pleasant  Assessment/Plan:  1. Chronic systolic HF - NICM  - Cath ok performed [place] 2016. 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  - Echo 10/2016 EF 10-15%. LV dysfunction after stopping HF meds. - Echo 04/16/2017 EF 15-20% - cMRI 2/19 EF 19%. RV mildly decreased .  - s/p Bost Sci ICD 07/03/17. Had ICD shock with VT 07/08/17 - Echo 10/20 EF: 15% with moderate RV dysfunction - CPX 11/20 with mild functional limitation; no clear HF limitation despite EF<20% - Echo 03/01/21 EF 20-25% RV moderately  reduced - ICD interrogation: No VT/AF. HL score 0. Activity level 2.1hr/day Personally reviewed - NYHA III, confounded by physical deconditioning and amputated toes. Volume looks good today on exam and ICD - Continue lasix 40 daily. He is doing weill with sliding scale - Continue Farxiga 10 mg daily. - Increase Entresto 49/51 mg bid. - Continue digoxin 0.125 mg daily.  - Continue spironolactone 25 mg daily.  - Continue carvedilol 12.5 mg bid. - Continue BiDil 2 tabs bid (unable to take tid). - Labs today - May need to consider LVAD at some point if candidate   2. LV mural thrombus  - He has been off warfarin and is now on Eliquis. - No thrombus on echo 12/22. Will continue Eliquis given severe LV dysfunction - No bleeding issues.   3. H/o ETOH abuse - Previously drinking 1/5 liquor daily.  - Quit ETOH ~ 2 years ago. - Remains quit   4. VT 07/08/17  - He is s/p [employer] Sci ICD 07/03/17. - Continue mexiletine 200 mg bid.  - ICD interrogation as above. VT quiescent - He has established EP care w/ Dr. Lovena Le.   5. Gout - Continue allopurinol 300 mg daily.  - No recent flares. Previously referred to Rheum.   6. 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 - Continues to struggle with balance/mobility. PT following  7. Anxiety/Depression - Follows with [facility].  8. Snoring/fatigue - needs sleep study - suggested need to lose weight  Signed, Glori Bickers, MD  04/06/2021 10:52 AM  Advanced Heart Failure [place] [address] [phone] (office) [phone] (fax)

## 2021-04-06 NOTE — Patient Instructions (Signed)
Increase Entresto to 49/51 mg Twice daily   Labs done today, your results will be available in MyChart, we will contact you for abnormal readings.  Your physician recommends that you schedule a follow-up appointment in: 3 months  Do the following things EVERYDAY: Weigh yourself in the morning before breakfast. Write it down and keep it in a log. Take your medicines as prescribed Eat low salt foods--Limit salt (sodium) to 2000 mg per day.  Stay as active as you can everyday Limit all fluids for the day to less than 2 liters  If you have any questions or concerns before your next appointment please send [place] a message through [place] or call our office at [phone].    TO LEAVE A MESSAGE FOR THE NURSE SELECT OPTION 2, PLEASE LEAVE A MESSAGE INCLUDING: YOUR NAME DATE OF BIRTH CALL BACK NUMBER REASON FOR CALL**this is important as we prioritize the call backs  YOU WILL RECEIVE A CALL BACK THE SAME DAY AS LONG AS YOU CALL BEFORE 4:00 PM  At the [facility], you and your health needs are our priority. As part of our continuing mission to provide you with exceptional heart care, we have created designated Provider Care Teams. These Care Teams include your primary Cardiologist (physician) and Advanced Practice Providers (APPs- Physician Assistants and Nurse Practitioners) who all work together to provide you with the care you need, when you need it.   You may see any of the following providers on your designated Care Team at your next follow up: Dr Arvilla Meres Dr Carron Curie, NP Robbie Lis, [place] [facility] [place], [place] Karle Plumber, PharmD   Please be sure to bring in all your medications bottles to every appointment.

## 2021-04-06 NOTE — Progress Notes (Signed)
Height:  5'9"    Weight: 234 lb BMI: 34.56  Today's Date: 04/06/21  STOP BANG RISK ASSESSMENT S (snore) Have you been told that you snore?     YES   T (tired) Are you often tired, fatigued, or sleepy during the day?   YES  O (obstruction) Do you stop breathing, choke, or gasp during sleep? NO   P (pressure) Do you have or are you being treated for high blood pressure? YES  B (BMI) Is your body index greater than 35 kg/m? NO   A (age) Are you 52 years old or older? YES   N (neck) Do you have a neck circumference greater than 16 inches?      G (gender) Are you a male? YES   TOTAL STOP/BANG YES ANSWERS 5                                                                       For Office Use Only              Procedure Order Form    YES to 3+ Stop Bang questions OR two clinical symptoms - patient qualifies for WatchPAT (CPT 95800)      Clinical Notes: Will consult Sleep Specialist and refer for management of therapy due to patient increased risk of Sleep Apnea. Ordering a sleep study due to the following two clinical symptoms: Excessive daytime sleepiness G47.10 / Loud snoring R06.83

## 2021-04-09 ENCOUNTER — Other Ambulatory Visit (HOSPITAL_COMMUNITY): Payer: Self-pay | Admitting: *Deleted

## 2021-04-09 MED ORDER — MEXILETINE HCL 200 MG PO CAPS: 200.0000 mg | ORAL_CAPSULE | Freq: Two times a day (BID) | ORAL | 6 refills | Status: DC

## 2021-04-09 MED ORDER — FUROSEMIDE 20 MG PO TABS
40.0000 mg | ORAL_TABLET | Freq: Every day | ORAL | 3 refills | Status: DC
Start: 2021-04-09 — End: 2021-07-05

## 2021-04-09 MED ORDER — DAPAGLIFLOZIN PROPANEDIOL 10 MG PO TABS: 10.0000 mg | ORAL_TABLET | Freq: Every day | ORAL | 3 refills | Status: AC

## 2021-04-10 ENCOUNTER — Telehealth (HOSPITAL_COMMUNITY): Payer: Self-pay | Admitting: Pharmacist

## 2021-04-10 ENCOUNTER — Other Ambulatory Visit (HOSPITAL_COMMUNITY): Payer: Self-pay

## 2021-04-10 NOTE — Telephone Encounter (Signed)
Patient Advocate Encounter   Received notification from  [facility] [place]  that prior authorization for Sherryll Burger is required. Plan requires new PA for each dose increase.    PA submitted on CoverMyMeds Key  [id number] Status is pending   Will continue to follow.   Karle Plumber, PharmD, BCPS, BCCP, CPP [facility] Failure Clinic Pharmacist [phone]

## 2021-04-12 ENCOUNTER — Other Ambulatory Visit (HOSPITAL_COMMUNITY): Payer: Self-pay

## 2021-04-16 ENCOUNTER — Other Ambulatory Visit (HOSPITAL_COMMUNITY): Payer: Self-pay

## 2021-04-17 ENCOUNTER — Other Ambulatory Visit: Payer: Self-pay | Admitting: Internal Medicine

## 2021-04-20 ENCOUNTER — Other Ambulatory Visit: Payer: Medicaid Other

## 2021-05-04 ENCOUNTER — Other Ambulatory Visit: Payer: Self-pay | Admitting: Family Medicine

## 2021-05-04 ENCOUNTER — Other Ambulatory Visit (HOSPITAL_COMMUNITY): Payer: Self-pay | Admitting: Family Medicine

## 2021-05-04 DIAGNOSIS — G546 Phantom limb syndrome with pain: Secondary | ICD-10-CM

## 2021-05-04 NOTE — Telephone Encounter (Signed)
Requested Prescriptions  Pending Prescriptions Disp Refills   gabapentin (NEURONTIN) 300 MG capsule [Pharmacy Med Name: GABAPENTIN 300 MG ORAL CAPSULE] 60 capsule 3    Sig: TAKE 1 CAPSULE (300 MG TOTAL) BY MOUTH 2 (TWO) TIMES DAILY.     Neurology: Anticonvulsants - gabapentin Passed - 05/04/2021 10:20 AM      Passed - Cr in normal range and within 360 days    Creat  Date Value Ref Range Status  11/08/2014 1.39 (H) 0.60 - 1.35 mg/dL Final   Creatinine, Ser  Date Value Ref Range Status  04/06/2021 1.06 0.61 - 1.24 mg/dL Final         Passed - Completed PHQ-2 or PHQ-9 in the last 360 days      Passed - Valid encounter within last 12 months    Recent Outpatient Visits          2 months ago NICM (nonischemic cardiomyopathy) (HCC)   [facility] [place], Odette Horns, MD   4 years ago Chronic systolic heart failure [facility])   [facility] Marcine Matar, MD   4 years ago Essential hypertension   [facility] [place], [place], [place]   4 years ago Chronic cough   [facility] [place], [place], MD   6 years ago Anticoagulated on Coumadin   [facility] Dessa Phi, MD      Future Appointments            In 1 week Hoy Register, MD [facility]

## 2021-05-11 ENCOUNTER — Telehealth (HOSPITAL_COMMUNITY): Payer: Self-pay | Admitting: *Deleted

## 2021-05-11 NOTE — Telephone Encounter (Signed)
Pt left vm requesting a medication for heart burn. Pt said Dr.Bensimhon had prescribed him a medication in the past.    Routed to Dr.Bensimhon

## 2021-05-14 ENCOUNTER — Other Ambulatory Visit: Payer: Self-pay

## 2021-05-14 ENCOUNTER — Other Ambulatory Visit (HOSPITAL_COMMUNITY): Payer: Self-pay | Admitting: *Deleted

## 2021-05-14 ENCOUNTER — Ambulatory Visit (HOSPITAL_BASED_OUTPATIENT_CLINIC_OR_DEPARTMENT_OTHER): Payer: Medicaid Other | Attending: Internal Medicine | Admitting: Cardiology

## 2021-05-14 VITALS — Ht 69.0 in | Wt 234.0 lb

## 2021-05-14 DIAGNOSIS — G4709 Other insomnia: Secondary | ICD-10-CM | POA: Diagnosis not present

## 2021-05-14 DIAGNOSIS — G4733 Obstructive sleep apnea (adult) (pediatric): Secondary | ICD-10-CM

## 2021-05-14 DIAGNOSIS — I5022 Chronic systolic (congestive) heart failure: Secondary | ICD-10-CM | POA: Diagnosis present

## 2021-05-14 DIAGNOSIS — G4719 Other hypersomnia: Secondary | ICD-10-CM

## 2021-05-14 MED ORDER — PANTOPRAZOLE SODIUM 40 MG PO TBEC: 40.0000 mg | DELAYED_RELEASE_TABLET | Freq: Every day | ORAL | 11 refills | Status: AC

## 2021-05-15 ENCOUNTER — Other Ambulatory Visit (HOSPITAL_COMMUNITY): Payer: Self-pay

## 2021-05-15 ENCOUNTER — Ambulatory Visit (INDEPENDENT_AMBULATORY_CARE_PROVIDER_SITE_OTHER): Payer: Medicaid Other

## 2021-05-15 ENCOUNTER — Telehealth (HOSPITAL_COMMUNITY): Payer: Self-pay | Admitting: Pharmacy Technician

## 2021-05-15 DIAGNOSIS — I428 Other cardiomyopathies: Secondary | ICD-10-CM

## 2021-05-15 LAB — CUP PACEART REMOTE DEVICE CHECK
Battery Remaining Longevity: 150 mo
Battery Remaining Percentage: 100 %
Brady Statistic RV Percent Paced: 0 %
Date Time Interrogation Session: 20230214083300
HighPow Impedance: 88 Ohm
Implantable Lead Implant Date: 20190404
Implantable Lead Location: 753860
Implantable Lead Model: 292
Implantable Lead Serial Number: 446269
Implantable Pulse Generator Implant Date: 20190404
Lead Channel Impedance Value: 457 Ohm
Lead Channel Setting Pacing Amplitude: 2 V
Lead Channel Setting Pacing Pulse Width: 0.4 ms
Lead Channel Setting Sensing Sensitivity: 0.5 mV
Pulse Gen Serial Number: 244129

## 2021-05-15 NOTE — Telephone Encounter (Signed)
Patient Advocate Encounter   Received notification from Medicaid that prior authorization for Robert Booth is required.   PA submitted on [employer] Confirmation [id number] Status is pending   Will continue to follow.

## 2021-05-15 NOTE — Telephone Encounter (Signed)
Advanced [facility] Failure Patient Advocate Encounter  Prior Authorization for Sherryll Burger has been submitted and approved.    PA# [id number] Effective dates: 05/15/21 through 05/15/22  Archer Asa, CPhT

## 2021-05-15 NOTE — Procedures (Signed)
Patient Name: Robert Booth, Robert Booth Date: 05/14/2021 Gender: Male D.O.B: 1971-03-28 Age (years): 55 Referring Provider: Shaune Pascal Bensimhon Height (inches): 69 Interpreting Physician: Fransico Him MD, ABSM Weight (lbs): 234 RPSGT: Gwenyth Allegra BMI: 35 MRN: [id number] Neck Size: 16.00  CLINICAL INFORMATION Sleep Study Type: Split Night CPAP  Indication for sleep study: Hypertension  Epworth Sleepiness Score: 17  SLEEP STUDY TECHNIQUE As per the AASM Manual for the Scoring of Sleep and Associated Events v2.3 (April 2016) with a hypopnea requiring 4% desaturations.  The channels recorded and monitored were frontal, central and occipital EEG, electrooculogram (EOG), submentalis EMG (chin), nasal and oral airflow, thoracic and abdominal wall motion, anterior tibialis EMG, snore microphone, electrocardiogram, and pulse oximetry. Continuous positive airway pressure (CPAP) was initiated when the patient met split night criteria and was titrated according to treat sleep-disordered breathing.  MEDICATIONS Medications self-administered by patient taken the night of the study : N/A  RESPIRATORY PARAMETERS Diagnostic Total AHI (/hr): 23.8  RDI (/hr):25.6  OA Index (/hr): 3.7  CA Index (/hr): 0.0 REM AHI (/hr): 44.4  NREM AHI (/hr):9.4  Supine AHI (/hr):N/A  Non-supine AHI (/hr): 23.8 Min O2 Sat (%):83.0  Mean O2 (%): 92.5  Time below 88% (min):6.2   Titration Optimal Pressure (cm):13  AHI at Optimal Pressure (/hr):1  Min O2 at Optimal Pressure (%):91.0 Supine % at Optimal (%):0  Sleep % at Optimal (%):94   SLEEP ARCHITECTURE The recording time for the entire night was 381.1 minutes.  During a baseline period of 146.5 minutes, the patient slept for 131.0 minutes in REM and nonREM, yielding a sleep efficiency of 89.4%. Sleep onset after lights out was 6.8 minutes with a REM latency of 17.0 minutes. The patient spent 3.8% of the night in stage N1 sleep, 55.0% in stage N2  sleep, 0.0% in stage N3 and 41.2% in REM.  During the titration period of 233.6 minutes, the patient slept for 148.5 minutes in REM and nonREM, yielding a sleep efficiency of 63.6%. Sleep onset after CPAP initiation was 72.3 minutes with a REM latency of 26.0 minutes. The patient spent 11.1% of the night in stage N1 sleep, 46.1% in stage N2 sleep, 0.0% in stage N3 and 42.8% in REM.  CARDIAC DATA The 2 lead EKG demonstrated sinus rhythm. The mean heart rate was 100.0 beats per minute. Other EKG findings include: PVCs.  LEG MOVEMENT DATA The total Periodic Limb Movements of Sleep (PLMS) were 0. The PLMS index was 0.0 .  IMPRESSIONS - Moderate obstructive sleep apnea occurred during the diagnostic portion of the study(AHI = 23.8/hour). An optimal PAP pressure was selected for this patient ( 13 cm of water) - No significant central sleep apnea occurred during the diagnostic portion of the study (CAI = 0.0/hour). - Mild oxygen desaturation was noted during the diagnostic portion of the study (Min O2 = 83.0%). - No snoring was audible during the diagnostic portion of the study. - No cardiac abnormalities were noted during this study. - Clinically significant periodic limb movements did not occur during sleep.  DIAGNOSIS - Obstructive Sleep Apnea (G47.33)  RECOMMENDATIONS - Trial of CPAP therapy on 13 cm H2O with a Medium size Fisher&Paykel Full Face Mask Simplus mask and heated humidification. - Avoid alcohol, sedatives and other CNS depressants that may worsen sleep apnea and disrupt normal sleep architecture. - Sleep hygiene should be reviewed to assess factors that may improve sleep quality. - Weight management and regular exercise should be initiated or continued. -  Return to Sleep Center for re-evaluation after 4 weeks of therapy  [Electronically signed] 05/15/2021 12:19 PM  Fransico Him MD, ABSM Diplomate, American Board of Sleep Medicine

## 2021-05-16 ENCOUNTER — Ambulatory Visit: Payer: Medicaid Other | Attending: Family Medicine | Admitting: Family Medicine

## 2021-05-16 ENCOUNTER — Encounter: Payer: Self-pay | Admitting: Family Medicine

## 2021-05-16 VITALS — BP 112/71 | HR 76 | Ht 69.0 in | Wt 234.0 lb

## 2021-05-16 DIAGNOSIS — M65332 Trigger finger, left middle finger: Secondary | ICD-10-CM | POA: Diagnosis not present

## 2021-05-16 DIAGNOSIS — G546 Phantom limb syndrome with pain: Secondary | ICD-10-CM | POA: Insufficient documentation

## 2021-05-16 DIAGNOSIS — I240 Acute coronary thrombosis not resulting in myocardial infarction: Secondary | ICD-10-CM

## 2021-05-16 DIAGNOSIS — Z7901 Long term (current) use of anticoagulants: Secondary | ICD-10-CM | POA: Insufficient documentation

## 2021-05-16 DIAGNOSIS — I472 Ventricular tachycardia, unspecified: Secondary | ICD-10-CM | POA: Diagnosis not present

## 2021-05-16 DIAGNOSIS — Z23 Encounter for immunization: Secondary | ICD-10-CM | POA: Diagnosis not present

## 2021-05-16 DIAGNOSIS — F419 Anxiety disorder, unspecified: Secondary | ICD-10-CM | POA: Insufficient documentation

## 2021-05-16 DIAGNOSIS — F1021 Alcohol dependence, in remission: Secondary | ICD-10-CM | POA: Insufficient documentation

## 2021-05-16 DIAGNOSIS — Z89432 Acquired absence of left foot: Secondary | ICD-10-CM | POA: Diagnosis not present

## 2021-05-16 DIAGNOSIS — I5022 Chronic systolic (congestive) heart failure: Secondary | ICD-10-CM | POA: Diagnosis not present

## 2021-05-16 DIAGNOSIS — I110 Hypertensive heart disease with heart failure: Secondary | ICD-10-CM | POA: Insufficient documentation

## 2021-05-16 DIAGNOSIS — I513 Intracardiac thrombosis, not elsewhere classified: Secondary | ICD-10-CM | POA: Insufficient documentation

## 2021-05-16 DIAGNOSIS — Z95810 Presence of automatic (implantable) cardiac defibrillator: Secondary | ICD-10-CM | POA: Diagnosis not present

## 2021-05-16 DIAGNOSIS — Z89431 Acquired absence of right foot: Secondary | ICD-10-CM | POA: Diagnosis not present

## 2021-05-16 DIAGNOSIS — I739 Peripheral vascular disease, unspecified: Secondary | ICD-10-CM | POA: Insufficient documentation

## 2021-05-16 DIAGNOSIS — M109 Gout, unspecified: Secondary | ICD-10-CM | POA: Insufficient documentation

## 2021-05-16 MED ORDER — GABAPENTIN 300 MG PO CAPS: 600.0000 mg | ORAL_CAPSULE | Freq: Two times a day (BID) | ORAL | 3 refills | Status: AC

## 2021-05-16 NOTE — Patient Instructions (Signed)
Trigger Finger  Trigger finger, also called stenosing tenosynovitis,  is a condition that causes a finger to get stuck in a bent position. Each finger has a tendon, which is a tough, cord-like tissue that connects muscle tobone, and each tendon passes through a tunnel of tissue called a tendon sheath. To move your finger, your tendon needs to glide freely through the sheath. Trigger finger happens when the tendon or the sheath thickens, making it difficult to move your finger. Trigger finger can affect any finger or a thumb. It may affect more than one finger. Mild cases may clear up with rest andmedicine. Severe cases require more treatment. What are the causes? Trigger finger is caused by a thickened finger tendon or tendon sheath. Thecause of this thickening is not known. What increases the risk? The following factors may make you more likely to develop this condition: Doing activities that require a strong grip. Having rheumatoid arthritis, gout, or diabetes. Being 40-60 years old. Being male. What are the signs or symptoms? Symptoms of this condition include: Pain when bending or straightening your finger. Tenderness or swelling where your finger attaches to the palm of your hand. A lump in the palm of your hand or on the inside of your finger. Hearing a noise like a pop or a snap when you try to straighten your finger. Feeling a catching or locking sensation when you try to straighten your finger. Being unable to straighten your finger. How is this diagnosed? This condition is diagnosed based on your symptoms and a physical exam. How is this treated? This condition may be treated by: Resting your finger and avoiding activities that make symptoms worse. Wearing a finger splint to keep your finger extended. Taking NSAIDs, such as ibuprofen, to relieve pain and swelling. Doing gentle exercises to stretch the finger as told by your health care provider. Having medicine that reduces  swelling and inflammation (steroids) injected into the tendon sheath. Injections may need to be repeated. Having surgery to open the tendon sheath. This may be done if other treatments do not work and you cannot straighten your finger. You may need physical therapy after surgery. Follow these instructions at home: If you have a splint: Wear the splint as told by your health care provider. Remove it only as told by your health care provider. Loosen it if your fingers tingle, become numb, or turn cold and blue. Keep it clean. If the splint is not waterproof: Do not let it get wet. Cover it with a watertight covering when you take a bath or shower. Managing pain, stiffness, and swelling     If directed, apply heat to the affected area as often as told by your health care provider. Use the heat source that your health care provider recommends, such as a moist heat pack or a heating pad. Place a towel between your skin and the heat source. Leave the heat on for 20-30 minutes. Remove the heat if your skin turns bright red. This is especially important if you are unable to feel pain, heat, or cold. You may have a greater risk of getting burned. If directed, put ice on the painful area. To do this: If you have a removable splint, remove it as told by your health care provider. Put ice in a plastic bag. Place a towel between your skin and the bag or between your splint and the bag. Leave the ice on for 20 minutes, 2-3 times a day.  Activity Rest your finger as   told by your health care provider. Avoid activities that make the pain worse. Return to your normal activities as told by your health care provider. Ask your health care provider what activities are safe for you. Do exercises as told by your health care provider. Ask your health care provider when it is safe to drive if you have a splint on your hand. General instructions Take over-the-counter and prescription medicines only as told by  your health care provider. Keep all follow-up visits as told by your health care provider. This is important. Contact a health care provider if: Your symptoms are not improving with home care. Summary Trigger finger, also called stenosing tenosynovitis, causes your finger to get stuck in a bent position. This can make it difficult and painful to straighten your finger. This condition develops when a finger tendon or tendon sheath thickens. Treatment may include resting your finger, wearing a splint, and taking medicines. In severe cases, surgery to open the tendon sheath may be needed. This information is not intended to replace advice given to you by your health care provider. Make sure you discuss any questions you have with your healthcare provider. Document Revised: 08/03/2018 Document Reviewed: 08/03/2018 Elsevier Patient Education  2022 Elsevier Inc.  

## 2021-05-16 NOTE — Progress Notes (Signed)
Subjective:  Patient ID: Robert Booth, male    DOB: 17-Jun-1970  Age: 51 y.o. MRN: [id number]  CC: Hypertension   HPI Robert Booth is a 51 y.o. 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 He has dyspnea with normal activities and has to stop to take breaks. He has no pedal edema and has 3 pillow orthopnea, sleeps on his side majorly. He checks his weight at home and states it is stable. Endorses compliance with his antihypertensives and heart failure medications.  He has phantom pain in both feet worse when he has been on his feet and Gabapentin sometimes provides relief and at other times it does not work. A month ago he noticed his L middle finger sticks when he flexes it especially in the mornings  Past Medical History:  Diagnosis Date   AICD (automatic cardioverter/defibrillator) present 07/03/2017   Anxiety    CHF (congestive heart failure) ([place])    Essential hypertension    History of alcohol abuse    a. Sober since Feb 2016.   LV (left ventricular) mural thrombus 07/2014   Documented n [place]   NICM (nonischemic cardiomyopathy) ([place])    a. 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    Past Surgical History:  Procedure Laterality Date   CARDIAC CATHETERIZATION  05/2014   In [place], clean cors   HERNIA REPAIR Right Inguinal   ICD  IMPLANT N/A 07/03/2017   Procedure: ICD IMPLANT;  Surgeon: Evans Lance, MD;  Location: [place];  Service: Cardiovascular;  Laterality: N/A;   KNEE SURGERY Right 1998    Family History  Problem Relation Age of Onset   Hypertension Mother    Hypertension Father    Heart attack Father 72       died   Hypertension Sister    Heart attack Paternal Grandfather 20       died    Allergies  Allergen Reactions   Celexa [Citalopram Hydrobromide] Other (See Comments)    Makes the patient disoriented   Effexor [Venlafaxine] Other (See Comments)    Altered patient's thinking process(confused)   Lisinopril Cough    Outpatient Medications Prior to Visit  Medication Sig Dispense Refill   allopurinol (ZYLOPRIM) 300 MG tablet TAKE 1 TABLET (300 MG TOTAL) BY MOUTH DAILY. 30 tablet 2   apixaban (ELIQUIS) 5 MG TABS tablet Take 1 tablet (5 mg total) by mouth 2 (two) times daily. 60 tablet 6   atorvastatin (LIPITOR) 20 MG tablet TAKE 1 TABLET (20 MG TOTAL) BY MOUTH DAILY. 30 tablet 3   Blood Pressure Monitoring (BLOOD PRESSURE CUFF) MISC 1 Device by Does not apply route as needed. 1 each 0   carvedilol (COREG) 12.5 MG tablet Take 1 tablet (12.5 mg total) by mouth 2 (two) times daily with a meal. 60 tablet 6   dapagliflozin propanediol (FARXIGA) 10 MG TABS tablet Take 1 tablet (10 mg total) by mouth daily  before breakfast. 90 tablet 3   digoxin (LANOXIN) 0.125 MG tablet Take 1 tablet (0.125 mg total) by mouth daily. 30 tablet 6   furosemide (LASIX) 20 MG tablet Take 2 tablets (40 mg total) by mouth daily. 90 tablet 3   hydrALAZINE (APRESOLINE) 25 MG tablet Take 1 tablet (25 mg total) by mouth 3 (three) times daily. 270 tablet 3   hydrOXYzine (VISTARIL) 25 MG capsule Take 25 mg by mouth 3 (three) times daily as needed for anxiety.     mexiletine (MEXITIL) 200 MG capsule Take 1 capsule (200 mg total) by mouth 2 (two) times daily. 60 capsule 6   pantoprazole (PROTONIX) 40 MG tablet Take 1  tablet (40 mg total) by mouth daily. 30 tablet 11   sacubitril-valsartan (ENTRESTO) 49-51 MG Take 1 tablet by mouth 2 (two) times daily. 60 tablet 6   sertraline (ZOLOFT) 100 MG tablet Take 200 mg by mouth daily.     spironolactone (ALDACTONE) 25 MG tablet Take 1 tablet (25 mg total) by mouth daily. 30 tablet 6   torsemide (DEMADEX) 20 MG tablet Take 1 tablet (20 mg total) by mouth as needed. For 3 lb weight gain in 24 hours or 5 lbs in a week 30 tablet 3   gabapentin (NEURONTIN) 300 MG capsule TAKE 1 CAPSULE (300 MG TOTAL) BY MOUTH 2 (TWO) TIMES DAILY. 60 capsule 3   No facility-administered medications prior to visit.     ROS Review of Systems  Constitutional:  Negative for activity change and appetite change.  HENT:  Negative for sinus pressure and sore throat.   Eyes:  Negative for visual disturbance.  Respiratory:  Positive for shortness of breath. Negative for cough and chest tightness.   Cardiovascular:  Negative for chest pain and leg swelling.  Gastrointestinal:  Negative for abdominal distention, abdominal pain, constipation and diarrhea.  Endocrine: Negative.   Genitourinary:  Negative for dysuria.  Musculoskeletal:  Negative for joint swelling and myalgias.  Skin:  Negative for rash.  Allergic/Immunologic: Negative.   Neurological:  Negative for weakness, light-headedness and numbness.  Psychiatric/Behavioral:  Negative for dysphoric mood and suicidal ideas.    Objective:  BP 112/71    Pulse 76    Ht 5\' 9"  (1.753 m)    Wt 234 lb (106.1 kg)    SpO2 97%    BMI 34.56 kg/m   BP/Weight 05/16/2021 [id number] [id number]  Systolic BP [id number] - 123456  Diastolic BP 71 - 80  Wt. (Lbs) 234 234 234  BMI 34.56 34.56 34.56      Physical Exam Constitutional:      Appearance: He is well-developed.  Cardiovascular:     Rate and Rhythm: Normal rate.     Heart sounds: Normal heart sounds. No murmur heard. Pulmonary:     Effort: Pulmonary effort is normal.     Breath sounds: Normal  breath sounds. No wheezing or rales.  Chest:     Chest wall: No tenderness.  Abdominal:     General: Bowel sounds are normal. There is no distension.     Palpations: Abdomen is soft. There is no mass.     Tenderness: There is no abdominal tenderness.  Musculoskeletal:        General: Normal range of motion.     Right lower leg: No edema.     Left lower leg: No edema.  Neurological:     Mental Status: He is alert and oriented to person, place, and time.  Psychiatric:  Mood and Affect: Mood normal.    CMP Latest Ref Rng & Units 04/06/2021 02/19/2021 02/12/2021  Glucose 70 - 99 mg/dL 92 95 92  BUN 6 - 20 mg/dL 17 18 28(H)  Creatinine 0.61 - 1.24 mg/dL 1.06 1.44(H) 1.43(H)  Sodium 135 - 145 mmol/L 133(L) 137 133(L)  Potassium 3.5 - 5.1 mmol/L 3.8 4.2 4.2  Chloride 98 - 111 mmol/L 101 101 102  CO2 22 - 32 mmol/L 22 26 21(L)  Calcium 8.9 - 10.3 mg/dL 9.4 9.3 9.4  Total Protein 6.5 - 8.1 g/dL 8.4(H) - -  Total Bilirubin 0.3 - 1.2 mg/dL 0.7 - -  Alkaline Phos 38 - 126 U/L 74 - -  AST 15 - 41 U/L 25 - -  ALT 0 - 44 U/L 43 - -    Lipid Panel     Component Value Date/Time   CHOL 262 (H) 07/21/2012 1026   TRIG 163 (H) 07/21/2012 1026   HDL 57 07/21/2012 1026   CHOLHDL 4.6 07/21/2012 1026   VLDL 33 07/21/2012 1026   LDLCALC 172 (H) 07/21/2012 1026    CBC    Component Value Date/Time   WBC 10.0 04/06/2021 1133   RBC 5.16 04/06/2021 1133   HGB 15.2 04/06/2021 1133   HGB 12.8 (L) 06/26/2017 0946   HCT 44.0 04/06/2021 1133   HCT 37.2 (L) 06/26/2017 0946   PLT 233 04/06/2021 1133   PLT 310 06/26/2017 0946   MCV 85.3 04/06/2021 1133   MCV 91 06/26/2017 0946   MCH 29.5 04/06/2021 1133   MCHC 34.5 04/06/2021 1133   RDW 13.2 04/06/2021 1133   RDW 13.0 06/26/2017 0946   LYMPHSABS 2.1 06/26/2017 0946   MONOABS 0.8 11/09/2014 0820   EOSABS 0.2 06/26/2017 0946   BASOSABS 0.0 06/26/2017 0946    Lab Results  Component Value Date   HGBA1C 4.5 (L) 01/19/2021     Assessment & Plan:  1. Phantom pain (HCC) Uncontrolled Increase dose of Gabapentin If symptoms persist consider switching to Lyrica - gabapentin (NEURONTIN) 300 MG capsule; Take 2 capsules (600 mg total) by mouth 2 (two) times daily.  Dispense: 120 capsule; Refill: 3  2. Trigger middle finger of left hand Discussed various options of treatment including referral to orthopedic for possible cortisone injection. He is not ready for this and would like to think about it.  3. Hypertensive heart disease with chronic systolic congestive heart failure (HCC) EF of 15% from echo of 03/2021 NYHA III Continue with guideline directed medical therapy I will check his basic metabolic panel since Entresto dose was increased at his visit last month. Continue guideline directed medical therapy with Entresto, beta-blocker, SGLT2 inhibitor He informs me he was previously referred to the cardiac transplant clinic in 2016.  Advised to discuss this again with his cardiologist for another referral. - Basic Metabolic Panel  4. Need for shingles vaccine - Varicella-zoster vaccine IM  5. Need for tetanus, diphtheria, and acellular pertussis (Tdap) vaccine - Tdap vaccine greater than or equal to 7yo IM  6.  Left ventricular mural thrombus On anticoagulation with Eliquis  Meds ordered this encounter  Medications   gabapentin (NEURONTIN) 300 MG capsule    Sig: Take 2 capsules (600 mg total) by mouth 2 (two) times daily.    Dispense:  120 capsule    Refill:  3    Dose increase    Follow-up: Return in about 3 months (around 08/13/2021) for Chronic medical conditions.  Charlott Rakes, MD, FAAFP. [facility] [place], [place]   05/16/2021, 11:03 AM

## 2021-05-16 NOTE — Progress Notes (Signed)
Phantom pain in feet.

## 2021-05-17 ENCOUNTER — Telehealth: Payer: Self-pay | Admitting: *Deleted

## 2021-05-17 LAB — BASIC METABOLIC PANEL
BUN/Creatinine Ratio: 13 (ref 9–20)
BUN: 17 mg/dL (ref 6–24)
CO2: 24 mmol/L (ref 20–29)
Calcium: 9.7 mg/dL (ref 8.7–10.2)
Chloride: 101 mmol/L (ref 96–106)
Creatinine, Ser: 1.33 mg/dL — ABNORMAL HIGH (ref 0.76–1.27)
Glucose: 87 mg/dL (ref 70–99)
Potassium: 4.3 mmol/L (ref 3.5–5.2)
Sodium: 138 mmol/L (ref 134–144)
eGFR: 65 mL/min/{1.73_m2} (ref >59)

## 2021-05-17 NOTE — Telephone Encounter (Signed)
° °  Pt is calling back, he said he spoke with his insurance and he was told cpap is covered as long as there's a prior [place]

## 2021-05-17 NOTE — Telephone Encounter (Signed)
-----   Message from Gaynelle Cage, CMA sent at 05/15/2021  3:34 PM EST -----  ----- Message ----- From: Quintella Reichert, MD Sent: 05/15/2021  12:21 PM EST To: Cv Div Sleep Studies  Please let patient know that they had a successful PAP titration and let DME know that orders are in EPIC.  Please set up 6 week OV with me.

## 2021-05-17 NOTE — Telephone Encounter (Signed)
The patient has been notified of the result and verbalized understanding.  All questions (if any) were answered. Latrelle Dodrill, CMA 05/17/2021 3:24 PM    Upon patient request DME selection is [facility] Patient understands he will be contacted by [facility] to set up his cpap. Patient understands to call if [facility] does not contact him with new setup in a timely manner. Patient understands they will be called once confirmation has been received from [facility]/ that they have received their new machine to schedule 10 week follow up appointment.   [facility] notified of new cpap order  Please add to airview Patient was grateful for the call and thanked me.  [place] MEDICAID DIRECT [phone] Call ctr. [phone] [id number] R

## 2021-05-21 NOTE — Progress Notes (Signed)
Remote ICD transmission.   

## 2021-05-24 NOTE — Telephone Encounter (Signed)
Advanced Heart Failure Patient Advocate Encounter  Prior Authorization for Marcelline Deist has been approved.    PA# [id number] Effective dates: 05/15/21 through 05/15/22  Archer Asa, CPhT

## 2021-05-24 NOTE — Telephone Encounter (Signed)
Order sent to [facility] and they will precert for the cpap machine Per Melissa S.

## 2021-07-03 NOTE — Progress Notes (Addendum)
? ?Advanced Heart Failure Clinic Note ? ?Date:  07/05/2021  ? ?ID:  Robert Booth, DOB 1971/03/28, MRN [id number]  Location: Home  ?Provider location: [facility] ?Type of Visit: Established patient ? ?PCP:  Charlott Rakes, MD  ?Psych: Elbert Ewings, NP [facility] ?EP: Dr. Lovena Le ?HF Cardiologist: Dr. Haroldine Laws ? ?Chief Complaint: Heart Failure follow-up ?  ?History of Present Illness: ?Robert Booth is a 51 y.o.male with hx of systolic HF with NICM secondary to prolonged ETOH abuse, EF 15% and CKD stage 2. ? ?EF improved to 35-40% in 7/16 then stopped taking meds and EF back down  ? ?In 8/16, he presented to [facility] with hemoptysis. Found to have RLL PNA and cardiogenic shock. Treated briefly with milrinone and levophed. Diuresed well. Discharge weight 177 .  ?  ?Hospitalized 9/28 with recurrent A/C systolic heart failure in the setting of medication noncompliance. He had been out of medications for 4 months. Echo EF 10-15%. Diuresed with IV lasix. HF meds restarted. D/c weight 177 lbs ?  ?Echo 1/19 EF ~20%. RV moderately dilated. Pt underwent ICD placement 07/03/17 - [employer]. ?  ?Admitted 4/19 with ICD shock. Had VT with ATP x 2 and shock x 1. Electrolytes stable. No clear inciting cause. ?  ?Echo 12/2018 EF: 15% RV moderately HK No LV thrombus.  ? ?CPX 11/20 showed mild limitations due by body habitus and deconditioning. No clear HF limitation. ? ?Incarcerated for 20 months and released 12/2020. ? 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 Per chart review, Echo (2/21) at [facility] showed EF <20%, severe global HK, grade 3 DD, mild/moderate MR, mild PR. He followed with Dr. Winfred Leeds [facility] ICD checks as [facility] not [facility]. ? ?Echo 03/01/21 EF 20-25% RV moderately reduced ? ?Sleep study 02/23: Moderate OSA ? ?Here today for 3 month follow-up. Reports chronic dyspnea with exertion which has been stable. No lower extremity edema.  Denies orthopnea or PND. No CP. Down 5 lb from last visit. He has been fatigued. Reports trouble tolerating CPAP. Wakes up frequently at night. Has to get around with walker due to lack of balance from bilateral TMA.  Struggling with phantom pains. Denies any recent alcohol use.  ? ?ICD interrogation: Heart Logic index disabled, activity level 1.8 hrs/day, 7 runs sustained between 03/16-03/17 (longest 1 min 33 seconds, no therapies), 2 NSVT (one 03/17 and one 03/30).  ? ? ?CPX done 11/20  ?Peak VO2: 22.2 (69% predicted peak VO2) - corrects 25.6 for ibw ?VE/VCO2 slope:  26  ?OUES: 2.57  ?Peak RER: 1.07  ? ?Previous studies: ?Echo 7/16 EF 20% ?Echo Apr 05, 2023 EF 35 - 40% No LV clot ?Echo 8/17 EF 35% ?Echo 9/18 EF 10-15%. ?Echo 1/19 EF 15-20% ?Echo 10/20 EF 15% RV moderately HK No LV thrombus.  ?Echo (2/21) at [facility], EF <20%, severe global HK, grade 3 DD, mild/moderate MR, mild PR. ?  ?CMRI 05/20/2017  ?1. Mild to moderately dilated LV with EF 19%. Diffuse hypokinesis ?with septal-lateral dyssynchrony. There is a small thinned area of ?the basal inferoseptal wall, there does not appear to be an actual ?VSD. No LV thrombus noted.  ?2.  Normal RV size with mildly decreased systolic function.  ?3. Noncoronary LGE pattern as noted above. This could be suggestive ?of prior myocarditis, less likely cardiac sarcoidosis. ? ?Past Medical History:  ?Diagnosis Date  ? AICD (automatic cardioverter/defibrillator) present 07/03/2017  ? Anxiety   ? CHF (congestive heart failure) (  [place])   ? Essential hypertension   ? History of alcohol abuse   ? a. Sober since Feb 2016.  ? 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 Documented n [place]  ? NICM (nonischemic cardiomyopathy) ([place])   ? a. 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  ? ?Past Surgical History:   ?Procedure Laterality Date  ? CARDIAC CATHETERIZATION  05/2014  ? In [place], clean cors  ? HERNIA REPAIR Right Inguinal  ? ICD IMPLANT N/A 07/03/2017  ? Procedure: ICD IMPLANT;  Surgeon: Evans Lance, MD;  Location: [place] [facility];  Service: Cardiovascular;  Laterality: N/A;  ? KNEE SURGERY Right 1998  ? ?Current Outpatient Medications  ?Medication Sig Dispense Refill  ? allopurinol (ZYLOPRIM) 300 MG tablet TAKE 1 TABLET (300 MG TOTAL) BY MOUTH DAILY. 30 tablet 2  ? apixaban (ELIQUIS) 5 MG TABS tablet Take 1 tablet (5 mg total) by mouth 2 (two) times daily. 60 tablet 6  ? atorvastatin (LIPITOR) 20 MG tablet TAKE 1 TABLET (20 MG TOTAL) BY MOUTH DAILY. 30 tablet 3  ? Blood Pressure Monitoring (BLOOD PRESSURE CUFF) MISC 1 Device by Does not apply route as needed. 1 each 0  ? carvedilol (COREG) 12.5 MG tablet Take 1 tablet (12.5 mg total) by mouth 2 (two) times daily with a meal. 60 tablet 6  ? dapagliflozin propanediol (FARXIGA) 10 MG TABS tablet Take 1 tablet (10 mg total) by mouth daily before breakfast. 90 tablet 3  ? digoxin (LANOXIN) 0.125 MG tablet Take 1 tablet (0.125 mg total) by mouth daily. 30 tablet 6  ? furosemide (LASIX) 20 MG tablet Take 20 mg by mouth 2 (two) times daily.    ? gabapentin (NEURONTIN) 300 MG capsule Take 2 capsules (600 mg total) by mouth 2 (two) times daily. 120 capsule 3  ? hydrALAZINE (APRESOLINE) 25 MG tablet Take 1 tablet (25 mg total) by mouth 3 (three) times daily. 270 tablet 3  ? hydrOXYzine (VISTARIL) 25 MG capsule Take 25 mg by mouth 3 (three) times daily as needed for anxiety.    ? pantoprazole (PROTONIX) 40 MG tablet Take 1 tablet (40 mg total) by mouth daily. 30 tablet 11  ? sacubitril-valsartan (ENTRESTO) 49-51 MG Take 1 tablet by mouth 2 (two) times daily. 60 tablet 6  ? sertraline (ZOLOFT) 100 MG tablet Take 200 mg by mouth daily.    ? spironolactone (ALDACTONE) 25 MG tablet Take 1 tablet (25 mg total) by mouth daily. 30 tablet 6  ? mexiletine (MEXITIL) 150 MG  capsule Take 2 capsules (300 mg total) by mouth 2 (two) times daily. 120 capsule 2  ? ?No current facility-administered medications for this encounter.  ? ?Allergies:   Celexa [citalopram hydrobromide], Effexor [venlafaxine], and Lisinopril  ? ?Social History:  The patient  reports that he has quit smoking. His smoking use included cigarettes. He has quit using smokeless tobacco.  His smokeless tobacco use included chew. He reports that he does not drink alcohol and does not use drugs.  ? ?Family History:  The patient's family history includes Heart attack (age of onset: 67) in his paternal grandfather; Heart attack (age of onset: 43) in his father; Hypertension in his father, mother, and sister.  ? ?ROS:  Please see the history of present illness.   All other systems are personally reviewed and  negative.  ? ?Recent Labs: ?04/06/2021: ALT 43 ?07/05/2021: B Natriuretic Peptide 3,398.7; BUN 32; Creatinine, Ser 2.34; Hemoglobin 14.1; Platelets 280; Potassium 3.7; Sodium 136; TSH 2.322  ?Personally reviewed  ? ?Wt Readings from Last 3 Encounters:  ?07/05/21 104.1 kg (229 lb 9.6 oz)  ?05/16/21 106.1 kg (234 lb)  ?05/14/21 106.1 kg (234 lb)  ?  ?BP 102/88   Pulse (!) 110   Wt 104.1 kg (229 lb 9.6 oz)   SpO2 96%   BMI 33.91 kg/m?  ? ?Physical Exam: ?General:  Well appearing. Ambulated into clinic with a walker ?HEENT: normal ?Neck: supple. no JVD. Carotids 2+ bilat; no bruits.  ?Cor: PMI nondisplaced. Regular rate & rhythm, tachy. No rubs, gallops or murmurs. ?Lungs: clear ?Abdomen: soft, nontender, + distended. No hepatosplenomegaly.  ?Extremities: no cyanosis, clubbing, rash, edema ?Neuro: alert & orientedx3, cranial nerves grossly intact. moves all 4 extremities w/o difficulty. Affect pleasant ? ? ?Assessment/Plan: ? ?1. Chronic systolic HF - NICM  ?- Cath ok performed [place] 2016. 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  ?- Echo 10/2016 EF 10-15%. LV dysfunction after stopping HF meds. ?- Echo 04/16/2017 EF 15-20% ?- cMRI 2/19  EF 19%. RV mildly decreased .  ?- s/p [employer] ICD 07/03/17. Had ICD shock with VT 07/08/17 ?- Echo 10/20 EF: 15% with moderate RV dysfunction ?- CPX 11/20 with mild functional limitation; no clear HF limitation despite EF

## 2021-07-05 ENCOUNTER — Telehealth (HOSPITAL_COMMUNITY): Payer: Self-pay | Admitting: Surgery

## 2021-07-05 ENCOUNTER — Encounter (HOSPITAL_COMMUNITY): Payer: Self-pay

## 2021-07-05 ENCOUNTER — Ambulatory Visit (HOSPITAL_COMMUNITY)
Admission: RE | Admit: 2021-07-05 | Discharge: 2021-07-05 | Disposition: A | Discharge status: Home / Self Care | Payer: Medicaid Other | Source: Ambulatory Visit | Attending: Physician Assistant | Admitting: Physician Assistant

## 2021-07-05 VITALS — BP 102/88 | HR 110 | Wt 229.6 lb

## 2021-07-05 DIAGNOSIS — Z79899 Other long term (current) drug therapy: Secondary | ICD-10-CM | POA: Insufficient documentation

## 2021-07-05 DIAGNOSIS — G4733 Obstructive sleep apnea (adult) (pediatric): Secondary | ICD-10-CM | POA: Insufficient documentation

## 2021-07-05 DIAGNOSIS — I428 Other cardiomyopathies: Secondary | ICD-10-CM | POA: Insufficient documentation

## 2021-07-05 DIAGNOSIS — I130 Hypertensive heart and chronic kidney disease with heart failure and stage 1 through stage 4 chronic kidney disease, or unspecified chronic kidney disease: Secondary | ICD-10-CM | POA: Insufficient documentation

## 2021-07-05 DIAGNOSIS — F1010 Alcohol abuse, uncomplicated: Secondary | ICD-10-CM | POA: Diagnosis not present

## 2021-07-05 DIAGNOSIS — I513 Intracardiac thrombosis, not elsewhere classified: Secondary | ICD-10-CM | POA: Diagnosis not present

## 2021-07-05 DIAGNOSIS — I219 Acute myocardial infarction, unspecified: Secondary | ICD-10-CM | POA: Insufficient documentation

## 2021-07-05 DIAGNOSIS — I5022 Chronic systolic (congestive) heart failure: Secondary | ICD-10-CM | POA: Insufficient documentation

## 2021-07-05 DIAGNOSIS — I472 Ventricular tachycardia, unspecified: Secondary | ICD-10-CM

## 2021-07-05 DIAGNOSIS — M109 Gout, unspecified: Secondary | ICD-10-CM | POA: Insufficient documentation

## 2021-07-05 DIAGNOSIS — F419 Anxiety disorder, unspecified: Secondary | ICD-10-CM | POA: Insufficient documentation

## 2021-07-05 DIAGNOSIS — F32A Depression, unspecified: Secondary | ICD-10-CM | POA: Diagnosis not present

## 2021-07-05 DIAGNOSIS — Z7901 Long term (current) use of anticoagulants: Secondary | ICD-10-CM | POA: Diagnosis not present

## 2021-07-05 LAB — CBC
HCT: 40.1 % (ref 39.0–52.0)
Hemoglobin: 14.1 g/dL (ref 13.0–17.0)
MCH: 32.1 pg (ref 26.0–34.0)
MCHC: 35.2 g/dL (ref 30.0–36.0)
MCV: 91.3 fL (ref 80.0–100.0)
Platelets: 280 10*3/uL (ref 150–400)
RBC: 4.39 MIL/uL (ref 4.22–5.81)
RDW: 17.1 % — ABNORMAL HIGH (ref 11.5–15.5)
WBC: 9.4 10*3/uL (ref 4.0–10.5)
nRBC: 0.5 % — ABNORMAL HIGH (ref 0.0–0.2)

## 2021-07-05 LAB — BASIC METABOLIC PANEL
Anion gap: 13 (ref 5–15)
BUN: 32 mg/dL — ABNORMAL HIGH (ref 6–20)
CO2: 24 mmol/L (ref 22–32)
Calcium: 8.6 mg/dL — ABNORMAL LOW (ref 8.9–10.3)
Chloride: 99 mmol/L (ref 98–111)
Creatinine, Ser: 2.34 mg/dL — ABNORMAL HIGH (ref 0.61–1.24)
GFR, Estimated: 33 mL/min — ABNORMAL LOW (ref >60)
Glucose, Bld: 100 mg/dL — ABNORMAL HIGH (ref 70–99)
Potassium: 3.7 mmol/L (ref 3.5–5.1)
Sodium: 136 mmol/L (ref 135–145)

## 2021-07-05 LAB — BRAIN NATRIURETIC PEPTIDE: B Natriuretic Peptide: 3398.7 pg/mL — ABNORMAL HIGH (ref 0.0–100.0)

## 2021-07-05 LAB — TSH: TSH: 2.322 u[IU]/mL (ref 0.350–4.500)

## 2021-07-05 MED ORDER — POTASSIUM CHLORIDE CRYS ER 20 MEQ PO TBCR: 40.0000 meq | EXTENDED_RELEASE_TABLET | Freq: Every day | ORAL | 6 refills | Status: DC

## 2021-07-05 MED ORDER — MEXILETINE HCL 150 MG PO CAPS: 300.0000 mg | ORAL_CAPSULE | Freq: Two times a day (BID) | ORAL | 2 refills | Status: AC

## 2021-07-05 NOTE — Telephone Encounter (Signed)
Patient called to inform of results and recommendations per Marlyce Huge PA.  Patient is aware of medication changes and new scheduled appts for repeat labwork and follow-up appt. I have updated patients medication list in [facility].  ?

## 2021-07-05 NOTE — Telephone Encounter (Signed)
-----   Message from Andrey Farmer, [place] sent at 07/05/2021 11:45 AM EDT ----- ?BNP significantly elevated. His ReDS was okay today at 32%. Didn't appear volume up on exam. His scr is up to 2.3. Takes 40 mg furosemide daily. Increase furosemide to 40 mg BID. K 3.7. Start 40 mEq K daily.  He needs BMET in 1 week and f/u in 2-3 weeks. ?

## 2021-07-05 NOTE — Progress Notes (Signed)
ReDS Vest / Clip - 07/05/21 0900   ? ?  ? ReDS Vest / Clip  ? Station Marker D   ? Ruler Value 40   ? ReDS Value Range Low volume   ? ReDS Actual Value 32   ? ?  ?  ? ?  ? ? ?

## 2021-07-05 NOTE — Patient Instructions (Signed)
EKG done today. ? ?RedsClip done today. ? ?Labs done today. We will contact you only if your labs are abnormal. ? ?INCREASE Mexiletine to 300mg  (2 tablets) by mouth 2 times daily.  ? ?No other medication changes were made. Please continue all current medications as prescribed. ? ?Your physician recommends that you schedule a follow-up appointment in: 2 months with our NP/PA Clinic here in our office. ? ?If you have any questions or concerns before your next appointment please send a message through [place] or call our office at [phone].   ? ?TO LEAVE A MESSAGE FOR THE NURSE SELECT OPTION 2, PLEASE LEAVE A MESSAGE INCLUDING: ?YOUR NAME ?DATE OF BIRTH ?CALL BACK NUMBER ?REASON FOR CALL**this is important as we prioritize the call backs ? ?YOU WILL RECEIVE A CALL BACK THE SAME DAY AS LONG AS YOU CALL BEFORE 4:00 PM ? ? ?Do the following things EVERYDAY: ?Weigh yourself in the morning before breakfast. Write it down and keep it in a log. ?Take your medicines as prescribed ?Eat low salt foods--Limit salt (sodium) to 2000 mg per day.  ?Stay as active as you can everyday ?Limit all fluids for the day to less than 2 liters ? ? ?At the Advanced Heart Failure Clinic, you and your health needs are our priority. As part of our continuing mission to provide you with exceptional heart care, we have created designated Provider Care Teams. These Care Teams include your primary Cardiologist (physician) and Advanced Practice Providers (APPs- Physician Assistants and Nurse Practitioners) who all work together to provide you with the care you need, when you need it.  ? ?You may see any of the following providers on your designated Care Team at your next follow up: ?Dr [phone] ?Dr Arvilla Meres ?Marca Ancona, NP ?Tonye Becket, PA ?Robbie Lis, PharmD ? ? ?Please be sure to bring in all your medications bottles to every appointment.  ? ?

## 2021-07-06 ENCOUNTER — Telehealth (HOSPITAL_COMMUNITY): Payer: Self-pay | Admitting: Physician Assistant

## 2021-07-06 NOTE — Addendum Note (Signed)
Encounter addended by: Andrey Farmer, PA-C on: 07/06/2021 4:00 PM ? Actions taken: Clinical Note Signed

## 2021-07-06 NOTE — Telephone Encounter (Signed)
Reviewed labs from yesterday with Dr. Gala Romney. His BNP is up significantly from prior, was 42>>>now greater than 3,000. We increased diuretics. Recommend repeating echo to check heart squeezing function and heart valves. ?

## 2021-07-06 NOTE — Telephone Encounter (Signed)
Left message to call back  

## 2021-07-12 ENCOUNTER — Other Ambulatory Visit (HOSPITAL_COMMUNITY): Payer: Medicaid Other

## 2021-07-14 NOTE — Addendum Note (Signed)
Encounter addended by: Dolores Patty, MD on: 07/14/2021 10:28 PM ? Actions taken: Clinical Note Signed, Level of Service modified

## 2021-07-16 ENCOUNTER — Other Ambulatory Visit (HOSPITAL_COMMUNITY): Payer: Medicaid Other

## 2021-07-19 ENCOUNTER — Other Ambulatory Visit (HOSPITAL_COMMUNITY): Payer: Medicaid Other

## 2021-07-25 ENCOUNTER — Telehealth (HOSPITAL_COMMUNITY): Payer: Self-pay

## 2021-07-25 NOTE — Telephone Encounter (Signed)
Called to confirm/remind patient of their appointment at the Advanced Heart Failure Clinic on 07/26/21.  ? ?Patient reminded to bring all medications and/or complete list. ? ?Confirmed patient has transportation. Gave directions, instructed to utilize valet parking. ? ?Confirmed appointment prior to ending call.  ? ?

## 2021-07-25 NOTE — Progress Notes (Addendum)
? ?Advanced Heart Failure Clinic Note ? ?Date:  07/26/2021  ? ?ID:  Robert Booth, DOB 10-10-1970, MRN [id number]  Location: Home  ?Provider location: [facility] ?Type of Visit: Established patient ? ?PCP:  Charlott Rakes, MD  ?Psych: Elbert Ewings, NP [facility] ?EP: Dr. Lovena Le ?HF Cardiologist: Dr. Haroldine Laws ? ?Chief Complaint: CHF follow-up ?  ?History of Present Illness: ?Robert Booth is a 51 y.o.male with hx of systolic HF with NICM secondary to prolonged ETOH abuse, EF 15% and CKD stage 2. ? ?EF improved to 35-40% in 7/16 then stopped taking meds and EF back down  ? ?In 8/16, he presented to [facility] with hemoptysis. Found to have RLL PNA and cardiogenic shock. Treated briefly with milrinone and levophed. Diuresed well. Discharge weight 177 .  ?  ?Hospitalized 9/28 with recurrent A/C systolic heart failure in the setting of medication noncompliance. He had been out of medications for 4 months. Echo EF 10-15%. Diuresed with IV lasix. HF meds restarted. D/c weight 177 lbs ?  ?Echo 1/19 EF ~20%. RV moderately dilated. Pt underwent ICD placement 07/03/17 - [employer]. ?  ?Admitted 4/19 with ICD shock. Had VT with ATP x 2 and shock x 1. Electrolytes stable. No clear inciting cause. ?  ?Echo 12/2018 EF: 15% RV moderately HK No LV thrombus.  ? ?CPX 11/20 showed mild limitations due by body habitus and deconditioning. No clear HF limitation. ? ?Incarcerated for 20 months and released 12/2020. ? 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 Per chart review, Echo (2/21) at [facility] showed EF <20%, severe global HK, grade 3 DD, mild/moderate MR, mild PR. He followed with Dr. Winfred Leeds [facility] ICD checks as [facility] not [facility]. ? ?Echo 03/01/21 EF 20-25% RV moderately reduced ? ?Sleep study 02/23: Moderate OSA ? ?Last seen for f/u 07/05/21. Multiple runs of sustained VT on device check. Mexiletine increased to 300 mg BID. Felt okay. Did not  appear significantly overloaded on exam or by ReDS, however BNP significantly increased (42, >3,000 in 2 months). Scr above baseline at 2.3. Furosemide increased to 40 mg BID.  ? ?Here today for close follow-up. Feels horrible. About 2 weeks ago began noticing worsening shortness of breath and cough. He thought symptoms would improve on their own and has been napping a lot. Yesterday began having intermittent hemoptysis. Now having dyspnea at rest and is visibly uncomfortable. Notes UOP did not improve much after increasing his diuretics at last visit. He barely has energy to stand. Right foot is very tender to touch and is swollen. ? ?ICD interrogation: Heart Logic Index 76 (started trending up first week of April), thoracic impedance down, activity level 1.2 hrs a day, one 13 second run NSVT since last device check ? ? ?CPX done 11/20  ?Peak VO2: 22.2 (69% predicted peak VO2) - corrects 25.6 for ibw ?VE/VCO2 slope:  26  ?OUES: 2.57  ?Peak RER: 1.07  ? ?Previous studies: ?Echo 7/16 EF 20% ?Echo 19-Mar-2023 EF 35 - 40% No LV clot ?Echo 8/17 EF 35% ?Echo 9/18 EF 10-15%. ?Echo 1/19 EF 15-20% ?Echo 10/20 EF 15% RV moderately HK No LV thrombus.  ?Echo (2/21) at [facility], EF <20%, severe global HK, grade 3 DD, mild/moderate MR, mild PR. ?  ?CMRI 05/20/2017  ?1. Mild to moderately dilated LV with EF 19%. Diffuse hypokinesis ?with septal-lateral dyssynchrony. There is a small thinned area of ?the basal inferoseptal wall, there does not appear to be an  actual ?VSD. No LV thrombus noted.  ?2.  Normal RV size with mildly decreased systolic function.  ?3. Noncoronary LGE pattern as noted above. This could be suggestive ?of prior myocarditis, less likely cardiac sarcoidosis. ? ?Past Medical History:  ?Diagnosis Date  ? AICD (automatic cardioverter/defibrillator) present 07/03/2017  ? Anxiety   ? CHF (congestive heart failure) ([place])   ? Essential hypertension   ? History of alcohol abuse   ? a. Sober since Feb 2016.  ? LV (left ventricular)  mural thrombus 07/2014  ? Documented n [place]  ? NICM (nonischemic cardiomyopathy) ([place])   ? a. 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  ? ?Past Surgical History:  ?Procedure Laterality Date  ? CARDIAC CATHETERIZATION  05/2014  ? In [place], clean cors  ? HERNIA REPAIR Right Inguinal  ? ICD IMPLANT N/A 07/03/2017  ? Procedure: ICD IMPLANT;  Surgeon: Evans Lance, MD;  Location: [place] [facility];  Service: Cardiovascular;  Laterality: N/A;  ? KNEE SURGERY Right 1998  ? ?Current Outpatient Medications  ?Medication Sig Dispense Refill  ? allopurinol (ZYLOPRIM) 300 MG tablet TAKE 1 TABLET (300 MG TOTAL) BY MOUTH DAILY. 30 tablet 2  ? apixaban (ELIQUIS) 5 MG TABS tablet Take 1 tablet (5 mg total) by mouth 2 (two) times daily. 60 tablet 6  ? atorvastatin (LIPITOR) 20 MG tablet TAKE 1 TABLET (20 MG TOTAL) BY MOUTH DAILY. 30 tablet 3  ? Blood Pressure Monitoring (BLOOD PRESSURE CUFF) MISC 1 Device by Does not apply route as needed. 1 each 0  ? carvedilol (COREG) 12.5 MG tablet Take 1 tablet (12.5 mg total) by mouth 2 (two) times daily with a meal. 60 tablet 6  ? dapagliflozin propanediol (FARXIGA) 10 MG TABS tablet Take 1 tablet (10 mg total) by mouth daily before breakfast. 90 tablet 3  ? digoxin (LANOXIN) 0.125 MG tablet Take 1 tablet (0.125 mg total) by mouth daily. 30 tablet 6  ? furosemide (LASIX) 20 MG tablet Take 40 mg by mouth 2 (two) times daily.    ? gabapentin (NEURONTIN) 300 MG capsule Take 2 capsules (600 mg total) by mouth 2 (two) times daily. 120 capsule 3  ? hydrALAZINE (APRESOLINE) 25 MG tablet Take 1 tablet (25 mg total) by mouth 3 (three) times daily. 270 tablet 3  ? hydrOXYzine (VISTARIL) 25 MG capsule Take 25 mg by mouth 3 (three) times daily as needed for anxiety.    ? mexiletine (MEXITIL) 150 MG capsule Take 2  capsules (300 mg total) by mouth 2 (two) times daily. 120 capsule 2  ? pantoprazole (PROTONIX) 40 MG tablet Take 1 tablet (40 mg total) by mouth daily. 30 tablet 11  ? potassium chloride SA (KLOR-CON M) 20 MEQ tablet Take 2 tablets (40 mEq total) by mouth daily. 60 tablet 6  ? sacubitril-valsartan (ENTRESTO) 49-51 MG Take 1 tablet by mouth 2 (two) times daily. 60 tablet 6  ? sertraline (ZOLOFT) 100 MG tablet Take 200 mg by mouth daily.    ? spironolactone (ALDACTONE) 25 MG tablet Take 1 tablet (25 mg total) by mouth daily. 30 tablet 6  ? ?No current facility-administered medications for this encounter.  ? ?Allergies:   Celexa [citalopram hydrobromide], Effexor [venlafaxine], and Lisinopril  ? ?Social History:  The patient  reports that he has quit smoking. His smoking use included cigarettes.  He has quit using smokeless tobacco.  His smokeless tobacco use included chew. He reports that he does not drink alcohol and does not use drugs.  ? ?Family History:  The patient's family history includes Heart attack (age of onset: 56) in his paternal grandfather; Heart attack (age of onset: 78) in his father; Hypertension in his father, mother, and sister.  ? ?ROS:  Please see the history of present illness.   All other systems are personally reviewed and negative.  ? ?Recent Labs: ?04/06/2021: ALT 43 ?07/05/2021: B Natriuretic Peptide 3,398.7; BUN 32; Creatinine, Ser 2.34; Hemoglobin 14.1; Platelets 280; Potassium 3.7; Sodium 136; TSH 2.322  ?Personally reviewed  ? ?Wt Readings from Last 3 Encounters:  ?07/26/21 102.2 kg (225 lb 3.2 oz)  ?07/05/21 104.1 kg (229 lb 9.6 oz)  ?05/16/21 106.1 kg (234 lb)  ?  ?BP 98/72   Pulse (!) 113   Wt 102.2 kg (225 lb 3.2 oz)   SpO2 97%   BMI 33.26 kg/m?  ? ?Physical Exam: ?General:  Appears unwell. In mild to moderate distress ?HEENT: normal ?Neck: supple. JVP to ear. Carotids 2+ bilat; no bruits. No lymphadenopathy or thryomegaly appreciated. ?Cor: PMI nondisplaced. Regular rate & rhythm,  tachy. No rubs, gallops or murmurs. ?Lungs: bibasilar crackles, produces blood tinged sputum in clinic ?Abdomen: + distended. No hepatosplenomegaly.  ?Extremities: no cyanosis, clubbing, rash, trace edema, s/p bi

## 2021-07-26 ENCOUNTER — Inpatient Hospital Stay: Payer: Self-pay

## 2021-07-26 ENCOUNTER — Encounter (HOSPITAL_COMMUNITY): Payer: Self-pay

## 2021-07-26 ENCOUNTER — Inpatient Hospital Stay (HOSPITAL_COMMUNITY)
Admission: AD | Admit: 2021-07-26 | Discharge: 2021-08-16 | DRG: 286 | Disposition: A | Discharge status: Home / Self Care | Payer: Medicaid Other | Source: Ambulatory Visit | Attending: Internal Medicine | Admitting: Internal Medicine

## 2021-07-26 ENCOUNTER — Inpatient Hospital Stay (HOSPITAL_COMMUNITY): Payer: Medicaid Other

## 2021-07-26 ENCOUNTER — Ambulatory Visit (HOSPITAL_COMMUNITY)
Admission: RE | Admit: 2021-07-26 | Discharge: 2021-07-26 | Disposition: A | Discharge status: Home / Self Care | Payer: Medicaid Other | Source: Ambulatory Visit | Attending: Physician Assistant | Admitting: Physician Assistant

## 2021-07-26 VITALS — BP 98/72 | HR 113 | Wt 225.2 lb

## 2021-07-26 DIAGNOSIS — I130 Hypertensive heart and chronic kidney disease with heart failure and stage 1 through stage 4 chronic kidney disease, or unspecified chronic kidney disease: Secondary | ICD-10-CM | POA: Diagnosis present

## 2021-07-26 DIAGNOSIS — F419 Anxiety disorder, unspecified: Secondary | ICD-10-CM | POA: Diagnosis present

## 2021-07-26 DIAGNOSIS — F32A Depression, unspecified: Secondary | ICD-10-CM | POA: Diagnosis present

## 2021-07-26 DIAGNOSIS — E875 Hyperkalemia: Secondary | ICD-10-CM | POA: Diagnosis present

## 2021-07-26 DIAGNOSIS — R7989 Other specified abnormal findings of blood chemistry: Secondary | ICD-10-CM | POA: Diagnosis present

## 2021-07-26 DIAGNOSIS — F1011 Alcohol abuse, in remission: Secondary | ICD-10-CM | POA: Diagnosis not present

## 2021-07-26 DIAGNOSIS — Z95811 Presence of heart assist device: Secondary | ICD-10-CM | POA: Diagnosis not present

## 2021-07-26 DIAGNOSIS — N179 Acute kidney failure, unspecified: Secondary | ICD-10-CM

## 2021-07-26 DIAGNOSIS — J9811 Atelectasis: Secondary | ICD-10-CM | POA: Diagnosis present

## 2021-07-26 DIAGNOSIS — I5084 End stage heart failure: Secondary | ICD-10-CM | POA: Diagnosis present

## 2021-07-26 DIAGNOSIS — J189 Pneumonia, unspecified organism: Secondary | ICD-10-CM | POA: Diagnosis present

## 2021-07-26 DIAGNOSIS — I240 Acute coronary thrombosis not resulting in myocardial infarction: Secondary | ICD-10-CM | POA: Diagnosis not present

## 2021-07-26 DIAGNOSIS — E876 Hypokalemia: Secondary | ICD-10-CM | POA: Diagnosis not present

## 2021-07-26 DIAGNOSIS — Z515 Encounter for palliative care: Secondary | ICD-10-CM | POA: Diagnosis not present

## 2021-07-26 DIAGNOSIS — I428 Other cardiomyopathies: Secondary | ICD-10-CM | POA: Diagnosis present

## 2021-07-26 DIAGNOSIS — Z7189 Other specified counseling: Secondary | ICD-10-CM | POA: Diagnosis not present

## 2021-07-26 DIAGNOSIS — Z01810 Encounter for preprocedural cardiovascular examination: Secondary | ICD-10-CM | POA: Diagnosis not present

## 2021-07-26 DIAGNOSIS — G4733 Obstructive sleep apnea (adult) (pediatric): Secondary | ICD-10-CM | POA: Diagnosis present

## 2021-07-26 DIAGNOSIS — Z66 Do not resuscitate: Secondary | ICD-10-CM | POA: Diagnosis present

## 2021-07-26 DIAGNOSIS — Z8249 Family history of ischemic heart disease and other diseases of the circulatory system: Secondary | ICD-10-CM

## 2021-07-26 DIAGNOSIS — Z888 Allergy status to other drugs, medicaments and biological substances status: Secondary | ICD-10-CM

## 2021-07-26 DIAGNOSIS — I5082 Biventricular heart failure: Secondary | ICD-10-CM | POA: Diagnosis present

## 2021-07-26 DIAGNOSIS — F1010 Alcohol abuse, uncomplicated: Secondary | ICD-10-CM | POA: Diagnosis present

## 2021-07-26 DIAGNOSIS — R570 Cardiogenic shock: Secondary | ICD-10-CM | POA: Diagnosis present

## 2021-07-26 DIAGNOSIS — R042 Hemoptysis: Secondary | ICD-10-CM | POA: Diagnosis present

## 2021-07-26 DIAGNOSIS — N1831 Chronic kidney disease, stage 3a: Secondary | ICD-10-CM | POA: Diagnosis present

## 2021-07-26 DIAGNOSIS — Z87891 Personal history of nicotine dependence: Secondary | ICD-10-CM

## 2021-07-26 DIAGNOSIS — I5021 Acute systolic (congestive) heart failure: Secondary | ICD-10-CM

## 2021-07-26 DIAGNOSIS — Z95810 Presence of automatic (implantable) cardiac defibrillator: Secondary | ICD-10-CM

## 2021-07-26 DIAGNOSIS — M109 Gout, unspecified: Secondary | ICD-10-CM | POA: Diagnosis present

## 2021-07-26 DIAGNOSIS — I513 Intracardiac thrombosis, not elsewhere classified: Secondary | ICD-10-CM

## 2021-07-26 DIAGNOSIS — R531 Weakness: Secondary | ICD-10-CM | POA: Diagnosis not present

## 2021-07-26 DIAGNOSIS — Z89422 Acquired absence of other left toe(s): Secondary | ICD-10-CM

## 2021-07-26 DIAGNOSIS — Z91148 Patient's other noncompliance with medication regimen for other reason: Secondary | ICD-10-CM

## 2021-07-26 DIAGNOSIS — I472 Ventricular tachycardia, unspecified: Secondary | ICD-10-CM

## 2021-07-26 DIAGNOSIS — I5023 Acute on chronic systolic (congestive) heart failure: Secondary | ICD-10-CM | POA: Diagnosis present

## 2021-07-26 DIAGNOSIS — Z79899 Other long term (current) drug therapy: Secondary | ICD-10-CM

## 2021-07-26 DIAGNOSIS — Z7901 Long term (current) use of anticoagulants: Secondary | ICD-10-CM

## 2021-07-26 DIAGNOSIS — Z89421 Acquired absence of other right toe(s): Secondary | ICD-10-CM

## 2021-07-26 DIAGNOSIS — R0600 Dyspnea, unspecified: Secondary | ICD-10-CM | POA: Diagnosis not present

## 2021-07-26 LAB — CBC
HCT: 40.3 % (ref 39.0–52.0)
Hemoglobin: 13.8 g/dL (ref 13.0–17.0)
MCH: 31.2 pg (ref 26.0–34.0)
MCHC: 34.2 g/dL (ref 30.0–36.0)
MCV: 91.2 fL (ref 80.0–100.0)
Platelets: 363 10*3/uL (ref 150–400)
RBC: 4.42 MIL/uL (ref 4.22–5.81)
RDW: 16.3 % — ABNORMAL HIGH (ref 11.5–15.5)
WBC: 15.1 10*3/uL — ABNORMAL HIGH (ref 4.0–10.5)
nRBC: 0 % (ref 0.0–0.2)

## 2021-07-26 LAB — ECHOCARDIOGRAM COMPLETE
Area-P 1/2: 5.97 cm2
MV M vel: 3.34 m/s
MV Peak grad: 44.6 mmHg
Radius: 0.35 cm
S' Lateral: 6.7 cm
Weight: 3513.25 oz

## 2021-07-26 LAB — GLUCOSE, CAPILLARY: Glucose-Capillary: 106 mg/dL — ABNORMAL HIGH (ref 70–99)

## 2021-07-26 LAB — COOXEMETRY PANEL
Carboxyhemoglobin: 1.6 % — ABNORMAL HIGH (ref 0.5–1.5)
Methemoglobin: <0.7 % (ref 0.0–1.5)
O2 Saturation: 63.1 %
Total hemoglobin: 14.4 g/dL (ref 12.0–16.0)

## 2021-07-26 LAB — URINALYSIS, ROUTINE W REFLEX MICROSCOPIC
Bilirubin Urine: NEGATIVE
Glucose, UA: NEGATIVE mg/dL
Hgb urine dipstick: NEGATIVE
Ketones, ur: NEGATIVE mg/dL
Leukocytes,Ua: NEGATIVE
Nitrite: NEGATIVE
Protein, ur: NEGATIVE mg/dL
Specific Gravity, Urine: 1.005 (ref 1.005–1.030)
pH: 5 (ref 5.0–8.0)

## 2021-07-26 LAB — COMPREHENSIVE METABOLIC PANEL
ALT: 205 U/L — ABNORMAL HIGH (ref 0–44)
AST: 93 U/L — ABNORMAL HIGH (ref 15–41)
Albumin: 2.9 g/dL — ABNORMAL LOW (ref 3.5–5.0)
Alkaline Phosphatase: 96 U/L (ref 38–126)
Anion gap: 9 (ref 5–15)
BUN: 54 mg/dL — ABNORMAL HIGH (ref 6–20)
CO2: 23 mmol/L (ref 22–32)
Calcium: 8.6 mg/dL — ABNORMAL LOW (ref 8.9–10.3)
Chloride: 105 mmol/L (ref 98–111)
Creatinine, Ser: 2.35 mg/dL — ABNORMAL HIGH (ref 0.61–1.24)
GFR, Estimated: 33 mL/min — ABNORMAL LOW (ref >60)
Glucose, Bld: 79 mg/dL (ref 70–99)
Potassium: 6.1 mmol/L — ABNORMAL HIGH (ref 3.5–5.1)
Sodium: 137 mmol/L (ref 135–145)
Total Bilirubin: 1.4 mg/dL — ABNORMAL HIGH (ref 0.3–1.2)
Total Protein: 6.6 g/dL (ref 6.5–8.1)

## 2021-07-26 LAB — HIV ANTIBODY (ROUTINE TESTING W REFLEX): HIV Screen 4th Generation wRfx: NONREACTIVE

## 2021-07-26 LAB — LACTIC ACID, PLASMA: Lactic Acid, Venous: 2.9 mmol/L (ref 0.5–1.9)

## 2021-07-26 LAB — PROCALCITONIN: Procalcitonin: 1.46 ng/mL

## 2021-07-26 LAB — MRSA NEXT GEN BY PCR, NASAL: MRSA by PCR Next Gen: NOT DETECTED

## 2021-07-26 LAB — BRAIN NATRIURETIC PEPTIDE: B Natriuretic Peptide: 1528 pg/mL — ABNORMAL HIGH (ref 0.0–100.0)

## 2021-07-26 MED ORDER — MILRINONE LACTATE IN DEXTROSE 20-5 MG/100ML-% IV SOLN
INTRAVENOUS | Status: AC
  Administered 2021-07-26: 0.25 ug/kg/min via INTRAVENOUS
  Filled 2021-07-26: qty 100

## 2021-07-26 MED ORDER — GUAIFENESIN-DM 100-10 MG/5ML PO SYRP
15.0000 mL | ORAL_SOLUTION | ORAL | Status: DC | PRN
  Administered 2021-07-26 – 2021-08-03 (×10): 15 mL via ORAL
  Filled 2021-07-26 (×11): qty 15

## 2021-07-26 MED ORDER — SODIUM ZIRCONIUM CYCLOSILICATE 10 G PO PACK
10.0000 g | PACK | Freq: Once | ORAL | Status: AC
  Administered 2021-07-26: 10 g via ORAL
  Filled 2021-07-26: qty 1

## 2021-07-26 MED ORDER — GABAPENTIN 300 MG PO CAPS
600.0000 mg | ORAL_CAPSULE | Freq: Two times a day (BID) | ORAL | Status: DC
  Administered 2021-07-26 – 2021-08-16 (×41): 600 mg via ORAL
  Filled 2021-07-26 (×41): qty 2

## 2021-07-26 MED ORDER — MILRINONE LACTATE IN DEXTROSE 20-5 MG/100ML-% IV SOLN
0.2500 ug/kg/min | INTRAVENOUS | Status: DC
  Administered 2021-07-27 – 2021-07-29 (×6): 0.25 ug/kg/min via INTRAVENOUS
  Filled 2021-07-26 (×6): qty 100

## 2021-07-26 MED ORDER — SERTRALINE HCL 100 MG PO TABS
200.0000 mg | ORAL_TABLET | Freq: Every day | ORAL | Status: DC
  Administered 2021-07-27 – 2021-08-16 (×20): 200 mg via ORAL
  Filled 2021-07-26 (×20): qty 2

## 2021-07-26 MED ORDER — SODIUM ZIRCONIUM CYCLOSILICATE 5 G PO PACK: 5.0000 g | PACK | Freq: Once | ORAL | Status: DC

## 2021-07-26 MED ORDER — PERFLUTREN LIPID MICROSPHERE: 1.0000 mL | INTRAVENOUS | Status: DC | PRN

## 2021-07-26 MED ORDER — ACETAMINOPHEN 325 MG PO TABS
650.0000 mg | ORAL_TABLET | ORAL | Status: DC | PRN
  Administered 2021-07-26 – 2021-08-12 (×11): 650 mg via ORAL
  Filled 2021-07-26 (×12): qty 2

## 2021-07-26 MED ORDER — CHLORHEXIDINE GLUCONATE CLOTH 2 % EX PADS
6.0000 | MEDICATED_PAD | Freq: Every day | CUTANEOUS | Status: DC
  Administered 2021-07-26 – 2021-08-15 (×21): 6 via TOPICAL

## 2021-07-26 MED ORDER — SODIUM CHLORIDE 0.9% FLUSH: 10.0000 mL | INTRAVENOUS | Status: DC | PRN

## 2021-07-26 MED ORDER — ATORVASTATIN CALCIUM 10 MG PO TABS
20.0000 mg | ORAL_TABLET | Freq: Every day | ORAL | Status: DC
  Administered 2021-07-27 – 2021-08-16 (×20): 20 mg via ORAL
  Filled 2021-07-26 (×20): qty 2

## 2021-07-26 MED ORDER — SODIUM CHLORIDE 0.9 % IV SOLN
250.0000 mL | INTRAVENOUS | Status: DC | PRN
  Administered 2021-07-26: 250 mL via INTRAVENOUS

## 2021-07-26 MED ORDER — SODIUM CHLORIDE 0.9% FLUSH
3.0000 mL | Freq: Two times a day (BID) | INTRAVENOUS | Status: DC
  Administered 2021-07-26 – 2021-07-29 (×4): 3 mL via INTRAVENOUS

## 2021-07-26 MED ORDER — MEXILETINE HCL 150 MG PO CAPS
300.0000 mg | ORAL_CAPSULE | Freq: Two times a day (BID) | ORAL | Status: DC
  Administered 2021-07-26 – 2021-08-16 (×42): 300 mg via ORAL
  Filled 2021-07-26 (×43): qty 2

## 2021-07-26 MED ORDER — ONDANSETRON HCL 4 MG/2ML IJ SOLN: 4.0000 mg | Freq: Four times a day (QID) | INTRAMUSCULAR | Status: DC | PRN

## 2021-07-26 MED ORDER — SODIUM CHLORIDE 0.9% FLUSH
10.0000 mL | Freq: Two times a day (BID) | INTRAVENOUS | Status: DC
  Administered 2021-07-26 – 2021-07-29 (×4): 10 mL

## 2021-07-26 MED ORDER — SODIUM CHLORIDE 0.9% FLUSH: 3.0000 mL | INTRAVENOUS | Status: DC | PRN

## 2021-07-26 MED ORDER — FUROSEMIDE 10 MG/ML IJ SOLN
80.0000 mg | Freq: Two times a day (BID) | INTRAMUSCULAR | Status: DC
  Administered 2021-07-26 – 2021-07-27 (×3): 80 mg via INTRAVENOUS
  Filled 2021-07-26 (×3): qty 8

## 2021-07-26 MED ORDER — PANTOPRAZOLE SODIUM 40 MG PO TBEC
40.0000 mg | DELAYED_RELEASE_TABLET | Freq: Every day | ORAL | Status: DC
  Administered 2021-07-27 – 2021-08-16 (×20): 40 mg via ORAL
  Filled 2021-07-26 (×19): qty 1

## 2021-07-26 MED ORDER — APIXABAN 5 MG PO TABS
5.0000 mg | ORAL_TABLET | Freq: Two times a day (BID) | ORAL | Status: DC
  Administered 2021-07-26 – 2021-07-27 (×2): 5 mg via ORAL
  Filled 2021-07-26 (×2): qty 1

## 2021-07-26 NOTE — Addendum Note (Signed)
Encounter addended by: Andrey Farmer, PA-C on: 07/26/2021 1:38 PM ? Actions taken: Order Reconciliation Section accessed

## 2021-07-26 NOTE — Addendum Note (Signed)
Encounter addended by: Joette Catching, PA-C on: 07/26/2021 1:32 PM ? Actions taken: Clinical Note Signed

## 2021-07-26 NOTE — H&P (Addendum)
? ?Advanced Heart Failure Team History and Physical Note ?  ?PCP:  Charlott Rakes, MD  ?PCP-Cardiology: None    ? ?Reason for Admission: A/C HFrEF ? ? ?HPI:   ?[name] is a 51 year old with a history of HFrEF, NICM, [employer] ICD, ETOH, NSVT, gout, LV mural thrombus, and CKD Stage IIIa. 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  ? ?Over the last few years EF was down to 15% and had improved to 35-40% back in 2016. He stopped taking his medications and EF went back down to 15%.  ? ?He was seen in the HF clinic 07/05/21. Multiple runs of sustained VT on device check. Mexiletine increased to 300 mg BID. BNP significantly increased (42, >3,000 in 2 months). Scr above baseline at 2.3. Furosemide increased to 40 mg BID.  ? ?Presented to HF clinic today for routine f/u. Complaining of fatigue, dyspnea at rest and hemoptysis. Heart Logic Score 76. Admitted from HF clinic with suspected lowput  HF and volume overload. K 6.1, lactic acid 2.9, creatinine 2.3, AST 93 ALT 205, and WBC 15.  ? ?ICD interrogation: Heart Logic Index 76 (started trending up first week of April), thoracic impedance down, activity level 1.2 hrs a day, one 13 second run NSVT since last device check. ? ? ?Previous studies: ?Echo 7/16 EF 20% ?Echo 03-24-23 EF 35 - 40% No LV clot ?Echo 8/17 EF 35% ?Echo 9/18 EF 10-15%. ?Echo 1/19 EF 15-20% ?Echo 10/20 EF 15% RV moderately HK No LV thrombus.  ?Echo (2/21) at [facility], EF <20%, severe global HK, grade 3 DD, mild/moderate MR, mild PR. ?  ?CMRI 05/20/2017  ?1. Mild to moderately dilated LV with EF 19%. Diffuse hypokinesis ?with septal-lateral dyssynchrony. There is a small thinned area of ?the basal inferoseptal wall, there does not appear to be an actual ?VSD. No LV thrombus noted.  ?2.  Normal RV size with mildly decreased systolic function.  ?3. Noncoronary LGE pattern as noted above. This could be suggestive ?of prior myocarditis, less likely cardiac sarcoidosis. ? ?Review of Systems: [y]  = yes, [ ]  = no  ? ?General: Weight gain [ ] ; Weight loss [ ] ; Anorexia [ ] ; Fatigue [ Y]; Fever [ ] ; Chills [ ] ; Weakness [ Y]  ?Cardiac: Chest pain/pressure [ ] ; Resting SOB [ ] ; Exertional SOB [Y ]; Orthopnea [ Y]; Pedal Edema [Y ]; Palpitations [ ] ; Syncope [ ] ; Presyncope [ ] ; Paroxysmal nocturnal dyspnea[ ]   ?Pulmonary: Cough [ ] ; Wheezing[ ] ; Hemoptysis[ ] ; Sputum [ ] ; Snoring [ ]   ?GI: Vomiting[ ] ; Dysphagia[ ] ; Melena[ ] ; Hematochezia [ ] ; Heartburn[ ] ; Abdominal pain [ ] ; Constipation [ ] ; Diarrhea [ ] ; BRBPR [ ]   ?GU: Hematuria[ ] ; Dysuria [ ] ; Nocturia[ ]   ?Vascular: Pain in legs with walking [ ] ; Pain in feet with lying flat [ ] ; Non-healing sores [ ] ; Stroke [ ] ; TIA [ ] ; Slurred speech [ ] ;  ?Neuro: Headaches[ ] ; Vertigo[ ] ; Seizures[ ] ; Paresthesias[ ] ;Blurred vision [ ] ; Diplopia [ ] ; Vision changes [ ]   ?Ortho/Skin: Arthritis [ ] ; Joint pain [ Y]; Muscle pain [ ] ; Joint swelling [ ] ; Back Pain [ Y]; Rash [ ]   ?Psych: Depression[Y ]; Anxiety[ Y]  ?Heme: Bleeding problems [ ] ; Clotting disorders [ ] ; Anemia [ ]   ?Endocrine: Diabetes [ ] ; Thyroid dysfunction[ ]  ? ? ?Home Medications ?Prior to Admission medications   ?Medication Sig Start Date End Date Taking? Authorizing Provider  ?allopurinol (ZYLOPRIM) 300 MG  tablet TAKE 1 TABLET (300 MG TOTAL) BY MOUTH DAILY. 05/07/21   Robert Bihari, FNP  ?apixaban (ELIQUIS) 5 MG TABS tablet Take 1 tablet (5 mg total) by mouth 2 (two) times daily. 02/27/21   Robert Bihari, FNP  ?atorvastatin (LIPITOR) 20 MG tablet TAKE 1 TABLET (20 MG TOTAL) BY MOUTH DAILY. 05/07/21   Robert Bihari, FNP  ?Blood Pressure Monitoring (BLOOD PRESSURE CUFF) MISC 1 Device by Does not apply route as needed. 02/07/21   Larey Dresser, MD  ?carvedilol (COREG) 12.5 MG tablet Take 1 tablet (12.5 mg total) by mouth 2 (two) times daily with a meal. 02/15/21   Milford, Maricela Bo, FNP  ?dapagliflozin propanediol (FARXIGA) 10 MG TABS tablet Take 1 tablet (10 mg total) by mouth daily  before breakfast. 04/09/21   Rickie Gutierres, Shaune Pascal, MD  ?digoxin (LANOXIN) 0.125 MG tablet Take 1 tablet (0.125 mg total) by mouth daily. 02/27/21   Robert Bihari, FNP  ?furosemide (LASIX) 20 MG tablet Take 40 mg by mouth 2 (two) times daily.    [provider]  ?gabapentin (NEURONTIN) 300 MG capsule Take 2 capsules (600 mg total) by mouth 2 (two) times daily. 05/16/21   Charlott Rakes, MD  ?hydrALAZINE (APRESOLINE) 25 MG tablet Take 1 tablet (25 mg total) by mouth 3 (three) times daily. 02/26/21 07/06/22  Robert Bihari, FNP  ?hydrOXYzine (VISTARIL) 25 MG capsule Take 25 mg by mouth 3 (three) times daily as needed for anxiety.    [provider]  ?mexiletine (MEXITIL) 150 MG capsule Take 2 capsules (300 mg total) by mouth 2 (two) times daily. 07/05/21   Joette Catching, PA-C  ?pantoprazole (PROTONIX) 40 MG tablet Take 1 tablet (40 mg total) by mouth daily. 05/14/21   Ronon Ferger, Shaune Pascal, MD  ?potassium chloride SA (KLOR-CON M) 20 MEQ tablet Take 2 tablets (40 mEq total) by mouth daily. 07/05/21   Joette Catching, PA-C  ?sacubitril-valsartan (ENTRESTO) 49-51 MG Take 1 tablet by mouth 2 (two) times daily. 04/06/21   Tea Collums, Shaune Pascal, MD  ?sertraline (ZOLOFT) 100 MG tablet Take 200 mg by mouth daily.    [provider]  ?spironolactone (ALDACTONE) 25 MG tablet Take 1 tablet (25 mg total) by mouth daily. 03/14/21   Robert Bihari, FNP  ? ? ?Past Medical History: ?Past Medical History:  ?Diagnosis Date  ? AICD (automatic cardioverter/defibrillator) present 07/03/2017  ? Anxiety   ? CHF (congestive heart failure) ([place])   ? Essential hypertension   ? History of alcohol abuse   ? a. Sober since Feb 2016.  ? 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 Documented n [place]  ? NICM (nonischemic cardiomyopathy) ([place])   ? a. 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  ? ? ?Past Surgical History: ?Past Surgical History:  ?Procedure Laterality Date  ? CARDIAC CATHETERIZATION  05/2014  ? In [place], clean cors  ? HERNIA REPAIR Right Inguinal  ? ICD IMPLANT N/A 07/03/2017  ? Procedure: ICD IMPLANT;  Surgeon: Evans Lance, MD;  Location: [facility];  Service: Cardiovascular;  Laterality: N/A;  ? KNEE SURGERY Right 1998  ? ? ?Family History:  ?Family History  ?Problem Relation Age of Onset  ? Hypertension Mother   ? Hypertension Father   ? Heart attack Father 50  ?  died  ? Hypertension Sister   ? Heart attack Paternal Grandfather 7  ?     died  ? ? ?Social History: ?Social History  ? ?Socioeconomic History  ? Marital status: Single  ?  Spouse name: Not on file  ? Number of children: 6  ? Years of education: Not on file  ? Highest education level: Not on file  ?Occupational History  ? Occupation: Unemployed  ?Tobacco Use  ? Smoking status: Former  ?  Types: Cigarettes  ? Smokeless tobacco: Former  ?  Types: Chew  ? Tobacco comments:  ?  Was a rare smoker  ?Vaping Use  ? Vaping Use: Never used  ?Substance and Sexual Activity  ? Alcohol use: No  ?  Alcohol/week: 0.0 standard drinks  ? Drug use: No  ? Sexual activity: Yes  ?Other Topics Concern  ? Not on file  ?Social History Narrative  ? Lives with mom.    ? ?Social Determinants of Health  ? ?Financial Resource Strain: Not on file  ?Food Insecurity: Not on file  ?Transportation Needs: No Transportation Needs  ? Lack of Transportation (Medical): No  ? Lack of Transportation (Non-Medical): No  ?Physical Activity: Not on file  ?Stress: Not on file  ?Social Connections: Not on file  ? ? ?Allergies:  ?Allergies  ?Allergen Reactions  ? Celexa [Citalopram Hydrobromide] Other (See Comments)  ?  Makes the patient disoriented  ? Effexor [Venlafaxine] Other (See Comments)  ?  Altered patient's thinking process(confused)  ? Lisinopril Cough  ? ? ?Objective:   ? ?Vital  Signs:   ?Temp:  [98.2 ?F (36.8 ?C)] 98.2 ?F (36.8 ?C) (04/27 1345) ?Pulse Rate:  [102-111] 102 (04/27 1415) ?Resp:  [24-29] 24 (04/27 1415) ?BP: (103-106)/(76-84) 104/80 (04/27 1415) ?SpO2:  [98 %-99 %]

## 2021-07-26 NOTE — Progress Notes (Signed)
Peripherally Inserted Central Catheter Placement ? ?The IV Nurse has discussed with the patient and/or persons authorized to consent for the patient, the purpose of this procedure and the potential benefits and risks involved with this procedure.  The benefits include less needle sticks, lab draws from the catheter, and the patient may be discharged home with the catheter. Risks include, but not limited to, infection, bleeding, blood clot (thrombus formation), and puncture of an artery; nerve damage and irregular heartbeat and possibility to perform a PICC exchange if needed/ordered by physician.  Alternatives to this procedure were also discussed.  Bard Power PICC patient education guide, fact sheet on infection prevention and patient information card has been provided to patient /or left at bedside.  PICC inserted by Jake Samples, RN ? ? ?PICC Placement Documentation  ?PICC Double Lumen 07/26/21 Right Brachial 41 cm 1 cm (Active)  ?Indication for Insertion or Continuance of Line Vasoactive infusions;Chronic illness with exacerbations (CF, Sickle Cell, etc.) 07/26/21 1558  ?Exposed Catheter (cm) 1 cm 07/26/21 1558  ?Site Assessment Clean;Dry;Intact 07/26/21 1558  ?Lumen #1 Status Flushed;Saline locked;Blood return noted 07/26/21 1558  ?Lumen #2 Status Flushed;Saline locked;Blood return noted 07/26/21 1558  ?Dressing Type Securing device;Transparent 07/26/21 1558  ?Dressing Status Antimicrobial disc in place 07/26/21 1558  ?Safety Lock Not Applicable [id number]  ?Line Care Connections checked and tightened 07/26/21 1558  ?Dressing Intervention New dressing 07/26/21 1558  ?Dressing Change Due 08/02/21 07/26/21 1558  ? ? ? ? ? ?Robert Booth, Robert Booth ?07/26/2021, 3:59 PM ? ?

## 2021-07-26 NOTE — Addendum Note (Signed)
Encounter addended by: Andrey Farmer, PA-C on: 07/26/2021 1:39 PM ? Actions taken: Clinical Note Signed

## 2021-07-26 NOTE — Addendum Note (Signed)
Encounter addended by: Andrey Farmer, PA-C on: 07/26/2021 1:23 PM ? Actions taken: Clinical Note Signed

## 2021-07-27 ENCOUNTER — Other Ambulatory Visit: Payer: Self-pay

## 2021-07-27 ENCOUNTER — Encounter (HOSPITAL_COMMUNITY): Payer: Self-pay | Admitting: Internal Medicine

## 2021-07-27 DIAGNOSIS — I5023 Acute on chronic systolic (congestive) heart failure: Secondary | ICD-10-CM | POA: Diagnosis not present

## 2021-07-27 LAB — BASIC METABOLIC PANEL WITH GFR
Anion gap: 9 (ref 5–15)
BUN: 43 mg/dL — ABNORMAL HIGH (ref 6–20)
CO2: 26 mmol/L (ref 22–32)
Calcium: 8.5 mg/dL — ABNORMAL LOW (ref 8.9–10.3)
Chloride: 101 mmol/L (ref 98–111)
Creatinine, Ser: 1.96 mg/dL — ABNORMAL HIGH (ref 0.61–1.24)
GFR, Estimated: 41 mL/min — ABNORMAL LOW (ref >60)
Glucose, Bld: 103 mg/dL — ABNORMAL HIGH (ref 70–99)
Potassium: 4.3 mmol/L (ref 3.5–5.1)
Sodium: 136 mmol/L (ref 135–145)

## 2021-07-27 LAB — COOXEMETRY PANEL
Carboxyhemoglobin: 0.6 % (ref 0.5–1.5)
Carboxyhemoglobin: 1.7 % — ABNORMAL HIGH (ref 0.5–1.5)
Methemoglobin: 2.5 % — ABNORMAL HIGH (ref 0.0–1.5)
Methemoglobin: <0.7 % (ref 0.0–1.5)
O2 Saturation: 49.7 %
O2 Saturation: 58.6 %
Total hemoglobin: 13.2 g/dL (ref 12.0–16.0)
Total hemoglobin: 12.4 g/dL (ref 12.0–16.0)

## 2021-07-27 LAB — CBC WITH DIFFERENTIAL/PLATELET
Abs Immature Granulocytes: 0.05 K/uL (ref 0.00–0.07)
Basophils Absolute: 0.1 K/uL (ref 0.0–0.1)
Basophils Relative: 0 %
Eosinophils Absolute: 0.3 K/uL (ref 0.0–0.5)
Eosinophils Relative: 2 %
HCT: 37.8 % — ABNORMAL LOW (ref 39.0–52.0)
Hemoglobin: 13.3 g/dL (ref 13.0–17.0)
Immature Granulocytes: 0 %
Lymphocytes Relative: 12 %
Lymphs Abs: 1.6 K/uL (ref 0.7–4.0)
MCH: 31.6 pg (ref 26.0–34.0)
MCHC: 35.2 g/dL (ref 30.0–36.0)
MCV: 89.8 fL (ref 80.0–100.0)
Monocytes Absolute: 1.5 K/uL — ABNORMAL HIGH (ref 0.1–1.0)
Monocytes Relative: 12 %
Neutro Abs: 9.9 K/uL — ABNORMAL HIGH (ref 1.7–7.7)
Neutrophils Relative %: 74 %
Platelets: 319 K/uL (ref 150–400)
RBC: 4.21 MIL/uL — ABNORMAL LOW (ref 4.22–5.81)
RDW: 16.3 % — ABNORMAL HIGH (ref 11.5–15.5)
WBC: 13.4 K/uL — ABNORMAL HIGH (ref 4.0–10.5)
nRBC: 0 % (ref 0.0–0.2)

## 2021-07-27 LAB — URIC ACID: Uric Acid, Serum: 7.6 mg/dL (ref 3.7–8.6)

## 2021-07-27 LAB — LACTIC ACID, PLASMA: Lactic Acid, Venous: 1.2 mmol/L (ref 0.5–1.9)

## 2021-07-27 LAB — PROCALCITONIN: Procalcitonin: 1.23 ng/mL

## 2021-07-27 MED ORDER — HEPARIN (PORCINE) 25000 UT/250ML-% IV SOLN
1400.0000 [IU]/h | INTRAVENOUS | Status: DC
  Administered 2021-07-27: 1400 [IU]/h via INTRAVENOUS
  Filled 2021-07-27: qty 250

## 2021-07-27 NOTE — TOC CM/SW Note (Signed)
HF TOC CM spoke to pt at bedside. States he lives alone but independent. Uses Medicaid for transportation. Has RW with seat, scale, and CPAP at home. Will continue to follow for dc needs. Jonnie Finner RN3 CCM, Heart Failure TOC CM [phone]  ?

## 2021-07-27 NOTE — Progress Notes (Signed)
ANTICOAGULATION CONSULT NOTE - Initial Consult ? ?Pharmacy Consult for heparin ?Indication:  LV thrombus  (history of LV thrombus and new acute LV thrombus) ? ?Allergies  ?Allergen Reactions  ? Celexa [Citalopram Hydrobromide] Other (See Comments)  ?  Makes the patient disoriented  ? Effexor [Venlafaxine] Other (See Comments)  ?  Altered patient's thinking process(confused)  ? Lisinopril Cough  ? ? ?Patient Measurements: ?Weight: 98.6 kg (217 lb 6 oz) ?Heparin Dosing Weight: 91kg ? ?Vital Signs: ?Temp: 99.1 ?F (37.3 ?C) (04/28 0700) ?Temp Source: Oral (04/28 0400) ?BP: 98/80 (04/28 1400) ?Pulse Rate: 101 (04/28 1400) ? ?Labs: ?Recent Labs  ?  07/26/21 ?1243 07/26/21 ?1310 07/27/21 ?0248  ?HGB 13.8  --  13.3  ?HCT 40.3  --  37.8*  ?PLT 363  --  319  ?CREATININE  --  2.35* 1.96*  ? ? ?Estimated Creatinine Clearance: 51.7 mL/min (A) (by C-G formula based on SCr of 1.96 mg/dL (H)). ? ? ?Medical History: ?Past Medical History:  ?Diagnosis Date  ? AICD (automatic cardioverter/defibrillator) present 07/03/2017  ? Anxiety   ? CHF (congestive heart failure) ([facility])   ? Essential hypertension   ? History of alcohol abuse   ? a. Sober since Feb 2016.  ? 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 Documented n [place]  ? NICM (nonischemic cardiomyopathy) ([facility])   ? a. 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  ? ? ?Assessment: ?51 year old male with history of mural thrombus on chronic apixaban, now with new acutre LV thrombus. New orders to change to IV heparin. CBC currently within normal limits.  ?Last apixaban dose was this morning at 0900. Will start heparin this afternoon.  ? ? ?Goal of Therapy:  ?Heparin level 0.3-0.7 units/ml ?aPTT 66-102 seconds ?Monitor platelets by anticoagulation protocol: Yes ?  ?Plan:  ?Start heparin infusion at 1400  units/hr ?Check anti-Xa level in 6 hours and daily while on heparin ?Continue to monitor H&H and platelets ? ?Sheppard Coil PharmD., BCPS ?Clinical Pharmacist ?07/27/2021 3:07 PM ? ?

## 2021-07-27 NOTE — Plan of Care (Signed)

## 2021-07-27 NOTE — Progress Notes (Addendum)
? ? Advanced Heart Failure Rounding Note ? ?PCP-Cardiologist: Dr. Haroldine Laws  ? ?Subjective:   ? ?4/26: Admitted w/ CS + marked volume overload. Echo severe BiV failure EF 15% + large LV thrombus ? ?On Milrinone 0.25. Co-ox 57% ? ?Good response to IV Lasix, 4.5L in UOP, though wt only down 2 lb. CVP not reading correctly but RN got 12-13 overnight.  ? ?Scr improving, 2.35>>1.96  ? ?Lactic acid 2.9>>pending  ? ?On Eliquis for LV thrombus  ? ?Infectious w/u pending. WBC 15>>13K. PCT 1.46>>1.23. UA negative. BCx pending. He is afebrile. Denies fever/chills  ? ?OOB sitting up in chair. No current dyspnea, but c/w frequent cough w/ pink frothy sputum. Complaining of left foot pain. H/o gout.  ? ? ? ?Objective:   ?Weight Range: ?98.6 kg ?Body mass index is 32.1 kg/m?.  ? ?Vital Signs:   ?Temp:  [98 ?F (36.7 ?C)-98.2 ?F (36.8 ?C)] 98.1 ?F (36.7 ?C) (04/28 0400) ?Pulse Rate:  [91-131] 131 (04/28 0700) ?Resp:  [12-29] 20 (04/28 0700) ?BP: (93-127)/(61-112) 108/75 (04/28 0700) ?SpO2:  [91 %-100 %] 92 % (04/28 0700) ?Weight:  [98.6 kg-99.6 kg] 98.6 kg (04/28 0600) ?Last BM Date :  (PTA) ? ?Weight change: ?Filed Weights  ? 07/26/21 1345 07/27/21 0600  ?Weight: 99.6 kg 98.6 kg  ? ? ?Intake/Output:  ? ?Intake/Output Summary (Last 24 hours) at 07/27/2021 0742 ?Last data filed at 07/27/2021 0600 ?Gross per 24 hour  ?Intake 943.45 ml  ?Output 4525 ml  ?Net -3581.55 ml  ?  ? ? ?Physical Exam  ?  ?General:  fatigued appearing, sitting up in chair. Occasional coughing but No resp difficulty ?HEENT: Normal ?Neck: Supple. JVP not well visualized.  Carotids 2+ bilat; no bruits. No lymphadenopathy or thyromegaly appreciated. ?Cor: PMI nondisplaced. Regular rhythm and tachy rate. 3/6 TR murmur  ?Lungs: decreased BS at the bases bilaterally  ?Abdomen: Soft, nontender, mildly distended. No hepatosplenomegaly. No bruits or masses. Good bowel sounds. ?Extremities: No cyanosis, clubbing, rash, trace b/l LE edema + LUE PICC  ?Neuro: Alert &  orientedx3, cranial nerves grossly intact. moves all 4 extremities w/o difficulty. Affect pleasant ? ? ?Telemetry  ? ?Sinus tach 110s.  ? ?EKG  ?  ?N/A  ? ?Labs  ?  ?CBC ?Recent Labs  ?  07/26/21 ?1243 07/27/21 ?0248  ?WBC 15.1* 13.4*  ?NEUTROABS  --  9.9*  ?HGB 13.8 13.3  ?HCT 40.3 37.8*  ?MCV 91.2 89.8  ?PLT 363 319  ? ?Basic Metabolic Panel ?Recent Labs  ?  07/26/21 ?1310 07/27/21 ?0248  ?NA 137 136  ?K 6.1* 4.3  ?CL 105 101  ?CO2 23 26  ?GLUCOSE 79 103*  ?BUN 54* 43*  ?CREATININE 2.35* 1.96*  ?CALCIUM 8.6* 8.5*  ? ?Liver Function Tests ?Recent Labs  ?  07/26/21 ?1310  ?AST 93*  ?ALT 205*  ?ALKPHOS 96  ?BILITOT 1.4*  ?PROT 6.6  ?ALBUMIN 2.9*  ? ?No results for input(s): LIPASE, AMYLASE in the last 72 hours. ?Cardiac Enzymes ?No results for input(s): CKTOTAL, CKMB, CKMBINDEX, TROPONINI in the last 72 hours. ? ?BNP: ?BNP (last 3 results) ?Recent Labs  ?  04/06/21 ?1121 07/05/21 ?[id number] 07/26/21 ?1243  ?BNP 42.4 3,398.7* 1,528.0*  ? ? ?ProBNP (last 3 results) ?No results for input(s): PROBNP in the last 8760 hours. ? ? ?D-Dimer ?No results for input(s): DDIMER in the last 72 hours. ?Hemoglobin A1C ?No results for input(s): HGBA1C in the last 72 hours. ?Fasting Lipid Panel ?No results for input(s): CHOL, HDL, LDLCALC,  TRIG, CHOLHDL, LDLDIRECT in the last 72 hours. ?Thyroid Function Tests ?No results for input(s): TSH, T4TOTAL, T3FREE, THYROIDAB in the last 72 hours. ? ?Invalid input(s): FREET3 ? ?Other results: ? ? ?Imaging  ? ? ?DG CHEST PORT 1 VIEW ? ?Result Date: 07/26/2021 ?CLINICAL DATA:  Dyspnea EXAM: PORTABLE CHEST 1 VIEW COMPARISON:  07/08/2017 FINDINGS: Cardiac enlargement without heart failure or edema. AICD in the right atrium unchanged. Mild left lower lobe airspace disease and small left effusion. IMPRESSION: Cardiac enlargement without heart failure Mild left lower lobe airspace disease and small left effusion. Probable atelectasis. Electronically Signed   By: Franchot Gallo M.D.   On: 07/26/2021 15:11   ? ?ECHOCARDIOGRAM COMPLETE ? ?Result Date: 07/26/2021 ?   ECHOCARDIOGRAM REPORT   Patient Name:   Robert Booth Date of Exam: 07/26/2021 Medical Rec #:  [id number]     Height:       69.0 in Accession #:    [id number]    Weight:       225.2 lb Date of Birth:  03-Jul-1970    BSA:          2.173 m? Patient Age:    9 years      BP:           102/78 mmHg Patient Gender: M             HR:           103 bpm. Exam Location:  Inpatient Procedure: 2D Echo, Cardiac Doppler and Color Doppler Indications:    CHF-Acute Systolic [id number]  History:        Patient has prior history of Echocardiogram examinations, most                 recent 03/01/2021. CHF, Previous Myocardial Infarction and CAD,                 Defibrillator, Arrythmias:NSVT; Risk Factors:Hypertension.                 Chronic kidney disease. Past history of mural thrombus.  Sonographer:    Darlina Sicilian RDCS Referring Phys: [phone] [facility]  Sonographer Comments: Echo performed no IV available IMPRESSIONS  1. Left ventricular apical thrombus present, 3.5 x 1.5 cm, partially calcified, not seen on echo from 03/01/21 on side by side comparison. Critical results conveyed to Darrick Grinder, NP @ 3:40 pm and acknowledged.  2. Left ventricular ejection fraction, by estimation, is 15%. The left ventricle has severely decreased function. The left ventricle demonstrates global hypokinesis. The left ventricular internal cavity size was severely dilated. Left ventricular diastolic parameters are indeterminate.  3. Right ventricular systolic function is moderately reduced. The right ventricular size is normal. There is moderately elevated pulmonary artery systolic pressure. The estimated right ventricular systolic pressure is [id number] mmHg.  4. Right atrial size was moderately dilated.  5. The mitral valve is grossly normal. Mild mitral valve regurgitation. No evidence of mitral stenosis.  6. Tricuspid valve regurgitation is severe.  7. The aortic valve is grossly normal. Aortic  valve regurgitation is trivial. No aortic stenosis is present.  8. The inferior vena cava is dilated in size with <50% respiratory variability, suggesting right atrial pressure of 15 mmHg. FINDINGS  Left Ventricle: Left ventricular ejection fraction, by estimation, is 15%. The left ventricle has severely decreased function. The left ventricle demonstrates global hypokinesis. The left ventricular internal cavity size was severely dilated. There is no left ventricular hypertrophy. Left ventricular diastolic parameters are indeterminate.  Right Ventricle: The right ventricular size is normal. Right vetricular wall thickness was not well visualized. Right ventricular systolic function is moderately reduced. There is moderately elevated pulmonary artery systolic pressure. The tricuspid regurgitant velocity is 3.04 m/s, and with an assumed right atrial pressure of 15 mmHg, the estimated right ventricular systolic pressure is [id number] mmHg. Left Atrium: Left atrial size was normal in size. Right Atrium: Right atrial size was moderately dilated. Pericardium: There is no evidence of pericardial effusion. Mitral Valve: The mitral valve is grossly normal. Mild mitral valve regurgitation. No evidence of mitral valve stenosis. Tricuspid Valve: The tricuspid valve is grossly normal. Tricuspid valve regurgitation is severe. Aortic Valve: The aortic valve is grossly normal. Aortic valve regurgitation is trivial. No aortic stenosis is present. Pulmonic Valve: The pulmonic valve was grossly normal. Pulmonic valve regurgitation is trivial. Aorta: The aortic root is normal in size and structure. Venous: The inferior vena cava is dilated in size with less than 50% respiratory variability, suggesting right atrial pressure of 15 mmHg. IAS/Shunts: The atrial septum is grossly normal. Additional Comments: A device lead is visualized in the right atrium and right ventricle.  LEFT VENTRICLE PLAX 2D LVIDd:         6.90 cm   Diastology LVIDs:          6.70 cm   LV e' medial:    4.12 cm/s LV PW:         0.70 cm   LV E/e' medial:  11.7 LV IVS:        0.70 cm   LV e' lateral:   7.63 cm/s LVOT diam:     2.20 cm   LV E/e' lateral: 6.3 LV SV:         28 LV SV Ind

## 2021-07-28 DIAGNOSIS — I5023 Acute on chronic systolic (congestive) heart failure: Secondary | ICD-10-CM | POA: Diagnosis not present

## 2021-07-28 LAB — HEPARIN LEVEL (UNFRACTIONATED)
Heparin Unfractionated: >1.1 IU/mL — ABNORMAL HIGH (ref 0.30–0.70)
Heparin Unfractionated: >1.1 IU/mL — ABNORMAL HIGH (ref 0.30–0.70)

## 2021-07-28 LAB — HEMOGLOBIN A1C
Hgb A1c MFr Bld: 4.7 % — ABNORMAL LOW (ref 4.8–5.6)
Mean Plasma Glucose: 88.19 mg/dL

## 2021-07-28 LAB — LIPID PANEL
Cholesterol: 113 mg/dL (ref 0–200)
HDL: 31 mg/dL — ABNORMAL LOW (ref >40)
LDL Cholesterol: 70 mg/dL (ref 0–99)
Total CHOL/HDL Ratio: 3.6 RATIO
Triglycerides: 61 mg/dL (ref <150)
VLDL: 12 mg/dL (ref 0–40)

## 2021-07-28 LAB — BASIC METABOLIC PANEL
Anion gap: 8 (ref 5–15)
BUN: 30 mg/dL — ABNORMAL HIGH (ref 6–20)
CO2: 31 mmol/L (ref 22–32)
Calcium: 8.2 mg/dL — ABNORMAL LOW (ref 8.9–10.3)
Chloride: 97 mmol/L — ABNORMAL LOW (ref 98–111)
Creatinine, Ser: 1.4 mg/dL — ABNORMAL HIGH (ref 0.61–1.24)
GFR, Estimated: >60 mL/min (ref >60)
Glucose, Bld: 114 mg/dL — ABNORMAL HIGH (ref 70–99)
Potassium: 3.5 mmol/L (ref 3.5–5.1)
Sodium: 136 mmol/L (ref 135–145)

## 2021-07-28 LAB — CBC WITH DIFFERENTIAL/PLATELET
Abs Immature Granulocytes: 0.05 10*3/uL (ref 0.00–0.07)
Basophils Absolute: 0 10*3/uL (ref 0.0–0.1)
Basophils Relative: 0 %
Eosinophils Absolute: 0.2 10*3/uL (ref 0.0–0.5)
Eosinophils Relative: 2 %
HCT: 37.3 % — ABNORMAL LOW (ref 39.0–52.0)
Hemoglobin: 13 g/dL (ref 13.0–17.0)
Immature Granulocytes: 0 %
Lymphocytes Relative: 11 %
Lymphs Abs: 1.4 10*3/uL (ref 0.7–4.0)
MCH: 31.3 pg (ref 26.0–34.0)
MCHC: 34.9 g/dL (ref 30.0–36.0)
MCV: 89.7 fL (ref 80.0–100.0)
Monocytes Absolute: 1.6 10*3/uL — ABNORMAL HIGH (ref 0.1–1.0)
Monocytes Relative: 12 %
Neutro Abs: 9.8 10*3/uL — ABNORMAL HIGH (ref 1.7–7.7)
Neutrophils Relative %: 75 %
Platelets: 316 10*3/uL (ref 150–400)
RBC: 4.16 MIL/uL — ABNORMAL LOW (ref 4.22–5.81)
RDW: 16.1 % — ABNORMAL HIGH (ref 11.5–15.5)
WBC: 13.1 10*3/uL — ABNORMAL HIGH (ref 4.0–10.5)
nRBC: 0 % (ref 0.0–0.2)

## 2021-07-28 LAB — COOXEMETRY PANEL
Carboxyhemoglobin: 2.3 % — ABNORMAL HIGH (ref 0.5–1.5)
Methemoglobin: <0.7 % (ref 0.0–1.5)
O2 Saturation: 74.2 %
Total hemoglobin: 13.3 g/dL (ref 12.0–16.0)

## 2021-07-28 LAB — APTT
aPTT: 148 seconds — ABNORMAL HIGH (ref 24–36)
aPTT: >200 seconds (ref 24–36)
aPTT: 50 seconds — ABNORMAL HIGH (ref 24–36)
aPTT: 87 seconds — ABNORMAL HIGH (ref 24–36)

## 2021-07-28 LAB — MAGNESIUM: Magnesium: 1.6 mg/dL — ABNORMAL LOW (ref 1.7–2.4)

## 2021-07-28 LAB — PROCALCITONIN: Procalcitonin: 0.66 ng/mL

## 2021-07-28 MED ORDER — POTASSIUM CHLORIDE CRYS ER 20 MEQ PO TBCR
60.0000 meq | EXTENDED_RELEASE_TABLET | Freq: Once | ORAL | Status: AC
  Administered 2021-07-28: 60 meq via ORAL
  Filled 2021-07-28: qty 3

## 2021-07-28 MED ORDER — SPIRONOLACTONE 12.5 MG HALF TABLET
12.5000 mg | ORAL_TABLET | Freq: Every day | ORAL | Status: DC
  Administered 2021-07-28 – 2021-08-08 (×11): 12.5 mg via ORAL
  Filled 2021-07-28 (×11): qty 1

## 2021-07-28 MED ORDER — DIGOXIN 125 MCG PO TABS
0.1250 mg | ORAL_TABLET | Freq: Every day | ORAL | Status: DC
  Administered 2021-07-28 – 2021-08-02 (×6): 0.125 mg via ORAL
  Filled 2021-07-28 (×6): qty 1

## 2021-07-28 MED ORDER — MAGNESIUM SULFATE 4 GM/100ML IV SOLN
4.0000 g | Freq: Once | INTRAVENOUS | Status: AC
  Administered 2021-07-28: 4 g via INTRAVENOUS
  Filled 2021-07-28: qty 100

## 2021-07-28 MED ORDER — POTASSIUM CHLORIDE CRYS ER 20 MEQ PO TBCR
40.0000 meq | EXTENDED_RELEASE_TABLET | Freq: Two times a day (BID) | ORAL | Status: DC
  Administered 2021-07-28 – 2021-08-07 (×19): 40 meq via ORAL
  Filled 2021-07-28 (×19): qty 2

## 2021-07-28 MED ORDER — FUROSEMIDE 10 MG/ML IJ SOLN
80.0000 mg | INTRAMUSCULAR | Status: AC
Start: 2021-07-28 — End: 2021-07-28
  Administered 2021-07-28: 80 mg via INTRAVENOUS
  Filled 2021-07-28: qty 8

## 2021-07-28 MED ORDER — FUROSEMIDE 10 MG/ML IJ SOLN
80.0000 mg | Freq: Two times a day (BID) | INTRAMUSCULAR | Status: DC
Start: 2021-07-28 — End: 2021-08-07
  Administered 2021-07-28 – 2021-08-07 (×19): 80 mg via INTRAVENOUS
  Filled 2021-07-28 (×19): qty 8

## 2021-07-28 MED ORDER — HEPARIN (PORCINE) 25000 UT/250ML-% IV SOLN
1400.0000 [IU]/h | INTRAVENOUS | Status: DC
  Administered 2021-07-28: 1200 [IU]/h via INTRAVENOUS
  Administered 2021-07-29 – 2021-07-30 (×2): 1400 [IU]/h via INTRAVENOUS
  Filled 2021-07-28 (×4): qty 250

## 2021-07-28 NOTE — Progress Notes (Addendum)
? ? Advanced Heart Failure Rounding Note ? ?PCP-Cardiologist: Dr. Haroldine Laws  ? ?Subjective:   ? ?4/26: Admitted w/ CS + marked volume overload. Echo severe BiV failure EF 15% + large LV thrombus ? ?On Milrinone 0.25. Co-ox 74% ? ?Has diuresed well. Weight down 8 pounds in 2 day. CVP still 10-11 ? ?+ cough and orthopnea. Asking for more lasix. No CP> ? ?Scr 2.4 ->-> 1.4  K 3.5 ? ? ? ?Objective:   ?Weight Range: ?95.9 kg ?Body mass index is 31.22 kg/m?.  ? ?Vital Signs:   ?Temp:  [97.6 ?F (36.4 ?C)-98.6 ?F (37 ?C)] 98.5 ?F (36.9 ?C) (04/29 0730) ?Pulse Rate:  [93-127] 107 (04/29 0800) ?Resp:  [11-23] 11 (04/29 0800) ?BP: (96-121)/(65-98) 98/65 (04/29 0800) ?SpO2:  [86 %-97 %] 91 % (04/29 0800) ?Weight:  [95.9 kg-98.6 kg] 95.9 kg (04/29 0500) ?Last BM Date : 07/27/21 ? ?Weight change: ?Filed Weights  ? 07/27/21 0600 07/27/21 1500 07/28/21 0500  ?Weight: 98.6 kg 98.6 kg 95.9 kg  ? ? ?Intake/Output:  ? ?Intake/Output Summary (Last 24 hours) at 07/28/2021 1108 ?Last data filed at 07/28/2021 0800 ?Gross per 24 hour  ?Intake 729.15 ml  ?Output 3025 ml  ?Net -2295.85 ml  ? ? ? ?Physical Exam  ?  ?General: Sitting in chair. No resp difficulty ?HEENT: normal ?Neck: supple. JVP to jaw  Carotids 2+ bilat; no bruits. No lymphadenopathy or thryomegaly appreciated. ?Cor: PMI nondisplaced. Regular tachy +s3 ?Lungs: minimla crackles ?Abdomen: soft, nontender, nondistended. No hepatosplenomegaly. No bruits or masses. Good bowel sounds. ?Extremities: no cyanosis, clubbing, rash, tr edema ?Neuro: alert & orientedx3, cranial nerves grossly intact. moves all 4 extremities w/o difficulty. Affect pleasant ? ? ?Telemetry  ? ?Sinus tach 100-110 Personally reviewed ? ?Labs  ?  ?CBC ?Recent Labs  ?  07/27/21 ?0248 07/28/21 ?0209  ?WBC 13.4* 13.1*  ?NEUTROABS 9.9* 9.8*  ?HGB 13.3 13.0  ?HCT 37.8* 37.3*  ?MCV 89.8 89.7  ?PLT 319 316  ? ? ?Basic Metabolic Panel ?Recent Labs  ?  07/27/21 ?0248 07/28/21 ?0209  ?NA 136 136  ?K 4.3 3.5  ?CL 101 97*   ?CO2 26 31  ?GLUCOSE 103* 114*  ?BUN 43* 30*  ?CREATININE 1.96* 1.40*  ?CALCIUM 8.5* 8.2*  ?MG  --  1.6*  ? ? ?Liver Function Tests ?Recent Labs  ?  07/26/21 ?1310  ?AST 93*  ?ALT 205*  ?ALKPHOS 96  ?BILITOT 1.4*  ?PROT 6.6  ?ALBUMIN 2.9*  ? ? ?No results for input(s): LIPASE, AMYLASE in the last 72 hours. ?Cardiac Enzymes ?No results for input(s): CKTOTAL, CKMB, CKMBINDEX, TROPONINI in the last 72 hours. ? ?BNP: ?BNP (last 3 results) ?Recent Labs  ?  04/06/21 ?1121 07/05/21 ?[id number] 07/26/21 ?1243  ?BNP 42.4 3,398.7* 1,528.0*  ? ? ? ?ProBNP (last 3 results) ?No results for input(s): PROBNP in the last 8760 hours. ? ? ?D-Dimer ?No results for input(s): DDIMER in the last 72 hours. ?Hemoglobin A1C ?Recent Labs  ?  07/28/21 ?0209  ?HGBA1C 4.7*  ? ?Fasting Lipid Panel ?Recent Labs  ?  07/28/21 ?0209  ?CHOL 113  ?HDL 31*  ?[place] 70  ?TRIG 61  ?CHOLHDL 3.6  ? ?Thyroid Function Tests ?No results for input(s): TSH, T4TOTAL, T3FREE, THYROIDAB in the last 72 hours. ? ?Invalid input(s): FREET3 ? ?Other results: ? ? ?Imaging  ? ? ?No results found. ? ? ?Medications:   ? ? ?Scheduled Medications: ? atorvastatin  20 mg Oral Daily  ? Chlorhexidine Gluconate Cloth  6  each Topical Daily  ? gabapentin  600 mg Oral BID  ? mexiletine  300 mg Oral BID  ? pantoprazole  40 mg Oral Daily  ? sertraline  200 mg Oral Daily  ? sodium chloride flush  10-40 mL Intracatheter Q12H  ? sodium chloride flush  3 mL Intravenous Q12H  ? ? ?Infusions: ? sodium chloride 10 mL/hr at 07/28/21 0800  ? heparin 1,200 Units/hr (07/28/21 0800)  ? milrinone 0.25 mcg/kg/min (07/28/21 0800)  ? ? ?PRN Medications: ?sodium chloride, acetaminophen, guaiFENesin-dextromethorphan, ondansetron (ZOFRAN) IV, sodium chloride flush, sodium chloride flush ? ? ? ?Patient Profile  ? ?51 y/o male with severe systolic HF due to NICM. EF 15-20%. Presented to clinic with low output symptoms and marked fluid overload. Not responding to oral diuretics.  ?  ?ICD interrogated. No VT  but HL score 77.  ?  ?Admitted to ICU. Lactate 2.9. Echo w/ large LV thrombus, EF 15%, RV mod reduced, severe TR   ? ?Assessment/Plan  ? ?1. Acute on chronic systolic HF >>Cardiogenic Shock  ?- NICM. [employer] ICD ?- Cath ok performed [place] 2016. 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  ?- Echo 10/2016 EF 10-15%. LV dysfunction after stopping HF meds. ?- Echo 03/01/21 EF 20-25% RV moderately reduced ?- cMRI 2/19 EF 19%. RV mildly decreased .  ?- CPX 11/20 with mild functional limitation; no clear HF limitation despite EF<20% ?- now admitted with cardiogenic shock and marked fluid overload. Initial lactate 2.9. Started on Milrinone ?- Echo 4/23 EF 15%, + LV thrombus, RV mod reduced, severe TR  ?- on Milrinone 0.25. Co-ox 74% ?- CVP 10-11. Continue to diuresis w/ IV Lasix 80 mg bid ?- Off GDMT due to AKI/Hyperkalemia/shock.  ?- Add digoxin and spiro today ?- Will need repeat R/L cath prior to d/c. Plan for Monday ?- May need to consider advanced therapies. Not sure he is a candidate for VAD. Has been more adherent with medical therapy recently ?  ?2. LV mural thrombus  ?- He has been off warfarin ?- No thrombus on echo 12/22.  ?- Echo this admit w/ LV thrombus.  ?- Off Eliquis -> heparin for cth Monday ?  ?3. H/o ETOH abuse ?- previously heavy drinker . No ETOH since 2021.  ?  ?4. VT 07/08/17  ?- He is s/p [employer] ICD 07/03/17. ?- Multiple episodes sustained VT on device check 06/621. Mexiletine increased to 300 mg BID. Just one episode NSVT since last check.  ?- Continue mexiletine at current dose. ?- Keep K > 4.0 and Mg > 2.0  ?  ?5. Gout ?- On allopurinol 300 mg daily.  ?- ? If acute gout flare contributing to foot pain ?- uric acid 7.66 ?  ?6. 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 ?- Continues to struggle with balance/mobility.  ?  ?7. Anxiety/Depression ?- Follows with [facility] [place]. ?  ?8. OSA ?- Struggling with adjusting to CPAP ?  ?9. Hemoptysis ?- Hgb stable 13.3 ?- likely  2/2 pulmonary edema, c/w diuresis  ?- He had similar presentation years ago in the setting of PNA.  ?- CXR c/w atelectasis  ?  ?10. ID  ?- WBC 15.1>>13.4K. AF ?- CXR w/ probable atelectasis. ? PNA w/ elevated PCT 1.46>>1.23 ?- UA negative ?- Blood CxNGTD ?  ?11. AKI on CKD:  ?-Creatinine baseline previously 1.2-1.4  ?-Creatinine 2.34>>> 2.4 ?-Likely Cardiorenal  ?-Continue to support output w/ milrinone ?-follow BMP w/ diuresis  ?  ?12. Hyperkalemia  ?-  K 6.1 on admit resolved w/ Lokelma ? ?Can got to [facility] ? ?Length of Stay: 2 ? ?Glori Bickers, MD  ?07/28/2021, 11:08 AM ? ?Advanced Heart Failure Team ?Pager [phone] (M-F; 7a - 5p)  ?Please contact [facility] for night-coverage after hours (5p -7a ) and weekends on [email or web address] ? ? ? ?

## 2021-07-28 NOTE — Progress Notes (Signed)
ANTICOAGULATION CONSULT NOTE  ? ?Pharmacy Consult for heparin ?Indication:  LV thrombus  (history of LV thrombus and new acute LV thrombus) ? ?Allergies  ?Allergen Reactions  ? Celexa [Citalopram Hydrobromide] Other (See Comments)  ?  Makes the patient disoriented  ? Effexor [Venlafaxine] Other (See Comments)  ?  Altered patient's thinking process(confused)  ? Lisinopril Cough  ? ? ?Patient Measurements: ?Height: 5\' 9"  (175.3 cm) ?Weight: 95.9 kg (211 lb 6.4 oz) ?IBW/kg (Calculated) : 70.7 ?Heparin Dosing Weight: 91kg ? ?Vital Signs: ?Temp: 98 ?F (36.7 ?C) (04/29 1941) ?Temp Source: Oral (04/29 1941) ?BP: 122/87 (04/29 1941) ?Pulse Rate: 108 (04/29 1941) ? ?Labs: ?Recent Labs  ?  07/26/21 ?1243 07/26/21 ?1310 07/27/21 ?0248 07/28/21 ?0209 07/28/21 ?0209 07/28/21 ?1124 07/28/21 ?1200 07/28/21 ?1544 07/28/21 ?2304  ?HGB 13.8  --  13.3 13.0  --   --   --   --   --   ?HCT 40.3  --  37.8* 37.3*  --   --   --   --   --   ?PLT 363  --  319 316  --   --   --   --   --   ?APTT  --   --   --  148*   < > >200*  --  50* 87*  ?HEPARINUNFRC  --   --   --  >1.10*  --   --  >1.10*  --   --   ?CREATININE  --  2.35* 1.96* 1.40*  --   --   --   --   --   ? < > = values in this interval not displayed.  ? ? ? ?Estimated Creatinine Clearance: 71.3 mL/min (A) (by C-G formula based on SCr of 1.4 mg/dL (H)). ? ? ?Medical History: ?Past Medical History:  ?Diagnosis Date  ? AICD (automatic cardioverter/defibrillator) present 07/03/2017  ? Anxiety   ? CHF (congestive heart failure) ([place])   ? Essential hypertension   ? History of alcohol abuse   ? a. Sober since Feb 2016.  ? 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 Documented n [place]  ? NICM (nonischemic cardiomyopathy) ([place])   ? a. 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  ? ? ?Assessment: 51 year old  male with history of mural thrombus on chronic apixaban, now with new acute LV thrombus. New orders to change to IV heparin. CBC currently within normal limits.  ?Last apixaban dose was this morning at 0900. Will start heparin this afternoon.  ? ?aPTT therapeutic: 87 seconds, no infusion issues or overt bleeding reported. ? ?Goal of Therapy:  ?Heparin level 0.3-0.7 units/ml ?aPTT 66-102 seconds ?Monitor platelets by anticoagulation protocol: Yes ?  ?Plan:  ?Continue IV heparin at 1400 units/hr ?Recheck aPTT in 6 hrs ?Daily aPTT, heparin level and CBC. ? ?Georga Bora, PharmD ?Clinical Pharmacist ?07/28/2021 11:39 PM ?Please check AMION for all [place] numbers ? ? ? ? ?

## 2021-07-28 NOTE — Progress Notes (Signed)
ANTICOAGULATION CONSULT NOTE  ? ?Pharmacy Consult for heparin ?Indication:  LV thrombus  (history of LV thrombus and new acute LV thrombus) ? ?Allergies  ?Allergen Reactions  ? Celexa [Citalopram Hydrobromide] Other (See Comments)  ?  Makes the patient disoriented  ? Effexor [Venlafaxine] Other (See Comments)  ?  Altered patient's thinking process(confused)  ? Lisinopril Cough  ? ? ?Patient Measurements: ?Height: 5\' 9"  (175.3 cm) ?Weight: 95.9 kg (211 lb 6.4 oz) ?IBW/kg (Calculated) : 70.7 ?Heparin Dosing Weight: 91kg ? ?Vital Signs: ?Temp: 97.9 ?F (36.6 ?C) (04/29 1726) ?Temp Source: Oral (04/29 1726) ?BP: 118/88 (04/29 1700) ?Pulse Rate: 107 (04/29 1700) ? ?Labs: ?Recent Labs  ?  07/26/21 ?1243 07/26/21 ?1310 07/27/21 ?0248 07/28/21 ?0209 07/28/21 ?1124 07/28/21 ?1200 07/28/21 ?1544  ?HGB 13.8  --  13.3 13.0  --   --   --   ?HCT 40.3  --  37.8* 37.3*  --   --   --   ?PLT 363  --  319 316  --   --   --   ?APTT  --   --   --  148* >200*  --  50*  ?HEPARINUNFRC  --   --   --  >1.10*  --  >1.10*  --   ?CREATININE  --  2.35* 1.96* 1.40*  --   --   --   ? ? ? ?Estimated Creatinine Clearance: 71.3 mL/min (A) (by C-G formula based on SCr of 1.4 mg/dL (H)). ? ? ?Medical History: ?Past Medical History:  ?Diagnosis Date  ? AICD (automatic cardioverter/defibrillator) present 07/03/2017  ? Anxiety   ? CHF (congestive heart failure) ([facility])   ? Essential hypertension   ? History of alcohol abuse   ? a. Sober since Feb 2016.  ? 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 Documented n 07/2014  ? NICM (nonischemic cardiomyopathy) ([facility])   ? a. 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  ? ? ?Assessment: 51 year old male with history of mural thrombus on chronic apixaban, now with new acute LV thrombus. New orders to change to IV heparin. CBC currently  within normal limits.  ?Last apixaban dose was this morning at 0900. Will start heparin this afternoon.  ? ?Heparin level >1.10 and aptt > 200 initially, but was drawn from line where heparin was running.  Redrawn peripherally, aPTT = 50 ? ?Goal of Therapy:  ?Heparin level 0.3-0.7 units/ml ?aPTT 66-102 seconds ?Monitor platelets by anticoagulation protocol: Yes ?  ?Plan:  ?Increase IV heparin to 1400 units/hr ?Recheck aPTT in 6 hrs ?Daily aPTT, heparin level and CBC. ? ?44, Pharm D, BCPS, BCCP ?Clinical Pharmacist ? 07/28/2021 5:37 PM  ? ?[facility] pharmacy phone numbers are listed on [email or web address] ? ? ? ?

## 2021-07-28 NOTE — Progress Notes (Signed)
ANTICOAGULATION CONSULT NOTE  ? ?Pharmacy Consult for heparin ?Indication:  LV thrombus  (history of LV thrombus and new acute LV thrombus) ? ?Allergies  ?Allergen Reactions  ? Celexa [Citalopram Hydrobromide] Other (See Comments)  ?  Makes the patient disoriented  ? Effexor [Venlafaxine] Other (See Comments)  ?  Altered patient's thinking process(confused)  ? Lisinopril Cough  ? ? ?Patient Measurements: ?Height: 5\' 9"  (175.3 cm) ?Weight: 98.6 kg (217 lb 6 oz) ?IBW/kg (Calculated) : 70.7 ?Heparin Dosing Weight: 91kg ? ?Vital Signs: ?BP: 113/87 (04/28 2100) ?Pulse Rate: 105 (04/28 2100) ? ?Labs: ?Recent Labs  ?  07/26/21 ?1243 07/26/21 ?1310 07/27/21 ?0248 07/28/21 ?0209  ?HGB 13.8  --  13.3 13.0  ?HCT 40.3  --  37.8* 37.3*  ?PLT 363  --  319 316  ?APTT  --   --   --  148*  ?HEPARINUNFRC  --   --   --  >1.10*  ?CREATININE  --  2.35* 1.96* 1.40*  ? ? ? ?Estimated Creatinine Clearance: 72.3 mL/min (A) (by C-G formula based on SCr of 1.4 mg/dL (H)). ? ? ?Medical History: ?Past Medical History:  ?Diagnosis Date  ? AICD (automatic cardioverter/defibrillator) present 07/03/2017  ? Anxiety   ? CHF (congestive heart failure) ([place])   ? Essential hypertension   ? History of alcohol abuse   ? a. Sober since Feb 2016.  ? 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 Documented n [place]  ? NICM (nonischemic cardiomyopathy) ([place])   ? a. 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  ? ? ?Assessment: 51 year old male with history of mural thrombus on chronic apixaban, now with new acute LV thrombus. New orders to change to IV heparin. CBC currently within normal limits.  ?Last apixaban dose was this morning at 0900. Will start heparin this afternoon.  ? ?Heparin level >1.10 and aptt 148 sec levels; confirmed labs drawn from central line, but infusion  paused and line flushed prior to collection; no overt bleeding per RN, CBC stable WNL ? ?Goal of Therapy:  ?Heparin level 0.3-0.7 units/ml ?aPTT 66-102 seconds ?Monitor platelets by anticoagulation protocol: Yes ?  ?Plan:  ?Hold heparin infusion x1 hour ?Restart heparin infusion at 0420 @ rate 1200 units/hr ?Check anti-Xa level in 6 hours and daily while on heparin ?Continue to monitor H&H and platelets ? ?Georga Bora, PharmD ?Clinical Pharmacist ?07/28/2021 3:25 AM ?Please check AMION for all [place] numbers ? ? ?

## 2021-07-29 DIAGNOSIS — I5023 Acute on chronic systolic (congestive) heart failure: Secondary | ICD-10-CM | POA: Diagnosis not present

## 2021-07-29 LAB — CBC WITH DIFFERENTIAL/PLATELET
Abs Immature Granulocytes: 0.07 10*3/uL (ref 0.00–0.07)
Basophils Absolute: 0 10*3/uL (ref 0.0–0.1)
Basophils Relative: 0 %
Eosinophils Absolute: 0.2 10*3/uL (ref 0.0–0.5)
Eosinophils Relative: 2 %
HCT: 38.3 % — ABNORMAL LOW (ref 39.0–52.0)
Hemoglobin: 13.8 g/dL (ref 13.0–17.0)
Immature Granulocytes: 1 %
Lymphocytes Relative: 14 %
Lymphs Abs: 1.7 10*3/uL (ref 0.7–4.0)
MCH: 31.9 pg (ref 26.0–34.0)
MCHC: 36 g/dL (ref 30.0–36.0)
MCV: 88.7 fL (ref 80.0–100.0)
Monocytes Absolute: 1.4 10*3/uL — ABNORMAL HIGH (ref 0.1–1.0)
Monocytes Relative: 11 %
Neutro Abs: 9.1 10*3/uL — ABNORMAL HIGH (ref 1.7–7.7)
Neutrophils Relative %: 72 %
Platelets: 376 10*3/uL (ref 150–400)
RBC: 4.32 MIL/uL (ref 4.22–5.81)
RDW: 16 % — ABNORMAL HIGH (ref 11.5–15.5)
WBC: 12.5 10*3/uL — ABNORMAL HIGH (ref 4.0–10.5)
nRBC: 0 % (ref 0.0–0.2)

## 2021-07-29 LAB — BASIC METABOLIC PANEL
Anion gap: 9 (ref 5–15)
BUN: 25 mg/dL — ABNORMAL HIGH (ref 6–20)
CO2: 31 mmol/L (ref 22–32)
Calcium: 8.6 mg/dL — ABNORMAL LOW (ref 8.9–10.3)
Chloride: 96 mmol/L — ABNORMAL LOW (ref 98–111)
Creatinine, Ser: 1.41 mg/dL — ABNORMAL HIGH (ref 0.61–1.24)
GFR, Estimated: >60 mL/min (ref >60)
Glucose, Bld: 93 mg/dL (ref 70–99)
Potassium: 4.3 mmol/L (ref 3.5–5.1)
Sodium: 136 mmol/L (ref 135–145)

## 2021-07-29 LAB — APTT: aPTT: 83 seconds — ABNORMAL HIGH (ref 24–36)

## 2021-07-29 LAB — COOXEMETRY PANEL
Carboxyhemoglobin: 1.8 % — ABNORMAL HIGH (ref 0.5–1.5)
Methemoglobin: <0.7 % (ref 0.0–1.5)
O2 Saturation: 58.5 %
Total hemoglobin: 13.6 g/dL (ref 12.0–16.0)

## 2021-07-29 LAB — HEPARIN LEVEL (UNFRACTIONATED): Heparin Unfractionated: 0.89 IU/mL — ABNORMAL HIGH (ref 0.30–0.70)

## 2021-07-29 LAB — MAGNESIUM: Magnesium: 2 mg/dL (ref 1.7–2.4)

## 2021-07-29 MED ORDER — SODIUM CHLORIDE 0.9 % IV SOLN: INTRAVENOUS | Status: DC

## 2021-07-29 MED ORDER — SODIUM CHLORIDE 0.9% FLUSH
3.0000 mL | Freq: Two times a day (BID) | INTRAVENOUS | Status: DC
  Administered 2021-07-29: 3 mL via INTRAVENOUS

## 2021-07-29 MED ORDER — ASPIRIN 81 MG PO CHEW
81.0000 mg | CHEWABLE_TABLET | ORAL | Status: AC
  Administered 2021-07-30: 81 mg via ORAL
  Filled 2021-07-29: qty 1

## 2021-07-29 MED ORDER — SODIUM CHLORIDE 0.9 % IV SOLN: 250.0000 mL | INTRAVENOUS | Status: DC | PRN

## 2021-07-29 MED ORDER — SODIUM CHLORIDE 0.9% FLUSH: 3.0000 mL | INTRAVENOUS | Status: DC | PRN

## 2021-07-29 NOTE — Plan of Care (Signed)
?  Problem: Clinical Measurements: ?Goal: Ability to maintain clinical measurements within normal limits will improve ?Outcome: Progressing ?Goal: Will remain free from infection ?Outcome: Progressing ?Goal: Diagnostic test results will improve ?Outcome: Progressing ?  ?

## 2021-07-29 NOTE — Plan of Care (Signed)
°  Problem: Education: °Goal: Knowledge of General Education information will improve °Description: Including pain rating scale, medication(s)/side effects and non-pharmacologic comfort measures °Outcome: Progressing °  °Problem: Health Behavior/Discharge Planning: °Goal: Ability to manage health-related needs will improve °Outcome: Progressing °  °Problem: Clinical Measurements: °Goal: Cardiovascular complication will be avoided °Outcome: Progressing °  °

## 2021-07-29 NOTE — Progress Notes (Signed)
ANTICOAGULATION CONSULT NOTE  ? ?Pharmacy Consult for heparin ?Indication:  LV thrombus  (history of LV thrombus and new acute LV thrombus) ? ?Allergies  ?Allergen Reactions  ? Celexa [Citalopram Hydrobromide] Other (See Comments)  ?  Makes the patient disoriented  ? Effexor [Venlafaxine] Other (See Comments)  ?  Altered patient's thinking process(confused)  ? Lisinopril Cough  ? ? ?Patient Measurements: ?Height: 5\' 9"  (175.3 cm) ?Weight: 94.8 kg (209 lb) ?IBW/kg (Calculated) : 70.7 ?Heparin Dosing Weight: 91kg ? ?Vital Signs: ?Temp: 97.9 ?F (36.6 ?C) (04/30 1155) ?Temp Source: Oral (04/30 1155) ?BP: 117/94 (04/30 1300) ?Pulse Rate: 128 (04/30 1155) ? ?Labs: ?Recent Labs  ?  07/27/21 ?0248 07/28/21 ?0209 07/28/21 ?1124 07/28/21 ?1200 07/28/21 ?1544 07/28/21 ?2304 07/29/21 ?0336  ?HGB 13.3 13.0  --   --   --   --  13.8  ?HCT 37.8* 37.3*  --   --   --   --  38.3*  ?PLT 319 316  --   --   --   --  376  ?APTT  --  148*   < >  --  50* 87* 83*  ?HEPARINUNFRC  --  >1.10*  --  >1.10*  --   --  0.89*  ?CREATININE 1.96* 1.40*  --   --   --   --  1.41*  ? < > = values in this interval not displayed.  ? ? ? ?Estimated Creatinine Clearance: 70.4 mL/min (A) (by C-G formula based on SCr of 1.41 mg/dL (H)). ? ? ?Medical History: ?Past Medical History:  ?Diagnosis Date  ? AICD (automatic cardioverter/defibrillator) present 07/03/2017  ? Anxiety   ? CHF (congestive heart failure) ([place])   ? Essential hypertension   ? History of alcohol abuse   ? a. Sober since Feb 2016.  ? 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 Documented n [place]  ? NICM (nonischemic cardiomyopathy) ([place])   ? a. 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  ? ? ?Assessment: 51 year old male with history of mural thrombus on chronic apixaban, now with new acute LV thrombus. New  orders to change to IV heparin. CBC currently within normal limits.  ?Last apixaban dose was am 4/28 > heparin  started in afternoon for planned cath 5/1 ? ?Heparin level 1 elevated from apixaban interference.  Heparin running in PICC drip rate 1400 uts/hr aptt drawn peripherally 83sec at goal.  CBC stable no bleeding ntoed ? ?Goal of Therapy:  ?Heparin level 0.3-0.7 units/ml ?aPTT 66-102 seconds ?Monitor platelets by anticoagulation protocol: Yes ?  ?Plan:  ?Continue IV heparin  1400 units/hr ?Daily aPTT, heparin level and CBC. ?Follow up cath for oral anticoagulation  ? ? ? ?Bonnita Nasuti Pharm.D. CPP, BCPS ?Clinical Pharmacist ?[phone] ?07/29/2021 1:51 PM  ? ?[facility] pharmacy phone numbers are listed on [email or web address] ? ? ? ?

## 2021-07-29 NOTE — Progress Notes (Signed)
? ? Advanced Heart Failure Rounding Note ? ?PCP-Cardiologist: Dr. Haroldine Laws  ? ?Subjective:   ? ?4/26: Admitted w/ CS + marked volume overload. Echo severe BiV failure EF 15% + large LV thrombus ? ?On Milrinone 0.25. Co-ox 59% ? ?Remains on IV lasix. Out 4.1L. Weight down another 2 pounds (10 pounds total). CVP 9-10 ? ?Feels good. Denies Cp, SOB, orthopnea or PND ? ?Scr 2.4 ->-> 1.4 -> 1.4  K 4.3 ? ?Objective:   ?Weight Range: ?94.8 kg ?Body mass index is 30.86 kg/m?.  ? ?Vital Signs:   ?Temp:  [97.9 ?F (36.6 ?C)-98.6 ?F (37 ?C)] 97.9 ?F (36.6 ?C) (04/30 1155) ?Pulse Rate:  [107-116] 109 (04/30 0746) ?Resp:  [10-19] 17 (04/30 0746) ?BP: (114-129)/(87-102) 118/94 (04/30 0746) ?SpO2:  [91 %-96 %] 96 % (04/30 1155) ?Weight:  [94.8 kg] 94.8 kg (04/30 0400) ?Last BM Date : 07/28/21 ? ?Weight change: ?Filed Weights  ? 07/27/21 1500 07/28/21 0500 07/29/21 0400  ?Weight: 98.6 kg 95.9 kg 94.8 kg  ? ? ?Intake/Output:  ? ?Intake/Output Summary (Last 24 hours) at 07/29/2021 1157 ?Last data filed at 07/29/2021 0730 ?Gross per 24 hour  ?Intake 549.24 ml  ?Output 4100 ml  ?Net -3550.76 ml  ? ? ? ?Physical Exam  ?  ?General:  Sitting in bed  No resp difficulty ?HEENT: normal ?Neck: supple. JVP to jaw Carotids 2+ bilat; no bruits. No lymphadenopathy or thryomegaly appreciated. ?Cor: PMI nondisplaced. Tachy regular No rubs, gallops or murmurs. ?Lungs: clear ?Abdomen: soft, nontender, nondistended. No hepatosplenomegaly. No bruits or masses. Good bowel sounds. ?Extremities: no cyanosis, clubbing, rash, edema ?Neuro: alert & orientedx3, cranial nerves grossly intact. moves all 4 extremities w/o difficulty. Affect pleasant ? ? ? ?Telemetry  ? ?Sinus tach 100-110 Personally reviewed ? ?Labs  ?  ?CBC ?Recent Labs  ?  07/28/21 ?0209 07/29/21 ?0336  ?WBC 13.1* 12.5*  ?NEUTROABS 9.8* 9.1*  ?HGB 13.0 13.8  ?HCT 37.3* 38.3*  ?MCV 89.7 88.7  ?PLT 316 376  ? ? ?Basic Metabolic Panel ?Recent Labs  ?  07/28/21 ?0209 07/29/21 ?0336  ?NA 136 136  ?K  3.5 4.3  ?CL 97* 96*  ?CO2 31 31  ?GLUCOSE 114* 93  ?BUN 30* 25*  ?CREATININE 1.40* 1.41*  ?CALCIUM 8.2* 8.6*  ?MG 1.6* 2.0  ? ? ?Liver Function Tests ?Recent Labs  ?  07/26/21 ?1310  ?AST 93*  ?ALT 205*  ?ALKPHOS 96  ?BILITOT 1.4*  ?PROT 6.6  ?ALBUMIN 2.9*  ? ? ?No results for input(s): LIPASE, AMYLASE in the last 72 hours. ?Cardiac Enzymes ?No results for input(s): CKTOTAL, CKMB, CKMBINDEX, TROPONINI in the last 72 hours. ? ?BNP: ?BNP (last 3 results) ?Recent Labs  ?  04/06/21 ?1121 07/05/21 ?[id number] 07/26/21 ?1243  ?BNP 42.4 3,398.7* 1,528.0*  ? ? ? ?ProBNP (last 3 results) ?No results for input(s): PROBNP in the last 8760 hours. ? ? ?D-Dimer ?No results for input(s): DDIMER in the last 72 hours. ?Hemoglobin A1C ?Recent Labs  ?  07/28/21 ?0209  ?HGBA1C 4.7*  ? ? ?Fasting Lipid Panel ?Recent Labs  ?  07/28/21 ?0209  ?CHOL 113  ?HDL 31*  ?[place] 70  ?TRIG 61  ?CHOLHDL 3.6  ? ? ?Thyroid Function Tests ?No results for input(s): TSH, T4TOTAL, T3FREE, THYROIDAB in the last 72 hours. ? ?Invalid input(s): FREET3 ? ?Other results: ? ? ?Imaging  ? ? ?No results found. ? ? ?Medications:   ? ? ?Scheduled Medications: ? atorvastatin  20 mg Oral Daily  ? Chlorhexidine  Gluconate Cloth  6 each Topical Daily  ? digoxin  0.125 mg Oral Daily  ? furosemide  80 mg Intravenous BID  ? gabapentin  600 mg Oral BID  ? mexiletine  300 mg Oral BID  ? pantoprazole  40 mg Oral Daily  ? potassium chloride  40 mEq Oral BID  ? sertraline  200 mg Oral Daily  ? sodium chloride flush  10-40 mL Intracatheter Q12H  ? sodium chloride flush  3 mL Intravenous Q12H  ? spironolactone  12.5 mg Oral Daily  ? ? ?Infusions: ? sodium chloride 10 mL/hr at 07/28/21 1100  ? heparin 1,400 Units/hr (07/29/21 0933)  ? milrinone 0.25 mcg/kg/min (07/29/21 0934)  ? ? ?PRN Medications: ?sodium chloride, acetaminophen, guaiFENesin-dextromethorphan, ondansetron (ZOFRAN) IV, sodium chloride flush, sodium chloride flush ? ? ? ?Patient Profile  ? ?51 y/o male with severe  systolic HF due to NICM. EF 15-20%. Presented to clinic with low output symptoms and marked fluid overload. Not responding to oral diuretics.  ?  ?ICD interrogated. No VT but HL score 77.  ?  ?Admitted to ICU. Lactate 2.9. Echo w/ large LV thrombus, EF 15%, RV mod reduced, severe TR   ? ?Assessment/Plan  ? ?1. Acute on chronic systolic HF >>Cardiogenic Shock  ?- NICM. [employer] ICD ?- Cath ok performed [place] 2016. 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  ?- Echo 10/2016 EF 10-15%. LV dysfunction after stopping HF meds. ?- Echo 03/01/21 EF 20-25% RV moderately reduced ?- cMRI 2/19 EF 19%. RV mildly decreased .  ?- CPX 11/20 with mild functional limitation; no clear HF limitation despite EF<20% ?- now admitted with cardiogenic shock and marked fluid overload. Initial lactate 2.9. Started on Milrinone ?- Echo 4/23 EF 15%, + LV thrombus, RV mod reduced, severe TR  ?- on Milrinone 0.25. Co-ox 59% Wil drop to 0.125 ?- Diuresing well. Weight down 10 pounds ?- CVP 9-10. Continue to diuresis w/ IV Lasix 80 mg bid ?- Off GDMT due to AKI/Hyperkalemia/shock.  ?- Continue digoxin and spiro today ?-  R/L cath tomorrow  ?- May need to consider advanced therapies. Not sure he is a candidate for VAD. Has been more adherent with medical therapy recently ?  ?2. LV mural thrombus  ?- He has been off warfarin ?- No thrombus on echo 12/22.  ?- Echo this admit w/ LV thrombus.  ?- Off Eliquis -> heparin for cath ?- No obvious bleeding ?  ?3. H/o ETOH abuse ?- previously heavy drinker . No ETOH since 2021.  ?  ?4. VT 07/08/17  ?- He is s/p [employer] Sci ICD 07/03/17. ?- Multiple episodes sustained VT on device check 06/621. Mexiletine increased to 300 mg BID. Just one episode NSVT since last check.  ?- Continue mexiletine at current dose. ?- Keep K > 4.0 and Mg > 2.0  ?  ?5. Gout ?- On allopurinol 300 mg daily.  ?- ? If acute gout flare contributing to foot pain ?- uric acid 7.6 ?  ?6. 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 ?-  Continues to struggle with balance/mobility.  ?  ?7. Anxiety/Depression ?- Follows with [facility] [place]. ?  ?8. OSA ?- Struggling with adjusting to CPAP ?  ?9. Hemoptysis ?- Hgb stable 13.8 ?- likely 2/2 pulmonary edema -> resolved with diuresis ?- CXR ok ?  ?10. ID  ?- WBC 15.1>>13.4K. AF ?- CXR w/ probable atelectasis. ? PNA w/ elevated PCT 1.46>>1.23 ?- UA negative ?- Blood CxNGTD ?  ?11.  AKI on CKD:  ?-Creatinine baseline previously 1.2-1.4  ?-Creatinine 2.34>>> 1.4 ?-Likely Cardiorenal  ?-Continue to support output w/ milrinone ?-follow BMP w/ diuresis  ?  ?12. Hyperkalemia  ?- K 6.1 on admit resolved w/ Lokelma ? ? ?Length of Stay: 3 ? ?Glori Bickers, MD  ?07/29/2021, 11:57 AM ? ?Advanced Heart Failure Team ?Pager [phone] (M-F; 7a - 5p)  ?Please contact [facility] for night-coverage after hours (5p -7a ) and weekends on [email or web address] ? ? ? ?

## 2021-07-30 ENCOUNTER — Inpatient Hospital Stay (HOSPITAL_COMMUNITY)
Admission: AD | Disposition: A | Discharge status: Home / Self Care | Payer: Self-pay | Source: Ambulatory Visit | Attending: Internal Medicine

## 2021-07-30 DIAGNOSIS — I5023 Acute on chronic systolic (congestive) heart failure: Secondary | ICD-10-CM | POA: Diagnosis not present

## 2021-07-30 HISTORY — PX: RIGHT HEART CATH AND CORONARY ANGIOGRAPHY: CATH118264

## 2021-07-30 LAB — CBC WITH DIFFERENTIAL/PLATELET
Abs Immature Granulocytes: 0.08 10*3/uL — ABNORMAL HIGH (ref 0.00–0.07)
Basophils Absolute: 0.1 10*3/uL (ref 0.0–0.1)
Basophils Relative: 0 %
Eosinophils Absolute: 0.1 10*3/uL (ref 0.0–0.5)
Eosinophils Relative: 1 %
HCT: 38.8 % — ABNORMAL LOW (ref 39.0–52.0)
Hemoglobin: 13.8 g/dL (ref 13.0–17.0)
Immature Granulocytes: 1 %
Lymphocytes Relative: 13 %
Lymphs Abs: 1.7 10*3/uL (ref 0.7–4.0)
MCH: 31.7 pg (ref 26.0–34.0)
MCHC: 35.6 g/dL (ref 30.0–36.0)
MCV: 89.2 fL (ref 80.0–100.0)
Monocytes Absolute: 1.5 10*3/uL — ABNORMAL HIGH (ref 0.1–1.0)
Monocytes Relative: 12 %
Neutro Abs: 9.1 10*3/uL — ABNORMAL HIGH (ref 1.7–7.7)
Neutrophils Relative %: 73 %
Platelets: 367 10*3/uL (ref 150–400)
RBC: 4.35 MIL/uL (ref 4.22–5.81)
RDW: 15.9 % — ABNORMAL HIGH (ref 11.5–15.5)
WBC: 12.4 10*3/uL — ABNORMAL HIGH (ref 4.0–10.5)
nRBC: 0 % (ref 0.0–0.2)

## 2021-07-30 LAB — COMPREHENSIVE METABOLIC PANEL
ALT: 104 U/L — ABNORMAL HIGH (ref 0–44)
AST: 102 U/L — ABNORMAL HIGH (ref 15–41)
Albumin: 2.8 g/dL — ABNORMAL LOW (ref 3.5–5.0)
Alkaline Phosphatase: 97 U/L (ref 38–126)
Anion gap: 9 (ref 5–15)
BUN: 26 mg/dL — ABNORMAL HIGH (ref 6–20)
CO2: 28 mmol/L (ref 22–32)
Calcium: 8.9 mg/dL (ref 8.9–10.3)
Chloride: 98 mmol/L (ref 98–111)
Creatinine, Ser: 1.65 mg/dL — ABNORMAL HIGH (ref 0.61–1.24)
GFR, Estimated: 50 mL/min — ABNORMAL LOW (ref >60)
Glucose, Bld: 148 mg/dL — ABNORMAL HIGH (ref 70–99)
Potassium: 4 mmol/L (ref 3.5–5.1)
Sodium: 135 mmol/L (ref 135–145)
Total Bilirubin: 1.2 mg/dL (ref 0.3–1.2)
Total Protein: 6.9 g/dL (ref 6.5–8.1)

## 2021-07-30 LAB — POCT I-STAT EG7
Acid-Base Excess: 5 mmol/L — ABNORMAL HIGH (ref 0.0–2.0)
Acid-Base Excess: 7 mmol/L — ABNORMAL HIGH (ref 0.0–2.0)
Bicarbonate: 30.2 mmol/L — ABNORMAL HIGH (ref 20.0–28.0)
Bicarbonate: 32.6 mmol/L — ABNORMAL HIGH (ref 20.0–28.0)
Calcium, Ion: 1.11 mmol/L — ABNORMAL LOW (ref 1.15–1.40)
Calcium, Ion: 1.18 mmol/L (ref 1.15–1.40)
HCT: 42 % (ref 39.0–52.0)
HCT: 43 % (ref 39.0–52.0)
Hemoglobin: 14.3 g/dL (ref 13.0–17.0)
Hemoglobin: 14.6 g/dL (ref 13.0–17.0)
O2 Saturation: 49 %
O2 Saturation: 49 %
Potassium: 4.2 mmol/L (ref 3.5–5.1)
Potassium: 4.5 mmol/L (ref 3.5–5.1)
Sodium: 136 mmol/L (ref 135–145)
Sodium: 137 mmol/L (ref 135–145)
TCO2: 32 mmol/L (ref 22–32)
TCO2: 34 mmol/L — ABNORMAL HIGH (ref 22–32)
pCO2, Ven: 46.5 mmHg (ref 44–60)
pCO2, Ven: 48.9 mmHg (ref 44–60)
pH, Ven: 7.42 (ref 7.25–7.43)
pH, Ven: 7.432 — ABNORMAL HIGH (ref 7.25–7.43)
pO2, Ven: 26 mmHg — CL (ref 32–45)
pO2, Ven: 26 mmHg — CL (ref 32–45)

## 2021-07-30 LAB — BASIC METABOLIC PANEL
Anion gap: 12 (ref 5–15)
BUN: 29 mg/dL — ABNORMAL HIGH (ref 6–20)
CO2: 28 mmol/L (ref 22–32)
Calcium: 8.9 mg/dL (ref 8.9–10.3)
Chloride: 95 mmol/L — ABNORMAL LOW (ref 98–111)
Creatinine, Ser: 1.59 mg/dL — ABNORMAL HIGH (ref 0.61–1.24)
GFR, Estimated: 52 mL/min — ABNORMAL LOW (ref >60)
Glucose, Bld: 105 mg/dL — ABNORMAL HIGH (ref 70–99)
Potassium: 4.1 mmol/L (ref 3.5–5.1)
Sodium: 135 mmol/L (ref 135–145)

## 2021-07-30 LAB — POCT I-STAT 7, (LYTES, BLD GAS, ICA,H+H)
Acid-Base Excess: 5 mmol/L — ABNORMAL HIGH (ref 0.0–2.0)
Bicarbonate: 29.5 mmol/L — ABNORMAL HIGH (ref 20.0–28.0)
Calcium, Ion: 1.13 mmol/L — ABNORMAL LOW (ref 1.15–1.40)
HCT: 42 % (ref 39.0–52.0)
Hemoglobin: 14.3 g/dL (ref 13.0–17.0)
O2 Saturation: 96 %
Potassium: 4.1 mmol/L (ref 3.5–5.1)
Sodium: 138 mmol/L (ref 135–145)
TCO2: 31 mmol/L (ref 22–32)
pCO2 arterial: 42 mmHg (ref 32–48)
pH, Arterial: 7.454 — ABNORMAL HIGH (ref 7.35–7.45)
pO2, Arterial: 81 mmHg — ABNORMAL LOW (ref 83–108)

## 2021-07-30 LAB — MAGNESIUM: Magnesium: 1.7 mg/dL (ref 1.7–2.4)

## 2021-07-30 LAB — APTT
aPTT: >200 seconds (ref 24–36)
aPTT: 98 seconds — ABNORMAL HIGH (ref 24–36)

## 2021-07-30 LAB — HEPARIN LEVEL (UNFRACTIONATED)
Heparin Unfractionated: >1.1 IU/mL — ABNORMAL HIGH (ref 0.30–0.70)
Heparin Unfractionated: 0.93 IU/mL — ABNORMAL HIGH (ref 0.30–0.70)

## 2021-07-30 LAB — HEPATITIS C ANTIBODY: HCV Ab: NONREACTIVE

## 2021-07-30 LAB — HEPATITIS B SURFACE ANTIBODY,QUALITATIVE: Hep B S Ab: NONREACTIVE

## 2021-07-30 LAB — HEPATITIS B CORE ANTIBODY, TOTAL: Hep B Core Total Ab: NONREACTIVE

## 2021-07-30 LAB — ANTITHROMBIN III: AntiThromb III Func: 70 % — ABNORMAL LOW (ref 75–120)

## 2021-07-30 LAB — COOXEMETRY PANEL
Carboxyhemoglobin: 2.1 % — ABNORMAL HIGH (ref 0.5–1.5)
Methemoglobin: <0.7 % (ref 0.0–1.5)
O2 Saturation: 55.4 %
Total hemoglobin: 12.7 g/dL (ref 12.0–16.0)

## 2021-07-30 LAB — LACTATE DEHYDROGENASE: LDH: 311 U/L — ABNORMAL HIGH (ref 98–192)

## 2021-07-30 SURGERY — RIGHT HEART CATH AND CORONARY ANGIOGRAPHY
Anesthesia: LOCAL

## 2021-07-30 MED ORDER — SODIUM CHLORIDE 0.9% IV SOLUTION: INTRAVENOUS | Status: DC

## 2021-07-30 MED ORDER — MAGNESIUM SULFATE 2 GM/50ML IV SOLN
2.0000 g | Freq: Once | INTRAVENOUS | Status: AC
  Administered 2021-07-30: 2 g via INTRAVENOUS
  Filled 2021-07-30: qty 50

## 2021-07-30 MED ORDER — MENTHOL 3 MG MT LOZG
1.0000 | LOZENGE | OROMUCOSAL | Status: DC | PRN
  Filled 2021-07-30: qty 9

## 2021-07-30 MED ORDER — HEPARIN (PORCINE) IN NACL 1000-0.9 UT/500ML-% IV SOLN
INTRAVENOUS | Status: AC
  Filled 2021-07-30: qty 1000

## 2021-07-30 MED ORDER — HEPARIN (PORCINE) 25000 UT/250ML-% IV SOLN
1200.0000 [IU]/h | INTRAVENOUS | Status: DC
  Administered 2021-07-30 – 2021-08-03 (×5): 1400 [IU]/h via INTRAVENOUS
  Administered 2021-08-03: 1300 [IU]/h via INTRAVENOUS
  Administered 2021-08-04 – 2021-08-05 (×2): 1200 [IU]/h via INTRAVENOUS
  Filled 2021-07-30 (×7): qty 250

## 2021-07-30 MED ORDER — SODIUM CHLORIDE 0.9% FLUSH
10.0000 mL | Freq: Two times a day (BID) | INTRAVENOUS | Status: DC
  Administered 2021-07-30 – 2021-08-08 (×16): 10 mL
  Administered 2021-08-08: 30 mL
  Administered 2021-08-09 – 2021-08-10 (×2): 10 mL
  Administered 2021-08-10: 20 mL
  Administered 2021-08-11: 10 mL
  Administered 2021-08-11: 20 mL
  Administered 2021-08-12 – 2021-08-14 (×6): 10 mL
  Administered 2021-08-15: 20 mL
  Administered 2021-08-15: 10 mL

## 2021-07-30 MED ORDER — FENTANYL CITRATE (PF) 100 MCG/2ML IJ SOLN
INTRAMUSCULAR | Status: AC
  Filled 2021-07-30: qty 2

## 2021-07-30 MED ORDER — SODIUM CHLORIDE 0.9% FLUSH: 10.0000 mL | INTRAVENOUS | Status: DC | PRN

## 2021-07-30 MED ORDER — FENTANYL CITRATE (PF) 100 MCG/2ML IJ SOLN
INTRAMUSCULAR | Status: DC | PRN
  Administered 2021-07-30: 25 ug via INTRAVENOUS

## 2021-07-30 MED ORDER — LIDOCAINE HCL (PF) 1 % IJ SOLN
INTRAMUSCULAR | Status: DC | PRN
  Administered 2021-07-30 (×2): 2 mL

## 2021-07-30 MED ORDER — VERAPAMIL HCL 2.5 MG/ML IV SOLN
INTRAVENOUS | Status: AC
  Filled 2021-07-30: qty 2

## 2021-07-30 MED ORDER — HEPARIN SODIUM (PORCINE) 1000 UNIT/ML IJ SOLN
INTRAMUSCULAR | Status: AC
  Filled 2021-07-30: qty 10

## 2021-07-30 MED ORDER — LIDOCAINE HCL (PF) 1 % IJ SOLN
INTRAMUSCULAR | Status: AC
  Filled 2021-07-30: qty 30

## 2021-07-30 MED ORDER — SODIUM CHLORIDE 0.9 % IV SOLN: INTRAVENOUS | Status: DC

## 2021-07-30 MED ORDER — MIDAZOLAM HCL 2 MG/2ML IJ SOLN
INTRAMUSCULAR | Status: DC | PRN
Start: 2021-07-30 — End: 2021-07-30
  Administered 2021-07-30: 2 mg via INTRAVENOUS

## 2021-07-30 MED ORDER — SODIUM CHLORIDE 0.9% IV SOLUTION: INTRAVENOUS | Status: DC | PRN

## 2021-07-30 MED ORDER — MIDAZOLAM HCL 2 MG/2ML IJ SOLN
INTRAMUSCULAR | Status: AC
  Filled 2021-07-30: qty 2

## 2021-07-30 MED ORDER — IOHEXOL 350 MG/ML SOLN
INTRAVENOUS | Status: DC | PRN
  Administered 2021-07-30: 40 mL

## 2021-07-30 MED ORDER — MILRINONE LACTATE IN DEXTROSE 20-5 MG/100ML-% IV SOLN
0.2500 ug/kg/min | INTRAVENOUS | Status: DC
  Administered 2021-07-30 (×2): 0.125 ug/kg/min via INTRAVENOUS
  Administered 2021-07-31 – 2021-08-01 (×3): 0.25 ug/kg/min via INTRAVENOUS
  Administered 2021-08-01 – 2021-08-06 (×12): 0.375 ug/kg/min via INTRAVENOUS
  Administered 2021-08-06 – 2021-08-08 (×3): 0.25 ug/kg/min via INTRAVENOUS
  Filled 2021-07-30 (×19): qty 100

## 2021-07-30 MED ORDER — HEPARIN (PORCINE) IN NACL 1000-0.9 UT/500ML-% IV SOLN
INTRAVENOUS | Status: DC | PRN
  Administered 2021-07-30 (×2): 500 mL

## 2021-07-30 SURGICAL SUPPLY — 15 items
CATH BALLN WEDGE 5F 110CM (CATHETERS) ×1 IMPLANT
CATH INFINITI 5 FR JL3.5 (CATHETERS) ×1 IMPLANT
CATH INFINITI JR4 5F (CATHETERS) ×1 IMPLANT
CATH SWAN GANZ 7F STRAIGHT (CATHETERS) ×1 IMPLANT
DEVICE RAD COMP TR BAND LRG (VASCULAR PRODUCTS) ×1 IMPLANT
GLIDESHEATH SLEND SS 6F .021 (SHEATH) ×1 IMPLANT
GUIDEWIRE INQWIRE 1.5J.035X260 (WIRE) IMPLANT
INQWIRE 1.5J .035X260CM (WIRE) ×2
KIT MICROPUNCTURE NIT STIFF (SHEATH) ×1 IMPLANT
PACK CARDIAC CATHETERIZATION (CUSTOM PROCEDURE TRAY) ×2 IMPLANT
SHEATH GLIDE SLENDER 4/5FR (SHEATH) ×1 IMPLANT
SHEATH PINNACLE 7F 10CM (SHEATH) ×1 IMPLANT
SHEATH PROBE COVER 6X72 (BAG) ×1 IMPLANT
SLEEVE REPOSITIONING LENGTH 30 (MISCELLANEOUS) ×1 IMPLANT
TRANSDUCER W/STOPCOCK (MISCELLANEOUS) ×2 IMPLANT

## 2021-07-30 NOTE — Progress Notes (Signed)
ANTICOAGULATION CONSULT NOTE  ? ?Pharmacy Consult for heparin ?Indication:  LV thrombus  (history of LV thrombus and new acute LV thrombus) ? ?Allergies  ?Allergen Reactions  ? Celexa [Citalopram Hydrobromide] Other (See Comments)  ?  Makes the patient disoriented  ? Effexor [Venlafaxine] Other (See Comments)  ?  Altered patient's thinking process(confused)  ? Lisinopril Cough  ? ? ?Patient Measurements: ?Height: 5\' 9"  (175.3 cm) ?Weight: 94.8 kg (209 lb) ?IBW/kg (Calculated) : 70.7 ?Heparin Dosing Weight: 91kg ? ?Vital Signs: ?Temp: 98.4 ?F (36.9 ?C) (05/01 0323) ?Temp Source: Oral (05/01 0323) ?BP: 127/94 (05/01 0323) ?Pulse Rate: 113 (05/01 0323) ? ?Labs: ?Recent Labs  ?  07/28/21 ?0209 07/28/21 ?1124 07/29/21 ?0336 07/30/21 ?[id number] 07/30/21 ?[id number] 07/30/21 ?[id number]  ?HGB 13.0  --  13.8 13.8  --   --   ?HCT 37.3*  --  38.3* 38.8*  --   --   ?PLT 316  --  376 367  --   --   ?APTT 148*   < > 83* >200*  --  98*  ?HEPARINUNFRC >1.10*   < > 0.89*  --  >1.10* 0.93*  ?CREATININE 1.40*  --  1.41* 1.59*  --   --   ? < > = values in this interval not displayed.  ? ? ? ?Estimated Creatinine Clearance: 62.4 mL/min (A) (by C-G formula based on SCr of 1.59 mg/dL (H)). ? ? ?Medical History: ?Past Medical History:  ?Diagnosis Date  ? AICD (automatic cardioverter/defibrillator) present 07/03/2017  ? Anxiety   ? CHF (congestive heart failure) ([place])   ? Essential hypertension   ? History of alcohol abuse   ? a. Sober since Feb 2016.  ? 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 Documented n [place]  ? NICM (nonischemic cardiomyopathy) ([place])   ? a. 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  ? ? ?Assessment: 51 year old male with history of mural thrombus on chronic apixaban, now with new acute LV thrombus. New orders to change to IV heparin. CBC currently  within normal limits.  ?Last apixaban dose was am 4/28 > heparin  started in afternoon for planned cath 5/1 ? ?Heparin level and aptt not correlating due to apixaban interference.  Earlier labs collected were incorrectly drawn therefore new orders entered for peripheral collection. Heparin running in PICC drip rate 1400 uts/hr aptt drawn peripherally 98 sec at goal. RN reports no issues with infusion or overt s/sx of bleeding.  CBC stable ? ?Goal of Therapy:  ?Heparin level 0.3-0.7 units/ml ?aPTT 66-102 seconds ?Monitor platelets by anticoagulation protocol: Yes ?  ?Plan:  ?Continue IV heparin  1400 units/hr ?Daily aPTT, heparin level and CBC. ?Follow up cath for oral anticoagulation  ? ?Georga Bora, PharmD ?Clinical Pharmacist ?07/30/2021 4:10 AM ?Please check AMION for all [facility] numbers ? ? ? ? ?

## 2021-07-30 NOTE — Progress Notes (Signed)
ANTICOAGULATION CONSULT NOTE  ? ?Pharmacy Consult for heparin ?Indication:  LV thrombus  (history of LV thrombus and new acute LV thrombus) ? ?Allergies  ?Allergen Reactions  ? Celexa [Citalopram Hydrobromide] Other (See Comments)  ?  Makes the patient disoriented  ? Effexor [Venlafaxine] Other (See Comments)  ?  Altered patient's thinking process(confused)  ? Lisinopril Cough  ? ? ?Patient Measurements: ?Height: 5\' 9"  (175.3 cm) ?Weight: 94 kg (207 lb 3.7 oz) ?IBW/kg (Calculated) : 70.7 ?Heparin Dosing Weight: 91kg ? ?Vital Signs: ?Temp: 97.9 ?F (36.6 ?C) (05/01 1405) ?Temp Source: Oral (05/01 1405) ?BP: 128/98 (05/01 1405) ?Pulse Rate: 127 (05/01 1600) ? ?Labs: ?Recent Labs  ?  07/28/21 ?0209 07/28/21 ?1124 07/29/21 ?0336 07/30/21 ?[id number] 07/30/21 ?[id number] 07/30/21 ?[id number]  ?HGB 13.0  --  13.8 13.8  --   --   ?HCT 37.3*  --  38.3* 38.8*  --   --   ?PLT 316  --  376 367  --   --   ?APTT 148*   < > 83* >200*  --  98*  ?HEPARINUNFRC >1.10*   < > 0.89*  --  >1.10* 0.93*  ?CREATININE 1.40*  --  1.41* 1.59*  --   --   ? < > = values in this interval not displayed.  ? ? ? ?Estimated Creatinine Clearance: 62.2 mL/min (A) (by C-G formula based on SCr of 1.59 mg/dL (H)). ? ? ?Medical History: ?Past Medical History:  ?Diagnosis Date  ? AICD (automatic cardioverter/defibrillator) present 07/03/2017  ? Anxiety   ? CHF (congestive heart failure) ([place])   ? Essential hypertension   ? History of alcohol abuse   ? a. Sober since Feb 2016.  ? 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 Documented n [place]  ? NICM (nonischemic cardiomyopathy) ([place])   ? a. 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  ? ? ?Assessment: 51 year old male with history of mural thrombus on chronic apixaban, now with new acute LV thrombus. New orders to change to IV heparin. CBC  currently within normal limits.  ?Last apixaban dose was am 4/28 > heparin  started in afternoon for planned cath 5/1 ? ?Heparin level and aptt not correlating due to apixaban interference.  Earlier labs collected were incorrectly drawn therefore new orders entered for peripheral collection. Heparin running in PICC drip rate 1400 uts/hr aptt drawn peripherally 98 sec at goal. RN reports no issues with infusion or overt s/sx of bleeding.  CBC stable ? ?Post cath now has PIV RN will resume heparin through that and draw labs via PICC  ?F/u TR band removal and resume heparin tonight  ? ?Goal of Therapy:  ?Heparin level 0.3-0.7 units/ml ?aPTT 66-102 seconds ?Monitor platelets by anticoagulation protocol: Yes ?  ?Plan:  ?Resume IV heparin  1400 units/hr tonight at 8pm ?Daily aPTT, heparin level and CBC. ? ? ? ?Bonnita Nasuti Pharm.D. CPP, BCPS ?Clinical Pharmacist ?[phone] ?07/30/2021 4:53 PM  ? ?Please check AMION for all [facility] numbers ? ? ? ? ?

## 2021-07-30 NOTE — Progress Notes (Signed)
[facility] EDUCATION NOTE:   ?             ?VAD evaluation consent reviewed and signed by Robert Booth. ? ?VAD educational packet including "Understanding Your Options with Advanced Heart Failure", "[place] Patient Agreement for VAD Evaluation and Potential Implantation" consent, and [employer] "Heartmate 3 Left Ventricular Device (LVAD) Patient Guide", Heartmate 3 Left Ventricular Assist System Patient Education Program DVD", "[place] HM III Patient Education", "[place] Mechanical Circulatory Support Program", and "Decision Aids for Left Ventricular Assist Device" reviewed in detail and left at bedside for continued reference.   All questions answered regarding VAD implant, hospital stay, and what to expect when discharged home living with a heart pump. Pt identified his mom - Robert Booth as his primary caregiver if this therapy should be deemed appropriate for him.  Explained need for 24/7 care when pt is discharged home due to sternal precautions, adaptation to living on support, emotional support, consistent and meticulous exit site care and management, medication adherence and high volume of follow up visits with the VAD Clinic after discharge; both pt and caregiver verbalized understanding of above.  ? ?Explained that LVAD can be implanted for two indications in the setting of advanced left ventricular heart failure treatment: ? ?Bridge to transplant - used for patients who cannot safely wait for heart transplant without this device. ? ?Or ?   ?Destination therapy - used for patients until end of life or recovery of heart function. ? ?Patient and caregiver(s) acknowledge that the indication at this point in time for LVAD therapy would be for social barriers.  ? ?Reviewed and supplied a copy of home inspection check list stressing that only three pronged grounded power outlets can be used for VAD equipment. Robert Booth confirmed home has electrical outlets that will support the equipment along with access working  telephone. ? ?Identified the following lifestyle modifications while living on MCS:   ? ?1. No driving for at least three months and then only if doctor gives permission to do so.   ?2. No tub baths while pump implanted, and shower only when doctor gives permission.   ?3. No swimming or submersion in water while implanted with pump.   ?4. No contact sports or engaging in jumping activities.   ?5. Always have a backup controller, charged spare batteries, and battery clips nearby at all times in case of emergency.   ?6. Call the doctor or hospital contact person if any change in how the pump sounds, feels, or works.   ?7. Plan to sleep only when connected to the power module.   ?8. Do not sleep on your stomach.   ?9. Keep a backup system controller, charged batteries, battery clips, and flashlight near you during sleep in case of electrical power outage.   ?10. Exit site care including dressing changes, monitoring for infection, and importance of keeping percutaneous lead stabilized at all times.  ?  ? ?Extended the option to have one of our current patients and caregiver(s) come to talk with them about living on support to assist with decision making.  ? ?Reviewed pictures of VAD drive line, site care, dressing changes, and drive line stabilization including securement attachment device and abdominal binder. Discussed with pt and family that they will be required to purchase dressing supplies as long as patient has the VAD in place.  ? ?Intermacs patient survival statistics through September 2022 reviewed with patient and caregiver as follows: ? ? ? ?                                            ?  The patient understands that from this discussion it does not mean that they will receive the device, but that depends on an extensive evaluation process. The patient is aware of the fact that if at anytime they want to stop the evaluation process they can. ? ?All questions have been answered at this time and contact  information was provided should they encounter any further questions.  Pt agrees to the evaluation process but states that he would like to "give the medications time to work, and start exercising to increase my heart muscle first." Pt states that he lives along, and uses Medicaid transport to get back and forth from clinic. Pt tells me that he has a drivers license but no car. Pt states that he wants to live to see his 4 grandkids grow up. Pt states that he will talk to his mom about helping him if he is deemed a VAD candidate. Pt states that he only would want the VAD "as a last resort." VAD coordinator did not demostrate equipment pt states that he is "tired and hungry." ? ?Orders placed. ? ? ?Total Session Time: 30 minutes ? ?Robert Adam, RN ?VAD Coordinator  ? ?Office: [phone] ?24/7 VAD Pager: [phone] ? ?

## 2021-07-30 NOTE — Progress Notes (Addendum)
? ? Advanced Heart Failure Rounding Note ? ?PCP-Cardiologist: Dr. Haroldine Laws  ? ?Subjective:   ? ?4/26: Admitted w/ CS + marked volume overload. Echo severe BiV failure EF 15% + large LV thrombus ? ?On Milrinone 0.25. Co-ox 55% ?Creatinine peaked 2.35-->1.4>1.6 today  ? ?Remains on IV lasix. Negative 1.7 liters. Weight down another 2 pounds (12 pounds total). CVP 10-11 ? ?Feeling a little better. Denies SOB.  ? ?Objective:   ?Weight Range: ?94.2 kg ?Body mass index is 30.67 kg/m?.  ? ?Vital Signs:   ?Temp:  [97.9 ?F (36.6 ?C)-98.6 ?F (37 ?C)] 98.1 ?F (36.7 ?C) (05/01 0802) ?Pulse Rate:  [113-132] 132 (05/01 0802) ?Resp:  [14-20] 19 (05/01 0802) ?BP: (117-138)/(93-105) 133/105 (05/01 0802) ?SpO2:  [93 %-97 %] 94 % (05/01 0802) ?Weight:  [94.2 kg] 94.2 kg (05/01 0507) ?Last BM Date : 07/28/21 ? ?Weight change: ?Filed Weights  ? 07/28/21 0500 07/29/21 0400 07/30/21 0507  ?Weight: 95.9 kg 94.8 kg 94.2 kg  ? ? ?Intake/Output:  ? ?Intake/Output Summary (Last 24 hours) at 07/30/2021 0849 ?Last data filed at 07/30/2021 0705 ?Gross per 24 hour  ?Intake 620 ml  ?Output 3320 ml  ?Net -2700 ml  ? ?CVP 10-11  ?Physical Exam  ?  ?General:  In bed.  No resp difficulty ?HEENT: normal ?Neck: supple. JVP 10-11 . Carotids 2+ bilat; no bruits. No lymphadenopathy or thryomegaly appreciated. ?Cor: PMI nondisplaced. Tachy Regular rate & rhythm. No rubs, gallops or murmurs. ?Lungs: clear ?Abdomen: soft, nontender, nondistended. No hepatosplenomegaly. No bruits or masses. Good bowel sounds. ?Extremities: no cyanosis, clubbing, rash, edema. RUE PICC  ?Neuro: alert & orientedx3, cranial nerves grossly intact. moves all 4 extremities w/o difficulty. Affect pleasant ? ? ? ?Telemetry  ? ?Sinus tach 110-130s  Obtain EKG ? ?Labs  ?  ?CBC ?Recent Labs  ?  07/29/21 ?0336 07/30/21 ?[id number]  ?WBC 12.5* 12.4*  ?NEUTROABS 9.1* 9.1*  ?HGB 13.8 13.8  ?HCT 38.3* 38.8*  ?MCV 88.7 89.2  ?PLT 376 367  ? ?Basic Metabolic Panel ?Recent Labs  ?  07/29/21 ?0336  07/30/21 ?[id number]  ?NA 136 135  ?K 4.3 4.1  ?CL 96* 95*  ?CO2 31 28  ?GLUCOSE 93 105*  ?BUN 25* 29*  ?CREATININE 1.41* 1.59*  ?CALCIUM 8.6* 8.9  ?MG 2.0 1.7  ? ?Liver Function Tests ?No results for input(s): AST, ALT, ALKPHOS, BILITOT, PROT, ALBUMIN in the last 72 hours. ?No results for input(s): LIPASE, AMYLASE in the last 72 hours. ?Cardiac Enzymes ?No results for input(s): CKTOTAL, CKMB, CKMBINDEX, TROPONINI in the last 72 hours. ? ?BNP: ?BNP (last 3 results) ?Recent Labs  ?  04/06/21 ?1121 07/05/21 ?[id number] 07/26/21 ?1243  ?BNP 42.4 3,398.7* 1,528.0*  ? ? ?ProBNP (last 3 results) ?No results for input(s): PROBNP in the last 8760 hours. ? ? ?D-Dimer ?No results for input(s): DDIMER in the last 72 hours. ?Hemoglobin A1C ?Recent Labs  ?  07/28/21 ?0209  ?HGBA1C 4.7*  ? ?Fasting Lipid Panel ?Recent Labs  ?  07/28/21 ?0209  ?CHOL 113  ?HDL 31*  ?[place] 70  ?TRIG 61  ?CHOLHDL 3.6  ? ?Thyroid Function Tests ?No results for input(s): TSH, T4TOTAL, T3FREE, THYROIDAB in the last 72 hours. ? ?Invalid input(s): FREET3 ? ?Other results: ? ? ?Imaging  ? ? ?No results found. ? ? ?Medications:   ? ? ?Scheduled Medications: ? atorvastatin  20 mg Oral Daily  ? Chlorhexidine Gluconate Cloth  6 each Topical Daily  ? digoxin  0.125 mg Oral Daily  ?  furosemide  80 mg Intravenous BID  ? gabapentin  600 mg Oral BID  ? mexiletine  300 mg Oral BID  ? pantoprazole  40 mg Oral Daily  ? potassium chloride  40 mEq Oral BID  ? sertraline  200 mg Oral Daily  ? sodium chloride flush  3 mL Intravenous Q12H  ? spironolactone  12.5 mg Oral Daily  ? ? ?Infusions: ? sodium chloride    ? sodium chloride 10 mL/hr at 07/30/21 0443  ? heparin 1,400 Units/hr (07/30/21 0437)  ? milrinone 0.25 mcg/kg/min (07/29/21 2306)  ? ? ?PRN Medications: ?sodium chloride, acetaminophen, guaiFENesin-dextromethorphan, ondansetron (ZOFRAN) IV, sodium chloride flush ? ? ? ?Patient Profile  ? ?51 y/o male with severe systolic HF due to NICM. EF 15-20%. Presented to clinic with  low output symptoms and marked fluid overload. Not responding to oral diuretics.  ?  ?ICD interrogated. No VT but HL score 77.  ?  ?Admitted to ICU. Lactate 2.9. Echo w/ large LV thrombus, EF 15%, RV mod reduced, severe TR   ? ?Assessment/Plan  ? ?1. Acute on chronic systolic HF >>Cardiogenic Shock  ?- NICM. [employer] ICD ?- Cath ok performed [place] 2016. 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  ?- Echo 10/2016 EF 10-15%. LV dysfunction after stopping HF meds. ?- Echo 03/01/21 EF 20-25% RV moderately reduced ?- cMRI 2/19 EF 19%. RV mildly decreased .  ?- CPX 11/20 with mild functional limitation; no clear HF limitation despite EF<20% ?- now admitted with cardiogenic shock and marked fluid overload. Initial lactate 2.9. Started on Milrinone ?- Echo 4/23 EF 15%, + LV thrombus, RV mod reduced, severe TR  ?- on Milrinone 0.25. Co-ox 55. Cut back milrinone 0.125 mcg.  ?- Diuresing well. Weight down 12 pounds ?- CVP 10-11 . Continue IV Lasix 80 mg bid ?- Off GDMT due to AKI/Hyperkalemia/shock.  ?- Continue digoxin and spiro today ?-  R/L cath today.  ?- May need to consider advanced therapies. Not sure he is a candidate for VAD. Has been more adherent with medical therapy recently. Lives alone.  ?  ?2. LV mural thrombus  ?- He has been off warfarin ?- No thrombus on echo 12/22.  ?- Echo this admit w/ LV thrombus.  ?- Off Eliquis -> heparin for cath ?- No obvious bleeding ?  ?3. H/o ETOH abuse ?- previously heavy drinker . No ETOH since 2021.  ?  ?4. VT 07/08/17  ?- He is s/p [employer] Sci ICD 07/03/17. ?- Multiple episodes sustained VT on device check 06/621. Mexiletine increased to 300 mg BID. Just one episode NSVT since last check.  ?- Continue mexiletine at current dose. ?- Keep K > 4.0 and Mg > 2.0  ? - Given 4 gram Mag ? ?5. Gout ?- On allopurinol 300 mg daily.  ?- ? If acute gout flare contributing to foot pain ?- uric acid 7.6 ?  ?6. 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 ?- Continues to struggle with  balance/mobility.  ?  ?7. Anxiety/Depression ?- Follows with [facility] [place]. ?  ?8. OSA ?- Struggling with adjusting to CPAP ?  ?9. Hemoptysis ?- Hgb stable 12.4  ?- likely 2/2 pulmonary edema -> resolved with diuresis ?- CXR ok ?  ?10. ID  ?- WBC 15.1>>13.4K. AF ?- CXR w/ probable atelectasis. ? PNA w/ elevated PCT 1.46>>1.23 ?- UA negative ?- Blood Cx-NGTD ?  ?11. AKI on CKD:  ?-Creatinine baseline previously 1.2-1.4  ?-Creatinine 2.34>>> 1.6 ?-Likely Cardiorenal  ?-  Continue to support output w/ milrinone ?-follow BMP w/ diuresis  ?  ?12. Hyperkalemia  ?- K 6.1 on admit resolved w/ Lokelma ? ?13. Tachycardia ?Check EKG. ? A flutter/MAT ? ? ?Length of Stay: 4 ? ?Darrick Grinder, NP  ?07/30/2021, 8:49 AM ? ?Advanced Heart Failure Team ?Pager [phone] (M-F; 7a - 5p)  ?Please contact [facility] for night-coverage after hours (5p -7a ) and weekends on [email or web address] ? ?Agree with above ? ?Cath today with normal coronary arteries. Low output (CI 1.4) and high filling pressures (PCWP 30). Feels fatigued and SOB. Very tachycardic ? ?General:  Weak appearing. No resp difficulty ?HEENT: normal ?Neck: supple. RIJ swan Carotids 2+ bilat; no bruits. No lymphadenopathy or thryomegaly appreciated. ?Cor: PMI nondisplaced. Tachy regular + s3. ?Lungs: clear ?Abdomen: soft, nontender, nondistended. No hepatosplenomegaly. No bruits or masses. Good bowel sounds. ?Extremities: no cyanosis, clubbing, rash, 1+ edema cool  ?Neuro: alert & orientedx3, cranial nerves grossly intact. moves all 4 extremities w/o difficulty. Affect pleasant ? ?He is extremely tenuous. Have moved to ICU. Increase milrinone. Restart IV diuresis. May need IABP. Will have VAD team see.  ? ? ?CRITICAL CARE ?Performed by: Glori Bickers ? ?Total critical care time: 35 minutes ? ?Critical care time was exclusive of separately billable procedures and treating other patients. ? ?Critical care was necessary to treat or prevent imminent or life-threatening  deterioration. ? ?Critical care was time spent personally by me (independent of midlevel providers or residents) on the following activities: development of treatment plan with patient and/or surrogate as well as n

## 2021-07-30 NOTE — Plan of Care (Signed)
?  Problem: Education: ?Goal: Knowledge of General Education information will improve ?Description: Including pain rating scale, medication(s)/side effects and non-pharmacologic comfort measures ?Outcome: Progressing ?  ?Problem: Health Behavior/Discharge Planning: ?Goal: Ability to manage health-related needs will improve ?Outcome: Progressing ?  ?Problem: Clinical Measurements: ?Goal: Will remain free from infection ?Outcome: Progressing ?Goal: Respiratory complications will improve ?Outcome: Progressing ?  ?Problem: Activity: ?Goal: Risk for activity intolerance will decrease ?Outcome: Progressing ?  ?Problem: Nutrition: ?Goal: Adequate nutrition will be maintained ?Outcome: Progressing ?  ?Problem: Elimination: ?Goal: Will not experience complications related to bowel motility ?Outcome: Progressing ?Goal: Will not experience complications related to urinary retention ?Outcome: Progressing ?  ?

## 2021-07-31 ENCOUNTER — Other Ambulatory Visit (HOSPITAL_COMMUNITY): Payer: Self-pay | Admitting: Respiratory Therapy

## 2021-07-31 ENCOUNTER — Encounter (HOSPITAL_COMMUNITY): Payer: Self-pay | Admitting: Internal Medicine

## 2021-07-31 ENCOUNTER — Inpatient Hospital Stay (HOSPITAL_COMMUNITY): Payer: Medicaid Other

## 2021-07-31 ENCOUNTER — Other Ambulatory Visit (HOSPITAL_COMMUNITY): Payer: Self-pay

## 2021-07-31 DIAGNOSIS — I5023 Acute on chronic systolic (congestive) heart failure: Secondary | ICD-10-CM | POA: Diagnosis not present

## 2021-07-31 DIAGNOSIS — I240 Acute coronary thrombosis not resulting in myocardial infarction: Secondary | ICD-10-CM

## 2021-07-31 DIAGNOSIS — I428 Other cardiomyopathies: Secondary | ICD-10-CM

## 2021-07-31 DIAGNOSIS — Z515 Encounter for palliative care: Secondary | ICD-10-CM | POA: Diagnosis not present

## 2021-07-31 LAB — CBC WITH DIFFERENTIAL/PLATELET
Abs Immature Granulocytes: 0.07 10*3/uL (ref 0.00–0.07)
Basophils Absolute: 0.1 10*3/uL (ref 0.0–0.1)
Basophils Relative: 0 %
Eosinophils Absolute: 0.1 10*3/uL (ref 0.0–0.5)
Eosinophils Relative: 1 %
HCT: 39.9 % (ref 39.0–52.0)
Hemoglobin: 13.5 g/dL (ref 13.0–17.0)
Immature Granulocytes: 1 %
Lymphocytes Relative: 11 %
Lymphs Abs: 1.4 10*3/uL (ref 0.7–4.0)
MCH: 30.8 pg (ref 26.0–34.0)
MCHC: 33.8 g/dL (ref 30.0–36.0)
MCV: 90.9 fL (ref 80.0–100.0)
Monocytes Absolute: 1.1 10*3/uL — ABNORMAL HIGH (ref 0.1–1.0)
Monocytes Relative: 9 %
Neutro Abs: 9.5 10*3/uL — ABNORMAL HIGH (ref 1.7–7.7)
Neutrophils Relative %: 78 %
Platelets: 356 10*3/uL (ref 150–400)
RBC: 4.39 MIL/uL (ref 4.22–5.81)
RDW: 15.9 % — ABNORMAL HIGH (ref 11.5–15.5)
WBC: 12.2 10*3/uL — ABNORMAL HIGH (ref 4.0–10.5)
nRBC: 0 % (ref 0.0–0.2)

## 2021-07-31 LAB — MAGNESIUM: Magnesium: 2.2 mg/dL (ref 1.7–2.4)

## 2021-07-31 LAB — PULMONARY FUNCTION TEST
DL/VA % pred: 125 %
DL/VA: 5.56 ml/min/mmHg/L
DLCO cor % pred: 99 %
DLCO cor: 28.29 ml/min/mmHg
DLCO unc % pred: 96 %
DLCO unc: 27.37 ml/min/mmHg
FEF 25-75 Pre: 2.39 L/sec
FEF2575-%Pred-Pre: 74 %
FEV1-%Pred-Pre: 81 %
FEV1-Pre: 2.62 L
FEV1FVC-%Pred-Pre: 98 %
FEV6-%Pred-Pre: 84 %
FEV6-Pre: 3.31 L
FEV6FVC-%Pred-Pre: 101 %
FVC-%Pred-Pre: 82 %
FVC-Pre: 3.35 L
Pre FEV1/FVC ratio: 78 %
Pre FEV6/FVC Ratio: 99 %

## 2021-07-31 LAB — COOXEMETRY PANEL
Carboxyhemoglobin: 2.7 % — ABNORMAL HIGH (ref 0.5–1.5)
Methemoglobin: <0.7 % (ref 0.0–1.5)
O2 Saturation: 59.7 %
Total hemoglobin: 14.2 g/dL (ref 12.0–16.0)

## 2021-07-31 LAB — BASIC METABOLIC PANEL
Anion gap: 9 (ref 5–15)
BUN: 24 mg/dL — ABNORMAL HIGH (ref 6–20)
CO2: 29 mmol/L (ref 22–32)
Calcium: 8.9 mg/dL (ref 8.9–10.3)
Chloride: 94 mmol/L — ABNORMAL LOW (ref 98–111)
Creatinine, Ser: 1.47 mg/dL — ABNORMAL HIGH (ref 0.61–1.24)
GFR, Estimated: 57 mL/min — ABNORMAL LOW (ref >60)
Glucose, Bld: 150 mg/dL — ABNORMAL HIGH (ref 70–99)
Potassium: 4.4 mmol/L (ref 3.5–5.1)
Sodium: 132 mmol/L — ABNORMAL LOW (ref 135–145)

## 2021-07-31 LAB — RAPID URINE DRUG SCREEN, HOSP PERFORMED
Amphetamines: NOT DETECTED
Barbiturates: NOT DETECTED
Benzodiazepines: POSITIVE — AB
Cocaine: NOT DETECTED
Opiates: NOT DETECTED
Tetrahydrocannabinol: NOT DETECTED

## 2021-07-31 LAB — APTT: aPTT: 78 seconds — ABNORMAL HIGH (ref 24–36)

## 2021-07-31 LAB — HEPARIN LEVEL (UNFRACTIONATED): Heparin Unfractionated: 0.63 IU/mL (ref 0.30–0.70)

## 2021-07-31 MED ORDER — ALBUTEROL SULFATE (2.5 MG/3ML) 0.083% IN NEBU: 2.5000 mg | INHALATION_SOLUTION | Freq: Once | RESPIRATORY_TRACT | Status: DC

## 2021-07-31 MED ORDER — QUETIAPINE FUMARATE 100 MG PO TABS
100.0000 mg | ORAL_TABLET | Freq: Every day | ORAL | Status: DC
  Administered 2021-07-31 – 2021-08-15 (×16): 100 mg via ORAL
  Filled 2021-07-31 (×17): qty 1

## 2021-07-31 MED ORDER — ADULT MULTIVITAMIN W/MINERALS CH
1.0000 | ORAL_TABLET | Freq: Every day | ORAL | Status: DC
  Administered 2021-07-31 – 2021-08-16 (×17): 1 via ORAL
  Filled 2021-07-31 (×17): qty 1

## 2021-07-31 MED ORDER — IOHEXOL 9 MG/ML PO SOLN
ORAL | Status: AC
  Filled 2021-07-31: qty 1000

## 2021-07-31 MED ORDER — PROSOURCE PLUS PO LIQD
30.0000 mL | Freq: Two times a day (BID) | ORAL | Status: DC
  Administered 2021-08-01 – 2021-08-09 (×17): 30 mL via ORAL
  Filled 2021-07-31 (×16): qty 30

## 2021-07-31 NOTE — Progress Notes (Addendum)
? ? Advanced Heart Failure Rounding Note ? ?PCP-Cardiologist: Dr. Gala Romney  ? ?Subjective:   ? ?4/26: Admitted w/ CS + marked volume overload. Echo severe BiV failure EF 15% + large LV thrombus. Placed on milrinone and diuresed w/ IV Lasix.  ?5/1: Cath with normal coronary arteries. Low output (CI 1.4) and high filling pressures (PCWP 30). Moved back to ICU. Milrinone increased. IV Lasix restarted. VAD w/u initiated.  ? ?Today, on Milrinone 0.25. Co-ox improved from 49>>60% today.  ? ?Swan #s ?CVP 12  ?Thremo CI 1.77 ?Thermo CO 3.71 ?PAP 63/39 (49)  ?PAPi 2.0  ?Fick CI 2.5 ?Fick CO 5.31 ?Co-ox 60%  ? ?3.5L in UOP yesterday. Net negative 2L. Wt only down 2 lb. CVP 12 ? ?Scr improved, 1.65>>1.47 ? ?No current resting dyspnea.  ? ?Refused Pre-Vad CT scans. Uncertain if he wants VAD. Willing to meet w/ existing VAD patient and palliative care team today.  ? ? ? R/LHC 5/1 ? ?On milrinone 0.125 mcg/kg/min ?  ?Ao = 118/97 (105) ?RA = 13  ?RV = 64/13 (19) ?PA = 55/47 (50) ?PCW = 30 ?Fick cardiac output/index = 3.16/1.51 ?Thermo CO/CI = 3.19/1.52 ?PVR = 6.1 WU ?SVR = 2300 ?PAPi = 0.6 ?Ao sat = 96% ?PA sat = 46%, 46% ? ?Normal coronaries  ? ? ?Objective:   ?Weight Range: ?93.1 kg ?Body mass index is 30.31 kg/m?.  ? ?Vital Signs:   ?Temp:  [97.9 ?F (36.6 ?C)-99.3 ?F (37.4 ?C)] 99.1 ?F (37.3 ?C) (05/02 0700) ?Pulse Rate:  [117-136] 121 (05/02 0700) ?Resp:  [16-28] 20 (05/02 0700) ?BP: (103-130)/(84-103) 119/96 (05/02 0700) ?SpO2:  [89 %-97 %] 89 % (05/02 0700) ?Weight:  [93.1 kg-94 kg] 93.1 kg (05/02 0500) ?Last BM Date : 07/30/21 ? ?Weight change: ?Filed Weights  ? 07/30/21 0507 07/30/21 1405 07/31/21 0500  ?Weight: 94.2 kg 94 kg 93.1 kg  ? ? ?Intake/Output:  ? ?Intake/Output Summary (Last 24 hours) at 07/31/2021 0900 ?Last data filed at 07/31/2021 0700 ?Gross per 24 hour  ?Intake 1570.81 ml  ?Output 2800 ml  ?Net -1229.19 ml  ? ? ?Physical Exam  ?  ?CVP 12 ?General:  Well appearing. No respiratory difficulty ?HEENT:  normal ?Neck: supple. JVD elevated to jaw. Carotids 2+ bilat; no bruits. No lymphadenopathy or thyromegaly appreciated. ?Cor: PMI nondisplaced. Regular rhythm, tachy rate. No rubs, gallops or murmurs. ?Lungs: clear ?Abdomen: soft, nontender, nondistended. No hepatosplenomegaly. No bruits or masses. Good bowel sounds. ?Extremities: no cyanosis, clubbing, rash, edema + Rt IJ Swan  ?Neuro: alert & oriented x 3, cranial nerves grossly intact. moves all 4 extremities w/o difficulty. Affect pleasant. ? ?Telemetry  ? ?Sinus tach 110-130s   ? ?Labs  ?  ?CBC ?Recent Labs  ?  07/30/21 ?8185 07/30/21 ?1310 07/30/21 ?1326 07/31/21 ?0439  ?WBC 12.4*  --   --  12.2*  ?NEUTROABS 9.1*  --   --  9.5*  ?HGB 13.8   < > 14.6  14.3 13.5  ?HCT 38.8*   < > 43.0  42.0 39.9  ?MCV 89.2  --   --  90.9  ?PLT 367  --   --  356  ? < > = values in this interval not displayed.  ? ?Basic Metabolic Panel ?Recent Labs  ?  07/30/21 ?6314 07/30/21 ?1310 07/30/21 ?1700 07/31/21 ?0439  ?NA 135   < > 135 132*  ?K 4.1   < > 4.0 4.4  ?CL 95*  --  98 94*  ?CO2 28  --  28 29  ?GLUCOSE 105*  --  148* 150*  ?BUN 29*  --  26* 24*  ?CREATININE 1.59*  --  1.65* 1.47*  ?CALCIUM 8.9  --  8.9 8.9  ?MG 1.7  --   --  2.2  ? < > = values in this interval not displayed.  ? ?Liver Function Tests ?Recent Labs  ?  07/30/21 ?1700  ?AST 102*  ?ALT 104*  ?ALKPHOS 97  ?BILITOT 1.2  ?PROT 6.9  ?ALBUMIN 2.8*  ? ?No results for input(s): LIPASE, AMYLASE in the last 72 hours. ?Cardiac Enzymes ?No results for input(s): CKTOTAL, CKMB, CKMBINDEX, TROPONINI in the last 72 hours. ? ?BNP: ?BNP (last 3 results) ?Recent Labs  ?  04/06/21 ?1121 07/05/21 ?9604 07/26/21 ?1243  ?BNP 42.4 3,398.7* 1,528.0*  ? ? ?ProBNP (last 3 results) ?No results for input(s): PROBNP in the last 8760 hours. ? ? ?D-Dimer ?No results for input(s): DDIMER in the last 72 hours. ?Hemoglobin A1C ?No results for input(s): HGBA1C in the last 72 hours. ? ?Fasting Lipid Panel ?No results for input(s): CHOL, HDL,  LDLCALC, TRIG, CHOLHDL, LDLDIRECT in the last 72 hours. ? ?Thyroid Function Tests ?No results for input(s): TSH, T4TOTAL, T3FREE, THYROIDAB in the last 72 hours. ? ?Invalid input(s): FREET3 ? ?Other results: ? ? ?Imaging  ? ? ?CARDIAC CATHETERIZATION ? ?Result Date: 07/31/2021 ?Findings: On milrinone 0.125 mcg/kg/min Ao = 118/97 (105) RA = 13 RV = 64/13 (19) PA = 55/47 (50) PCW = 30 Fick cardiac output/index = 3.16/1.51 Thermo CO/CI = 3.19/1.52 PVR = 6.1 WU SVR = 2300 PAPi = 0.6 Ao sat = 96% PA sat = 46%, 46% Assessment: 1. Cardiogenic shock with severe biventricular failure and markedly elevated filling pressures 2. Moderate mixed pulmonary HTN Plan/Discussion: Move to ICU. Increase intotropes and diuretics. Will need to consider advanced therapies but PAPi quite low. Arvilla Meres, MD 7:29 PM ? ?Port CXR ? ?Result Date: 07/31/2021 ?CLINICAL DATA:  Central line placement EXAM: PORTABLE CHEST 1 VIEW COMPARISON:  Radiograph 07/26/2021 FINDINGS: Unchanged cardiomediastinal silhouette. There is a right neck catheter with tip overlying the main pulmonary artery. There is a right upper extremity PICC with tip overlying the distal superior vena cava. Single lead AICD partially visualized. There is no new airspace disease within the field of view. There is no visible pneumothorax. No acute osseous abnormality. IMPRESSION: Right neck catheter tip overlies the main pulmonary artery. Right upper extremity PICC tip overlies the distal superior vena cava. Electronically Signed   By: Caprice Renshaw M.D.   On: 07/31/2021 08:09   ? ? ?Medications:   ? ? ?Scheduled Medications: ? atorvastatin  20 mg Oral Daily  ? Chlorhexidine Gluconate Cloth  6 each Topical Daily  ? digoxin  0.125 mg Oral Daily  ? furosemide  80 mg Intravenous BID  ? gabapentin  600 mg Oral BID  ? iohexol      ? mexiletine  300 mg Oral BID  ? pantoprazole  40 mg Oral Daily  ? potassium chloride  40 mEq Oral BID  ? sertraline  200 mg Oral Daily  ? sodium chloride flush   10-40 mL Intracatheter Q12H  ? spironolactone  12.5 mg Oral Daily  ? ? ?Infusions: ? sodium chloride    ? sodium chloride 10 mL/hr at 07/30/21 2224  ? sodium chloride 10 mL/hr at 07/31/21 0700  ? heparin 1,400 Units/hr (07/31/21 0700)  ? milrinone 0.25 mcg/kg/min (07/31/21 0700)  ? ? ?PRN Medications: ?sodium chloride, acetaminophen, guaiFENesin-dextromethorphan,  menthol-cetylpyridinium, ondansetron (ZOFRAN) IV, sodium chloride flush ? ? ? ?Patient Profile  ? ?51 y/o male with severe systolic HF due to NICM. EF 15-20%. Presented to clinic with low output symptoms and marked fluid overload. Not responding to oral diuretics.  ?  ?ICD interrogated. No VT but HL score 77.  ?  ?Admitted to ICU. Lactate 2.9. Echo w/ large LV thrombus, EF 15%, RV mod reduced, severe TR   ? ?Assessment/Plan  ? ?1. Acute on chronic systolic HF >>Cardiogenic Shock  ?- NICM. [employer] ICD ?- Cath ok performed [place] 2016. 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  ?- Echo 10/2016 EF 10-15%. LV dysfunction after stopping HF meds. ?- Echo 03/01/21 EF 20-25% RV moderately reduced ?- cMRI 2/19 EF 19%. RV mildly decreased .  ?- CPX 11/20 with mild functional limitation; no clear HF limitation despite EF<20% ?- now admitted with cardiogenic shock and marked fluid overload. Initial lactate 2.9. Started on Milrinone ?- Echo 4/23 EF 15%, + LV thrombus, RV mod reduced, severe TR  ?- R/LHC on 0.125 of milrinone w/ low output (CI 1.4) and high filling pressers, normal cors. Milrinone increased back to 0.25 ?- CI up to 1.7 today. Co-ox improved 49>>60%  today. CVP 12  ?- Continue Milrinone 0.25. Not current candidate for transplant. Initiate VAD w/u. Follow swan hemodynamics, may need bridge w/ impella if he elects VAD   ?- Continue IV Lasix 80 mg bid ?- Off GDMT due to AKI/Hyperkalemia/shock.  ?- Continue digoxin and spiro today ? ?  ?2. LV mural thrombus  ?- He has been off warfarin ?- No thrombus on echo 12/22.  ?- Echo this admit w/ LV thrombus.  ?- Off  Eliquis -> heparin gtt until procedures completed  ?- No obvious bleeding ?  ?3. H/o ETOH abuse ?- previously heavy drinker . No ETOH since 2021.  ?  ?4. VT 07/08/17  ?- He is s/p [employer] Sci ICD 07/03/17. ?- Multiple episodes

## 2021-07-31 NOTE — Progress Notes (Signed)
[facility] EDUCATION NOTE:   ? ?Met with patient at bedside this morning. He reports he is "scared and overwhelmed" with all the information he has received over the last few days regarding his health. Reports his mother is unable to visit him today, but will come tomorrow.  ? ?Pt refused CT scan last night due to having to drink oral contrast. He is concerned about drinking with fluid restriction. This morning pt reports he is "not up to" completing scan today as he is too tired. Discussed with Dr Haroldine Laws. Will hold off on CT scan at this time and wait for creatinine to further improve.  ? ?Pt agreeable to complete PFTs today. Discussed need for urine drug screen to be completed today with bedside RN.  ?          ?Provided brief equipment overview and demonstration with HeartMate III training loop including discussion on the following:   ?a) mobile power unit ?b) system controller   ?c) universal Char[employer] fundraiser   ?d) battery clips   ?e) Batteries   ?f)  Perc lock   ?g) Percutaneous lead  ? ?Extended the option to have one of our current patients and caregiver(s) come to talk with them about living on support to assist with decision making. He will think about this and let VAD team know.  ?                                           ?The patient understands that from this discussion it does not mean that they will receive the device, but that depends on an extensive evaluation process. The patient is aware of the fact that if at anytime they want to stop the evaluation process they can. ? ?All questions have been answered at this time and contact information was provided should they encounter any further questions.  He is still agreeable at this time to the evaluation process and will move forward.  ? ? ?Emerson Monte RN ?VAD Coordinator  ?Office: [phone]  ?24/7 Pager: [phone]  ?

## 2021-07-31 NOTE — TOC Benefit Eligibility Note (Signed)
Patient Advocate Encounter ? ?Insurance verification completed.   ? ?The patient is currently admitted and upon discharge could be taking Corlanor 5 mg. ? ?The current 30 day co-pay is, $4.00.  ? ?The patient is insured through [facility] Medicaid  ? ? ? ?Roland Earl, CPhT ?Pharmacy Patient Advocate Specialist ?[facility] Patient Advocate Team ?Direct Number: [phone]  Fax: [phone] ? ? ? ? ? ?  ?

## 2021-07-31 NOTE — Progress Notes (Addendum)
Patient requesting clarification on oral contrast and fluid restriction. Notified CT. Will relay to day team and then CT. Scan must be done no more than 1 hour after completing oral contrast.  ? ?Update: Patient now refusing CT scan because he "does not want the heart pump." Informed patient that just because he is getting the scan, does not necessarily mean he will get the LVAD- it's just part of the workup. Will update day team when they get here for further discussions.  ?

## 2021-07-31 NOTE — Progress Notes (Signed)
ANTICOAGULATION CONSULT NOTE  ? ?Pharmacy Consult for heparin ?Indication:  LV thrombus  (history of LV thrombus and new acute LV thrombus) ? ?Allergies  ?Allergen Reactions  ? Celexa [Citalopram Hydrobromide] Other (See Comments)  ?  Makes the patient disoriented  ? Effexor [Venlafaxine] Other (See Comments)  ?  Altered patient's thinking process(confused)  ? Lisinopril Cough  ? ? ?Patient Measurements: ?Height: 5\' 9"  (175.3 cm) ?Weight: 93.1 kg (205 lb 4 oz) ?IBW/kg (Calculated) : 70.7 ?Heparin Dosing Weight: 91kg ? ?Vital Signs: ?Temp: 99.1 ?F (37.3 ?C) (05/02 0700) ?Temp Source: Core (05/02 0400) ?BP: 119/96 (05/02 0700) ?Pulse Rate: 121 (05/02 0700) ? ?Labs: ?Recent Labs  ?  07/29/21 ?0336 07/30/21 ?[id number] 07/30/21 ?[id number] 07/30/21 ?[id number] 07/30/21 ?1310 07/30/21 ?1326 07/30/21 ?1700 07/31/21 ?0439  ?HGB 13.8 13.8  --   --  14.3 14.6  14.3  --  13.5  ?HCT 38.3* 38.8*  --   --  42.0 43.0  42.0  --  39.9  ?PLT 376 367  --   --   --   --   --  356  ?APTT 83* >200*  --  98*  --   --   --  78*  ?HEPARINUNFRC 0.89*  --  >1.10* 0.93*  --   --   --  0.63  ?CREATININE 1.41* 1.59*  --   --   --   --  1.65* 1.47*  ? ? ? ?Estimated Creatinine Clearance: 67 mL/min (A) (by C-G formula based on SCr of 1.47 mg/dL (H)). ? ? ?Medical History: ?Past Medical History:  ?Diagnosis Date  ? AICD (automatic cardioverter/defibrillator) present 07/03/2017  ? Anxiety   ? CHF (congestive heart failure) ([place])   ? Essential hypertension   ? History of alcohol abuse   ? a. Sober since Feb 2016.  ? 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 Documented n [place]  ? NICM (nonischemic cardiomyopathy) ([place])   ? a. 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  ? ? ?Assessment: 51 year old male with history of mural thrombus on chronic apixaban, now with new acute LV  thrombus. New orders to change to IV heparin. CBC currently within normal limits.  ?Last apixaban dose was am 4/28 > heparin  started in afternoon for planned cath 5/1 ? ?Heparin level and aptt not correlating due to apixaban interference. Heparin level trending down at 0.63 at upper end of goal. Aptt at lower end of goal at 78s. CBC stable, no bleeding noted.  ? ? ? ?Goal of Therapy:  ?Heparin level 0.3-0.7 units/ml ?aPTT 66-102 seconds ?Monitor platelets by anticoagulation protocol: Yes ?  ?Plan:  ?Continue IV heparin at 1400 units/hr  ?Daily aPTT, heparin level and CBC. ? ?Erin Hearing PharmD., BCPS ?Clinical Pharmacist ?07/31/2021 9:05 AM ? ?

## 2021-07-31 NOTE — Progress Notes (Signed)
Initial Nutrition Assessment ? ?DOCUMENTATION CODES:  ? ?Not applicable ? ?INTERVENTION:  ? ?MVI with Minerals ? ?Trial of 30 ml ProSource Plus BID, each supplement provides 100 kcals and 15 grams protein.  ? ?Allow double portion of protein/main entree at meals ? ?NUTRITION DIAGNOSIS:  ? ?Increased nutrient needs related to chronic illness (advanced CHF, LVAD work-up) as evidenced by estimated needs. ? ?GOAL:  ? ?Patient will meet greater than or equal to 90% of their needs ? ?MONITOR:  ? ?PO intake, Labs, Weight trends ? ?REASON FOR ASSESSMENT:  ? ?Consult ?LVAD Eval ? ?ASSESSMENT:  ? ?51 yo male admitted with acute on chronic systolic CHF with cardiogenic shock, AKI on CKD with hyperkalemia on admission PMH includes CHF, PAD s/p bilateral TMA, OSA, Pt with hx of EtOH abuse but no EtOH since 2021 ? ?4/26 Admitted  ? ?ECHO this admission with severe Biventricular failure with EF 15% ? ?Noted pt is unsure if he wants VAD; planning to meet with existing VAD patient and palliative care ? ?Pt very sleepy on visit today ?Appetite good; recorded po intake mostly 100% of meals.  ?Pt reports at home he typically eats 3 meals per day. Pt reports he eats cereal with milk for breakfast but lunch and dinner varies; L/D are usually easy to prepare meals or frozen meals. Pt reports he does not use salt shaker or add salt to his food. Pt does not use any oral nutrition supplements at home or take any vitamins.  ? ?Pt denies any issues with chewing or swallowing; denies any taste changes or an issues from GI standpoint.  ? ?Pt reports he lives at home alone. Pt reports he prepares his own meals but his Mom picks him up and takes him grocery shopping. Pt reports he is disabled and does not work; pt reports he does not have a car.  ? 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 Pt reports his strength is good.  ? ?UBW: 190-195 pounds per pt report, pt reports dry weight has been stable ?Admit weight:  98.6 kg (217 pounds) ?Current wt: 93.1 kg (205 pounds) ?Weight loss fluid related with net negative status since admission; net negative 13 L per I/O flowsheet ? ?Labs: sodium 132 (L), Creatinine 1.47, BUN 24 ?Meds: lasix, milrinone, KCl ? ? ?NUTRITION - FOCUSED PHYSICAL EXAM: ? ?Flowsheet Row Most Recent Value  ?Orbital Region No depletion  ?Upper Arm Region No depletion  ?Thoracic and Lumbar Region No depletion  ?Temple Region No depletion  ?Clavicle Bone Region No depletion  ?Clavicle and Acromion Bone Region No depletion  ?Scapular Bone Region No depletion  ?Dorsal Hand No depletion  ?Patellar Region No depletion  ?Anterior Thigh Region No depletion  ?Posterior Calf Region No depletion  ?Edema (RD Assessment) Mild  ? ?  ? ? ?Diet Order:   ?Diet Order   ? ?       ?  Diet Heart Room service appropriate? Yes; Fluid consistency: Thin  Diet effective now       ?  ? ?  ?  ? ?  ? ? ?EDUCATION NEEDS:  ? ?Education needs have been addressed ? ?Skin:  Skin Assessment: Reviewed RN Assessment ? ?Last BM:  5/01 ? ?Height:  ? ?Ht Readings from Last 1 Encounters:  ?07/27/21 5\' 9"  (1.753 m)  ? ? ?Weight:  ? ?Wt Readings from Last 1 Encounters:  ?07/31/21 93.1 kg  ? ? ? ? ?  BMI:  Body mass index is 30.31 kg/m?. ? ?Estimated Nutritional Needs:  ? ?Kcal:  2200-2400 kcals ? ?Protein:  115-130 g ? ?Fluid:  1.8 L ? ?Romelle Starcher MS, RDN, LDN, CNSC ?Registered Dietitian III ?Clinical Nutrition ?RD Pager and On-Call Pager Number Located in [place]  ? ?

## 2021-07-31 NOTE — Progress Notes (Signed)
Pt taken to respiratory for PFT with this RN. VSS.  ?

## 2021-07-31 NOTE — Consult Note (Signed)
? ?                                                                                ?Consultation Note ?Date: 07/31/2021  ? ?Patient Name: Robert Booth  ?DOB: 1971-02-12  MRN: [id number]  Age / Sex: 51 y.o., male  ?PCP: Charlott Rakes, MD ?Referring Physician: Jolaine Artist, MD ? ?Reason for Consultation: Establishing goals of care and Psychosocial/spiritual support ? ?HPI/Patient Profile: 51 y.o. male   admitted on 07/26/2021 with  ?HFrEF, NICM, [employer] ICD, ETOH, NSVT, gout, LV mural thrombus, and CKD Stage IIIa. 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  ?  ?Over the last few years EF was down to 15% and had improved to 35-40% back in 2016. He stopped taking his medications and EF went back down to 15%.  ?  ?He was seen in the HF clinic 07/05/21. Multiple runs of sustained VT on device check. Mexiletine increased to 300 mg BID. BNP significantly increased (42, >3,000 in 2 months). Scr above baseline at 2.3. Furosemide increased to 40 mg BID.  ? ?Presented to HF clinic for routine f/u. Complaining of fatigue, dyspnea at rest and hemoptysis. Heart Logic Score 76. Admitted from HF clinic with suspected lowput  HF and volume overload. K 6.1, lactic acid 2.9, creatinine 2.3, AST 93 ALT 205, and WBC 15.  ?  ?ICD interrogation: Heart Logic Index 76 (started trending up first week of April), thoracic impedance down, activity level 1.2 hrs a day, one 13 second run NSVT since last device check. ? ?Today is day 4 of this hospitalization ? ? ?Patient faces treatment option decisions (to include advanced therapies/this LVAD), advanced directive decisions and anticipatory care needs. ? ?  ?Clinical Assessment and Goals of Care: ? ?This NP Wadie Lessen reviewed medical records, received report from team, assessed the patient and then meet at the patient's bedside  to discuss diagnosis, prognosis, GOC, and options. ?  ?Concept of Palliative Care was introduced as specialized medical care for people  and their families living with serious illness.  If focuses on providing relief from the symptoms and stress of a serious illness.  The goal is to improve quality of life for both the patient and the family. ? ?Values and goals of care important to patient and family were attempted to be elicited. ? ?Created space and opportunity for patient to explore thoughts and feelings regarding current medical situation. ?Patient had the opportunity today to meet with a previous LVAD patient, currently living in the community successfully with his LVAD. ? ?He speaks to an understanding of the seriousness of his current medical situation however he clearly verbalizes that he is unsure about the next steps to take. ? ?He tells me that his mother and sister are his main support persons.  He currently is living in a hotel while program is attempting to secure housing for him.  He speaks to his psychiatric conditions to include depression and anxiety,  he is treated and receives his medicine through [facility].  He tells me he is currently on Zoloft and Seroquel.       ? ?I raised awareness to the Pharmacy that  the  patient is currently not on home dose of  Seroquel ?  ?  ?Education offered today regarding advanced directives.  Concepts specific to code status, artifical feeding and hydration, continued IV antibiotics and rehospitalization was had.   ? ?  ?Education offered today regarding advanced directives.  Concepts specific to code status, artifical feeding and hydration, continued IV antibiotics and rehospitalization was had.    ?  ? ? ?Education offered on the importance of continued conversation with his family and the medical providers regarding overall plan of care and treatment options,  ensuring decisions are within the context of the patients values and GOCs.  ?  ?  ?  ? ?No documented healthcare power of attorney or advanced care planning documents in place.  ? ? Patient tells me that in the event that he cannot make  medical to pole decisions for himself he would ask that his mother Robert Booth be his decision maker at that time. ? ?Encourage patient to utilize the support of the spiritual care department to document H POA and advanced directive documents while here in the hospital.  I left a blue book at the bedside. ? ?I called his mother with patient's permission and left a message.  Hopefully we will be able to secure a time for palliative to meet with both patient and his family. ?  ? ?  ? ?SUMMARY OF RECOMMENDATIONS   ? ?Code Status/Advance Care Planning: ?Full code ? ? ?Additional Recommendations (Limitations, Scope, Preferences): ?Full Scope Treatment ? ?Psycho-social/Spiritual:  ?Desire for further Chaplaincy support:no ?Additional Recommendations: Emotional support offered ? ?Prognosis:  ?Unable to determine ? ?Discharge Planning: To Be Determined  ? ?  ? ?Primary Diagnoses: ?Present on Admission: ? Acute on chronic systolic CHF (congestive heart failure) ([place]) ? LV (left ventricular) mural thrombus without MI ([place]) ? Elevated LFTs ? H/O ETOH abuse ? ? ?I have reviewed the medical record, interviewed the patient and family, and examined the patient. The following aspects are pertinent. ? ?Past Medical History:  ?Diagnosis Date  ? AICD (automatic cardioverter/defibrillator) present 07/03/2017  ? Anxiety   ? CHF (congestive heart failure) ([place])   ? Essential hypertension   ? History of alcohol abuse   ? a. Sober since Feb 2016.  ? 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 Documented n [place]  ? NICM (nonischemic cardiomyopathy) ([place])   ? a. 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  ? ?Social History  ? ?Socioeconomic History  ? Marital status: Single  ?  Spouse name: Not on file  ? Number of children: 6  ? Years of education: Not on  file  ? Highest education level: Not on file  ?Occupational History  ? Occupation: Unemployed  ?Tobacco Use  ? Smoking status: Former  ?  Types: Cigarettes  ? Smokeless tobacco: Former  ?  Types: Chew  ? Tobacco comments:  ?  Was a rare smoker  ?Vaping Use  ? Vaping Use: Never used  ?Substance and Sexual Activity  ? Alcohol use: No  ?  Alcohol/week: 0.0 standard drinks  ? Drug use: No  ? Sexual activity: Yes  ?Other Topics Concern  ? Not on file  ?Social History Narrative  ? Lives with mom.    ? ?Social Determinants of Health  ? ?Financial Resource Strain: Not on file  ?  Food Insecurity: Not on file  ?Transportation Needs: No Transportation Needs  ? Lack of Transportation (Medical): No  ? Lack of Transportation (Non-Medical): No  ?Physical Activity: Not on file  ?Stress: Not on file  ?Social Connections: Not on file  ? ?Family History  ?Problem Relation Age of Onset  ? Hypertension Mother   ? Hypertension Father   ? Heart attack Father 7  ?     died  ? Hypertension Sister   ? Heart attack Paternal Grandfather 60  ?     died  ? ?Scheduled Meds: ? atorvastatin  20 mg Oral Daily  ? Chlorhexidine Gluconate Cloth  6 each Topical Daily  ? digoxin  0.125 mg Oral Daily  ? furosemide  80 mg Intravenous BID  ? gabapentin  600 mg Oral BID  ? iohexol      ? mexiletine  300 mg Oral BID  ? pantoprazole  40 mg Oral Daily  ? potassium chloride  40 mEq Oral BID  ? sertraline  200 mg Oral Daily  ? sodium chloride flush  10-40 mL Intracatheter Q12H  ? spironolactone  12.5 mg Oral Daily  ? ?Continuous Infusions: ? sodium chloride    ? sodium chloride 10 mL/hr at 07/30/21 2224  ? sodium chloride 10 mL/hr at 07/31/21 1000  ? heparin 1,400 Units/hr (07/31/21 1000)  ? milrinone 0.25 mcg/kg/min (07/31/21 1000)  ? ?PRN Meds:.sodium chloride, acetaminophen, guaiFENesin-dextromethorphan, menthol-cetylpyridinium, ondansetron (ZOFRAN) IV, sodium chloride flush ?Medications Prior to Admission:  ?Prior to Admission medications   ?Medication Sig  Start Date End Date Taking? Authorizing Provider  ?allopurinol (ZYLOPRIM) 300 MG tablet TAKE 1 TABLET (300 MG TOTAL) BY MOUTH DAILY. 05/07/21  Yes Mil[place], Maricela Bo, FNP  ?apixaban (ELIQUIS) 5 MG TABS tablet

## 2021-08-01 ENCOUNTER — Inpatient Hospital Stay (HOSPITAL_COMMUNITY): Payer: Medicaid Other

## 2021-08-01 DIAGNOSIS — I5023 Acute on chronic systolic (congestive) heart failure: Secondary | ICD-10-CM | POA: Diagnosis not present

## 2021-08-01 LAB — CBC WITH DIFFERENTIAL/PLATELET
Abs Immature Granulocytes: 0.08 10*3/uL — ABNORMAL HIGH (ref 0.00–0.07)
Basophils Absolute: 0.1 10*3/uL (ref 0.0–0.1)
Basophils Relative: 0 %
Eosinophils Absolute: 0.1 10*3/uL (ref 0.0–0.5)
Eosinophils Relative: 1 %
HCT: 38.5 % — ABNORMAL LOW (ref 39.0–52.0)
Hemoglobin: 13.6 g/dL (ref 13.0–17.0)
Immature Granulocytes: 1 %
Lymphocytes Relative: 14 %
Lymphs Abs: 1.8 10*3/uL (ref 0.7–4.0)
MCH: 31.7 pg (ref 26.0–34.0)
MCHC: 35.3 g/dL (ref 30.0–36.0)
MCV: 89.7 fL (ref 80.0–100.0)
Monocytes Absolute: 1.3 10*3/uL — ABNORMAL HIGH (ref 0.1–1.0)
Monocytes Relative: 10 %
Neutro Abs: 9.4 10*3/uL — ABNORMAL HIGH (ref 1.7–7.7)
Neutrophils Relative %: 74 %
Platelets: 318 10*3/uL (ref 150–400)
RBC: 4.29 MIL/uL (ref 4.22–5.81)
RDW: 15.9 % — ABNORMAL HIGH (ref 11.5–15.5)
WBC: 12.8 10*3/uL — ABNORMAL HIGH (ref 4.0–10.5)
nRBC: 0 % (ref 0.0–0.2)

## 2021-08-01 LAB — PREALBUMIN: Prealbumin: 17.6 mg/dL — ABNORMAL LOW (ref 18–38)

## 2021-08-01 LAB — BASIC METABOLIC PANEL
Anion gap: 11 (ref 5–15)
BUN: 28 mg/dL — ABNORMAL HIGH (ref 6–20)
CO2: 30 mmol/L (ref 22–32)
Calcium: 9.5 mg/dL (ref 8.9–10.3)
Chloride: 95 mmol/L — ABNORMAL LOW (ref 98–111)
Creatinine, Ser: 1.63 mg/dL — ABNORMAL HIGH (ref 0.61–1.24)
GFR, Estimated: 51 mL/min — ABNORMAL LOW (ref >60)
Glucose, Bld: 98 mg/dL (ref 70–99)
Potassium: 4.5 mmol/L (ref 3.5–5.1)
Sodium: 136 mmol/L (ref 135–145)

## 2021-08-01 LAB — T4, FREE: Free T4: 0.89 ng/dL (ref 0.61–1.12)

## 2021-08-01 LAB — APTT: aPTT: 74 seconds — ABNORMAL HIGH (ref 24–36)

## 2021-08-01 LAB — COOXEMETRY PANEL
Carboxyhemoglobin: 2.1 % — ABNORMAL HIGH (ref 0.5–1.5)
Methemoglobin: <0.7 % (ref 0.0–1.5)
O2 Saturation: 52.4 %
Total hemoglobin: 13.8 g/dL (ref 12.0–16.0)

## 2021-08-01 LAB — HEPARIN LEVEL (UNFRACTIONATED): Heparin Unfractionated: 0.83 IU/mL — ABNORMAL HIGH (ref 0.30–0.70)

## 2021-08-01 LAB — HEPATITIS B SURFACE ANTIGEN: Hepatitis B Surface Ag: NONREACTIVE

## 2021-08-01 LAB — MAGNESIUM: Magnesium: 2 mg/dL (ref 1.7–2.4)

## 2021-08-01 LAB — PSA: Prostatic Specific Antigen: 0.6 ng/mL (ref 0.00–4.00)

## 2021-08-01 MED FILL — Verapamil HCl IV Soln 2.5 MG/ML: INTRAVENOUS | Qty: 2 | Status: AC

## 2021-08-01 NOTE — Progress Notes (Signed)
VAD coordinator and SW-Jenna Uris met with pt today regarding VAD. Pt continues to refuse all testing/imaging for VAD evaluation. We had a lengthy conversation with the pt today. Pt states "I haven't decided if I want the pump." VAD coordinator explained that just because he has the testing doesn't mean that he will get a VAD. Pt tells me he refused the CT because "I don't like to feel full, drinking that stuff will make me feel full." Pt informed that if we don't finish the evaluation he won't be able to get the VAD. Pts response to this is "if I wait to late, then I waited too late." We will pause the pts evaluation process at this time per his request. VAD coordinator updated Dr Haroldine Laws. ? ?Tanda Rockers RN, BSN ?VAD Coordinator ?24/7 Pager [phone] ? ?

## 2021-08-01 NOTE — Progress Notes (Signed)
Pt refusing ABI at this time. "I still haven't decided if I am going to do it (LVAD)." ?

## 2021-08-01 NOTE — Progress Notes (Signed)
? ? Advanced Heart Failure Rounding Note ? ?PCP-Cardiologist: Dr. Gala Romney  ? ?Subjective:   ? ?4/26: Admitted w/ CS + marked volume overload. Echo severe BiV failure EF 15% + large LV thrombus. Placed on milrinone and diuresed w/ IV Lasix.  ?5/1: Cath with normal coronary arteries. Low output (CI 1.4) and high filling pressures (PCWP 30). Moved back to ICU. Milrinone increased. IV Lasix restarted. VAD w/u initiated.  ? ?Today, on Milrinone 0.25. Co-ox back 60% -> 52% ? ?Ernestine Conrad numbers done personally.  ? ?CVP 11 ?PA 61/39 (47) ?PCW unable to wedge ?Fick 2.8/1.4 ?SVR 2373 ?PAPi 2.0 ? ?Scr stable 1.6 ? ?Feels fine. Denies CP, SOB, orthopnea or PND.  ? ? ? R/LHC 5/1 ? ?On milrinone 0.125 mcg/kg/min ?  ?Ao = 118/97 (105) ?RA = 13  ?RV = 64/13 (19) ?PA = 55/47 (50) ?PCW = 30 ?Fick cardiac output/index = 3.16/1.51 ?Thermo CO/CI = 3.19/1.52 ?PVR = 6.1 WU ?SVR = 2300 ?PAPi = 0.6 ?Ao sat = 96% ?PA sat = 46%, 46% ? ?Normal coronaries  ? ? ?Objective:   ?Weight Range: ?92.3 kg ?Body mass index is 30.05 kg/m?.  ? ?Vital Signs:   ?Temp:  [98.1 ?F (36.7 ?C)-99.3 ?F (37.4 ?C)] 98.6 ?F (37 ?C) (05/03 0846) ?Pulse Rate:  [116-145] 123 (05/03 0846) ?Resp:  [15-21] 19 (05/03 0846) ?BP: (99-129)/(70-105) 114/86 (05/03 0800) ?SpO2:  [85 %-96 %] 94 % (05/03 0846) ?Weight:  [92.3 kg] 92.3 kg (05/03 0500) ?Last BM Date : 07/30/21 ? ?Weight change: ?Filed Weights  ? 07/30/21 1405 07/31/21 0500 08/01/21 0500  ?Weight: 94 kg 93.1 kg 92.3 kg  ? ? ?Intake/Output:  ? ?Intake/Output Summary (Last 24 hours) at 08/01/2021 0904 ?Last data filed at 08/01/2021 0800 ?Gross per 24 hour  ?Intake 1014.27 ml  ?Output 2870 ml  ?Net -1855.73 ml  ? ? ? ?Physical Exam  ?  ?General:  Lying in bed ?HEENT: normal ?Neck: supple. RIJ swan  Carotids 2+ bilat; no bruits. No lymphadenopathy or thryomegaly appreciated. ?Cor: PMI nondisplaced. Regular tachy  ?Lungs: clear ?Abdomen: soft, nontender, nondistended. No hepatosplenomegaly. No bruits or masses. Good bowel  sounds. ?Extremities: no cyanosis, clubbing, rash, tr edema ?Neuro: alert & orientedx3, cranial nerves grossly intact. moves all 4 extremities w/o difficulty. Affect pleasant ? ? ?Telemetry  ? ?Sinus tach 110-130s Personally reviewed ? ? ?Labs  ?  ?CBC ?Recent Labs  ?  07/31/21 ?0439 08/01/21 ?0410  ?WBC 12.2* 12.8*  ?NEUTROABS 9.5* 9.4*  ?HGB 13.5 13.6  ?HCT 39.9 38.5*  ?MCV 90.9 89.7  ?PLT 356 318  ? ? ?Basic Metabolic Panel ?Recent Labs  ?  07/31/21 ?0439 08/01/21 ?0410  ?NA 132* 136  ?K 4.4 4.5  ?CL 94* 95*  ?CO2 29 30  ?GLUCOSE 150* 98  ?BUN 24* 28*  ?CREATININE 1.47* 1.63*  ?CALCIUM 8.9 9.5  ?MG 2.2 2.0  ? ? ?Liver Function Tests ?Recent Labs  ?  07/30/21 ?1700  ?AST 102*  ?ALT 104*  ?ALKPHOS 97  ?BILITOT 1.2  ?PROT 6.9  ?ALBUMIN 2.8*  ? ? ?No results for input(s): LIPASE, AMYLASE in the last 72 hours. ?Cardiac Enzymes ?No results for input(s): CKTOTAL, CKMB, CKMBINDEX, TROPONINI in the last 72 hours. ? ?BNP: ?BNP (last 3 results) ?Recent Labs  ?  04/06/21 ?1121 07/05/21 ?0600 07/26/21 ?1243  ?BNP 42.4 3,398.7* 1,528.0*  ? ? ? ?ProBNP (last 3 results) ?No results for input(s): PROBNP in the last 8760 hours. ? ? ?D-Dimer ?No results for input(s): DDIMER  in the last 72 hours. ?Hemoglobin A1C ?No results for input(s): HGBA1C in the last 72 hours. ? ?Fasting Lipid Panel ?No results for input(s): CHOL, HDL, LDLCALC, TRIG, CHOLHDL, LDLDIRECT in the last 72 hours. ? ?Thyroid Function Tests ?No results for input(s): TSH, T4TOTAL, T3FREE, THYROIDAB in the last 72 hours. ? ?Invalid input(s): FREET3 ? ?Other results: ? ? ?Imaging  ? ? ?No results found. ? ? ?Medications:   ? ? ?Scheduled Medications: ? (feeding supplement) PROSource Plus  30 mL Oral BID BM  ? atorvastatin  20 mg Oral Daily  ? Chlorhexidine Gluconate Cloth  6 each Topical Daily  ? digoxin  0.125 mg Oral Daily  ? furosemide  80 mg Intravenous BID  ? gabapentin  600 mg Oral BID  ? mexiletine  300 mg Oral BID  ? multivitamin with minerals  1 tablet Oral  Daily  ? pantoprazole  40 mg Oral Daily  ? potassium chloride  40 mEq Oral BID  ? QUEtiapine  100 mg Oral QHS  ? sertraline  200 mg Oral Daily  ? sodium chloride flush  10-40 mL Intracatheter Q12H  ? spironolactone  12.5 mg Oral Daily  ? ? ?Infusions: ? sodium chloride    ? sodium chloride 10 mL/hr at 07/30/21 2224  ? sodium chloride 10 mL/hr at 08/01/21 0800  ? heparin 1,400 Units/hr (08/01/21 0831)  ? milrinone 0.375 mcg/kg/min (08/01/21 0854)  ? ? ?PRN Medications: ?sodium chloride, acetaminophen, guaiFENesin-dextromethorphan, menthol-cetylpyridinium, ondansetron (ZOFRAN) IV, sodium chloride flush ? ? ? ?Patient Profile  ? ?51 y/o male with severe systolic HF due to NICM. EF 15-20%. Presented to clinic with low output symptoms and marked fluid overload. Not responding to oral diuretics.  ?  ?ICD interrogated. No VT but HL score 77.  ?  ?Admitted to ICU. Lactate 2.9. Echo w/ large LV thrombus, EF 15%, RV mod reduced, severe TR   ? ?Assessment/Plan  ? ?1. Acute on chronic systolic HF >>Cardiogenic Shock  ?- NICM. [employer] ICD ?- Cath ok performed [place] 2016. 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  ?- Echo 10/2016 EF 10-15%. LV dysfunction after stopping HF meds. ?- Echo 03/01/21 EF 20-25% RV moderately reduced ?- cMRI 2/19 EF 19%. RV mildly decreased .  ?- CPX 11/20 with mild functional limitation; no clear HF limitation despite EF<20% ?- now admitted with cardiogenic shock and marked fluid overload. Initial lactate 2.9. Started on Milrinone ?- Echo 4/23 EF 15%, + LV thrombus, RV mod reduced, severe TR  ?- R/LHC on 0.125 of milrinone w/ low output (CI 1.4) and high filling pressers, normal cors. Milrinone increased back to 0.25 ?- Remains low output and tachycardic  ?- Will increase milrinone to 0.375 ?- Continue IV Lasix 80 mg bid ?- Off GDMT due to AKI/Hyperkalemia/shock.  ?- Continue digoxin and spiro today ?- VAD team engaged. He remains hesitant about advanced therapies. PAPI improved with milrinone but still  marginal. May not qualify for VAD unless indices improve with LV unloading. Likely won't be transplant candidate given social issues and PAD.  ? ?  ?2. LV mural thrombus  ?- He has been off warfarin ?- No thrombus on echo 12/22.  ?- Echo this admit w/ LV thrombus.  ?- Off Eliquis -> heparin gtt until procedures completed  ?- Continue heparin ?- No obvious bleeding ?  ?3. H/o ETOH abuse ?- previously heavy drinker . No ETOH since 2021.  ?  ?4. VT 07/08/17  ?- He is s/p [employer] ICD 07/03/17. ?- Multiple episodes  sustained VT on device check 06/621. Mexiletine increased to 300 mg BID. Just one episode NSVT since last check.  ?- Continue mexiletine at current dose. ?- K 4.5 today Keep K > 4.0 and Mg > 2.0  ? ?5. Gout ?- On allopurinol 300 mg daily.  ?- ? If acute gout flare contributing to foot pain ?- uric acid 7.6 ?  ?6. 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 ?- Continues to struggle with balance/mobility.  ?  ?7. Anxiety/Depression ?- Follows with [facility]. ?  ?8. OSA ?- Struggling with adjusting to CPAP ?  ?9. Hemoptysis ?- Hgb stable 13.5  ?- likely 2/2 pulmonary edema -> resolved with diuresis ?- CXR ok ?  ?10. AKI on CKD:  ?-Creatinine baseline previously 1.2-1.4  ?-Creatinine 2.34>>> 1.6 now stable ?-Likely Cardiorenal  ?-Continue to support output w/ milrinone ?-follow BMP w/ diuresis  ?  ?11. Hyperkalemia  ?- K 6.1 on admit resolved w/ Lokelma ? ?12. Tachycardia ?- sinus tach  ? ?CRITICAL CARE ?Performed by: Glori Bickers ? ?Total critical care time: 45 minutes ? ?Critical care time was exclusive of separately billable procedures and treating other patients. ? ?Critical care was necessary to treat or prevent imminent or life-threatening deterioration. ? ?Critical care was time spent personally by me (independent of midlevel providers or residents) on the following activities: development of treatment plan with patient and/or surrogate as well as nursing,  discussions with consultants, evaluation of patient's response to treatment, examination of patient, obtaining history from patient or surrogate, ordering and performing treatments and interventions, ordering and review o

## 2021-08-01 NOTE — Progress Notes (Signed)
ANTICOAGULATION CONSULT NOTE  ? ?Pharmacy Consult for heparin ?Indication:  LV thrombus  (history of LV thrombus and new acute LV thrombus) ? ?Allergies  ?Allergen Reactions  ? Celexa [Citalopram Hydrobromide] Other (See Comments)  ?  Makes the patient disoriented  ? Effexor [Venlafaxine] Other (See Comments)  ?  Altered patient's thinking process(confused)  ? Lisinopril Cough  ? ? ?Patient Measurements: ?Height: 5\' 9"  (175.3 cm) ?Weight: 92.3 kg (203 lb 7.8 oz) ?IBW/kg (Calculated) : 70.7 ?Heparin Dosing Weight: 91kg ? ?Vital Signs: ?Temp: 99.7 ?F (37.6 ?C) (05/03 1200) ?Temp Source: Core (05/03 1200) ?BP: 117/95 (05/03 1200) ?Pulse Rate: 137 (05/03 1200) ? ?Labs: ?Recent Labs  ?  07/30/21 ?[id number] 07/30/21 ?[id number] 07/30/21 ?[id number] 07/30/21 ?1310 07/30/21 ?1326 07/30/21 ?1700 07/31/21 ?0439 08/01/21 ?0410  ?HGB 13.8  --   --    < > 14.6  14.3  --  13.5 13.6  ?HCT 38.8*  --   --    < > 43.0  42.0  --  39.9 38.5*  ?PLT 367  --   --   --   --   --  356 318  ?APTT >200*  --  98*  --   --   --  78* 74*  ?HEPARINUNFRC  --    < > 0.93*  --   --   --  0.63 0.83*  ?CREATININE 1.59*  --   --   --   --  1.65* 1.47* 1.63*  ? < > = values in this interval not displayed.  ? ? ? ?Estimated Creatinine Clearance: 60.1 mL/min (A) (by C-G formula based on SCr of 1.63 mg/dL (H)). ? ? ?Medical History: ?Past Medical History:  ?Diagnosis Date  ? AICD (automatic cardioverter/defibrillator) present 07/03/2017  ? Anxiety   ? CHF (congestive heart failure) ([place])   ? Essential hypertension   ? History of alcohol abuse   ? a. Sober since Feb 2016.  ? 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 Documented n [place]  ? NICM (nonischemic cardiomyopathy) ([place])   ? a. 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  ? ? ?Assessment: 51 year old male with history of mural  thrombus on chronic apixaban, now with new acute LV thrombus. New orders to change to IV heparin. CBC currently within normal limits.  ?Last apixaban dose was am 4/28 > heparin  started in afternoon for planned cath 5/1 ? ?Heparin level and aptt not correlating due to apixaban interference. Aptt at lower end of goal at 74s. CBC stable, no bleeding noted.  ? ? ? ?Goal of Therapy:  ?Heparin level 0.3-0.7 units/ml ?aPTT 66-102 seconds ?Monitor platelets by anticoagulation protocol: Yes ?  ?Plan:  ?Continue IV heparin at 1400 units/hr  ?Daily aPTT, heparin level and CBC. ? ?Erin Hearing PharmD., BCPS ?Clinical Pharmacist ?08/01/2021 2:27 PM ? ?

## 2021-08-01 NOTE — Progress Notes (Signed)
Pre-VAD ultrasound study attempted. Patient in chair, eating. VAD plan pending. RN to call department for examination as required.  ? ?Jean Rosenthal, RDMS, RVT ? ?

## 2021-08-01 NOTE — Progress Notes (Addendum)
Outpatient HF CSW met with pt to discuss LVAD psychosocial assessment and discuss who he would like to be considered for his caregiver support if he is approved for LVAD. ? ?Pt reports his mom and his sister Robert Booth would be the best people to be his caregivers.   ? ?Pt is currently living in a hotel provided through a housing program through [place]- states that they referred him to a housing program for those with mental health concerns and they are in the process of building him a house.   ? ?Prior to being in this hotel program he was living with his sister, Robert Booth, who works from home- reports she has been agreeable to him living with her again so thinks she would be willing for him to move in following surgery. ? ?Pt mom also lives locally in an independent living facility- he would be allowed to stay there but limit is 2 weeks for visitors. ? ?Pt provides permission for CSW to speak with his mom and sister and discuss LVAD- he has not informed him of the possibility to get LVAD surgery at this time and is comfortable with CSW explaining this to them ? ?CSW called pt mom and sister and they are planning to come to hospital tomorrow at 3pm to discuss caregiver requirements post LVAD surgery. ? ?Robert Ny, LCSW ?Clinical Social Worker ?[facility] ?Desk#: [phone] ?Cell#: [phone] ? ?

## 2021-08-01 NOTE — Progress Notes (Signed)
Outpatient HF CSW met with pt alongside LVAD coordinator to check in regarding pt current motivation to continue with LVAD assessment. ? ?Pt has refused multiple tests required to assess for LVAD.  States he did this because he is unsure if he wants LVAD and because some of the tests sounded unpleasant.  Explained that tests are necessary to see if pt is even a candidate for surgery and do not commit him to getting the surgery.  Discussed that pt will be required to do a lot of things and keep up with a lot of appts if he goes through with surgery so refusal to do what is necessary in the hospital presents a concern if he would be willing to keep up with necessary requirements post surgery.  Pt expressed understanding but is not interested in doing all the required testing at this time.   ? ?Pt had mentioned struggles with depression so CSW inquired how he was feeling currently.  Pt reports that he is not feeling depressed currently just overwhelmed.  CSW offered further support with mental health concerns but pt denied need at this time. ? ?Pt agreeable to cancel doing psychosocial assessment with pt and with family until he can further consider proceeding.  CSW informed family of this decision. ? ?CSW available to meet with pt if he and medical team decide to proceed with LVAD assessment ? ?Jorge Ny, LCSW ?Clinical Social Worker ?[facility] ?Desk#: [phone] ?Cell#: [phone] ? ? ? ?

## 2021-08-02 ENCOUNTER — Other Ambulatory Visit (HOSPITAL_COMMUNITY): Payer: Medicaid Other

## 2021-08-02 ENCOUNTER — Encounter (HOSPITAL_COMMUNITY): Payer: Medicaid Other

## 2021-08-02 DIAGNOSIS — I428 Other cardiomyopathies: Secondary | ICD-10-CM | POA: Diagnosis not present

## 2021-08-02 DIAGNOSIS — I240 Acute coronary thrombosis not resulting in myocardial infarction: Secondary | ICD-10-CM

## 2021-08-02 DIAGNOSIS — Z515 Encounter for palliative care: Secondary | ICD-10-CM

## 2021-08-02 DIAGNOSIS — F1011 Alcohol abuse, in remission: Secondary | ICD-10-CM

## 2021-08-02 DIAGNOSIS — I5023 Acute on chronic systolic (congestive) heart failure: Secondary | ICD-10-CM | POA: Diagnosis not present

## 2021-08-02 LAB — COOXEMETRY PANEL
Carboxyhemoglobin: 1.5 % (ref 0.5–1.5)
Methemoglobin: <0.7 % (ref 0.0–1.5)
O2 Saturation: 53.6 %
Total hemoglobin: 13.1 g/dL (ref 12.0–16.0)

## 2021-08-02 LAB — CBC WITH DIFFERENTIAL/PLATELET
Abs Immature Granulocytes: 0.08 10*3/uL — ABNORMAL HIGH (ref 0.00–0.07)
Basophils Absolute: 0.1 10*3/uL (ref 0.0–0.1)
Basophils Relative: 1 %
Eosinophils Absolute: 0.2 10*3/uL (ref 0.0–0.5)
Eosinophils Relative: 2 %
HCT: 37.2 % — ABNORMAL LOW (ref 39.0–52.0)
Hemoglobin: 13 g/dL (ref 13.0–17.0)
Immature Granulocytes: 1 %
Lymphocytes Relative: 17 %
Lymphs Abs: 1.9 10*3/uL (ref 0.7–4.0)
MCH: 31.5 pg (ref 26.0–34.0)
MCHC: 34.9 g/dL (ref 30.0–36.0)
MCV: 90.1 fL (ref 80.0–100.0)
Monocytes Absolute: 1.1 10*3/uL — ABNORMAL HIGH (ref 0.1–1.0)
Monocytes Relative: 10 %
Neutro Abs: 7.9 10*3/uL — ABNORMAL HIGH (ref 1.7–7.7)
Neutrophils Relative %: 69 %
Platelets: 270 10*3/uL (ref 150–400)
RBC: 4.13 MIL/uL — ABNORMAL LOW (ref 4.22–5.81)
RDW: 15.9 % — ABNORMAL HIGH (ref 11.5–15.5)
WBC: 11.3 10*3/uL — ABNORMAL HIGH (ref 4.0–10.5)
nRBC: 0 % (ref 0.0–0.2)

## 2021-08-02 LAB — CULTURE, BLOOD (ROUTINE X 2)
Culture: NO GROWTH
Culture: NO GROWTH
Special Requests: ADEQUATE
Special Requests: ADEQUATE

## 2021-08-02 LAB — BASIC METABOLIC PANEL
Anion gap: 11 (ref 5–15)
BUN: 37 mg/dL — ABNORMAL HIGH (ref 6–20)
CO2: 29 mmol/L (ref 22–32)
Calcium: 9.2 mg/dL (ref 8.9–10.3)
Chloride: 96 mmol/L — ABNORMAL LOW (ref 98–111)
Creatinine, Ser: 1.78 mg/dL — ABNORMAL HIGH (ref 0.61–1.24)
GFR, Estimated: 46 mL/min — ABNORMAL LOW (ref >60)
Glucose, Bld: 82 mg/dL (ref 70–99)
Potassium: 4.3 mmol/L (ref 3.5–5.1)
Sodium: 136 mmol/L (ref 135–145)

## 2021-08-02 LAB — APTT: aPTT: 79 seconds — ABNORMAL HIGH (ref 24–36)

## 2021-08-02 LAB — MAGNESIUM: Magnesium: 2.1 mg/dL (ref 1.7–2.4)

## 2021-08-02 LAB — HEPARIN LEVEL (UNFRACTIONATED): Heparin Unfractionated: 0.85 IU/mL — ABNORMAL HIGH (ref 0.30–0.70)

## 2021-08-02 NOTE — Progress Notes (Signed)
ANTICOAGULATION CONSULT NOTE  ? ?Pharmacy Consult for heparin ?Indication:  LV thrombus  (history of LV thrombus and new acute LV thrombus) ? ?Allergies  ?Allergen Reactions  ? Celexa [Citalopram Hydrobromide] Other (See Comments)  ?  Makes the patient disoriented  ? Effexor [Venlafaxine] Other (See Comments)  ?  Altered patient's thinking process(confused)  ? Lisinopril Cough  ? ? ?Patient Measurements: ?Height: 5\' 9"  (175.3 cm) ?Weight: 92.7 kg (204 lb 5.9 oz) ?IBW/kg (Calculated) : 70.7 ?Heparin Dosing Weight: 91kg ? ?Vital Signs: ?Temp: 98.4 ?F (36.9 ?C) (05/04 0700) ?Temp Source: Core (Comment) (05/04 0000) ?BP: 110/91 (05/04 0900) ? ?Labs: ?Recent Labs  ?  07/31/21 ?0439 08/01/21 ?0410 08/02/21 ?0438  ?HGB 13.5 13.6 13.0  ?HCT 39.9 38.5* 37.2*  ?PLT 356 318 270  ?APTT 78* 74* 79*  ?HEPARINUNFRC 0.63 0.83* 0.85*  ?CREATININE 1.47* 1.63* 1.78*  ? ? ? ?Estimated Creatinine Clearance: 55.2 mL/min (A) (by C-G formula based on SCr of 1.78 mg/dL (H)). ? ? ?Medical History: ?Past Medical History:  ?Diagnosis Date  ? AICD (automatic cardioverter/defibrillator) present 07/03/2017  ? Anxiety   ? CHF (congestive heart failure) (HCC)   ? Essential hypertension   ? History of alcohol abuse   ? a. Sober since Feb 2016.  ? 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 Documented n 07/2014  ? NICM (nonischemic cardiomyopathy) (HCC)   ? a. 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  ? ? ?Assessment: 51 year old male with history of mural thrombus on chronic apixaban, now with new acute LV thrombus. New orders to change to IV heparin. CBC currently within normal limits.  ?Last apixaban dose was am 4/28 > heparin  started in afternoon for planned cath 5/1 ? ?Heparin level and aptt not correlating due to apixaban interference. aPTT is therapeutic, CBC  stable. ? ? ?Goal of Therapy:  ?Heparin level 0.3-0.7 units/ml ?aPTT 66-102 seconds ?Monitor platelets by anticoagulation protocol: Yes ?  ?Plan:  ?Continue IV heparin at 1400 units/hr  ?Daily aPTT, heparin level and CBC. ? ?5/28, PharmD, BCPS, BCCP ?Clinical Pharmacist ?[phone] ?Please check AMION for all [facility] Pharm[facility] numbers ?08/02/2021 ? ?

## 2021-08-02 NOTE — Progress Notes (Signed)
Patient ID: Robert Booth, male   DOB: Oct 09, 1970, 51 y.o.   MRN: [id number] ? ? ? ?Progress Note from the Palliative Medicine Team at [facility] ? ? ?Patient Name: Robert Booth        ?Date: 08/02/2021 ?DOB: 19-Mar-1971  Age: 51 y.o. MRN#: [id number] ?Attending Physician: Jolaine Artist, MD ?Primary Care Physician: Charlott Rakes, MD ?Admit Date: 07/26/2021 ? ? ?Medical records reviewed  ? ?51 y.o. male   admitted on 07/26/2021 with  ?HFrEF, NICM, [employer] ICD, ETOH, NSVT, gout, LV mural thrombus, and CKD Stage IIIa. 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  ?  ?Over the last few years EF was down to 15% and had improved to 35-40% back in 2016. He stopped taking his medications and EF went back down to 15%.  ?  ?He was seen in the HF clinic 07/05/21. Multiple runs of sustained VT on device check. Mexiletine increased to 300 mg BID. BNP significantly increased (42, >3,000 in 2 months). Scr above baseline at 2.3. Furosemide increased to 40 mg BID.  ? ?Presented to HF clinic for routine f/u. Complaining of fatigue, dyspnea at rest and hemoptysis. Heart Logic Score 76. Admitted from HF clinic with suspected lowput  HF and volume overload. K 6.1, lactic acid 2.9, creatinine 2.3, AST 93 ALT 205, and WBC 15.  ?  ?ICD interrogation: Heart Logic Index 76 (started trending up first week of April), thoracic impedance down, activity level 1.2 hrs a day, one 13 second run NSVT since last device check. ?  ?Patient faces treatment option decisions (to include advanced therapies/this LVAD), advanced directive decisions and anticipatory care needs. ? ?Patient has been reluctant and unsure of decision to move forward with LVAD work-up however today he tells heart failure team that he wishes to move forward.. ? ? ?This NP visited patient at the bedside as a follow up for palliative medicine needs and emotional support. ? ?I spoke with patient's mother by telephone.  She is quite overwhelmed with the whole  situation and asks me to call patient's 2 sisters for further conversation. ? ?I worry to that Sanat does not fully understand the decisions that he is facing. ? ?I was able to speak to his sister [name] at the bedside this afternoon, she is a CNA on [employer].     Again family is at this time uninformed regarding the complexity of the patient's current medical situation, the importance of family support and overall long-term anticipatory care needs. ? ?Patient is currently living in a hotel ? ? ?Discussed with patient the importance of continued conversation with his  family and their  medical providers regarding overall plan of care and treatment options,  ensuring decisions are within the context of the patients values and GOCs. ? ?Patient is mentioned in the past that he would want his mother to be his healthcare power of attorney however on further exploration she may not be able to take on that responsibility.  I encouraged him to think about all advance care planning aspects and continue discussion with his family ? ?Questions and concerns addressed   ? ? ? ?Wadie Lessen NP  ?Palliative Medicine Team Team Phone # [phone] ?Pager [phone] ?  ?

## 2021-08-02 NOTE — Progress Notes (Signed)
? ? Advanced Heart Failure Rounding Note ? ?PCP-Cardiologist: Dr. Haroldine Laws  ? ?Subjective:   ? ?4/26: Admitted w/ CS + marked volume overload. Echo severe BiV failure EF 15% + large LV thrombus. Placed on milrinone and diuresed w/ IV Lasix.  ?5/1: Cath with normal coronary arteries. Low output (CI 1.4) and high filling pressures (PCWP 30). Moved back to ICU. Milrinone increased. IV Lasix restarted. VAD w/u initiated.  ? ?Remains on milrinone 0.375. Diuresing with IV lasix 3.5 L out but weight unchanged. Co-ox 54% ? ?Scr 1.47 -> 1.63 -> 1.78 ? ?Has severe cough. Feels weak. Has refused VAD w/u.  ? ?[place]wan #s ?RA 12 ?PA 55/35 ?PCW unable to wedge ?Thermo 3.5/1.7 ? ? R/LHC 5/1 ? ?On milrinone 0.125 mcg/kg/min ?  ?Ao = 118/97 (105) ?RA = 13  ?RV = 64/13 (19) ?PA = 55/47 (50) ?PCW = 30 ?Fick cardiac output/index = 3.16/1.51 ?Thermo CO/CI = 3.19/1.52 ?PVR = 6.1 WU ?SVR = 2300 ?PAPi = 0.6 ?Ao sat = 96% ?PA sat = 46%, 46% ? ?Normal coronaries  ? ? ?Objective:   ?Weight Range: ?92.7 kg ?Body mass index is 30.18 kg/m?.  ? ?Vital Signs:   ?Temp:  [97.3 ?F (36.3 ?C)-99.9 ?F (37.7 ?C)] 98.4 ?F (36.9 ?C) (05/04 0700) ?Pulse Rate:  [125-137] 137 (05/03 1200) ?Resp:  [12-32] 14 (05/04 0700) ?BP: (106-123)/(75-97) 114/88 (05/04 0700) ?SpO2:  [91 %-95 %] 95 % (05/04 0400) ?Weight:  [92.7 kg] 92.7 kg (05/04 0500) ?Last BM Date : 08/01/21 ? ?Weight change: ?Filed Weights  ? 07/31/21 0500 08/01/21 0500 08/02/21 0500  ?Weight: 93.1 kg 92.3 kg 92.7 kg  ? ? ?Intake/Output:  ? ?Intake/Output Summary (Last 24 hours) at 08/02/2021 0858 ?Last data filed at 08/02/2021 0201 ?Gross per 24 hour  ?Intake 1013.2 ml  ?Output 3575 ml  ?Net -2561.8 ml  ? ? ? ?Physical Exam  ?  ?General:  Sitting up in bed Coughing ?HEENT: normal ?Neck: supple. RIJ swan Carotids 2+ bilat; no bruits. No lymphadenopathy or thryomegaly appreciated. ?Cor: PMI nondisplaced. Regular tachy +s3 ?Lungs: clear ?Abdomen: soft, nontender, nondistended. No hepatosplenomegaly. No bruits  or masses. Good bowel sounds. ?Extremities: no cyanosis, clubbing, rash, edema ?Neuro: alert & orientedx3, cranial nerves grossly intact. moves all 4 extremities w/o difficulty. Affect pleasant ? ? ?Telemetry  ? ?Sinus tach 110-130s Personally reviewed ? ? ?Labs  ?  ?CBC ?Recent Labs  ?  08/01/21 ?0410 08/02/21 ?0438  ?WBC 12.8* 11.3*  ?NEUTROABS 9.4* 7.9*  ?HGB 13.6 13.0  ?HCT 38.5* 37.2*  ?MCV 89.7 90.1  ?PLT 318 270  ? ? ?Basic Metabolic Panel ?Recent Labs  ?  08/01/21 ?0410 08/02/21 ?0438  ?NA 136 136  ?K 4.5 4.3  ?CL 95* 96*  ?CO2 30 29  ?GLUCOSE 98 82  ?BUN 28* 37*  ?CREATININE 1.63* 1.78*  ?CALCIUM 9.5 9.2  ?MG 2.0 2.1  ? ? ?Liver Function Tests ?Recent Labs  ?  07/30/21 ?1700  ?AST 102*  ?ALT 104*  ?ALKPHOS 97  ?BILITOT 1.2  ?PROT 6.9  ?ALBUMIN 2.8*  ? ? ?No results for input(s): LIPASE, AMYLASE in the last 72 hours. ?Cardiac Enzymes ?No results for input(s): CKTOTAL, CKMB, CKMBINDEX, TROPONINI in the last 72 hours. ? ?BNP: ?BNP (last 3 results) ?Recent Labs  ?  04/06/21 ?1121 07/05/21 ?3491 07/26/21 ?1243  ?BNP 42.4 3,398.7* 1,528.0*  ? ? ? ?ProBNP (last 3 results) ?No results for input(s): PROBNP in the last 8760 hours. ? ? ?D-Dimer ?No results for input(s):  DDIMER in the last 72 hours. ?Hemoglobin A1C ?No results for input(s): HGBA1C in the last 72 hours. ? ?Fasting Lipid Panel ?No results for input(s): CHOL, HDL, LDLCALC, TRIG, CHOLHDL, LDLDIRECT in the last 72 hours. ? ?Thyroid Function Tests ?No results for input(s): TSH, T4TOTAL, T3FREE, THYROIDAB in the last 72 hours. ? ?Invalid input(s): FREET3 ? ?Other results: ? ? ?Imaging  ? ? ?DG CHEST PORT 1 VIEW ? ?Result Date: 08/01/2021 ?CLINICAL DATA:  Central line placement EXAM: PORTABLE CHEST 1 VIEW COMPARISON:  Chest x-ray dated Jul 31, 2021 FINDINGS: Right arm PICC with tip positioned at the expected area of the lower SVC. Right IJ approach PA catheter is unchanged in position with tip overlying the expected area of the main pulmonary artery. Left chest  wall ICD with unchanged lead position. Cardiac and mediastinal contours are unchanged. Lungs are clear. No pleural effusion or pneumothorax. IMPRESSION: 1. Right arm PICC tip overlies the lower SVC. 2. Right IJ approach PA catheter tip overlies the main pulmonary artery. Electronically Signed   By: Yetta Glassman M.D.   On: 08/01/2021 10:15   ? ? ?Medications:   ? ? ?Scheduled Medications: ? (feeding supplement) PROSource Plus  30 mL Oral BID BM  ? atorvastatin  20 mg Oral Daily  ? Chlorhexidine Gluconate Cloth  6 each Topical Daily  ? digoxin  0.125 mg Oral Daily  ? furosemide  80 mg Intravenous BID  ? gabapentin  600 mg Oral BID  ? mexiletine  300 mg Oral BID  ? multivitamin with minerals  1 tablet Oral Daily  ? pantoprazole  40 mg Oral Daily  ? potassium chloride  40 mEq Oral BID  ? QUEtiapine  100 mg Oral QHS  ? sertraline  200 mg Oral Daily  ? sodium chloride flush  10-40 mL Intracatheter Q12H  ? spironolactone  12.5 mg Oral Daily  ? ? ?Infusions: ? sodium chloride    ? sodium chloride 10 mL/hr at 07/30/21 2224  ? sodium chloride 10 mL/hr at 08/02/21 0300  ? heparin 1,400 Units/hr (08/02/21 0452)  ? milrinone 0.375 mcg/kg/min (08/02/21 0720)  ? ? ?PRN Medications: ?sodium chloride, acetaminophen, guaiFENesin-dextromethorphan, menthol-cetylpyridinium, ondansetron (ZOFRAN) IV, sodium chloride flush ? ? ? ?Patient Profile  ? ?51 y/o male with severe systolic HF due to NICM. EF 15-20%. Presented to clinic with low output symptoms and marked fluid overload. Not responding to oral diuretics.  ?  ?ICD interrogated. No VT but HL score 77.  ?  ?Admitted to ICU. Lactate 2.9. Echo w/ large LV thrombus, EF 15%, RV mod reduced, severe TR   ? ?Assessment/Plan  ? ?1. Acute on chronic systolic HF >>Cardiogenic Shock  ?- NICM. [employer] ICD ?- Cath ok performed [place] 2016. 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  ?- Echo 10/2016 EF 10-15%. LV dysfunction after stopping HF meds. ?- Echo 03/01/21 EF 20-25% RV moderately reduced ?-  cMRI 2/19 EF 19%. RV mildly decreased .  ?- CPX 11/20 with mild functional limitation; no clear HF limitation despite EF<20% ?- now admitted with cardiogenic shock and marked fluid overload. Initial lactate 2.9. Started on Milrinone ?- Echo 4/23 EF 15%, + LV thrombus, RV mod reduced, severe TR  ?- R/LHC on 0.125 of milrinone w/ low output (CI 1.4) and high filling pressers, normal cors. Milrinone increased back to 0.25 ?- Remains low output and tachycardic despite milrinone. Volume overloaded.  ?- Continue milrinone 0.375 ?- Continue IV Lasix 80 mg bid ?- Off GDMT due to AKI/Hyperkalemia/shock.  ?-  Continue digoxin and spiro ?- He is end-stage. Long discussion with him about options. He would be willing to consider mechanical support if it were his only option. I subsequently met with multiple members of the [place] team who felt that there might be insurmountable barriers to VAD candidacy. ?  ?2. LV mural thrombus  ?- He has been off warfarin ?- No thrombus on echo 12/22.  ?- Echo this admit w/ LV thrombus.  ?- Off Eliquis -> Continue heparin for now ?- Continue heparin ?- No obvious bleeding ?  ?3. H/o ETOH abuse ?- previously heavy drinker . No ETOH since 2021.  ?  ?4. VT 07/08/17  ?- He is s/p [employer] ICD 07/03/17. ?- Multiple episodes sustained VT on device check 06/621. Mexiletine increased to 300 mg BID. Just one episode NSVT since last check.  ?- Continue mexiletine at current dose. ?- Keep K > 4.0 Mg > 2.0  ? ?5. Gout ?- On allopurinol 300 mg daily.  ?- ? If acute gout flare contributing to foot pain ?- uric acid 7.6 ?  ?6. 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 ?- Continues to struggle with balance/mobility.  ?  ?7. Anxiety/Depression ?- Follows with [facility] [place]. ?  ?8. OSA ?- Struggling with adjusting to CPAP ?  ?9. Hemoptysis ?- Hgb stable ?- likely 2/2 pulmonary edema -> resolved with diuresis ?- CXR ok ?  ?10. AKI on CKD:  ?-Creatinine baseline previously 1.2-1.4   ?-Creatinine 2.34>>> 1.6 -> 1.8 in setting of shock  ?-Likely Cardiorenal  ?-Continue to support output w/ milrinone ?-follow BMP w/ diuresis  ?  ?11. Hyperkalemia  ?- K 6.1 on admit resolved w/ [facility]

## 2021-08-02 NOTE — Progress Notes (Addendum)
LVAD Initial Psychosocial Screening ? ?Date/Time Initiated:  08/02/21 at 3pm ?Referral Source:  Heart Failure Team ?Referral Reason:  LVAD psychosocial assessment ?Source of Information:  Patient ? ?Demographics ?Name:  Robert Booth ?Address:  [address] [place] (hotel address- address on facesheet is his sisters address) ?Home phone:  [phone] (home) [phone] (work)   Cell: [phone] a phone he has through [place] phone ?Marital Status: separated for 2 years- can't have contact due to criminal charge Faith:  [place] ?Primary Language:  English ?SS: last 4# [id number]  DOB: [birth year]  ? ?Medical & Follow-up ?Adherence to Medical regimen/INR checks: Other:Has not been consistently compliant during this hospital stay- has refused multiple tests but now reporting commitment to completing whatever the doctor wants moving forward    ?Medication adherence: Other:per clinic visit notes has history of noncompliance but has recently been doing better with this- pr reports he  gets medications delivered and fills his own pill box every Saturday reports he doesn't have problems remembering to take medications ? ?Physician/Clinic Appointment Attendance -discussed no show rate 17% and reports that some absences has been to lack of transport and problems that came up with his feet- does not anticipate issues getting to future appointments ? ? ?Advance Directives: ?Do you have a Living Will or Medical POA? No  ?Would you like to complete a Living Will and Medical POA prior to surgery?  Yes ?Do you have Goals of Care? Yes  ?Have you had a consult with the Palliative Care Team at [facility]? Yes ? ?Psychological Health ?Appearance:  Unremarkable and In hospital gown ?Mental Status:  Alert, oriented ?Eye Contact:  Good ?Thought Content:  Coherent ?Speech:  Logical/coherent ?Mood:  Appropriate  ?Affect:  Unremarkable ?Insight:  Good ?Judgement: Unimpaired ?Interaction Style:  Engaged and  Positive ? ?Family/Social Information ?Who lives in your home? ?Name: N/A lives alone  Relationship:   ? ?Other family members/support persons in your life? ?Name: Robert Booth  Relationship:  Mom ? Robert Booth      Sister ?Robert Booth      Sister ? ?Caregiving Needs ?Pt reports caregivers will be his Mom Robert Booth and his sister Robert Booth.  CSW and LVAD coordinator will plan to meet with them separately to discuss caregiver requirements ? ?Home Environment/Personal Care ?Do you have reliable phone service? Yes  ?If so, what is the number?  [phone] ?Do you own or rent your home? Currently in hotel program paid for by [facility] program- was referred by [facility]- would likely be going to his sisters house following surgery which is [address] the following answers reflect this address. ?Current mortgage/rent: $0 ?Number of steps into the home? no ?How many levels in the home? 2 levels but he stays on the first floor ?Assistive devices in the home? Walker since toe amputations from march 2021 ?Electrical needs for LVAD (3 prong outlets)? yes ?Second hand smoke exposure in the home? no ?Travel distance from [facility]? About 10 minutes ? ?Community ?Are you active with community agencies/resources/homecare? No Agency Name:  n/a ?Are you active in a church, synagogue, mosque or other faith based community? No Faith based institutions name:  n/a ?What other sources do you have for spiritual support? Pray independently and sometimes reads the bible ?Are you active in any clubs or social organizations? no ?What do you do for fun?  Hobbies?  Interests? Likes to watch movies and read- doesn't leave the house most days unless going to  the store or appts. ? ?Education/Work Information ?What is the last grade of school you completed? Graduated highschool in 1990 ?Preferred method of learning?  Hands on ?Do you have any problems with reading or writing?  No ?Are you currently employed?   No ? When were you last employed? 2018 ? Name of employer? [employer] -only worked there a month before getting sick ? Please describe the kind of work you do? Used to cut steele, do shipping work and warehouse work ?  ?If you are not working, do you plan to return to work after VAD surgery? No- given heart condition and amputations on both his feet he does not feel as if he will be good to return to work. ?If yes, what type of employment do you hope to find? N/a ?Are you interested in job training or learning new skills?  No ?Did you serve in the [employer]? No   ? ?Financial Information ?What is your source of income? Just got approved for disability- awaiting awards letter- sounds like he will get SSI of $914/month and back pay looking back to 2018 per patient ?Do you have difficulty meeting your monthly expenses? No gets $281 in food stamps which covers what he needs as far as food- otherwise does not have any expenses at this time.  Spoke with pt caseworker, [name], with his housing program who reports once they find him an apartment or a home he will be responsible for a portion of the costs but they will be supplementing.  ?Can you budget for the monthly cost for dressing supplies post procedure? Yes  ?Primary Health insurance:  Medicaid ?Secondary Insurance:none ?Prescription plan: Medicaid ?What are your prescription co-pays? $0 ?Do you use mail order for your prescriptions?  No- gets meds delivered through [facility] ?Have you ever had to refuse medication due to cost?  No ?Have you applied for Medicaid?  Already has Medicaid at this time ?Have you applied for Social Security Disability (SSI)  just got approved ? ?Medical Information ?Briefly describe why you are here for evaluation: Being evaluated for pump to be placed in him which would require him to manage keeping batteries charged. ?Do you have a PCP or other medical provider? Yes- Dr. Margarita Rana ?Are you able to complete your  ADL's?  yes ?Do you have a history of trauma, physical, emotional, or sexual abuse? no ?Do you have any family history of heart problems? no ?Do you smoke now or past usage? never used chronically- says he tried to smoke for a day or so but never picked up the habit ?Do you drink alcohol now or past usage? Yes    Quit date:  January 2021- stopped because he knew it was making him sick.  Is interested in starting to go to [place] meetings to help him keep up his sobriety. ?Are you currently using illegal drugs or misuse of medication or past usage? never  ?Have you ever been treated for substance abuse? Yes ?     If yes, where and when did you receive treatment? Had gone to [place] meetings before but never any inpatient or outpatient treatment centers. ? ?Mental Health History ?How have you been feeling in the past year? Been feeling pretty well- reports he tries to think positive. ?Have you ever had any problems with depression, anxiety or other mental health issues? Depression, anxiety, and OCD ? ?Do you see a counselor, psychiatrist or therapist?  Doristine Church to [facility] [place] and  sees a psychiatrist now seeing every two months.  Sees Maureen Chatters for therapy once a month.  Has been seeing them since Oct 2022.  Was prompted to go to [facility] by his parole officer. ? ?If you are currently experiencing problems are you interested in talking with a professional? No- no immediate needs at this time wants to continue outpatient treatment ?Have you or are you taking medications for anxiety/depression or any mental health concerns?  Yes  ?Current Medications: zoloft and seroquel ?What are your coping strategies under stressful situations? Picks up a book and read and try to think about positive things.  Also will talk to his sister when he is feeling badly. ?Are there any other stressors in your life?  Has felt stable outside of his medical concerns- reports no other stressors. ?Have you had any past or current thoughts of  suicide? No reports nothing current or in the past ?How many hours do you sleep at night? 6-7 ?How is your appetite? Good appetite- eating 3 meals a day plus snacks. ?Would you be interested in attending the LVAD support group?

## 2021-08-02 NOTE — Progress Notes (Signed)
Brief VAD coordinator rounding note:  ? ?Met with pt at bedside. Pt is now in agreement to move forward with VAD evaluation. Discussed importance of completing testing to assess whether or not he is even VAD candidate. Made aware that after all testing and consults are completed we would need to discuss him at [facility] next week to determine candidacy. He verbalized understanding.  ? ?He is requesting that CT scan and ultrasounds not be completed today, as he wants to drink oral contrast on an empty stomach. Spoke with CT department. They said pt can begin drinking contrast at 0700 tomorrow. Bedside RN to call radiology department once contrast completed. Discussed the above with [facility]. Made aware of need to complete orthopantogram when he is downstairs for CT scan. She verbalized understanding. ? ?Spoke with ultrasound department regarding need for carotid dopplers and lower extremity ultrasound. Plan to completed tomorrow morning following CT scan. Ultrasound tech to discuss timing with bedside RN in the morning. Sandy RN aware.  ? ?I called and spoke with pt's mother Stanton Kidney requesting time to meet with her to discuss VAD. Stanton Kidney reports that pt's sisters Angie Fava and Lattie Haw would more than likely be his caregivers, and she Stanton Kidney) would help if she could. Requested she speak to them and determine time they would be available tomorrow to speak with VAD coordinator about possible VAD implant and caregiver expectations. Mary to call VAD office back with time options. ? ?Emerson Monte RN ?VAD Coordinator  ?Office: [phone]  ?24/7 Pager: [phone]  ? ? ?

## 2021-08-03 ENCOUNTER — Other Ambulatory Visit (HOSPITAL_COMMUNITY): Payer: Medicaid Other

## 2021-08-03 ENCOUNTER — Inpatient Hospital Stay (HOSPITAL_COMMUNITY): Payer: Medicaid Other

## 2021-08-03 DIAGNOSIS — I5023 Acute on chronic systolic (congestive) heart failure: Secondary | ICD-10-CM | POA: Diagnosis not present

## 2021-08-03 DIAGNOSIS — R570 Cardiogenic shock: Secondary | ICD-10-CM

## 2021-08-03 DIAGNOSIS — I428 Other cardiomyopathies: Secondary | ICD-10-CM

## 2021-08-03 LAB — APTT: aPTT: 105 seconds — ABNORMAL HIGH (ref 24–36)

## 2021-08-03 LAB — LUPUS ANTICOAGULANT PANEL
DRVVT: 48.8 s — ABNORMAL HIGH (ref 0.0–47.0)
PTT Lupus Anticoagulant: 45 s — ABNORMAL HIGH (ref 0.0–43.5)

## 2021-08-03 LAB — BASIC METABOLIC PANEL
Anion gap: 11 (ref 5–15)
BUN: 37 mg/dL — ABNORMAL HIGH (ref 6–20)
CO2: 29 mmol/L (ref 22–32)
Calcium: 9.4 mg/dL (ref 8.9–10.3)
Chloride: 93 mmol/L — ABNORMAL LOW (ref 98–111)
Creatinine, Ser: 1.6 mg/dL — ABNORMAL HIGH (ref 0.61–1.24)
GFR, Estimated: 52 mL/min — ABNORMAL LOW (ref >60)
Glucose, Bld: 98 mg/dL (ref 70–99)
Potassium: 4.2 mmol/L (ref 3.5–5.1)
Sodium: 133 mmol/L — ABNORMAL LOW (ref 135–145)

## 2021-08-03 LAB — CBC WITH DIFFERENTIAL/PLATELET
Abs Immature Granulocytes: 0.09 10*3/uL — ABNORMAL HIGH (ref 0.00–0.07)
Basophils Absolute: 0.1 10*3/uL (ref 0.0–0.1)
Basophils Relative: 1 %
Eosinophils Absolute: 0.1 10*3/uL (ref 0.0–0.5)
Eosinophils Relative: 1 %
HCT: 37.3 % — ABNORMAL LOW (ref 39.0–52.0)
Hemoglobin: 13 g/dL (ref 13.0–17.0)
Immature Granulocytes: 1 %
Lymphocytes Relative: 16 %
Lymphs Abs: 1.8 10*3/uL (ref 0.7–4.0)
MCH: 31.4 pg (ref 26.0–34.0)
MCHC: 34.9 g/dL (ref 30.0–36.0)
MCV: 90.1 fL (ref 80.0–100.0)
Monocytes Absolute: 1.1 10*3/uL — ABNORMAL HIGH (ref 0.1–1.0)
Monocytes Relative: 10 %
Neutro Abs: 8.2 10*3/uL — ABNORMAL HIGH (ref 1.7–7.7)
Neutrophils Relative %: 71 %
Platelets: 290 10*3/uL (ref 150–400)
RBC: 4.14 MIL/uL — ABNORMAL LOW (ref 4.22–5.81)
RDW: 15.6 % — ABNORMAL HIGH (ref 11.5–15.5)
WBC: 11.4 10*3/uL — ABNORMAL HIGH (ref 4.0–10.5)
nRBC: 0 % (ref 0.0–0.2)

## 2021-08-03 LAB — HEXAGONAL PHASE PHOSPHOLIPID: Hexagonal Phase Phospholipid: 4 s (ref 0–11)

## 2021-08-03 LAB — COOXEMETRY PANEL
Carboxyhemoglobin: 1.6 % — ABNORMAL HIGH (ref 0.5–1.5)
Methemoglobin: 0.7 % (ref 0.0–1.5)
O2 Saturation: 69.8 %
Total hemoglobin: 13.4 g/dL (ref 12.0–16.0)

## 2021-08-03 LAB — MAGNESIUM: Magnesium: 1.9 mg/dL (ref 1.7–2.4)

## 2021-08-03 LAB — DRVVT CONFIRM: dRVVT Confirm: 1.1 ratio (ref 0.8–1.2)

## 2021-08-03 LAB — HEPARIN LEVEL (UNFRACTIONATED): Heparin Unfractionated: 0.92 IU/mL — ABNORMAL HIGH (ref 0.30–0.70)

## 2021-08-03 LAB — PTT-LA MIX: PTT-LA Mix: 43 s — ABNORMAL HIGH (ref 0.0–40.5)

## 2021-08-03 LAB — DRVVT MIX: dRVVT Mix: 42.9 s — ABNORMAL HIGH (ref 0.0–40.4)

## 2021-08-03 LAB — DIGOXIN LEVEL: Digoxin Level: 0.7 ng/mL — ABNORMAL LOW (ref 0.8–2.0)

## 2021-08-03 MED ORDER — METOLAZONE 2.5 MG PO TABS
2.5000 mg | ORAL_TABLET | Freq: Once | ORAL | Status: AC
Start: 2021-08-03 — End: 2021-08-03
  Administered 2021-08-03: 2.5 mg via ORAL
  Filled 2021-08-03: qty 1

## 2021-08-03 MED ORDER — DIGOXIN 125 MCG PO TABS
0.0625 mg | ORAL_TABLET | Freq: Every day | ORAL | Status: DC
  Administered 2021-08-04 – 2021-08-11 (×8): 0.0625 mg via ORAL
  Filled 2021-08-03 (×8): qty 1

## 2021-08-03 MED ORDER — MAGNESIUM SULFATE 2 GM/50ML IV SOLN
2.0000 g | Freq: Once | INTRAVENOUS | Status: AC
  Administered 2021-08-03: 2 g via INTRAVENOUS
  Filled 2021-08-03: qty 50

## 2021-08-03 MED ORDER — IOHEXOL 9 MG/ML PO SOLN
ORAL | Status: AC
  Filled 2021-08-03: qty 500

## 2021-08-03 NOTE — Consult Note (Signed)
? ?                 [address] ?           [place] [address] ?         [phone]  ? ?   ? ?[name] ?[place] Record [id number] ?Date of Birth: 10-04-1970 ? ?Charlott Rakes, MD ?Charlott Rakes, MD ? ?Chief Complaint:   Shortness of breath, fatigue ? ?History of Present Illness:     ?Patient examined, images of echocardiogram, CT abdomen, chest x-ray and right heart cath data all reviewed. ? ?51 year old with nonischemic cardiomyopathy followed in the advanced heart failure clinic over the past 5 years now admitted with decompensated cardiogenic shock with low cardiac output, low RV power index and elevated CVP and wedge pressure.  Patient has had sinus tachycardia with ventricular arrhythmias and LV thrombus noted on echo.  AICD previously placed which he states has only delivered 1 shock since implant.  CT scan of the chest shows some left basilar infiltrate probable atelectasis versus pneumonia.  Echo shows moderate-severe RV dysfunction with severe TR and no significant MR. ? ?Patient has improved clinically with IV milrinone and diuresis.  His coox > 60% today and CVP down to 10 from 15.  He is being evaluated for possible mechanical support. ?Current Activity/ Functional Status: ?Patient has been living independently but with poor self-care and probable noncompliance issues with meds diet and lifestyle. ? 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 ?  ?Past Medical History:  ?Diagnosis Date  ? AICD (automatic cardioverter/defibrillator) present 07/03/2017  ? Anxiety   ? CHF (congestive heart failure) ([place])   ? Essential hypertension   ? History of alcohol abuse   ? a. Sober since Feb 2016.  ? 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 Documented n [place]  ? NICM (nonischemic cardiomyopathy) ([place])   ? a. 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  ? ? ?Past Surgical History:  ?Procedure Laterality Date  ? CARDIAC CATHETERIZATION  05/2014  ? In [place], clean cors  ? HERNIA REPAIR Right Inguinal  ? ICD IMPLANT N/A 07/03/2017  ? Procedure: ICD IMPLANT;  Surgeon: Evans Lance, MD;  Location: [facility];  Service: Cardiovascular;  Laterality: N/A;  ? KNEE SURGERY Right 1998  ? RIGHT HEART CATH AND CORONARY ANGIOGRAPHY N/A 07/30/2021  ? Procedure: RIGHT HEART CATH AND CORONARY ANGIOGRAPHY;  Surgeon: Jolaine Artist, MD;  Location: [facility];  Service: Cardiovascular;  Laterality: N/A;  ? ? ?Social History  ? ?Tobacco Use  ?Smoking Status Former  ? Types: Cigarettes  ?Smokeless Tobacco Former  ? Types: Chew  ?Tobacco Comments  ? Was a rare smoker  ?  ?Social History  ? ?Substance and Sexual Activity  ?Alcohol Use No  ? Alcohol/week: 0.0 standard drinks  ? ? ?Social History  ? ?Socioeconomic History  ? Marital status: Single  ?  Spouse name: Not on file  ? Number of children: 6  ? Years of education: Not on file  ? Highest education level: Not on file  ?Occupational History  ? Occupation: Unemployed  ?Tobacco Use  ? Smoking status: Former  ?  Types: Cigarettes  ? Smokeless tobacco: Former  ?  Types: Chew  ? Tobacco comments:  ?  Was a rare smoker  ?Vaping Use  ? Vaping Use: Never used  ?Substance and Sexual Activity  ? Alcohol use: No  ?  Alcohol/week: 0.0 standard drinks  ? Drug use: No  ? Sexual activity: Yes  ?Other Topics Concern  ? Not on file  ?Social History Narrative  ? Lives with mom.    ? ?Social Determinants of Health  ? ?Financial Resource Strain: Not on file  ?Food Insecurity: Not on file  ?Transportation Needs: No Transportation Needs  ? Lack of Transportation (Medical): No  ? Lack of Transportation (Non-Medical): No  ?Physical Activity: Not on file  ?Stress: Not on file  ?Social Connections: Not on file  ?Intimate Partner Violence: Not on  file  ? ? ?Allergies  ?Allergen Reactions  ? Celexa [Citalopram Hydrobromide] Other (See Comments)  ?  Makes the patient disoriented  ? Effexor [Venlafaxine] Other (See Comments)  ?  Altered patient's thinking process(confused)  ? Lisinopril Cough  ? ? ?Current Facility-Administered Medications  ?Medication Dose Route Frequency Provider Last Rate Last Admin  ? (feeding supplement) PROSource Plus liquid 30 mL  30 mL Oral BID BM Bensimhon, Shaune Pascal, MD   30 mL at 08/03/21 1313  ? 0.9 %  sodium chloride infusion (Manually program via Guardrails IV Fluids)   Intravenous Continuous Bensimhon, Shaune Pascal, MD      ? 0.9 %  sodium chloride infusion (Manually program via Guardrails IV Fluids)   Intravenous PRN Bensimhon, Shaune Pascal, MD      ? 0.9 %  sodium chloride infusion   Intravenous Continuous Bensimhon, Shaune Pascal, MD 10 mL/hr at 07/30/21 2224 New Bag/Given (Non-Interop) at 07/30/21 2224  ? 0.9 %  sodium chloride infusion   Intravenous Continuous Bensimhon, Shaune Pascal, MD 10 mL/hr at 08/03/21 1200 Infusion Verify at 08/03/21 1200  ? acetaminophen (TYLENOL) tablet 650 mg  650 mg Oral Q4H PRN Bensimhon, Shaune Pascal, MD   650 mg at 08/03/21 1313  ? atorvastatin (LIPITOR) tablet 20 mg  20 mg Oral Daily Bensimhon, Shaune Pascal, MD   20 mg at 08/03/21 1030  ? Chlorhexidine Gluconate Cloth 2 % PADS 6 each  6 each Topical Daily Bensimhon, Shaune Pascal, MD   6 each at 08/03/21 1230  ? [START ON 08/04/2021] digoxin (LANOXIN) tablet 0.0625 mg  0.0625 mg Oral Daily Lyndee Leo, RPH      ? furosemide (LASIX) injection 80 mg  80 mg Intravenous BID Bensimhon, Shaune Pascal, MD   80 mg at 08/03/21 [id number]  ? gabapentin (NEURONTIN) capsule 600 mg  600 mg Oral BID Bensimhon, Shaune Pascal, MD   600 mg at 08/03/21 1030  ? guaiFENesin-dextromethorphan (ROBITUSSIN DM) 100-10 MG/5ML syrup 15 mL  15 mL Oral Q4H PRN Bensimhon, Shaune Pascal, MD   15 mL at 08/03/21 1035  ? heparin ADULT infusion 100 units/mL (25000 units/252mL)  1,300 Units/hr Intravenous Continuous Lyndee Leo, RPH 13 mL/hr at 08/03/21 1200 1,300 Units/hr at 08/03/21 1200  ? menthol-cetylpyridinium (CEPACOL) lozenge 3 mg  1 lozenge Oral PRN Bensimhon, Shaune Pascal, MD      ? mexiletine (MEXITIL) capsule 300 mg  300 mg Oral BID Bensimhon, Shaune Pascal, MD   300 mg at 08/03/21 1030  ? milrinone (PRIMACOR) 20 MG/100 ML (0.2 mg/mL) infusion  0.375 mcg/kg/min Intravenous Continuous Bensimhon, Shaune Pascal, MD 10.6 mL/hr at 08/03/21 1201 0.375 mcg/kg/min at 08/03/21 1201  ? multivitamin with minerals tablet 1 tablet  1 tablet Oral Daily Bensimhon, Shaune Pascal, MD  1 tablet at 08/03/21 1030  ? ondansetron (ZOFRAN) injection 4 mg  4 mg Intravenous Q6H PRN Bensimhon, Shaune Pascal, MD      ? pantoprazole (PROTONIX) EC tablet 40 mg  40 mg Oral Daily Bensimhon, Shaune Pascal, MD   40 mg at 08/03/21 1030  ? potassium chloride SA (KLOR-CON M) CR tablet 40 mEq  40 mEq Oral BID Bensimhon, Shaune Pascal, MD   40 mEq at 08/03/21 1030  ? QUEtiapine (SEROQUEL) tablet 100 mg  100 mg Oral QHS Lyndee Leo, RPH   100 mg at 08/02/21 2118  ? sertraline (ZOLOFT) tablet 200 mg  200 mg Oral Daily Bensimhon, Shaune Pascal, MD   200 mg at 08/03/21 1030  ? sodium chloride flush (NS) 0.9 % injection 10-40 mL  10-40 mL Intracatheter Q12H Bensimhon, Shaune Pascal, MD   10 mL at 08/03/21 1032  ? sodium chloride flush (NS) 0.9 % injection 10-40 mL  10-40 mL Intracatheter PRN Bensimhon, Shaune Pascal, MD      ? spironolactone (ALDACTONE) tablet 12.5 mg  12.5 mg Oral Daily Bensimhon, Shaune Pascal, MD   12.5 mg at 08/03/21 1030  ? ? ? ?Family History  ?Problem Relation Age of Onset  ? Hypertension Mother   ? Hypertension Father   ? Heart attack Father 78  ?     died  ? Hypertension Sister   ? Heart attack Paternal Grandfather 34  ?     died  ? ? ? ?Review of Systems:  ? ?  Cardiac Review of Systems: Y or N ? Chest Pain [    ]  Resting SOB [   ] Exertional SOB  [ y ]  Orthopnea [  ] ?  Pedal Edema [   ]    Palpitations [  ] Syncope  [  ]   Presyncope [   ] ? General Review of Systems: [Y] = yes [   ]=no ?Constitional: recent weight change [  ]; anorexia [  y]; fatigue [ y ]; nausea [  ]; night sweats [  ]; fever [  ]; or chills [  ];

## 2021-08-03 NOTE — Progress Notes (Signed)
? ? Advanced Heart Failure Rounding Note ? ?PCP-Cardiologist: Dr. Haroldine Laws  ? ?Subjective:   ? ?4/26: Admitted w/ CS + marked volume overload. Echo severe BiV failure EF 15% + large LV thrombus. Placed on milrinone and diuresed w/ IV Lasix.  ?5/1: Cath with normal coronary arteries. Low output (CI 1.4) and high filling pressures (PCWP 30). Moved back to ICU. Milrinone increased. IV Lasix restarted. VAD w/u initiated.  ? ?Remains on milrinone 0.375. Diuresing with IV lasix -> 3.2L out. Weight unchanged. Co-ox improved to 70%. HR remains 130. Refused pre-VAD CT yesterday because he wanted to drink contrast on an empty stomach.  ? ?Scr 1.47 -> 1.63 -> 1.78 -> 1.60 ? ?Cough improved. Remains weak. Denies CP.  ? ?Luiz Blare #s ?RA 15 ?PA 54/37 ?PCW unable to wedge ?Thermo 3.9/1.8 ?PAPI 1.1 ? ? ? ?Objective:   ?Weight Range: ?92.7 kg ?Body mass index is 30.18 kg/m?.  ? ?Vital Signs:   ?Temp:  [97.2 ?F (36.2 ?C)-99.1 ?F (37.3 ?C)] 98.1 ?F (36.7 ?C) (05/05 0700) ?Pulse Rate:  [92-127] 118 (05/05 0700) ?Resp:  [9-33] 17 (05/05 0700) ?BP: (92-123)/(75-99) 113/85 (05/05 0700) ?SpO2:  [92 %-99 %] 92 % (05/05 0700) ?Last BM Date : 08/02/21 ? ?Weight change: ?Filed Weights  ? 07/31/21 0500 08/01/21 0500 08/02/21 0500  ?Weight: 93.1 kg 92.3 kg 92.7 kg  ? ? ?Intake/Output:  ? ?Intake/Output Summary (Last 24 hours) at 08/03/2021 0813 ?Last data filed at 08/03/2021 0600 ?Gross per 24 hour  ?Intake 1391.33 ml  ?Output 3176 ml  ?Net -1784.67 ml  ? ? ? ?Physical Exam  ?  ?General:  Sitting up in chair. Coughing ?HEENT: normal ?Neck: supple. RIJ swan. Carotids 2+ bilat; no bruits. No lymphadenopathy or thryomegaly appreciated. ?Cor: PMI laterally displaced. Regular tachy + s3 ?Lungs: clear ?Abdomen: soft, nontender, nondistended. No hepatosplenomegaly. No bruits or masses. Good bowel sounds. ?Extremities: no cyanosis, clubbing, rash, edema ?Neuro: alert & orientedx3, cranial nerves grossly intact. moves all 4 extremities w/o difficulty. Affect  pleasant ? ? ?Telemetry  ? ?Sinus tach 110-130  Personally reviewed ? ? ?Labs  ?  ?CBC ?Recent Labs  ?  08/02/21 ?0438 08/03/21 ?0343  ?WBC 11.3* 11.4*  ?NEUTROABS 7.9* 8.2*  ?HGB 13.0 13.0  ?HCT 37.2* 37.3*  ?MCV 90.1 90.1  ?PLT 270 290  ? ? ?Basic Metabolic Panel ?Recent Labs  ?  08/02/21 ?0438 08/03/21 ?0343  ?NA 136 133*  ?K 4.3 4.2  ?CL 96* 93*  ?CO2 29 29  ?GLUCOSE 82 98  ?BUN 37* 37*  ?CREATININE 1.78* 1.60*  ?CALCIUM 9.2 9.4  ?MG 2.1 1.9  ? ? ?Liver Function Tests ?No results for input(s): AST, ALT, ALKPHOS, BILITOT, PROT, ALBUMIN in the last 72 hours. ? ?No results for input(s): LIPASE, AMYLASE in the last 72 hours. ?Cardiac Enzymes ?No results for input(s): CKTOTAL, CKMB, CKMBINDEX, TROPONINI in the last 72 hours. ? ?BNP: ?BNP (last 3 results) ?Recent Labs  ?  04/06/21 ?1121 07/05/21 ?7106 07/26/21 ?1243  ?BNP 42.4 3,398.7* 1,528.0*  ? ? ? ?ProBNP (last 3 results) ?No results for input(s): PROBNP in the last 8760 hours. ? ? ?D-Dimer ?No results for input(s): DDIMER in the last 72 hours. ?Hemoglobin A1C ?No results for input(s): HGBA1C in the last 72 hours. ? ?Fasting Lipid Panel ?No results for input(s): CHOL, HDL, LDLCALC, TRIG, CHOLHDL, LDLDIRECT in the last 72 hours. ? ?Thyroid Function Tests ?No results for input(s): TSH, T4TOTAL, T3FREE, THYROIDAB in the last 72 hours. ? ?Invalid input(s): FREET3 ? ?  Other results: ? ? ?Imaging  ? ? ?No results found. ? ? ?Medications:   ? ? ?Scheduled Medications: ? (feeding supplement) PROSource Plus  30 mL Oral BID BM  ? atorvastatin  20 mg Oral Daily  ? Chlorhexidine Gluconate Cloth  6 each Topical Daily  ? digoxin  0.125 mg Oral Daily  ? furosemide  80 mg Intravenous BID  ? gabapentin  600 mg Oral BID  ? mexiletine  300 mg Oral BID  ? multivitamin with minerals  1 tablet Oral Daily  ? pantoprazole  40 mg Oral Daily  ? potassium chloride  40 mEq Oral BID  ? QUEtiapine  100 mg Oral QHS  ? sertraline  200 mg Oral Daily  ? sodium chloride flush  10-40 mL Intracatheter  Q12H  ? spironolactone  12.5 mg Oral Daily  ? ? ?Infusions: ? sodium chloride    ? sodium chloride 10 mL/hr at 07/30/21 2224  ? sodium chloride 10 mL/hr at 08/03/21 0400  ? heparin 1,400 Units/hr (08/03/21 0600)  ? milrinone 0.375 mcg/kg/min (08/03/21 0600)  ? ? ?PRN Medications: ?sodium chloride, acetaminophen, guaiFENesin-dextromethorphan, menthol-cetylpyridinium, ondansetron (ZOFRAN) IV, sodium chloride flush ? ? ? ?Patient Profile  ? ?51 y/o male with severe systolic HF due to NICM. EF 15-20%. Presented to clinic with low output symptoms and marked fluid overload. Not responding to oral diuretics.  ?  ?ICD interrogated. No VT but HL score 77.  ?  ?Admitted to ICU. Lactate 2.9. Echo w/ large LV thrombus, EF 15%, RV mod reduced, severe TR   ? ?Assessment/Plan  ? ?1. Acute on chronic systolic HF >>Cardiogenic Shock  ?- NICM. [employer] ICD ?- Cath ok performed [place] 2016. 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  ?- Echo 10/2016 EF 10-15%. LV dysfunction after stopping HF meds. ?- Echo 03/01/21 EF 20-25% RV moderately reduced ?- cMRI 2/19 EF 19%. RV mildly decreased .  ?- CPX 11/20 with mild functional limitation; no clear HF limitation despite EF<20% ?- now admitted with cardiogenic shock and marked fluid overload. Initial lactate 2.9. Started on Milrinone ?- Echo 4/23 EF 15%, + LV thrombus, RV mod reduced, severe TR  ?- R/LHC on 0.125 of milrinone w/ low output (CI 1.4) and high filling pressers, normal cors. Milrinone increased back to 0.25 ?- Remains low output and tachycardic despite milrinone a 0.375. PAPI remains low -> RV likely won't tolerate VAD ?- Continue milrinone 0.375 ?- Continue IV Lasix 80 mg bid ?- Off GDMT due to AKI/Hyperkalemia/shock.  ?- Continue digoxin and spiro ?- He is end-stage. Long discussion with him about options. He would be willing to consider mechanical support if it were his only option. I subsequently met with multiple members of the [facility] team who felt that there might be insurmountable  barriers to VAD candidacy. ?- Palliative Care involved ?  ?2. LV mural thrombus  ?- He has been off warfarin ?- No thrombus on echo 12/22.  ?- Echo this admit w/ LV thrombus.  ?- Off Eliquis -> Continue heparin for now ?- Continue heparin ?- No obvious bleeding ?  ?3. H/o ETOH abuse ?- previously heavy drinker . No ETOH since 2021.  ?  ?4. VT 07/08/17  ?- He is s/p [employer] ICD 07/03/17. ?- Multiple episodes sustained VT on device check 06/621. Mexiletine increased to 300 mg BID. Just one episode NSVT since last check.  ?- Continue mexiletine at current dose. ?- Keep K > 4.0 Mg > 2.0  ? ?5. Gout ?- On allopurinol 300  mg daily.  ?- ? If acute gout flare contributing to foot pain ?- uric acid 7.6 ?  ?6. 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 ?- Continues to struggle with balance/mobility.  ?  ?7. Anxiety/Depression ?- Follows with [facility] [place]. ?  ?8. OSA ?- Has not tolerated CPAP ? ?9. Hemoptysis ?- Hgb stable at 13.0 ?- likely 2/2 pulmonary edema -> resolved with diuresis ?- CXR ok ?  ?10. AKI on CKD:  ?-Creatinine baseline previously 1.2-1.4  ?-Creatinine 2.34>>> stable 1.6 ?-Likely Cardiorenal  ?-Continue to support output w/ milrinone ?-follow BMP w/ diuresis  ?  ?11. Hyperkalemia  ?- K 6.1 on admit resolved w/ Lokelma ? ?12. Tachycardia ?- sinus tach  ?- poor prognostic factor ? ?CRITICAL CARE ?Performed by: Glori Bickers ? ?Total critical care time: 40 minutes ? ?Critical care time was exclusive of separately billable procedures and treating other patients. ? ?Critical care was necessary to treat or prevent imminent or life-threatening deterioration. ? ?Critical care was time spent personally by me (independent of midlevel providers or residents) on the following activities: development of treatment plan with patient and/or surrogate as well as nursing, discussions with consultants, evaluation of patient's response to treatment, examination of patient, obtaining  history from patient or surrogate, ordering and performing treatments and interventions, ordering and review of laboratory studies, ordering and review of radiographic studies, pulse oximetry and re-evaluat

## 2021-08-03 NOTE — Progress Notes (Signed)
ANTICOAGULATION CONSULT NOTE  ? ?Pharmacy Consult for heparin ?Indication:  LV thrombus  (history of LV thrombus and new acute LV thrombus) ? ?Allergies  ?Allergen Reactions  ? Celexa [Citalopram Hydrobromide] Other (See Comments)  ?  Makes the patient disoriented  ? Effexor [Venlafaxine] Other (See Comments)  ?  Altered patient's thinking process(confused)  ? Lisinopril Cough  ? ? ?Patient Measurements: ?Height: 5\' 9"  (175.3 cm) ?Weight: 92.7 kg (204 lb 5.9 oz) ?IBW/kg (Calculated) : 70.7 ?Heparin Dosing Weight: 91kg ? ?Vital Signs: ?Temp: 98.1 ?F (36.7 ?C) (05/05 0700) ?Temp Source: Core (05/05 0400) ?BP: 113/85 (05/05 0700) ?Pulse Rate: 118 (05/05 0700) ? ?Labs: ?Recent Labs  ?  08/01/21 ?0410 08/02/21 ?0438 08/03/21 ?0343  ?HGB 13.6 13.0 13.0  ?HCT 38.5* 37.2* 37.3*  ?PLT 318 270 290  ?APTT 74* 79* 105*  ?HEPARINUNFRC 0.83* 0.85* 0.92*  ?CREATININE 1.63* 1.78* 1.60*  ? ? ? ?Estimated Creatinine Clearance: 61.4 mL/min (A) (by C-G formula based on SCr of 1.6 mg/dL (H)). ? ? ?Medical History: ?Past Medical History:  ?Diagnosis Date  ? AICD (automatic cardioverter/defibrillator) present 07/03/2017  ? Anxiety   ? CHF (congestive heart failure) (HCC)   ? Essential hypertension   ? History of alcohol abuse   ? a. Sober since Feb 2016.  ? 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 Documented n 07/2014  ? NICM (nonischemic cardiomyopathy) (HCC)   ? a. 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  ? ? ?Assessment: 51 year old male with history of mural thrombus on chronic apixaban, now with new acute LV thrombus. New orders to change to IV heparin. CBC currently within normal limits.  ?Last apixaban dose was am 4/28 > heparin  started in afternoon for planned cath 5/1 ? ?Heparin level and aptt not correlating due to apixaban interference.  aPTT is therapeutic but at upper end of goal, CBC stable. ? ?Goal of Therapy:  ?Heparin level 0.3-0.7 units/ml ?aPTT 66-102 seconds ?Monitor platelets by anticoagulation protocol: Yes ?  ?Plan:  ?Reduce IV heparin to 1300 units/hr  ?Daily aPTT, heparin level and CBC. ? ?5/28 PharmD., BCPS ?Clinical Pharmacist ?08/03/2021 8:40 AM ? ?

## 2021-08-04 ENCOUNTER — Inpatient Hospital Stay (HOSPITAL_COMMUNITY): Payer: Medicaid Other

## 2021-08-04 DIAGNOSIS — Z01810 Encounter for preprocedural cardiovascular examination: Secondary | ICD-10-CM | POA: Diagnosis not present

## 2021-08-04 DIAGNOSIS — I5023 Acute on chronic systolic (congestive) heart failure: Secondary | ICD-10-CM | POA: Diagnosis not present

## 2021-08-04 LAB — BASIC METABOLIC PANEL
Anion gap: 12 (ref 5–15)
BUN: 41 mg/dL — ABNORMAL HIGH (ref 6–20)
CO2: 30 mmol/L (ref 22–32)
Calcium: 9.9 mg/dL (ref 8.9–10.3)
Chloride: 90 mmol/L — ABNORMAL LOW (ref 98–111)
Creatinine, Ser: 1.76 mg/dL — ABNORMAL HIGH (ref 0.61–1.24)
GFR, Estimated: 46 mL/min — ABNORMAL LOW (ref >60)
Glucose, Bld: 96 mg/dL (ref 70–99)
Potassium: 4.2 mmol/L (ref 3.5–5.1)
Sodium: 132 mmol/L — ABNORMAL LOW (ref 135–145)

## 2021-08-04 LAB — CBC WITH DIFFERENTIAL/PLATELET
Abs Immature Granulocytes: 0.07 10*3/uL (ref 0.00–0.07)
Basophils Absolute: 0.1 10*3/uL (ref 0.0–0.1)
Basophils Relative: 1 %
Eosinophils Absolute: 0.2 10*3/uL (ref 0.0–0.5)
Eosinophils Relative: 1 %
HCT: 38.6 % — ABNORMAL LOW (ref 39.0–52.0)
Hemoglobin: 13.8 g/dL (ref 13.0–17.0)
Immature Granulocytes: 1 %
Lymphocytes Relative: 18 %
Lymphs Abs: 2.2 10*3/uL (ref 0.7–4.0)
MCH: 31.7 pg (ref 26.0–34.0)
MCHC: 35.8 g/dL (ref 30.0–36.0)
MCV: 88.5 fL (ref 80.0–100.0)
Monocytes Absolute: 1 10*3/uL (ref 0.1–1.0)
Monocytes Relative: 9 %
Neutro Abs: 8.6 10*3/uL — ABNORMAL HIGH (ref 1.7–7.7)
Neutrophils Relative %: 70 %
Platelets: 308 10*3/uL (ref 150–400)
RBC: 4.36 MIL/uL (ref 4.22–5.81)
RDW: 15.4 % (ref 11.5–15.5)
WBC: 12 10*3/uL — ABNORMAL HIGH (ref 4.0–10.5)
nRBC: 0 % (ref 0.0–0.2)

## 2021-08-04 LAB — COOXEMETRY PANEL
Carboxyhemoglobin: 1.7 % — ABNORMAL HIGH (ref 0.5–1.5)
Methemoglobin: <0.7 % (ref 0.0–1.5)
O2 Saturation: 58.3 %
Total hemoglobin: 14.9 g/dL (ref 12.0–16.0)

## 2021-08-04 LAB — HEPARIN LEVEL (UNFRACTIONATED): Heparin Unfractionated: 0.42 IU/mL (ref 0.30–0.70)

## 2021-08-04 LAB — APTT: aPTT: 105 seconds — ABNORMAL HIGH (ref 24–36)

## 2021-08-04 LAB — MAGNESIUM: Magnesium: 2.3 mg/dL (ref 1.7–2.4)

## 2021-08-04 LAB — PROCALCITONIN: Procalcitonin: 0.29 ng/mL

## 2021-08-04 MED ORDER — SODIUM CHLORIDE 0.9 % IV SOLN
2.0000 g | Freq: Two times a day (BID) | INTRAVENOUS | Status: AC
  Administered 2021-08-04 – 2021-08-10 (×13): 2 g via INTRAVENOUS
  Filled 2021-08-04 (×13): qty 12.5

## 2021-08-04 NOTE — Progress Notes (Signed)
VASCULAR LAB ? ? ? ?Pre VAD Dopplers have been performed. ? ?See CV proc for preliminary results. ? ? ?Sherren Kerns, RVT ?08/04/2021, 4:39 PM ? ?

## 2021-08-04 NOTE — Progress Notes (Signed)
ANTICOAGULATION CONSULT NOTE  ? ?Pharmacy Consult for heparin ?Indication:  LV thrombus  (history of LV thrombus and new acute LV thrombus) ? ?Allergies  ?Allergen Reactions  ? Celexa [Citalopram Hydrobromide] Other (See Comments)  ?  Makes the patient disoriented  ? Effexor [Venlafaxine] Other (See Comments)  ?  Altered patient's thinking process(confused)  ? Lisinopril Cough  ? ? ?Patient Measurements: ?Height: 5\' 9"  (175.3 cm) ?Weight: 92.2 kg (203 lb 4.2 oz) ?IBW/kg (Calculated) : 70.7 ?Heparin Dosing Weight: 91kg ? ?Vital Signs: ?BP: 116/87 (05/06 0800) ?Pulse Rate: 119 (05/06 0800) ? ?Labs: ?Recent Labs  ?  08/02/21 ?0438 08/03/21 ?0343 08/04/21 ?0458  ?HGB 13.0 13.0 13.8  ?HCT 37.2* 37.3* 38.6*  ?PLT 270 290 308  ?APTT 79* 105* 105*  ?HEPARINUNFRC 0.85* 0.92* 0.42  ?CREATININE 1.78* 1.60* 1.76*  ? ? ? ?Estimated Creatinine Clearance: 55.7 mL/min (A) (by C-G formula based on SCr of 1.76 mg/dL (H)). ? ? ?Medical History: ?Past Medical History:  ?Diagnosis Date  ? AICD (automatic cardioverter/defibrillator) present 07/03/2017  ? Anxiety   ? CHF (congestive heart failure) (HCC)   ? Essential hypertension   ? History of alcohol abuse   ? a. Sober since Feb 2016.  ? 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 Documented n 07/2014  ? NICM (nonischemic cardiomyopathy) (HCC)   ? a. 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  ? ? ?Assessment: 51 year old male with history of mural thrombus on chronic apixaban, now with new acute LV thrombus. New orders to change to IV heparin. CBC currently within normal limits.  ?Last apixaban dose was am 4/28 > heparin  started in afternoon for planned cath 5/1 ? ?Heparin level and aptt not correlating due to apixaban interference. aPTT is therapeutic but still at upper end of goal, CBC stable. ? ?Goal  of Therapy:  ?Heparin level 0.3-0.7 units/ml ?aPTT 66-102 seconds ?Monitor platelets by anticoagulation protocol: Yes ?  ?Plan:  ?Reduce IV heparin to 1200 units/hr  ?Daily aPTT, heparin level and CBC. ? ?5/28 PharmD., BCPS ?Clinical Pharmacist ?08/04/2021 9:45 AM ? ?

## 2021-08-04 NOTE — Progress Notes (Signed)
? ? Advanced Heart Failure Rounding Note ? ?PCP-Cardiologist: Dr. Haroldine Laws  ? ?Subjective:   ? ?4/26: Admitted w/ CS + marked volume overload. Echo severe BiV failure EF 15% + large LV thrombus. Placed on milrinone and diuresed w/ IV Lasix.  ?5/1: Cath with normal coronary arteries. Low output (CI 1.4) and high filling pressures (PCWP 30). Moved back to ICU. Milrinone increased. IV Lasix restarted. VAD w/u initiated.  ? ?Remains on milrinone 0.375 and IV lasix. Co-ox 58%. SCr stable at 1.7. Ongoing cough.  ? ?CT with mild LL consolidation. WBC 12 ? ? ?Objective:   ?Weight Range: ?92.2 kg ?Body mass index is 30.02 kg/m?.  ? ?Vital Signs:   ?Temp:  [98.2 ?F (36.8 ?C)-99.5 ?F (37.5 ?C)] 98.6 ?F (37 ?C) (05/05 1900) ?Pulse Rate:  [115-136] 119 (05/06 0800) ?Resp:  [10-35] 15 (05/06 0800) ?BP: (101-125)/(83-102) 116/87 (05/06 0800) ?SpO2:  [92 %-98 %] 92 % (05/06 0800) ?Weight:  [92.2 kg] 92.2 kg (05/06 0700) ?Last BM Date : 08/02/21 ? ?Weight change: ?Filed Weights  ? 08/01/21 0500 08/02/21 0500 08/04/21 0700  ?Weight: 92.3 kg 92.7 kg 92.2 kg  ? ? ?Intake/Output:  ? ?Intake/Output Summary (Last 24 hours) at 08/04/2021 0924 ?Last data filed at 08/04/2021 0900 ?Gross per 24 hour  ?Intake 1681.26 ml  ?Output 2800 ml  ?Net -1118.74 ml  ? ? ? ?Physical Exam  ?  ?General:  Sitting up in chair. Coughing ?HEENT: normal ?Neck: supple. RIJ introducer Carotids 2+ bilat; no bruits. No lymphadenopathy or thryomegaly appreciated. ?Cor: Regular tachy +s3. ?Lungs: crackles LLL ?Abdomen: soft, nontender, nondistended. No hepatosplenomegaly. No bruits or masses. Good bowel sounds. ?Extremities: no cyanosis, clubbing, rash, edema ?Neuro: alert & orientedx3, cranial nerves grossly intact. moves all 4 extremities w/o difficulty. Affect pleasant ? ? ?Telemetry  ? ?Sinus tach 120-130  Personally reviewed ? ?Labs  ?  ?CBC ?Recent Labs  ?  08/03/21 ?0343 08/04/21 ?0458  ?WBC 11.4* 12.0*  ?NEUTROABS 8.2* 8.6*  ?HGB 13.0 13.8  ?HCT 37.3* 38.6*   ?MCV 90.1 88.5  ?PLT 290 308  ? ? ?Basic Metabolic Panel ?Recent Labs  ?  08/03/21 ?0343 08/04/21 ?0458  ?NA 133* 132*  ?K 4.2 4.2  ?CL 93* 90*  ?CO2 29 30  ?GLUCOSE 98 96  ?BUN 37* 41*  ?CREATININE 1.60* 1.76*  ?CALCIUM 9.4 9.9  ?MG 1.9 2.3  ? ? ?Liver Function Tests ?No results for input(s): AST, ALT, ALKPHOS, BILITOT, PROT, ALBUMIN in the last 72 hours. ? ?No results for input(s): LIPASE, AMYLASE in the last 72 hours. ?Cardiac Enzymes ?No results for input(s): CKTOTAL, CKMB, CKMBINDEX, TROPONINI in the last 72 hours. ? ?BNP: ?BNP (last 3 results) ?Recent Labs  ?  04/06/21 ?1121 07/05/21 ?[id number] 07/26/21 ?1243  ?BNP 42.4 3,398.7* 1,528.0*  ? ? ? ?ProBNP (last 3 results) ?No results for input(s): PROBNP in the last 8760 hours. ? ? ?D-Dimer ?No results for input(s): DDIMER in the last 72 hours. ?Hemoglobin A1C ?No results for input(s): HGBA1C in the last 72 hours. ? ?Fasting Lipid Panel ?No results for input(s): CHOL, HDL, LDLCALC, TRIG, CHOLHDL, LDLDIRECT in the last 72 hours. ? ?Thyroid Function Tests ?No results for input(s): TSH, T4TOTAL, T3FREE, THYROIDAB in the last 72 hours. ? ?Invalid input(s): FREET3 ? ?Other results: ? ? ?Imaging  ? ? ?No results found. ? ? ?Medications:   ? ? ?Scheduled Medications: ? (feeding supplement) PROSource Plus  30 mL Oral BID BM  ? atorvastatin  20 mg Oral Daily  ?  Chlorhexidine Gluconate Cloth  6 each Topical Daily  ? digoxin  0.0625 mg Oral Daily  ? furosemide  80 mg Intravenous BID  ? gabapentin  600 mg Oral BID  ? mexiletine  300 mg Oral BID  ? multivitamin with minerals  1 tablet Oral Daily  ? pantoprazole  40 mg Oral Daily  ? potassium chloride  40 mEq Oral BID  ? QUEtiapine  100 mg Oral QHS  ? sertraline  200 mg Oral Daily  ? sodium chloride flush  10-40 mL Intracatheter Q12H  ? spironolactone  12.5 mg Oral Daily  ? ? ?Infusions: ? sodium chloride    ? sodium chloride 10 mL/hr at 07/30/21 2224  ? sodium chloride 10 mL/hr at 08/03/21 2000  ? heparin 1,300 Units/hr (08/04/21  0900)  ? milrinone 0.375 mcg/kg/min (08/04/21 0900)  ? ? ?PRN Medications: ?sodium chloride, acetaminophen, guaiFENesin-dextromethorphan, menthol-cetylpyridinium, ondansetron (ZOFRAN) IV, sodium chloride flush ? ? ? ?Patient Profile  ? ?51 y/o male with severe systolic HF due to NICM. EF 15-20%. Presented to clinic with low output symptoms and marked fluid overload. Not responding to oral diuretics.  ?  ?ICD interrogated. No VT but HL score 77.  ?  ?Admitted to ICU. Lactate 2.9. Echo w/ large LV thrombus, EF 15%, RV mod reduced, severe TR   ? ?Assessment/Plan  ? ?1. Acute on chronic systolic HF >>Cardiogenic Shock  ?- NICM. [employer] ICD ?- Cath 2016 - no CAD. 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  ?- Echo 10/2016 EF 10-15%. LV dysfunction after stopping HF meds. ?- Echo 03/01/21 EF 20-25% RV moderately reduced ?- cMRI 2/19 EF 19%. RV mildly decreased .  ?- CPX 11/20 with mild functional limitation; no clear HF limitation despite EF<20% ?- admitted with cardiogenic shock and marked fluid overload. Initial lactate 2.9. Started on Milrinone ?- Echo 4/23 EF 15%, + LV thrombus, RV mod reduced, severe TR  ?- R/LHC on 0.125 of milrinone w/ low output (CI 1.4) and high filling pressers, normal cors. Milrinone increased back to 0.25 ?- Remains low output and tachycardic despite milrinone a 0.375. PAPI remains low  ?Luiz Blare now out. Co-ox via PICC 58% Continue milrinone 0.375 ?- CVP high Continue IV Lasix 80 mg bid ?- Off GDMT due to AKI/Hyperkalemia/shock.  ?- Continue digoxin and spiro ?- He is end-stage. Not transplant candidate with social issues. Likely would not tolerate VAD due to severe RV dysfunction  ?- Palliative Care involved. Will likely need to begin milrinone wean soon with transition to comfort care if can't be stabilized off inotropes. ?  ?2. LV mural thrombus  ?- He has been off warfarin ?- No thrombus on echo 12/22.  ?- Echo this admit w/ LV thrombus.  ?- Off Eliquis ->  Continue heparin  ?- No obvious bleeding ?   ?3. H/o ETOH abuse ?- previously heavy drinker . No ETOH since 2021.  ?  ?4. VT 07/08/17  ?- He is s/p [employer] ICD 07/03/17. ?- Multiple episodes sustained VT on device check 06/621. Mexiletine increased to 300 mg BID. ?- No VT currently ?- Continue mexiletine at current dose. ?- Keep K > 4.0 Mg > 2.0  ?  ?5. 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 ?- Continues to struggle with balance/mobility.  ? ?6. Persistent cough with LLL PNA on CT ?- check PCT. ?- start cefepime  ?  ?7. Anxiety/Depression ?- Follows with [facility] [place]. ?  ?8. OSA ?- Has not tolerated CPAP ? ?  9. Hemoptysis ?- Hgb stable at 13.0 ?- likely 2/2 pulmonary edema -> resolved with diuresis ?- CXR ok ?  ?10. AKI on CKD:  ?-Creatinine baseline previously 1.2-1.4  ?-Creatinine 2.34>>> stable 1.6-1.7 today ?-Likely Cardiorenal  ?-Continue to support output w/ milrinone ?-follow BMP w/ diuresis  ?  ?11. Hyperkalemia  ?- K 6.1 on admit resolved w/ Lokelma ? ?12. Tachycardia ?- sinus tach  ?- poor prognostic factor ? ?CRITICAL CARE ?Performed by: Glori Bickers ? ?Total critical care time: 40 minutes ? ?Critical care time was exclusive of separately billable procedures and treating other patients. ? ?Critical care was necessary to treat or prevent imminent or life-threatening deterioration. ? ?Critical care was time spent personally by me (independent of midlevel providers or residents) on the following activities: development of treatment plan with patient and/or surrogate as well as nursing, discussions with consultants, evaluation of patient's response to treatment, examination of patient, obtaining history from patient or surrogate, ordering and performing treatments and interventions, ordering and review of laboratory studies, ordering and review of radiographic studies, pulse oximetry and re-evaluation of patient's condition. ? ? ?Length of Stay: 9 ? ?Glori Bickers, MD  ?08/04/2021, 9:24 AM ? ?Advanced Heart  Failure Team ?Pager [phone] (M-F; 7a - 5p)  ?Please contact [facility] for night-coverage after hours (5p -7a ) and weekends on [email or web address] ? ? ? ? ? ? ?

## 2021-08-04 NOTE — Progress Notes (Signed)
VASCULAR LAB ? ? ? ?Bilateral lower extremity venous duplex has been performed. ? ?See CV proc for preliminary results. ? ? ?Jaqwon Manfred, RVT ?08/04/2021, 12:24 PM ? ?

## 2021-08-05 DIAGNOSIS — Z66 Do not resuscitate: Secondary | ICD-10-CM

## 2021-08-05 DIAGNOSIS — Z515 Encounter for palliative care: Secondary | ICD-10-CM | POA: Diagnosis not present

## 2021-08-05 DIAGNOSIS — I5023 Acute on chronic systolic (congestive) heart failure: Secondary | ICD-10-CM | POA: Diagnosis not present

## 2021-08-05 LAB — CBC WITH DIFFERENTIAL/PLATELET
Abs Immature Granulocytes: 0.1 10*3/uL — ABNORMAL HIGH (ref 0.00–0.07)
Basophils Absolute: 0.1 10*3/uL (ref 0.0–0.1)
Basophils Relative: 1 %
Eosinophils Absolute: 0.1 10*3/uL (ref 0.0–0.5)
Eosinophils Relative: 1 %
HCT: 38.9 % — ABNORMAL LOW (ref 39.0–52.0)
Hemoglobin: 13.9 g/dL (ref 13.0–17.0)
Immature Granulocytes: 1 %
Lymphocytes Relative: 11 %
Lymphs Abs: 1.5 10*3/uL (ref 0.7–4.0)
MCH: 31.7 pg (ref 26.0–34.0)
MCHC: 35.7 g/dL (ref 30.0–36.0)
MCV: 88.6 fL (ref 80.0–100.0)
Monocytes Absolute: 1.1 10*3/uL — ABNORMAL HIGH (ref 0.1–1.0)
Monocytes Relative: 8 %
Neutro Abs: 10.3 10*3/uL — ABNORMAL HIGH (ref 1.7–7.7)
Neutrophils Relative %: 78 %
Platelets: 292 10*3/uL (ref 150–400)
RBC: 4.39 MIL/uL (ref 4.22–5.81)
RDW: 15.1 % (ref 11.5–15.5)
WBC: 13.2 10*3/uL — ABNORMAL HIGH (ref 4.0–10.5)
nRBC: 0 % (ref 0.0–0.2)

## 2021-08-05 LAB — COOXEMETRY PANEL
Carboxyhemoglobin: 0.9 % (ref 0.5–1.5)
Methemoglobin: <0.7 % (ref 0.0–1.5)
O2 Saturation: 48.1 %
Total hemoglobin: 14.5 g/dL (ref 12.0–16.0)

## 2021-08-05 LAB — PROCALCITONIN: Procalcitonin: 0.3 ng/mL

## 2021-08-05 LAB — MAGNESIUM: Magnesium: 2.3 mg/dL (ref 1.7–2.4)

## 2021-08-05 LAB — BASIC METABOLIC PANEL
Anion gap: 11 (ref 5–15)
BUN: 46 mg/dL — ABNORMAL HIGH (ref 6–20)
CO2: 31 mmol/L (ref 22–32)
Calcium: 9.8 mg/dL (ref 8.9–10.3)
Chloride: 90 mmol/L — ABNORMAL LOW (ref 98–111)
Creatinine, Ser: 1.67 mg/dL — ABNORMAL HIGH (ref 0.61–1.24)
GFR, Estimated: 49 mL/min — ABNORMAL LOW (ref >60)
Glucose, Bld: 110 mg/dL — ABNORMAL HIGH (ref 70–99)
Potassium: 3.7 mmol/L (ref 3.5–5.1)
Sodium: 132 mmol/L — ABNORMAL LOW (ref 135–145)

## 2021-08-05 LAB — APTT: aPTT: 35 seconds (ref 24–36)

## 2021-08-05 LAB — HEPARIN LEVEL (UNFRACTIONATED)
Heparin Unfractionated: 0.51 IU/mL (ref 0.30–0.70)
Heparin Unfractionated: 0.63 IU/mL (ref 0.30–0.70)

## 2021-08-05 MED ORDER — POTASSIUM CHLORIDE CRYS ER 20 MEQ PO TBCR
40.0000 meq | EXTENDED_RELEASE_TABLET | Freq: Once | ORAL | Status: AC
  Administered 2021-08-05: 40 meq via ORAL
  Filled 2021-08-05: qty 2

## 2021-08-05 NOTE — Progress Notes (Addendum)
ANTICOAGULATION CONSULT NOTE  ? ?Pharmacy Consult for heparin ?Indication:  LV thrombus  (history of LV thrombus and new acute LV thrombus) ? ?Allergies  ?Allergen Reactions  ? Celexa [Citalopram Hydrobromide] Other (See Comments)  ?  Makes the patient disoriented  ? Effexor [Venlafaxine] Other (See Comments)  ?  Altered patient's thinking process(confused)  ? Lisinopril Cough  ? ? ?Patient Measurements: ?Height: 5\' 9"  (175.3 cm) ?Weight: 92.4 kg (203 lb 11.3 oz) ?IBW/kg (Calculated) : 70.7 ?Heparin Dosing Weight: 91kg ? ?Vital Signs: ?Temp: 98 ?F (36.7 ?C) (05/07 0400) ?Temp Source: Oral (05/07 0400) ?BP: 114/90 (05/07 0600) ?Pulse Rate: 117 (05/07 0500) ? ?Labs: ?Recent Labs  ?  08/03/21 ?0343 08/04/21 ?0458 08/05/21 ?0452  ?HGB 13.0 13.8 13.9  ?HCT 37.3* 38.6* 38.9*  ?PLT 290 308 292  ?APTT 105* 105* 35  ?HEPARINUNFRC 0.92* 0.42 0.51  ?CREATININE 1.60* 1.76* 1.67*  ? ? ? ?Estimated Creatinine Clearance: 58.8 mL/min (A) (by C-G formula based on SCr of 1.67 mg/dL (H)). ? ? ?Medical History: ?Past Medical History:  ?Diagnosis Date  ? AICD (automatic cardioverter/defibrillator) present 07/03/2017  ? Anxiety   ? CHF (congestive heart failure) ([place])   ? Essential hypertension   ? History of alcohol abuse   ? a. Sober since Feb 2016.  ? 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 Documented n [place]  ? NICM (nonischemic cardiomyopathy) ([place])   ? a. 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  ? ? ?Assessment: 51 year old male with history of mural thrombus on chronic apixaban, now with new acute LV thrombus. New orders to change to IV heparin. CBC currently within normal limits.  ?Last apixaban dose was am 4/28 > heparin  started in afternoon for planned cath 5/1 ? ?Heparin level at goal this am x2, apixaban now likely washed out. CBC  stable, no bleeding noted.  ? ?Goal of Therapy:  ?Heparin level 0.3-0.7 units/ml ?Monitor platelets by anticoagulation protocol: Yes ?  ?Plan:  ?Continue IV heparin at 1200 units/hr  ?Daily heparin level and CBC. ? ?Erin Hearing PharmD., BCPS ?Clinical Pharmacist ?08/05/2021 8:28 AM ? ?

## 2021-08-05 NOTE — Progress Notes (Signed)
? ? Advanced Heart Failure Rounding Note ? ?PCP-Cardiologist: Dr. Haroldine Laws  ? ?Subjective:   ? ?4/26: Admitted w/ CS + marked volume overload. Echo severe BiV failure EF 15% + large LV thrombus. Placed on milrinone and diuresed w/ IV Lasix.  ?5/1: Cath with normal coronary arteries. Low output (CI 1.4) and high filling pressures (PCWP 30). Moved back to ICU. Milrinone increased. IV Lasix restarted. VAD w/u initiated.  ? ?Remains on milrinone 0.375 and IV lasix. Co-ox 58% -> 48%. SCr stable at 1.7. Started on abx for LLL PNA on CXR.  ? ?Sluggish diuresis. Weight unchanged over last 4 days. (Down 16 pounds from admit) CVP 9-10.  ? ?Feels that breathing and cough are getting better. No orthopnea or PND. No CP.  ? ? ?Objective:   ?Weight Range: ?92.4 kg ?Body mass index is 30.08 kg/m?.  ? ?Vital Signs:   ?Temp:  [98 ?F (36.7 ?C)-98.5 ?F (36.9 ?C)] 98 ?F (36.7 ?C) (05/07 0400) ?Pulse Rate:  [117-141] 117 (05/07 0500) ?Resp:  [9-27] 17 (05/07 0600) ?BP: (108-128)/(76-97) 114/90 (05/07 0600) ?SpO2:  [92 %-99 %] 96 % (05/07 0500) ?Weight:  [92.4 kg] 92.4 kg (05/07 0600) ?Last BM Date : 08/04/21 ? ?Weight change: ?Filed Weights  ? 08/02/21 0500 08/04/21 0700 08/05/21 0600  ?Weight: 92.7 kg 92.2 kg 92.4 kg  ? ? ?Intake/Output:  ? ?Intake/Output Summary (Last 24 hours) at 08/05/2021 1028 ?Last data filed at 08/05/2021 0900 ?Gross per 24 hour  ?Intake 2633.3 ml  ?Output 1650 ml  ?Net 983.3 ml  ? ? ? ?Physical Exam  ?  ?General:  Sitting in chair. No respiratory difficutly ?HEENT: normal ?Neck: supple. JVP to jaw Carotids 2+ bilat; no bruits. No lymphadenopathy or thryomegaly appreciated. ?Cor: Regualr tach. + s3 ?Lungs: clear ?Abdomen: soft, nontender, nondistended. No hepatosplenomegaly. No bruits or masses. Good bowel sounds. ?Extremities: no cyanosis, clubbing, rash, edema ?Neuro: alert & orientedx3, cranial nerves grossly intact. moves all 4 extremities w/o difficulty. Affect pleasant ? ? ?Telemetry  ? ?Sinus tach 115-130   Personally reviewed ? ?Labs  ?  ?CBC ?Recent Labs  ?  08/04/21 ?0458 08/05/21 ?0452  ?WBC 12.0* 13.2*  ?NEUTROABS 8.6* 10.3*  ?HGB 13.8 13.9  ?HCT 38.6* 38.9*  ?MCV 88.5 88.6  ?PLT 308 292  ? ? ?Basic Metabolic Panel ?Recent Labs  ?  08/04/21 ?0458 08/05/21 ?0452  ?NA 132* 132*  ?K 4.2 3.7  ?CL 90* 90*  ?CO2 30 31  ?GLUCOSE 96 110*  ?BUN 41* 46*  ?CREATININE 1.76* 1.67*  ?CALCIUM 9.9 9.8  ?MG 2.3 2.3  ? ? ?Liver Function Tests ?No results for input(s): AST, ALT, ALKPHOS, BILITOT, PROT, ALBUMIN in the last 72 hours. ? ?No results for input(s): LIPASE, AMYLASE in the last 72 hours. ?Cardiac Enzymes ?No results for input(s): CKTOTAL, CKMB, CKMBINDEX, TROPONINI in the last 72 hours. ? ?BNP: ?BNP (last 3 results) ?Recent Labs  ?  04/06/21 ?1121 07/05/21 ?[id number] 07/26/21 ?1243  ?BNP 42.4 3,398.7* 1,528.0*  ? ? ? ?ProBNP (last 3 results) ?No results for input(s): PROBNP in the last 8760 hours. ? ? ?D-Dimer ?No results for input(s): DDIMER in the last 72 hours. ?Hemoglobin A1C ?No results for input(s): HGBA1C in the last 72 hours. ? ?Fasting Lipid Panel ?No results for input(s): CHOL, HDL, LDLCALC, TRIG, CHOLHDL, LDLDIRECT in the last 72 hours. ? ?Thyroid Function Tests ?No results for input(s): TSH, T4TOTAL, T3FREE, THYROIDAB in the last 72 hours. ? ?Invalid input(s): FREET3 ? ?Other results: ? ? ?  Imaging  ? ? ?VAS US DOPPLER PRE VAD ? ?Result Date: 08/04/2021 ?PERIOPERATIVE VASCULAR EVALUATION Patient Name:  Robert Booth  Date of Exam:   08/04/2021 Medical Rec #: [id number]      Accession #:    [id number] Date of Birth: [birth year]     Patient Gender: M Patient Age:   51 years Exam Location:  [facility] Procedure:      VAS US DOPPLER PRE VAD Referring Phys: Quillian Quince Keoki Mchargue --------------------------------------------------------------------------------  Indications:            Pre Op LVAD workup. Risk Factors:           Hypertension, PAD. Other Factors:          ICD. CHF, NICM. OSA. History of ETOH abuse. Vascular  Interventions: Bilateral transmetatarsal amputations. Limitations:            Line/bandages right neck, tachycardia Comparison Study:       No prior study on file Performing Technologist: Sharion Dove RVS  Examination Guidelines: A complete evaluation includes B-mode imaging, spectral Doppler, color Doppler, and power Doppler as needed of all accessible portions of each vessel. Bilateral testing is considered an integral part of a complete examination. Limited examinations for reoccurring indications may be performed as noted.  Right Carotid Findings: +----------+--------+--------+--------+------------+------------------+           PSV cm/sEDV cm/sStenosisDescribe    Comments           +----------+--------+--------+--------+------------+------------------+ CCA Prox  36      10                          intimal thickening +----------+--------+--------+--------+------------+------------------+ CCA Distal38      12                          intimal thickening +----------+--------+--------+--------+------------+------------------+ ICA Prox  28      13              heterogenous                   +----------+--------+--------+--------+------------+------------------+ ICA Distal44      20                                             +----------+--------+--------+--------+------------+------------------+ ECA       28      6                                              +----------+--------+--------+--------+------------+------------------+ +----------+--------+-------+------------+------------+           PSV cm/sEDV cmsDescribe    Arm Pressure +----------+--------+-------+------------+------------+ Subclavian               Not assessed             +----------+--------+-------+------------+------------+ +---------+--------+--+--------+-+---------+ VertebralPSV cm/s19EDV cm/s7Antegrade +---------+--------+--+--------+-+---------+ Left Carotid Findings:  +----------+--------+--------+--------+------------+--------+           PSV cm/sEDV cm/sStenosisDescribe    Comments +----------+--------+--------+--------+------------+--------+ CCA Prox  53      11              heterogenous         +----------+--------+--------+--------+------------+--------+ CCA Distal59      27  heterogenous         +----------+--------+--------+--------+------------+--------+ ICA Prox  17      8               heterogenous         +----------+--------+--------+--------+------------+--------+ ICA Distal55      27                                   +----------+--------+--------+--------+------------+--------+ ECA       21      5                                    +----------+--------+--------+--------+------------+--------+ +----------+--------+--------+--------+------------+ SubclavianPSV cm/sEDV cm/sDescribeArm Pressure +----------+--------+--------+--------+------------+           61                      121          +----------+--------+--------+--------+------------+ +---------+--------+--+--------+--+---------+ VertebralPSV cm/s32EDV cm/s13Antegrade +---------+--------+--+--------+--+---------+  ABI Findings: +---------+------------------+-----+-----------+------------+ Right    Rt Pressure (mmHg)IndexWaveform   Comment      +---------+------------------+-----+-----------+------------+ Brachial                                   restricted   +---------+------------------+-----+-----------+------------+ PTA      118               0.98 multiphasic             +---------+------------------+-----+-----------+------------+ DP       115               0.95 multiphasic             +---------+------------------+-----+-----------+------------+ Great Toe                                  Transmet amp +---------+------------------+-----+-----------+------------+  +---------+------------------+-----+-----------+------------+ Left     Lt Pressure (mmHg)IndexWaveform   Comment      +---------+------------------+-----+-----------+------------+ Brachial 121                    multiphasic             +---------+------------------+-----+-----------+------------+ PTA      109               0.90 multiphasic             +---------+------------------+-----+-----------+------------

## 2021-08-05 NOTE — Progress Notes (Signed)
Patient ID: Emmert Nichter, male   DOB: Aug 10, 1970, 51 y.o.   MRN: [id number] ? ? ? ?Progress Note from the Palliative Medicine Team at [facility] ? ? ?Patient Name: Emily Kreller        ?Date: 08/05/2021 ?DOB: 13-Jun-1970  Age: 51 y.o. MRN#: [id number] ?Attending Physician: Jolaine Artist, MD ?Primary Care Physician: Charlott Rakes, MD ?Admit Date: 07/26/2021 ? ? ?Medical records reviewed  ? ?51 y.o. male   admitted on 07/26/2021 with  ?HFrEF, NICM, [employer] ICD, ETOH, NSVT, gout, LV mural thrombus, and CKD Stage IIIa. 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  ?  ?Over the last few years EF was down to 15% and had improved to 35-40% back in 2016. He stopped taking his medications and EF went back down to 15%.  ?  ?He was seen in the HF clinic 07/05/21. Multiple runs of sustained VT on device check. Mexiletine increased to 300 mg BID. BNP significantly increased (42, >3,000 in 2 months). Scr above baseline at 2.3. Furosemide increased to 40 mg BID.  ? ?Presented to HF clinic for routine f/u. Complaining of fatigue, dyspnea at rest and hemoptysis. Heart Logic Score 76. Admitted from HF clinic with suspected lowput  HF and volume overload. K 6.1, lactic acid 2.9, creatinine 2.3, AST 93 ALT 205, and WBC 15.  ?  ?ICD interrogation: Heart Logic Index 76 (started trending up first week of April), thoracic impedance down, activity level 1.2 hrs a day, one 13 second run NSVT since last device check. ?  ?Patient faces treatment option decisions (to include advanced therapies/this LVAD), advanced directive decisions and anticipatory care needs. ? ?Patient has been reluctant and unsure of decision to move forward with LVAD work-up however today he tells heart failure team that he wishes to move forward. ? ?LVAD work-up unfortunately revealed the patient would not tolerate fad due to severe right ventricle dysfunction.  Plan is to begin to wean milrinone, if patient cannot stabilize off inotropes further  discussion regarding comfort measures will be necessary. ? ? ?I spoke to [place] at the bedside as a follow up for palliative medicine needs and emotional support. ? ?Leaman clearly verbalizes the seriousness of his current medical situation and the difficult decisions he will be facing in the near future.  He hopes to make the most of "what ever time" he has left. ?He shares his appreciation for his perceived full life and many opportunities to travel. ? ?Plan of care ?-DNR/DNI--documented today after detailed conversation with patient ?-Continue with medical management, patient hopes for ongoing quality of life ?-Schedule a family meeting in hopes of furthering discussion regarding neck steps in transition of care. ? ? ?Discussed with patient the importance of continued conversation with his  family and their  medical providers regarding overall plan of care and treatment options,  ensuring decisions are within the context of the patients values and GOCs. ? ?I left a message for both his sisters and await callback ? ?Questions and concerns addressed   ? ?PMT will continue to support holistically ? ? ?Wadie Lessen NP  ?Palliative Medicine Team Team Phone # [phone] ?Pager [phone] ?  ?

## 2021-08-06 ENCOUNTER — Other Ambulatory Visit (HOSPITAL_COMMUNITY): Payer: Self-pay

## 2021-08-06 DIAGNOSIS — I5023 Acute on chronic systolic (congestive) heart failure: Secondary | ICD-10-CM | POA: Diagnosis not present

## 2021-08-06 DIAGNOSIS — Z515 Encounter for palliative care: Secondary | ICD-10-CM | POA: Diagnosis not present

## 2021-08-06 DIAGNOSIS — Z66 Do not resuscitate: Secondary | ICD-10-CM | POA: Diagnosis not present

## 2021-08-06 LAB — CBC WITH DIFFERENTIAL/PLATELET
Abs Immature Granulocytes: 0.13 10*3/uL — ABNORMAL HIGH (ref 0.00–0.07)
Basophils Absolute: 0.1 10*3/uL (ref 0.0–0.1)
Basophils Relative: 1 %
Eosinophils Absolute: 0.1 10*3/uL (ref 0.0–0.5)
Eosinophils Relative: 1 %
HCT: 37.8 % — ABNORMAL LOW (ref 39.0–52.0)
Hemoglobin: 13.4 g/dL (ref 13.0–17.0)
Immature Granulocytes: 1 %
Lymphocytes Relative: 12 %
Lymphs Abs: 1.7 10*3/uL (ref 0.7–4.0)
MCH: 31.6 pg (ref 26.0–34.0)
MCHC: 35.4 g/dL (ref 30.0–36.0)
MCV: 89.2 fL (ref 80.0–100.0)
Monocytes Absolute: 1.3 10*3/uL — ABNORMAL HIGH (ref 0.1–1.0)
Monocytes Relative: 10 %
Neutro Abs: 10.6 10*3/uL — ABNORMAL HIGH (ref 1.7–7.7)
Neutrophils Relative %: 75 %
Platelets: 306 10*3/uL (ref 150–400)
RBC: 4.24 MIL/uL (ref 4.22–5.81)
RDW: 15.3 % (ref 11.5–15.5)
WBC: 14 10*3/uL — ABNORMAL HIGH (ref 4.0–10.5)
nRBC: 0 % (ref 0.0–0.2)

## 2021-08-06 LAB — COOXEMETRY PANEL
Carboxyhemoglobin: 1.2 % (ref 0.5–1.5)
Carboxyhemoglobin: 1.2 % (ref 0.5–1.5)
Methemoglobin: <0.7 % (ref 0.0–1.5)
Methemoglobin: 0.7 % (ref 0.0–1.5)
O2 Saturation: 64.6 %
O2 Saturation: 42.5 %
Total hemoglobin: 13.7 g/dL (ref 12.0–16.0)
Total hemoglobin: 14.5 g/dL (ref 12.0–16.0)

## 2021-08-06 LAB — BASIC METABOLIC PANEL
Anion gap: 11 (ref 5–15)
BUN: 49 mg/dL — ABNORMAL HIGH (ref 6–20)
CO2: 29 mmol/L (ref 22–32)
Calcium: 9.6 mg/dL (ref 8.9–10.3)
Chloride: 93 mmol/L — ABNORMAL LOW (ref 98–111)
Creatinine, Ser: 1.66 mg/dL — ABNORMAL HIGH (ref 0.61–1.24)
GFR, Estimated: 50 mL/min — ABNORMAL LOW (ref >60)
Glucose, Bld: 77 mg/dL (ref 70–99)
Potassium: 3.8 mmol/L (ref 3.5–5.1)
Sodium: 133 mmol/L — ABNORMAL LOW (ref 135–145)

## 2021-08-06 LAB — MAGNESIUM: Magnesium: 2.1 mg/dL (ref 1.7–2.4)

## 2021-08-06 LAB — HEPARIN LEVEL (UNFRACTIONATED): Heparin Unfractionated: 0.65 IU/mL (ref 0.30–0.70)

## 2021-08-06 LAB — PROCALCITONIN: Procalcitonin: 0.26 ng/mL

## 2021-08-06 MED ORDER — IVABRADINE HCL 5 MG PO TABS
5.0000 mg | ORAL_TABLET | Freq: Two times a day (BID) | ORAL | Status: DC
  Administered 2021-08-06 – 2021-08-16 (×20): 5 mg via ORAL
  Filled 2021-08-06 (×22): qty 1

## 2021-08-06 MED ORDER — APIXABAN 5 MG PO TABS
5.0000 mg | ORAL_TABLET | Freq: Two times a day (BID) | ORAL | Status: DC
  Administered 2021-08-06 – 2021-08-16 (×21): 5 mg via ORAL
  Filled 2021-08-06 (×21): qty 1

## 2021-08-06 NOTE — Plan of Care (Signed)
?  Problem: Cardiac: ?Goal: Ability to achieve and maintain adequate cardiopulmonary perfusion will improve ?Outcome: Not Progressing ?  ?Problem: Elimination: ?Goal: Will not experience complications related to bowel motility ?Outcome: Progressing ?  ?Problem: Pain Managment: ?Goal: General experience of comfort will improve ?Outcome: Progressing ?  ?Problem: Activity: ?Goal: Risk for activity intolerance will decrease ?Outcome: Not Progressing ?  ?Problem: Education: ?Goal: Ability to demonstrate management of disease process will improve ?Outcome: Progressing ?  ?

## 2021-08-06 NOTE — Care Management (Signed)
A lady named Marylene Land called about patient, said she was his wife and wanted information about the patient. I ask the patient about having a wife, he told me he was separated for two years and did not want Marylene Land to have any information about him. No information given to Marylene Land and password was setup on acct. ? ?Judeth Horn, RN ? ?

## 2021-08-06 NOTE — Progress Notes (Signed)
Nutrition Follow-up ? ?DOCUMENTATION CODES:  ? ?Not applicable ? ?INTERVENTION:  ? ?Recommend fluid restriction; add 1800 mL fluid restriction for now ? ?Continue MVI ? ?Continue 30 ml ProSource Plus BID, each supplement provides 100 kcals and 15 grams protein.  ? ? ?NUTRITION DIAGNOSIS:  ? ?Increased nutrient needs related to chronic illness (advanced CHF, LVAD work-up) as evidenced by estimated needs. ? ?Being addressed via supplements ? ?GOAL:  ? ?Patient will meet greater than or equal to 90% of their needs ? ?Progressing ? ?MONITOR:  ? ?PO intake, Labs, Weight trends ? ?REASON FOR ASSESSMENT:  ? ?Consult ?LVAD Eval ? ?ASSESSMENT:  ? ?51 yo male admitted with acute on chronic systolic CHF with cardiogenic shock, AKI on CKD with hyperkalemia on admission PMH includes CHF, PAD s/p bilateral TMA, OSA, Pt with hx of EtOH abuse but no EtOH since 2021 ? ?Pt discussed during ICU rounds. Pt is not a candidate for LVAD placement due to severe RV dysfunction; pt is not a transplant candidate.  ?Palliative Care following; pt is DNR/DNI ?Weaning milrinone as able. If unable to wean off,  ? ?Recorded po intake mostly 75% of meals ? ?Weight stable over the last several days around 92 kg despite being on lasix.  ?Persistent hyponatremia. UOP 2.6 L in 4 hours 1.2 mL/kg/hr. ? ?Labs: sodium 132 (L) ?Meds: lasix, KCl, MVI with Minerals ? ? ?Diet Order:   ?Diet Order   ? ?       ?  Diet Heart Room service appropriate? Yes; Fluid consistency: Thin; Fluid restriction: 1800 mL Fluid  Diet effective now       ?  ? ?  ?  ? ?  ? ? ?EDUCATION NEEDS:  ? ?Education needs have been addressed ? ?Skin:  Skin Assessment: Reviewed RN Assessment ? ?Last BM:  5/7 ? ?Height:  ? ?Ht Readings from Last 1 Encounters:  ?07/27/21 5\' 9"  (1.753 m)  ? ? ?Weight:  ? ?Wt Readings from Last 1 Encounters:  ?08/06/21 92.2 kg  ? ? ?BMI:  Body mass index is 30.02 kg/m?. ? ?Estimated Nutritional Needs:  ? ?Kcal:  2200-2400 kcals ? ?Protein:  115-130 g ? ?Fluid:   1.8 L ? ? ?10/06/21 MS, RDN, LDN, CNSC ?Registered Dietitian III ?Clinical Nutrition ?RD Pager and On-Call Pager Number Located in [place]  ? ?

## 2021-08-06 NOTE — Progress Notes (Signed)
? ? Advanced Heart Failure Rounding Note ? ?PCP-Cardiologist: Dr. Haroldine Laws  ? ?Subjective:   ? ?4/26: Admitted w/ CS + marked volume overload. Echo severe BiV failure EF 15% + large LV thrombus. Placed on milrinone and diuresed w/ IV Lasix.  ?5/1: Cath with normal coronary arteries. Low output (CI 1.4) and high filling pressures (PCWP 30). Moved back to ICU. Milrinone increased. IV Lasix restarted. VAD w/u initiated.  ? ?Remains on milrinone 0.375 and IV lasix. Co-ox 65% Remains tachycardic. Weight unchanged.  ? ?Denies CP, SOB, orthopnea or PND.  ? ?Objective:   ?Weight Range: ?92.2 kg ?Body mass index is 30.02 kg/m?.  ? ?Vital Signs:   ?Temp:  [97.6 ?F (36.4 ?C)-98.1 ?F (36.7 ?C)] 97.6 ?F (36.4 ?C) (05/08 0804) ?Pulse Rate:  [113-121] 117 (05/08 0600) ?Resp:  [13-21] 14 (05/08 0600) ?BP: (108-129)/(78-101) 112/78 (05/08 0600) ?SpO2:  [90 %-99 %] 97 % (05/08 0600) ?Weight:  [92.2 kg] 92.2 kg (05/08 0600) ?Last BM Date : 08/05/21 ? ?Weight change: ?Filed Weights  ? 08/04/21 0700 08/05/21 0600 08/06/21 0600  ?Weight: 92.2 kg 92.4 kg 92.2 kg  ? ? ?Intake/Output:  ? ?Intake/Output Summary (Last 24 hours) at 08/06/2021 0846 ?Last data filed at 08/06/2021 0600 ?Gross per 24 hour  ?Intake 1575.56 ml  ?Output 2550 ml  ?Net -974.44 ml  ? ? ? ?Physical Exam  ?  ?General:  Sitting in chair No resp difficulty ?HEENT: normal ?Neck: supple. JVP 7-8. Carotids 2+ bilat; no bruits. No lymphadenopathy or thryomegaly appreciated. ?Cor: PMI nondisplaced. Regular tachy + s3 ?Lungs: decreased at bases ?Abdomen: soft, nontender, nondistended. No hepatosplenomegaly. No bruits or masses. Good bowel sounds. ?Extremities: no cyanosis, clubbing, rash, edema ?Neuro: alert & orientedx3, cranial nerves grossly intact. moves all 4 extremities w/o difficulty. Affect pleasant ? ?Telemetry  ? ?Sinus tach 115-130 Personally reviewed ? ?Labs  ?  ?CBC ?Recent Labs  ?  08/05/21 ?0452 08/06/21 ?0501  ?WBC 13.2* 14.0*  ?NEUTROABS 10.3* 10.6*  ?HGB 13.9  13.4  ?HCT 38.9* 37.8*  ?MCV 88.6 89.2  ?PLT 292 306  ? ? ?Basic Metabolic Panel ?Recent Labs  ?  08/04/21 ?0458 08/05/21 ?0452 08/06/21 ?0501  ?NA 132* 132*  --   ?K 4.2 3.7  --   ?CL 90* 90*  --   ?CO2 30 31  --   ?GLUCOSE 96 110*  --   ?BUN 41* 46*  --   ?CREATININE 1.76* 1.67*  --   ?CALCIUM 9.9 9.8  --   ?MG 2.3 2.3 2.1  ? ? ?Liver Function Tests ?No results for input(s): AST, ALT, ALKPHOS, BILITOT, PROT, ALBUMIN in the last 72 hours. ? ?No results for input(s): LIPASE, AMYLASE in the last 72 hours. ?Cardiac Enzymes ?No results for input(s): CKTOTAL, CKMB, CKMBINDEX, TROPONINI in the last 72 hours. ? ?BNP: ?BNP (last 3 results) ?Recent Labs  ?  04/06/21 ?1121 07/05/21 ?[id number] 07/26/21 ?1243  ?BNP 42.4 3,398.7* 1,528.0*  ? ? ? ?ProBNP (last 3 results) ?No results for input(s): PROBNP in the last 8760 hours. ? ? ?D-Dimer ?No results for input(s): DDIMER in the last 72 hours. ?Hemoglobin A1C ?No results for input(s): HGBA1C in the last 72 hours. ? ?Fasting Lipid Panel ?No results for input(s): CHOL, HDL, LDLCALC, TRIG, CHOLHDL, LDLDIRECT in the last 72 hours. ? ?Thyroid Function Tests ?No results for input(s): TSH, T4TOTAL, T3FREE, THYROIDAB in the last 72 hours. ? ?Invalid input(s): FREET3 ? ?Other results: ? ? ?Imaging  ? ? ?No results found. ? ? ?  Medications:   ? ? ?Scheduled Medications: ? (feeding supplement) PROSource Plus  30 mL Oral BID BM  ? atorvastatin  20 mg Oral Daily  ? Chlorhexidine Gluconate Cloth  6 each Topical Daily  ? digoxin  0.0625 mg Oral Daily  ? furosemide  80 mg Intravenous BID  ? gabapentin  600 mg Oral BID  ? mexiletine  300 mg Oral BID  ? multivitamin with minerals  1 tablet Oral Daily  ? pantoprazole  40 mg Oral Daily  ? potassium chloride  40 mEq Oral BID  ? QUEtiapine  100 mg Oral QHS  ? sertraline  200 mg Oral Daily  ? sodium chloride flush  10-40 mL Intracatheter Q12H  ? spironolactone  12.5 mg Oral Daily  ? ? ?Infusions: ? sodium chloride    ? sodium chloride 10 mL/hr at 07/30/21  2224  ? sodium chloride Stopped (08/04/21 1814)  ? ceFEPime (MAXIPIME) IV Stopped (08/05/21 2335)  ? heparin 1,200 Units/hr (08/06/21 0600)  ? milrinone 0.375 mcg/kg/min (08/06/21 0600)  ? ? ?PRN Medications: ?sodium chloride, acetaminophen, guaiFENesin-dextromethorphan, menthol-cetylpyridinium, ondansetron (ZOFRAN) IV, sodium chloride flush ? ? ? ?Patient Profile  ? ?51 y/o male with severe systolic HF due to NICM. EF 15-20%. Presented to clinic with low output symptoms and marked fluid overload. Not responding to oral diuretics.  ?  ?ICD interrogated. No VT but HL score 77.  ?  ?Admitted to ICU. Lactate 2.9. Echo w/ large LV thrombus, EF 15%, RV mod reduced, severe TR   ? ?Assessment/Plan  ? ?1. Acute on chronic systolic HF >>Cardiogenic Shock  ?- NICM. [employer] ICD ?- Cath 2016 - no CAD. 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  ?- Echo 10/2016 EF 10-15%. LV dysfunction after stopping HF meds. ?- Echo 03/01/21 EF 20-25% RV moderately reduced ?- cMRI 2/19 EF 19%. RV mildly decreased .  ?- CPX 11/20 with mild functional limitation; no clear HF limitation despite EF<20% ?- admitted with cardiogenic shock and marked fluid overload. Initial lactate 2.9. Started on Milrinone ?- Echo 4/23 EF 15%, + LV thrombus, RV mod reduced, severe TR  ?- R/LHC on 0.125 of milrinone w/ low output (CI 1.4) and high filling pressers, normal cors. Milrinone increased back to 0.25 ?- Co-ox improved on milrinone 0.375 (65%) but remains tachycardic. Volume status ok  ?- Not candidate for advanced therapies or home inotropes. Begin milrinone wean  ?- Off GDMT due to AKI/Hyperkalemia/shock.  ?- Continue digoxin and spiro ?- He is end-stage. Not transplant candidate with social issues. Likely would not tolerate VAD due to severe RV dysfunction  ?- Palliative Care involved. If fails milrinone wean will need Hospice Care. We have discussed this.  ?  ?2. LV mural thrombus  ?- He has been off warfarin ?- No thrombus on echo 12/22.  ?- Echo this admit w/ LV  thrombus.  ?- Off Eliquis ->  Continue heparin  ?- No obvious bleeding ?- Not candidate for advanced therapies. Switch back to Eliquis.  ?  ?3. H/o ETOH abuse ?- previously heavy drinker . No ETOH since 2021.  ?  ?4. VT 07/08/17  ?- He is s/p [employer] ICD 07/03/17. ?- Multiple episodes sustained VT on device check 06/621. Mexiletine increased to 300 mg BID. ?- No VT currently ?- Continue mexiletine at current dose. ?- Keep K > 4.0 Mg > 2.0  ?  ?5. 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 ?- Continues to struggle with balance/mobility.  ? ?6. Persistent cough with  LLL PNA on CT ?- PCT 0.3 ?- cefepime started 5/6  ?  ?7. Anxiety/Depression ?- Follows with [facility] [place]. ?  ?8. OSA ?- Has not tolerated CPAP ? ?9. Hemoptysis ?- Hgb stable at 13.4 ?- likely 2/2 pulmonary edema +/- LL PNA ?  ?10. AKI on CKD:  ?-Creatinine baseline previously 1.2-1.4  ?-Creatinine 2.34>>> stable 1.6-1.7 range ?-Likely Cardiorenal  ?-Continue to support output w/ milrinone ?-follow BMP w/ diuresis  ?  ?11. Hyperkalemia  ?- K 6.1 on admit resolved w/ Lokelma ? ?12. Tachycardia ?- sinus tach  ?- poor prognostic factor ?- hopefully will improve with milrinone wean  ? ? ?Length of Stay: 11 ? ?Glori Bickers, MD  ?08/06/2021, 8:46 AM ? ?Advanced Heart Failure Team ?Pager [phone] (M-F; 7a - 5p)  ?Please contact [facility] for night-coverage after hours (5p -7a ) and weekends on [email or web address] ? ? ? ? ? ? ?

## 2021-08-06 NOTE — Progress Notes (Signed)
ANTICOAGULATION CONSULT NOTE  ? ?Pharmacy Consult for heparin > apixaban ?Indication:  LV thrombus  (history of LV thrombus and new acute LV thrombus) ? ?Allergies  ?Allergen Reactions  ? Celexa [Citalopram Hydrobromide] Other (See Comments)  ?  Makes the patient disoriented  ? Effexor [Venlafaxine] Other (See Comments)  ?  Altered patient's thinking process(confused)  ? Lisinopril Cough  ? ? ?Patient Measurements: ?Height: 5\' 9"  (175.3 cm) ?Weight: 92.2 kg (203 lb 4.2 oz) ?IBW/kg (Calculated) : 70.7 ?Heparin Dosing Weight: 91kg ? ?Vital Signs: ?Temp: 98.4 ?F (36.9 ?C) (05/08 1140) ?Temp Source: Oral (05/08 [id number]) ?BP: 114/99 (05/08 1000) ?Pulse Rate: 115 (05/08 1000) ? ?Labs: ?Recent Labs  ?  08/04/21 ?0458 08/05/21 ?0452 08/05/21 ?1322 08/06/21 ?0501  ?HGB 13.8 13.9  --  13.4  ?HCT 38.6* 38.9*  --  37.8*  ?PLT 308 292  --  306  ?APTT 105* 35  --   --   ?HEPARINUNFRC 0.42 0.51 0.63 0.65  ?CREATININE 1.76* 1.67*  --   --   ? ? ? ?Estimated Creatinine Clearance: 58.7 mL/min (A) (by C-G formula based on SCr of 1.67 mg/dL (H)). ? ? ?Medical History: ?Past Medical History:  ?Diagnosis Date  ? AICD (automatic cardioverter/defibrillator) present 07/03/2017  ? Anxiety   ? CHF (congestive heart failure) ([place])   ? Essential hypertension   ? History of alcohol abuse   ? a. Sober since Feb 2016.  ? 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 Documented n [place]  ? NICM (nonischemic cardiomyopathy) ([place])   ? a. 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  ? ? ?Assessment: 51 year old male with history of mural thrombus on chronic apixaban, now with new acute LV thrombus. New orders to change to IV heparin. CBC currently within normal limits.  ?Last apixaban dose was am 4/28 > heparin  started in afternoon for planned cath 5/1 ? ?Heparin level at  goal this am 0.65 on heparin drip rate 1200 uts/hr ?No more procedures planned > change back to apixaban. Cr > 1.5, wt > 60kg, age < 74yo ?CBC stable, no bleeding noted.  ? ?Goal of Therapy:  ?Heparin level 0.3-0.7 units/ml ?Monitor platelets by anticoagulation protocol: Yes ?  ?Plan:  ?Stop heparin drip  ?Start apixaban 5mg  BID ?Monitor s/s bleeding  ? ? ?Bonnita Nasuti Pharm.D. CPP, BCPS ?Clinical Pharmacist ?[phone] ?08/06/2021 11:54 AM  ? ? ?

## 2021-08-06 NOTE — TOC Benefit Eligibility Note (Signed)
Patient Advocate Encounter ?  ?Insurance verification completed.   ?  ?The patient is currently admitted and upon discharge could be taking Corlanor 5 mg. ?  ?The current 30 day co-pay is, $4.  ? ?The patient is insured through [employer]. ? ? ?   ?

## 2021-08-07 DIAGNOSIS — I5023 Acute on chronic systolic (congestive) heart failure: Secondary | ICD-10-CM | POA: Diagnosis not present

## 2021-08-07 LAB — MAGNESIUM: Magnesium: 2.1 mg/dL (ref 1.7–2.4)

## 2021-08-07 LAB — BASIC METABOLIC PANEL
Anion gap: 11 (ref 5–15)
BUN: 47 mg/dL — ABNORMAL HIGH (ref 6–20)
CO2: 27 mmol/L (ref 22–32)
Calcium: 9.6 mg/dL (ref 8.9–10.3)
Chloride: 91 mmol/L — ABNORMAL LOW (ref 98–111)
Creatinine, Ser: 1.57 mg/dL — ABNORMAL HIGH (ref 0.61–1.24)
GFR, Estimated: 53 mL/min — ABNORMAL LOW (ref >60)
Glucose, Bld: 158 mg/dL — ABNORMAL HIGH (ref 70–99)
Potassium: 4 mmol/L (ref 3.5–5.1)
Sodium: 129 mmol/L — ABNORMAL LOW (ref 135–145)

## 2021-08-07 LAB — CBC WITH DIFFERENTIAL/PLATELET
Abs Immature Granulocytes: 0.09 10*3/uL — ABNORMAL HIGH (ref 0.00–0.07)
Basophils Absolute: 0.1 10*3/uL (ref 0.0–0.1)
Basophils Relative: 1 %
Eosinophils Absolute: 0.2 10*3/uL (ref 0.0–0.5)
Eosinophils Relative: 1 %
HCT: 37.6 % — ABNORMAL LOW (ref 39.0–52.0)
Hemoglobin: 13.4 g/dL (ref 13.0–17.0)
Immature Granulocytes: 1 %
Lymphocytes Relative: 14 %
Lymphs Abs: 1.8 10*3/uL (ref 0.7–4.0)
MCH: 31.7 pg (ref 26.0–34.0)
MCHC: 35.6 g/dL (ref 30.0–36.0)
MCV: 88.9 fL (ref 80.0–100.0)
Monocytes Absolute: 0.9 10*3/uL (ref 0.1–1.0)
Monocytes Relative: 7 %
Neutro Abs: 9.8 10*3/uL — ABNORMAL HIGH (ref 1.7–7.7)
Neutrophils Relative %: 76 %
Platelets: 317 10*3/uL (ref 150–400)
RBC: 4.23 MIL/uL (ref 4.22–5.81)
RDW: 15 % (ref 11.5–15.5)
WBC: 12.8 10*3/uL — ABNORMAL HIGH (ref 4.0–10.5)
nRBC: 0 % (ref 0.0–0.2)

## 2021-08-07 LAB — COOXEMETRY PANEL
Carboxyhemoglobin: 1.1 % (ref 0.5–1.5)
Methemoglobin: <0.7 % (ref 0.0–1.5)
O2 Saturation: 50.5 %
Total hemoglobin: 17.1 g/dL — ABNORMAL HIGH (ref 12.0–16.0)

## 2021-08-07 LAB — FACTOR 5 LEIDEN

## 2021-08-07 NOTE — Progress Notes (Addendum)
? ? Advanced Heart Failure Rounding Note ? ?PCP-Cardiologist: Dr. Haroldine Laws  ? ?Subjective:   ? ?4/26: Admitted w/ CS + marked volume overload. Echo severe BiV failure EF 15% + large LV thrombus. Placed on milrinone and diuresed w/ IV Lasix.  ?5/1: Cath with normal coronary arteries. Low output (CI 1.4) and high filling pressures (PCWP 30). Moved back to ICU. Milrinone increased. IV Lasix restarted. VAD w/u initiated.  ?05/08: Milrinone decreased to 0.25 ? ?CO-OX 51% on milrinone 0.25. ? ?CVP 4.  ? ?Continues with sinus tach, rates in 110s-120s ? ?No complaints. Denies dyspnea. Eager to get up and walk halls.  ? ?Objective:   ?Weight Range: ?97.7 kg ?Body mass index is 31.81 kg/m?.  ? ?Vital Signs:   ?Temp:  [97 ?F (36.1 ?C)-98.4 ?F (36.9 ?C)] 97.7 ?F (36.5 ?C) (05/09 [id number]) ?Pulse Rate:  [108-134] 120 (05/09 0805) ?Resp:  [10-28] 20 (05/09 0805) ?BP: (94-124)/(73-105) 122/101 (05/09 0805) ?SpO2:  [93 %-98 %] 95 % (05/09 0805) ?Weight:  [97.7 kg] 97.7 kg (05/09 0435) ?Last BM Date : 08/06/21 ? ?Weight change: ?Filed Weights  ? 08/05/21 0600 08/06/21 0600 08/07/21 0435  ?Weight: 92.4 kg 92.2 kg 97.7 kg  ? ? ?Intake/Output:  ? ?Intake/Output Summary (Last 24 hours) at 08/07/2021 [id number] ?Last data filed at 08/07/2021 0600 ?Gross per 24 hour  ?Intake 1148.78 ml  ?Output 2501 ml  ?Net -1352.22 ml  ? ? ?Physical Exam  ?  ?General:  No distress. Sitting up in chair. ?HEENT: normal ?Neck: supple. no JVD. Carotids 2+ bilat; no bruits.  ?Cor: PMI nondisplaced. Regular rate & rhythm, tachy. No rubs, gallops or murmurs. ?Lungs: diminished in bases ?Abdomen: soft, nontender, nondistended. No hepatosplenomegaly.  ?Extremities: no cyanosis, clubbing, rash, edema ?Neuro: alert & orientedx3, cranial nerves grossly intact. moves all 4 extremities w/o difficulty. Affect pleasant ? ? ?Telemetry  ? ?Sinus tach 110s -120s ? ?Labs  ?  ?CBC ?Recent Labs  ?  08/06/21 ?0501 08/07/21 ?0445  ?WBC 14.0* 12.8*  ?NEUTROABS 10.6* 9.8*  ?HGB 13.4 13.4   ?HCT 37.8* 37.6*  ?MCV 89.2 88.9  ?PLT 306 317  ? ?Basic Metabolic Panel ?Recent Labs  ?  08/06/21 ?0501 08/06/21 ?1801 08/07/21 ?0445  ?NA  --  133* 129*  ?K  --  3.8 4.0  ?CL  --  93* 91*  ?CO2  --  29 27  ?GLUCOSE  --  77 158*  ?BUN  --  49* 47*  ?CREATININE  --  1.66* 1.57*  ?CALCIUM  --  9.6 9.6  ?MG 2.1  --  2.1  ? ?Liver Function Tests ?No results for input(s): AST, ALT, ALKPHOS, BILITOT, PROT, ALBUMIN in the last 72 hours. ? ?No results for input(s): LIPASE, AMYLASE in the last 72 hours. ?Cardiac Enzymes ?No results for input(s): CKTOTAL, CKMB, CKMBINDEX, TROPONINI in the last 72 hours. ? ?BNP: ?BNP (last 3 results) ?Recent Labs  ?  04/06/21 ?1121 07/05/21 ?[id number] 07/26/21 ?1243  ?BNP 42.4 3,398.7* 1,528.0*  ? ? ?ProBNP (last 3 results) ?No results for input(s): PROBNP in the last 8760 hours. ? ? ?D-Dimer ?No results for input(s): DDIMER in the last 72 hours. ?Hemoglobin A1C ?No results for input(s): HGBA1C in the last 72 hours. ? ?Fasting Lipid Panel ?No results for input(s): CHOL, HDL, LDLCALC, TRIG, CHOLHDL, LDLDIRECT in the last 72 hours. ? ?Thyroid Function Tests ?No results for input(s): TSH, T4TOTAL, T3FREE, THYROIDAB in the last 72 hours. ? ?Invalid input(s): FREET3 ? ?Other results: ? ? ?Imaging  ? ? ?  No results found. ? ? ?Medications:   ? ? ?Scheduled Medications: ? (feeding supplement) PROSource Plus  30 mL Oral BID BM  ? apixaban  5 mg Oral BID  ? atorvastatin  20 mg Oral Daily  ? Chlorhexidine Gluconate Cloth  6 each Topical Daily  ? digoxin  0.0625 mg Oral Daily  ? furosemide  80 mg Intravenous BID  ? gabapentin  600 mg Oral BID  ? ivabradine  5 mg Oral BID WC  ? mexiletine  300 mg Oral BID  ? multivitamin with minerals  1 tablet Oral Daily  ? pantoprazole  40 mg Oral Daily  ? potassium chloride  40 mEq Oral BID  ? QUEtiapine  100 mg Oral QHS  ? sertraline  200 mg Oral Daily  ? sodium chloride flush  10-40 mL Intracatheter Q12H  ? spironolactone  12.5 mg Oral Daily  ? ? ?Infusions: ? sodium  chloride    ? sodium chloride 10 mL/hr at 07/30/21 2224  ? sodium chloride Stopped (08/04/21 1814)  ? ceFEPime (MAXIPIME) IV Stopped (08/06/21 2222)  ? milrinone 0.25 mcg/kg/min (08/07/21 0600)  ? ? ?PRN Medications: ?sodium chloride, acetaminophen, guaiFENesin-dextromethorphan, menthol-cetylpyridinium, ondansetron (ZOFRAN) IV, sodium chloride flush ? ? ? ?Patient Profile  ? ?51 y/o male with severe systolic HF due to NICM. EF 15-20%. Presented to clinic with low output symptoms and marked fluid overload. Not responding to oral diuretics.  ?  ?ICD interrogated. No VT but HL score 77.  ?  ?Admitted to ICU. Lactate 2.9. Echo w/ large LV thrombus, EF 15%, RV mod reduced, severe TR   ? ?Assessment/Plan  ? ?1. Acute on chronic systolic HF >>Cardiogenic Shock  ?- NICM. [employer] ICD ?- Cath 2016 - no CAD. 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  ?- Echo 10/2016 EF 10-15%. LV dysfunction after stopping HF meds. ?- Echo 03/01/21 EF 20-25% RV moderately reduced ?- cMRI 2/19 EF 19%. RV mildly decreased .  ?- CPX 11/20 with mild functional limitation; no clear HF limitation despite EF<20% ?- admitted with cardiogenic shock and marked fluid overload. Initial lactate 2.9. Started on Milrinone ?- Echo 4/23 EF 15%, + LV thrombus, RV mod reduced, severe TR  ?- R/LHC on 0.125 of milrinone w/ low output (CI 1.4) and high filling pressers, normal cors. Milrinone increased back to 0.25 ?- Not candidate for advanced therapies or home inotropes.  ?- CO-OX marginal on 0.25 milrinone. Will not try to wean further today. ?- CVP 4. Stop IV lasix.  ?- Continue digoxin 0.0625 ?- Continue spiro 12.5 daily ?- Continue corlanor 5 mg BID ?- He is end-stage. Not transplant candidate with social issues. Likely would not tolerate VAD due to severe RV dysfunction  ?- Palliative Care involved. If fails milrinone wean will need Hospice Care. We have discussed this.  ?  ?2. LV mural thrombus  ?- He has been off warfarin ?- No thrombus on echo 12/22.  ?- Echo this  admit w/ LV thrombus.  ?- Off Eliquis ->  Continue heparin  ?- No obvious bleeding ?- Not candidate for advanced therapies. Switch back to Eliquis.  ?  ?3. H/o ETOH abuse ?- previously heavy drinker . No ETOH since 2021.  ?  ?4. VT 07/08/17  ?- He is s/p [employer] ICD 07/03/17. ?- Multiple episodes sustained VT on device check 06/621. Mexiletine increased to 300 mg BID. ?- No VT currently ?- Continue mexiletine at current dose. ?- Keep K > 4.0 Mg > 2.0  ?  ?5. 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 ?- Continues to struggle with balance/mobility.  ? ?6. Persistent cough with LLL PNA on CT ?- PCT 0.3 ?- cefepime started 5/6  ?  ?7. Anxiety/Depression ?- Follows with [facility] [place]. ?  ?8. OSA ?- Has not tolerated CPAP ? ?9. Hemoptysis ?- Hgb stable at 13.4 ?- likely 2/2 pulmonary edema +/- LL PNA ?  ?10. AKI on CKD:  ?-Creatinine baseline previously 1.2-1.4  ?-Creatinine 2.34>>> stable 1.6-1.7 range ?-Likely Cardiorenal  ?-Continue to support output w/ milrinone ?-follow BMP ?  ?11. Hyperkalemia  ?- K 6.1 on admit resolved w/ Lokelma ? ?12. Tachycardia ?- sinus tach  ?- poor prognostic factor ?- hopefully will improve with milrinone wean. Now on corlanor ? ?  ?Transfer to [facility]. ? ?Consult CR. ? ?Length of Stay: 12 ? ?FINCH, LINDSAY N, PA-C  ?08/07/2021, 9:22 AM ? ?Advanced Heart Failure Team ?Pager [phone] (M-F; 7a - 5p)  ?Please contact [facility] for night-coverage after hours (5p -7a ) and weekends on [email or web address] ? ?Patient seen and examined with the above-signed Advanced Practice Provider and/or Housestaff. I personally reviewed laboratory data, imaging studies and relevant notes. I independently examined the patient and formulated the important aspects of the plan. I have edited the note to reflect any of my changes or salient points. I have personally discussed the plan with the patient and/or family. ? ?Remains on milrinone. Down to 0.25. Co-ox marginal. Remains tachy. CVP  5-6. Denies SOB, orthopnea or PND ? ?General:  Well appearing. No resp difficulty ?HEENT: normal ?Neck: supple. no JVD. Carotids 2+ bilat; no bruits. No lymphadenopathy or thryomegaly appreciated. ?Cor: PMI

## 2021-08-07 NOTE — Progress Notes (Incomplete)
{  Select Note:[id number]} 

## 2021-08-08 DIAGNOSIS — Z7189 Other specified counseling: Secondary | ICD-10-CM

## 2021-08-08 DIAGNOSIS — Z515 Encounter for palliative care: Secondary | ICD-10-CM | POA: Diagnosis not present

## 2021-08-08 DIAGNOSIS — I5023 Acute on chronic systolic (congestive) heart failure: Secondary | ICD-10-CM | POA: Diagnosis not present

## 2021-08-08 DIAGNOSIS — I428 Other cardiomyopathies: Secondary | ICD-10-CM | POA: Diagnosis not present

## 2021-08-08 LAB — COOXEMETRY PANEL
Carboxyhemoglobin: 1.3 % (ref 0.5–1.5)
Methemoglobin: <0.7 % (ref 0.0–1.5)
O2 Saturation: 54.4 %
Total hemoglobin: 11.6 g/dL — ABNORMAL LOW (ref 12.0–16.0)

## 2021-08-08 LAB — CBC WITH DIFFERENTIAL/PLATELET
Abs Immature Granulocytes: 0.06 10*3/uL (ref 0.00–0.07)
Basophils Absolute: 0.1 10*3/uL (ref 0.0–0.1)
Basophils Relative: 1 %
Eosinophils Absolute: 0.2 10*3/uL (ref 0.0–0.5)
Eosinophils Relative: 2 %
HCT: 35.4 % — ABNORMAL LOW (ref 39.0–52.0)
Hemoglobin: 12.5 g/dL — ABNORMAL LOW (ref 13.0–17.0)
Immature Granulocytes: 1 %
Lymphocytes Relative: 12 %
Lymphs Abs: 1.3 10*3/uL (ref 0.7–4.0)
MCH: 31.4 pg (ref 26.0–34.0)
MCHC: 35.3 g/dL (ref 30.0–36.0)
MCV: 88.9 fL (ref 80.0–100.0)
Monocytes Absolute: 0.9 10*3/uL (ref 0.1–1.0)
Monocytes Relative: 9 %
Neutro Abs: 8.1 10*3/uL — ABNORMAL HIGH (ref 1.7–7.7)
Neutrophils Relative %: 75 %
Platelets: 297 10*3/uL (ref 150–400)
RBC: 3.98 MIL/uL — ABNORMAL LOW (ref 4.22–5.81)
RDW: 15.1 % (ref 11.5–15.5)
WBC: 10.6 10*3/uL — ABNORMAL HIGH (ref 4.0–10.5)
nRBC: 0 % (ref 0.0–0.2)

## 2021-08-08 LAB — BASIC METABOLIC PANEL
Anion gap: 10 (ref 5–15)
BUN: 46 mg/dL — ABNORMAL HIGH (ref 6–20)
CO2: 28 mmol/L (ref 22–32)
Calcium: 9.5 mg/dL (ref 8.9–10.3)
Chloride: 94 mmol/L — ABNORMAL LOW (ref 98–111)
Creatinine, Ser: 1.51 mg/dL — ABNORMAL HIGH (ref 0.61–1.24)
GFR, Estimated: 56 mL/min — ABNORMAL LOW (ref >60)
Glucose, Bld: 92 mg/dL (ref 70–99)
Potassium: 3.6 mmol/L (ref 3.5–5.1)
Sodium: 132 mmol/L — ABNORMAL LOW (ref 135–145)

## 2021-08-08 LAB — MAGNESIUM: Magnesium: 2.2 mg/dL (ref 1.7–2.4)

## 2021-08-08 MED ORDER — MILRINONE LACTATE IN DEXTROSE 20-5 MG/100ML-% IV SOLN
0.1250 ug/kg/min | INTRAVENOUS | Status: DC
  Administered 2021-08-08: 0.125 ug/kg/min via INTRAVENOUS
  Filled 2021-08-08: qty 100

## 2021-08-08 MED ORDER — POTASSIUM CHLORIDE CRYS ER 20 MEQ PO TBCR
40.0000 meq | EXTENDED_RELEASE_TABLET | Freq: Once | ORAL | Status: AC
  Administered 2021-08-08: 40 meq via ORAL
  Filled 2021-08-08: qty 2

## 2021-08-08 MED ORDER — ALTEPLASE 2 MG IJ SOLR
2.0000 mg | Freq: Once | INTRAMUSCULAR | Status: AC
  Administered 2021-08-08: 2 mg
  Filled 2021-08-08: qty 2

## 2021-08-08 MED ORDER — FUROSEMIDE 10 MG/ML IJ SOLN
80.0000 mg | Freq: Once | INTRAMUSCULAR | Status: AC
  Administered 2021-08-08: 80 mg via INTRAVENOUS
  Filled 2021-08-08: qty 8

## 2021-08-08 NOTE — Plan of Care (Signed)

## 2021-08-08 NOTE — Progress Notes (Addendum)
? ? Advanced Heart Failure Rounding Note ? ?PCP-Cardiologist: Dr. Haroldine Laws  ? ?Subjective:   ? ?4/26: Admitted w/ CS + marked volume overload. Echo severe BiV failure EF 15% + large LV thrombus. Placed on milrinone and diuresed w/ IV Lasix.  ?5/1: Cath with normal coronary arteries. Low output (CI 1.4) and high filling pressures (PCWP 30). Moved back to ICU. Milrinone increased. IV Lasix restarted. VAD w/u initiated.  ?05/08: Milrinone decreased to 0.25 ? ?Remains on Milrinone 0.25. Co-ox 54%.  ? ?800 cc in UOP yesterday.  ? ?Scr stable, 1.66>>1.57>>1.51  ? ?CVP 15. ? ?Feeling better. Denies resting dyspnea.  ? ? ?Objective:   ?Weight Range: ?93.5 kg ?Body mass index is 30.44 kg/m?.  ? ?Vital Signs:   ?Temp:  [97.7 ?F (36.5 ?C)-98.7 ?F (37.1 ?C)] 98.7 ?F (37.1 ?C) (05/10 0741) ?Pulse Rate:  [110-119] 113 (05/09 1542) ?Resp:  [13-20] 13 (05/10 0741) ?BP: (109-125)/(87-97) 125/97 (05/10 0741) ?SpO2:  [95 %-97 %] 97 % (05/10 0741) ?Weight:  [93.5 kg] 93.5 kg (05/10 0500) ?Last BM Date : 08/07/21 ? ?Weight change: ?Filed Weights  ? 08/06/21 0600 08/07/21 0435 08/08/21 0500  ?Weight: 92.2 kg 97.7 kg 93.5 kg  ? ? ?Intake/Output:  ? ?Intake/Output Summary (Last 24 hours) at 08/08/2021 0952 ?Last data filed at 08/08/2021 0300 ?Gross per 24 hour  ?Intake 590.1 ml  ?Output 800 ml  ?Net -209.9 ml  ? ? ?Physical Exam  ?  ?General:  Well appearing. No respiratory difficulty ?HEENT: normal ?Neck: supple. JVP 10. Carotids 2+ bilat; no bruits. No lymphadenopathy or thyromegaly appreciated. ?Cor: PMI nondisplaced. Regular rhythm, tachy rate. No rubs, gallops or murmurs. ?Lungs: clear ?Abdomen: soft, nontender, nondistended. No hepatosplenomegaly. No bruits or masses. Good bowel sounds. ?Extremities: no cyanosis, clubbing, rash, edema + RUE PICC  ?Neuro: alert & oriented x 3, cranial nerves grossly intact. moves all 4 extremities w/o difficulty. Affect pleasant. ? ? ? ? ?Telemetry  ? ?Sinus tach 110s  ? ?Labs  ?  ?CBC ?Recent Labs   ?  08/07/21 ?0445 08/08/21 ?0451  ?WBC 12.8* 10.6*  ?NEUTROABS 9.8* 8.1*  ?HGB 13.4 12.5*  ?HCT 37.6* 35.4*  ?MCV 88.9 88.9  ?PLT 317 297  ? ?Basic Metabolic Panel ?Recent Labs  ?  08/07/21 ?0445 08/08/21 ?0451  ?NA 129* 132*  ?K 4.0 3.6  ?CL 91* 94*  ?CO2 27 28  ?GLUCOSE 158* 92  ?BUN 47* 46*  ?CREATININE 1.57* 1.51*  ?CALCIUM 9.6 9.5  ?MG 2.1 2.2  ? ?Liver Function Tests ?No results for input(s): AST, ALT, ALKPHOS, BILITOT, PROT, ALBUMIN in the last 72 hours. ? ?No results for input(s): LIPASE, AMYLASE in the last 72 hours. ?Cardiac Enzymes ?No results for input(s): CKTOTAL, CKMB, CKMBINDEX, TROPONINI in the last 72 hours. ? ?BNP: ?BNP (last 3 results) ?Recent Labs  ?  04/06/21 ?1121 07/05/21 ?[id number] 07/26/21 ?1243  ?BNP 42.4 3,398.7* 1,528.0*  ? ? ?ProBNP (last 3 results) ?No results for input(s): PROBNP in the last 8760 hours. ? ? ?D-Dimer ?No results for input(s): DDIMER in the last 72 hours. ?Hemoglobin A1C ?No results for input(s): HGBA1C in the last 72 hours. ? ?Fasting Lipid Panel ?No results for input(s): CHOL, HDL, LDLCALC, TRIG, CHOLHDL, LDLDIRECT in the last 72 hours. ? ?Thyroid Function Tests ?No results for input(s): TSH, T4TOTAL, T3FREE, THYROIDAB in the last 72 hours. ? ?Invalid input(s): FREET3 ? ?Other results: ? ? ?Imaging  ? ? ?No results found. ? ? ?Medications:   ? ? ?Scheduled Medications: ? (  feeding supplement) PROSource Plus  30 mL Oral BID BM  ? apixaban  5 mg Oral BID  ? atorvastatin  20 mg Oral Daily  ? Chlorhexidine Gluconate Cloth  6 each Topical Daily  ? digoxin  0.0625 mg Oral Daily  ? gabapentin  600 mg Oral BID  ? ivabradine  5 mg Oral BID WC  ? mexiletine  300 mg Oral BID  ? multivitamin with minerals  1 tablet Oral Daily  ? pantoprazole  40 mg Oral Daily  ? QUEtiapine  100 mg Oral QHS  ? sertraline  200 mg Oral Daily  ? sodium chloride flush  10-40 mL Intracatheter Q12H  ? spironolactone  12.5 mg Oral Daily  ? ? ?Infusions: ? sodium chloride 10 mL/hr at 08/07/21 2246  ? sodium  chloride 10 mL/hr at 07/30/21 2224  ? sodium chloride Stopped (08/04/21 1814)  ? ceFEPime (MAXIPIME) IV 2 g (08/07/21 2301)  ? milrinone 0.25 mcg/kg/min (08/08/21 0524)  ? ? ?PRN Medications: ?sodium chloride, acetaminophen, guaiFENesin-dextromethorphan, menthol-cetylpyridinium, ondansetron (ZOFRAN) IV, sodium chloride flush ? ? ? ?Patient Profile  ? ?51 y/o male with severe systolic HF due to NICM. EF 15-20%. Presented to clinic with low output symptoms and marked fluid overload. Not responding to oral diuretics.  ?  ?ICD interrogated. No VT but HL score 77.  ?  ?Admitted to ICU. Lactate 2.9. Echo w/ large LV thrombus, EF 15%, RV mod reduced, severe TR   ? ?Assessment/Plan  ? ?1. Acute on chronic systolic HF >>Cardiogenic Shock  ?- NICM. [employer] ICD ?- Cath 2016 - no CAD. 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  ?- Echo 10/2016 EF 10-15%. LV dysfunction after stopping HF meds. ?- Echo 03/01/21 EF 20-25% RV moderately reduced ?- cMRI 2/19 EF 19%. RV mildly decreased .  ?- CPX 11/20 with mild functional limitation; no clear HF limitation despite EF<20% ?- admitted with cardiogenic shock and marked fluid overload. Initial lactate 2.9. Started on Milrinone ?- Echo 4/23 EF 15%, + LV thrombus, RV mod reduced, severe TR  ?- R/LHC on 0.125 of milrinone w/ low output (CI 1.4) and high filling pressers, normal cors. Milrinone increased back to 0.25 ?- Not candidate for advanced therapies or home inotropes.  ?- CO-OX marginal, 54% on 0.25 milrinone. Will wean milrinone further to 0.125. ?- CVP 15. Give 80 mg lasix IV X 1. ?- Continue digoxin 0.0625 ?- Continue spiro 12.5 daily ?- Continue corlanor 5 mg BID ?- He is end-stage. Not transplant candidate with social issues. Likely would not tolerate VAD due to severe RV dysfunction  ?- Palliative Care involved. If fails milrinone wean will need Hospice Care. We have discussed this.  ?  ?2. LV mural thrombus  ?- He has been off warfarin ?- No thrombus on echo 12/22.  ?- Echo this admit w/  LV thrombus.  ?- Continue Eliquis   ?- No obvious bleeding ?  ?3. H/o ETOH abuse ?- previously heavy drinker . No ETOH since 2021.  ?  ?4. VT 07/08/17  ?- He is s/p [employer] ICD 07/03/17. ?- Multiple episodes sustained VT on device check 06/621. Mexiletine increased to 300 mg BID. ?- No VT currently ?- Continue mexiletine at current dose. ?- Keep K > 4.0 Mg > 2.0  ?  ?5. 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 ?- Continues to struggle with balance/mobility.  ? ?6. Persistent cough with LLL PNA on CT ?- PCT 0.3 ?- cefepime started 5/6  ?  ?7. Anxiety/Depression ?-  Follows with [facility] [place]. ?  ?8. OSA ?- Has not tolerated CPAP ? ?9. Hemoptysis ?- Hgb stable at 13.4 ?- likely 2/2 pulmonary edema +/- LL PNA ?  ?10. AKI on CKD:  ?-Creatinine baseline previously 1.2-1.4  ?-Creatinine 2.34>>> stable 1.6-1.7 range ?-Likely Cardiorenal  ?-Continue to support output w/ milrinone ?-follow BMP ?  ?11. Hyperkalemia  ?- K 6.1 on admit resolved w/ Lokelma ? ?12. Tachycardia ?- sinus tach  ?- poor prognostic factor ?- hopefully will improve with milrinone wean. Now on corlanor ? ?  ? ?Length of Stay: 13 ? ?Lyda Jester, PA-C  ?08/08/2021, 9:52 AM ? ?Advanced Heart Failure Team ?Pager [phone] (M-F; 7a - 5p)  ?Please contact [facility] for night-coverage after hours (5p -7a ) and weekends on [email or web address] ? ?Patient seen and examined with the above-signed Advanced Practice Provider and/or Housestaff. I personally reviewed laboratory data, imaging studies and relevant notes. I independently examined the patient and formulated the important aspects of the plan. I have edited the note to reflect any of my changes or salient points. I have personally discussed the plan with the patient and/or family. ? ?Remains on milrinone 0.25. Co-ox marginal. Unable to get CVP (tried personally). + DOE. No orthopnea/PND Remains tachycardic ? ?General:  Well appearing. No resp difficulty ?HEENT:  normal ?Neck: supple. JVP 7-8 Carotids 2+ bilat; no bruits. No lymphadenopathy or thryomegaly appreciated. ?Cor:. Regular tachy No rubs, gallops or murmurs. ?Lungs: clear ?Abdomen: soft, nontender, nondistended. No

## 2021-08-08 NOTE — Progress Notes (Signed)
?Patient ID: Robert Booth, male   DOB: 30-Apr-1970, 51 y.o.   MRN: [id number] ? ? ? ?Progress Note from the Palliative Medicine Team at [facility] ? ? ?Patient Name: Robert Booth        ?Date: 08/08/2021 ?DOB: 1970-12-18  Age: 51 y.o. MRN#: [id number] ?Attending Physician: Robert Artist, MD ?Primary Care Physician: Robert Rakes, MD ?Admit Date: 07/26/2021 ? ? ?Medical records reviewed  ? ?51 y.o. male   admitted on 07/26/2021 with  ?HFrEF, NICM, [employer] ICD, ETOH, NSVT, gout, LV mural thrombus, and CKD Stage IIIa. 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  ?  ?Over the last few years EF was down to 15% and had improved to 35-40% back in 2016. He stopped taking his medications and EF went back down to 15%.  ?  ?He was seen in the HF clinic 07/05/21. Multiple runs of sustained VT on device check. Mexiletine increased to 300 mg BID. BNP significantly increased (42, >3,000 in 2 months). Scr above baseline at 2.3. Furosemide increased to 40 mg BID.  ? ?Presented to HF clinic for routine f/u. Complaining of fatigue, dyspnea at rest and hemoptysis. Heart Logic Score 76. Admitted from HF clinic with suspected lowput  HF and volume overload. K 6.1, lactic acid 2.9, creatinine 2.3, AST 93 ALT 205, and WBC 15.  ?  ?ICD interrogation: Heart Logic Index 76 (started trending up first week of April), thoracic impedance down, activity level 1.2 hrs a day, one 13 second run NSVT since last device check. ?  ?Patient faces treatment option decisions (to include advanced therapies/this LVAD), advanced directive decisions and anticipatory care needs. ? ? ?LVAD work-up unfortunately revealed the patient would not tolerate fad due to severe right ventricle dysfunction.  Plan is to begin to wean milrinone, if patient cannot stabilize off inotropes further discussion regarding comfort measures will be necessary. ? ? ?I spoke to Robert Booth at the bedside as a follow up for palliative medicine needs and emotional  support and met with his sister/Robert Booth at the bedside.  His Sister Robert Booth was unable to participate in the call today via phone.  I encouraged her to call me later at her convenience. ? ?Education offered on the natural trajectory and expectations of a patient with end-stage heart failure..    ? ?Robert Booth clearly verbalizes the seriousness of his current medical situation and the difficult decisions he will be facing in the near future.  He hopes to make the most of "what ever time" he has left. ? ?Hope is that patient will gradually wean off milrinone and stabilize giving him the opportunity to discharge from the hospital for continued quality of life. ? ?Plan of care ?-DNR/DNI-- ?-Continue with medical management, patient hopes for ongoing quality of life ?-When ready for discharge will return to his sister's home/Robert Booth for a few weeks ?-Patient is open to hospice services when eligible  ?- Patient wishes to secure [facility], he wishes for his Sister Robert Booth to be that person.  I will put a request in to spiritual care to assist with that document. ? ?     MOST form completed and copy placed in hard chart   ? ?The trajectory for Robert Booth is unknown.  Understandably he  hoping to stabilize for continued quality of life.  His significant heart disease is such that he may decompensate quickly.  Education offered on hospice benefit; philosophy and eligibility. ? ? ?Discussed with patient the importance of continued conversation  with his  family and their  medical providers regarding overall plan of care and treatment options,  ensuring decisions are within the context of the patients values and GOCs. ? ?Questions and concerns addressed.  ? ?This nurse practitioner informed  the patient/family  that I will be out of the hospital until Monday morning.  If the patient is still hospitalized I will follow-up at that time. ? ?Call palliative medicine team phone # [phone] with questions or concerns.  ? ?PMT will  continue to support holistically   I placed a call to Robert Booth licensed clinical social worker with advanced heart failure to update her on the situation also, left message and await callback. ? ? ?Robert Lessen NP  ?Palliative Medicine Team Team Phone # [phone] ?Pager [phone] ?  ?

## 2021-08-09 DIAGNOSIS — I5023 Acute on chronic systolic (congestive) heart failure: Secondary | ICD-10-CM | POA: Diagnosis not present

## 2021-08-09 DIAGNOSIS — Z515 Encounter for palliative care: Secondary | ICD-10-CM | POA: Diagnosis not present

## 2021-08-09 DIAGNOSIS — I428 Other cardiomyopathies: Secondary | ICD-10-CM | POA: Diagnosis not present

## 2021-08-09 DIAGNOSIS — Z7189 Other specified counseling: Secondary | ICD-10-CM | POA: Diagnosis not present

## 2021-08-09 LAB — CBC WITH DIFFERENTIAL/PLATELET
Abs Immature Granulocytes: 0.05 10*3/uL (ref 0.00–0.07)
Basophils Absolute: 0.1 10*3/uL (ref 0.0–0.1)
Basophils Relative: 1 %
Eosinophils Absolute: 0.2 10*3/uL (ref 0.0–0.5)
Eosinophils Relative: 3 %
HCT: 33.1 % — ABNORMAL LOW (ref 39.0–52.0)
Hemoglobin: 11.7 g/dL — ABNORMAL LOW (ref 13.0–17.0)
Immature Granulocytes: 1 %
Lymphocytes Relative: 13 %
Lymphs Abs: 1.2 10*3/uL (ref 0.7–4.0)
MCH: 31.7 pg (ref 26.0–34.0)
MCHC: 35.3 g/dL (ref 30.0–36.0)
MCV: 89.7 fL (ref 80.0–100.0)
Monocytes Absolute: 0.9 10*3/uL (ref 0.1–1.0)
Monocytes Relative: 11 %
Neutro Abs: 6.3 10*3/uL (ref 1.7–7.7)
Neutrophils Relative %: 71 %
Platelets: 290 10*3/uL (ref 150–400)
RBC: 3.69 MIL/uL — ABNORMAL LOW (ref 4.22–5.81)
RDW: 15.3 % (ref 11.5–15.5)
WBC: 8.8 10*3/uL (ref 4.0–10.5)
nRBC: 0 % (ref 0.0–0.2)

## 2021-08-09 LAB — BASIC METABOLIC PANEL
Anion gap: 12 (ref 5–15)
BUN: 47 mg/dL — ABNORMAL HIGH (ref 6–20)
CO2: 25 mmol/L (ref 22–32)
Calcium: 9.2 mg/dL (ref 8.9–10.3)
Chloride: 95 mmol/L — ABNORMAL LOW (ref 98–111)
Creatinine, Ser: 1.59 mg/dL — ABNORMAL HIGH (ref 0.61–1.24)
GFR, Estimated: 52 mL/min — ABNORMAL LOW (ref >60)
Glucose, Bld: 130 mg/dL — ABNORMAL HIGH (ref 70–99)
Potassium: 3.4 mmol/L — ABNORMAL LOW (ref 3.5–5.1)
Sodium: 132 mmol/L — ABNORMAL LOW (ref 135–145)

## 2021-08-09 LAB — MAGNESIUM: Magnesium: 2.4 mg/dL (ref 1.7–2.4)

## 2021-08-09 LAB — COOXEMETRY PANEL
Carboxyhemoglobin: 1.2 % (ref 0.5–1.5)
Methemoglobin: 0.7 % (ref 0.0–1.5)
O2 Saturation: 57.2 %
Total hemoglobin: 12.2 g/dL (ref 12.0–16.0)

## 2021-08-09 MED ORDER — TORSEMIDE 20 MG PO TABS
40.0000 mg | ORAL_TABLET | Freq: Every day | ORAL | Status: DC
  Administered 2021-08-09 – 2021-08-11 (×3): 40 mg via ORAL
  Filled 2021-08-09 (×3): qty 2

## 2021-08-09 MED ORDER — POTASSIUM CHLORIDE CRYS ER 20 MEQ PO TBCR
30.0000 meq | EXTENDED_RELEASE_TABLET | Freq: Once | ORAL | Status: AC
  Administered 2021-08-09: 30 meq via ORAL
  Filled 2021-08-09: qty 1

## 2021-08-09 MED ORDER — SPIRONOLACTONE 25 MG PO TABS
25.0000 mg | ORAL_TABLET | Freq: Every day | ORAL | Status: DC
  Administered 2021-08-09 – 2021-08-11 (×3): 25 mg via ORAL
  Filled 2021-08-09 (×3): qty 1

## 2021-08-09 MED ORDER — DAPAGLIFLOZIN PROPANEDIOL 10 MG PO TABS
10.0000 mg | ORAL_TABLET | Freq: Every day | ORAL | Status: DC
  Administered 2021-08-10 – 2021-08-16 (×7): 10 mg via ORAL
  Filled 2021-08-09 (×7): qty 1

## 2021-08-09 MED ORDER — POTASSIUM CHLORIDE CRYS ER 20 MEQ PO TBCR
40.0000 meq | EXTENDED_RELEASE_TABLET | Freq: Once | ORAL | Status: AC
  Administered 2021-08-09: 40 meq via ORAL
  Filled 2021-08-09: qty 2

## 2021-08-09 MED ORDER — ENSURE ENLIVE PO LIQD
237.0000 mL | Freq: Two times a day (BID) | ORAL | Status: DC
  Administered 2021-08-10 – 2021-08-15 (×9): 237 mL via ORAL

## 2021-08-09 NOTE — Progress Notes (Signed)
Nutrition Follow-up ? ?DOCUMENTATION CODES:  ? ?Not applicable ? ?INTERVENTION:  ? ?- Continue MVI with minerals daily ? ?- Continue ProSource Plus 30 ml po BID, each supplement provides 100 kcal and 15 grams of protein ? ?- Encourage ongoing adequate PO intake ? ?NUTRITION DIAGNOSIS:  ? ?Increased nutrient needs related to chronic illness (advanced CHF, LVAD work-up) as evidenced by estimated needs. ? ?Ongoing, being addressed via oral nutrition supplements ? ?GOAL:  ? ?Patient will meet greater than or equal to 90% of their needs ? ?Progressing ? ?MONITOR:  ? ?PO intake, Labs, Weight trends ? ?REASON FOR ASSESSMENT:  ? ?Consult ?LVAD Eval ? ?ASSESSMENT:  ? ?51 yo male admitted with acute on chronic systolic CHF with cardiogenic shock, AKI on CKD with hyperkalemia on admission PMH includes CHF, PAD s/p bilateral TMA, OSA, Pt with hx of EtOH abuse but no EtOH since 2021. ? ?Pt is not a candidate for LVAD placement due to severe RV dysfunction; pt is not a transplant candidate. Pt is also not a candidate for home inotropes. Per HF team note, plan is to stop milrinone today. ? ?Spoke with pt at bedside. Pt in good spirits. Pt reports good appetite and eating well. He states that he is eating at least 75% of all meals and completing most meals. Pt taking ProSource Plus but does not like it. He understands the importance of taking the ProSource Plus and is willing to continue to do so. RD encouraged ongoing adequate PO intake and supplement consumption. ? ?Palliative Care team following. ? ?Admit weight: 99.6 kg ?Current weight: 93.3 kg ? ?Pt with mild pitting generalized edema and non-pitting edema to BLE. ? ?Meal Completion: 50-100% (averaging 79%) ? ?Medications reviewed and include: ProSource Plus 30 ml BID, MVI with minerals, protonix, klor-con 40 mEq once, spironolactone, torsemide, IV abx ? ?Labs reviewed: sodium 132, potassium 3.4, BUN 47, creatinine 1.59 ? ?UOP: 1130 ml x 24 hours ?I/O's: -22.5 L since  admit ? ?Diet Order:   ?Diet Order   ? ?       ?  Diet Heart Room service appropriate? Yes; Fluid consistency: Thin; Fluid restriction: 1800 mL Fluid  Diet effective now       ?  ? ?  ?  ? ?  ? ? ?EDUCATION NEEDS:  ? ?Education needs have been addressed ? ?Skin:  Skin Assessment: Reviewed RN Assessment ? ?Last BM:  08/07/21 ? ?Height:  ? ?Ht Readings from Last 1 Encounters:  ?07/27/21 5\' 9"  (1.753 m)  ? ? ?Weight:  ? ?Wt Readings from Last 1 Encounters:  ?08/09/21 93.3 kg  ? ? ?BMI:  Body mass index is 30.38 kg/m?. ? ?Estimated Nutritional Needs:  ? ?Kcal:  2200-2400 kcals ? ?Protein:  115-130 grams ? ?Fluid:  1.8 L ? ? ? ?10/09/21, MS, RD, LDN ?Inpatient Clinical Dietitian ?Please see [facility] for contact information. ? ?

## 2021-08-09 NOTE — Progress Notes (Signed)
Patient KZ:[id number] Capri      DOB: 05-08-1970      XBL:[id number] ? ? ? ?  ?Palliative Medicine Team ? ? ? ?Subjective: Bedside social visit.  ? ? ?Assessment and plan: Patient resting in recliner at time of visit. Breathing even and non-labored, no excessive secretions noted. Patient without physical or non-verbal signs of pain or discomfort at this time. Patient currently off of IV milrinone medication and states he feels no difference with it being turned off. He reports he is able to get up and walk some, clean himself up in the bathroom without issue. This RN provided space for patient to share his concerns about discharge and where he will be staying. He shared that he is open to a facility short term if it helps him get stronger and able to then return home either alone or with a family member. He does not have other concerns or needs at this time, just worried about what is to come. This RN shared that ongoing visits will occur with his permission and that [facility] will work with him to find the best discharge plan for his current situation. NP previously working with this patient is off next few days, please let this RN know if any additional needs arise. Will continue to follow for any changes or advances.  ? ? ?Thank you for allowing the Palliative Medicine Team to assist in the care of this patient. ?  ?  ?Shelda Jakes, MSN, RN ?Palliative Medicine Team ?Team Phone: [phone]  ?This phone is monitored 7a-7p, please reach out to attending physician outside of these hours for urgent needs.   ?

## 2021-08-09 NOTE — Plan of Care (Signed)

## 2021-08-09 NOTE — TOC CM/SW Note (Addendum)
3:05 pm Spoke to pt at bedside, states he is waiting and hopeful. Isidoro Donning RN3 CCM, Heart Failure TOC CM [phone]  ? ?1:02 pm HF TOC CM sent handoff to HF CSW/Unit CM to follow up tomorrow about dc disposition. Pt may need Home Hospice. Attending will place [facility] consult to arrange. Isidoro Donning RN3 CCM, Heart Failure TOC CM [phone]  ?

## 2021-08-09 NOTE — Progress Notes (Addendum)
? ? Advanced Heart Failure Rounding Note ? ?PCP-Cardiologist: Dr. Haroldine Laws  ? ?Subjective:   ? ?4/26: Admitted w/ CS + marked volume overload. Echo severe BiV failure EF 15% + large LV thrombus. Placed on milrinone and diuresed w/ IV Lasix.  ?5/1: Cath with normal coronary arteries. Low output (CI 1.4) and high filling pressures (PCWP 30). Moved back to ICU. Milrinone increased. IV Lasix restarted. VAD w/u initiated.  ?05/08: Milrinone decreased to 0.25 ?08-25-22: Milrinone deceased to 0.125  ? ?On Milrinone 0.125. Co-ox 57% today  ? ?1.1L  in UOP yesterday w/ IV Lasix. Wt down 1 lb. CVP 6-7   ? ?Scr stable, 1.66>>1.57>>1.51 >>1.59 ?K 3.4  ?Mg 2.4  ? ? ?OOB sitting up in chair. No dyspnea. Feeling decent. No complaints.  ? ? ?Objective:   ?Weight Range: ?93.3 kg ?Body mass index is 30.38 kg/m?.  ? ?Vital Signs:   ?Temp:  [97.5 ?F (36.4 ?C)-98.7 ?F (37.1 ?C)] 97.5 ?F (36.4 ?C) (05/11 0729) ?Pulse Rate:  [100] 100 (05/11 0729) ?Resp:  [13-20] 20 (05/11 0729) ?BP: (97-126)/(84-97) 112/85 (05/11 0729) ?SpO2:  [94 %-98 %] 98 % (05/11 0729) ?Weight:  [93.3 kg] 93.3 kg (05/11 0600) ?Last BM Date : 08/07/21 ? ?Weight change: ?Filed Weights  ? 08/07/21 0435 24-Aug-2021 0500 08/09/21 0600  ?Weight: 97.7 kg 93.5 kg 93.3 kg  ? ? ?Intake/Output:  ? ?Intake/Output Summary (Last 24 hours) at 08/09/2021 0825 ?Last data filed at 08/09/2021 0600 ?Gross per 24 hour  ?Intake 400 ml  ?Output 1130 ml  ?Net -730 ml  ? ? ?Physical Exam  ? ?CVP 6-7  ?General:  Well appearing sitting up in chair. No respiratory difficulty ?HEENT: normal ?Neck: supple. JVD 6 cm. Carotids 2+ bilat; no bruits. No lymphadenopathy or thyromegaly appreciated. ?Cor: PMI nondisplaced. Regular rhythm, tachy rate. No rubs, gallops or murmurs. ?Lungs: clear ?Abdomen: soft, nontender, nondistended. No hepatosplenomegaly. No bruits or masses. Good bowel sounds. ?Extremities: no cyanosis, clubbing, rash, edema, cool distal extremities, +RUE PICC  ?Neuro: alert & oriented x 3,  cranial nerves grossly intact. moves all 4 extremities w/o difficulty. Affect pleasant  ? ?Telemetry  ? ?Sinus tach 110s  ? ?Labs  ?  ?CBC ?Recent Labs  ?  08-24-2021 ?0451 08/09/21 ?[id number]  ?WBC 10.6* 8.8  ?NEUTROABS 8.1* 6.3  ?HGB 12.5* 11.7*  ?HCT 35.4* 33.1*  ?MCV 88.9 89.7  ?PLT 297 290  ? ?Basic Metabolic Panel ?Recent Labs  ?  2021-08-24 ?0451 08/09/21 ?[id number]  ?NA 132* 132*  ?K 3.6 3.4*  ?CL 94* 95*  ?CO2 28 25  ?GLUCOSE 92 130*  ?BUN 46* 47*  ?CREATININE 1.51* 1.59*  ?CALCIUM 9.5 9.2  ?MG 2.2 2.4  ? ?Liver Function Tests ?No results for input(s): AST, ALT, ALKPHOS, BILITOT, PROT, ALBUMIN in the last 72 hours. ? ?No results for input(s): LIPASE, AMYLASE in the last 72 hours. ?Cardiac Enzymes ?No results for input(s): CKTOTAL, CKMB, CKMBINDEX, TROPONINI in the last 72 hours. ? ?BNP: ?BNP (last 3 results) ?Recent Labs  ?  04/06/21 ?1121 07/05/21 ?[id number] 07/26/21 ?1243  ?BNP 42.4 3,398.7* 1,528.0*  ? ? ?ProBNP (last 3 results) ?No results for input(s): PROBNP in the last 8760 hours. ? ? ?D-Dimer ?No results for input(s): DDIMER in the last 72 hours. ?Hemoglobin A1C ?No results for input(s): HGBA1C in the last 72 hours. ? ?Fasting Lipid Panel ?No results for input(s): CHOL, HDL, LDLCALC, TRIG, CHOLHDL, LDLDIRECT in the last 72 hours. ? ?Thyroid Function Tests ?No results for input(s): TSH,  T4TOTAL, T3FREE, THYROIDAB in the last 72 hours. ? ?Invalid input(s): FREET3 ? ?Other results: ? ? ?Imaging  ? ? ?No results found. ? ? ?Medications:   ? ? ?Scheduled Medications: ? (feeding supplement) PROSource Plus  30 mL Oral BID BM  ? apixaban  5 mg Oral BID  ? atorvastatin  20 mg Oral Daily  ? Chlorhexidine Gluconate Cloth  6 each Topical Daily  ? digoxin  0.0625 mg Oral Daily  ? gabapentin  600 mg Oral BID  ? ivabradine  5 mg Oral BID WC  ? mexiletine  300 mg Oral BID  ? multivitamin with minerals  1 tablet Oral Daily  ? pantoprazole  40 mg Oral Daily  ? QUEtiapine  100 mg Oral QHS  ? sertraline  200 mg Oral Daily  ? sodium  chloride flush  10-40 mL Intracatheter Q12H  ? spironolactone  12.5 mg Oral Daily  ? ? ?Infusions: ? sodium chloride 10 mL/hr at 08/07/21 2246  ? sodium chloride 10 mL/hr at 07/30/21 2224  ? sodium chloride Stopped (08/04/21 1814)  ? ceFEPime (MAXIPIME) IV Stopped (08/08/21 2340)  ? milrinone 0.125 mcg/kg/min (08/08/21 2131)  ? ? ?PRN Medications: ?sodium chloride, acetaminophen, guaiFENesin-dextromethorphan, menthol-cetylpyridinium, ondansetron (ZOFRAN) IV, sodium chloride flush ? ? ? ?Patient Profile  ? ?51 y/o male with severe systolic HF due to NICM. EF 15-20%. Presented to clinic with low output symptoms and marked fluid overload. Not responding to oral diuretics.  ?  ?ICD interrogated. No VT but HL score 77.  ?  ?Admitted to ICU. Lactate 2.9. Echo w/ large LV thrombus, EF 15%, RV mod reduced, severe TR   ? ?Assessment/Plan  ? ?1. Acute on chronic systolic HF >>Cardiogenic Shock  ?- NICM. [employer] ICD ?- Cath 2016 - no CAD. 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  ?- Echo 10/2016 EF 10-15%. LV dysfunction after stopping HF meds. ?- Echo 03/01/21 EF 20-25% RV moderately reduced ?- cMRI 2/19 EF 19%. RV mildly decreased .  ?- CPX 11/20 with mild functional limitation; no clear HF limitation despite EF<20% ?- admitted with cardiogenic shock and marked fluid overload. Initial lactate 2.9. Started on Milrinone ?- Echo 4/23 EF 15%, + LV thrombus, RV mod reduced, severe TR  ?- R/LHC on 0.125 of milrinone w/ low output (CI 1.4) and high filling pressers, normal cors. Milrinone increased back to 0.25 ?- Not candidate for advanced therapies or home inotropes.  ?- On Milrinone 0.125. Co-ox 58%.  ?- CVP 6-7. Start torsemide 40 mg daily  ?- Continue digoxin 0.0625 ?- Increase spiro to 25 daily ?- Continue corlanor 5 mg BID ?- He is end-stage. Not transplant candidate with social issues. Likely would not tolerate VAD due to severe RV dysfunction  ?- Palliative Care involved. Stop milrinone today. If fails milrinone wean will need  Hospice Care. We have discussed this.  ?  ?2. LV mural thrombus  ?- He has been off warfarin ?- No thrombus on echo 12/22.  ?- Echo this admit w/ LV thrombus.  ?- Continue Eliquis   ?- No obvious bleeding ?  ?3. H/o ETOH abuse ?- previously heavy drinker . No ETOH since 2021.  ?  ?4. VT 07/08/17  ?- He is s/p [employer] ICD 07/03/17. ?- Multiple episodes sustained VT on device check 06/621. Mexiletine increased to 300 mg BID. ?- No VT currently ?- Continue mexiletine at current dose. ?- Keep K > 4.0 Mg > 2.0  ?  ?5. 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 ?- Continues to struggle with balance/mobility.  ? ?6. Persistent cough with LLL PNA on CT ?- PCT 0.3 ?- cefepime started 5/6  ?  ?7. Anxiety/Depression ?- Follows with [facility] [place]. ?  ?8. OSA ?- Has not tolerated CPAP ? ?9. Hemoptysis ?- Hgb stable at 12 ?- likely 2/2 pulmonary edema +/- LL PNA ?  ?10. AKI on CKD:  ?-Creatinine baseline previously 1.2-1.4  ?-Creatinine 2.34>>> stable 1.6-1.7 range ?-Likely Cardiorenal  ?-Continue to support output w/ milrinone ?-follow BMP ?  ?11. Hyperkalemia  ?- K 6.1 on admit resolved w/ Lokelma ? ?12. Tachycardia ?- sinus tach  ?- poor prognostic factor ?- hopefully will improve with milrinone wean. Now on corlanor ? ?  ? ?Length of Stay: 14 ? ?Lyda Jester, PA-C  ?08/09/2021, 8:25 AM ? ?Advanced Heart Failure Team ?Pager [phone] (M-F; 7a - 5p)  ?Please contact [facility] for night-coverage after hours (5p -7a ) and weekends on [email or web address] ? ? ?Patient seen and examined with the above-signed Advanced Practice Provider and/or Housestaff. I personally reviewed laboratory data, imaging studies and relevant notes. I independently examined the patient and formulated the important aspects of the plan. I have edited the note to reflect any of my changes or salient points. I have personally discussed the plan with the patient and/or family. ? ?Co-ox and volume status stable on milrinone  0.125. Remains tachycardic. Denies SOB, orthopnea or PND.  ? ?General:  Well appearing. No resp difficulty ?HEENT: normal ?Neck: supple. no JVD. Carotids 2+ bilat; no bruits. No lymphadenopathy or thryomegaly app

## 2021-08-10 ENCOUNTER — Other Ambulatory Visit: Payer: Self-pay | Admitting: Internal Medicine

## 2021-08-10 DIAGNOSIS — I5023 Acute on chronic systolic (congestive) heart failure: Secondary | ICD-10-CM | POA: Diagnosis not present

## 2021-08-10 LAB — COOXEMETRY PANEL
Carboxyhemoglobin: 0.3 % — ABNORMAL LOW (ref 0.5–1.5)
Carboxyhemoglobin: 1.1 % (ref 0.5–1.5)
Methemoglobin: <0.7 % (ref 0.0–1.5)
Methemoglobin: 0.8 % (ref 0.0–1.5)
O2 Saturation: 31.5 %
O2 Saturation: 43.4 %
Total hemoglobin: 13.4 g/dL (ref 12.0–16.0)
Total hemoglobin: 12.9 g/dL (ref 12.0–16.0)

## 2021-08-10 LAB — CBC WITH DIFFERENTIAL/PLATELET
Abs Immature Granulocytes: 0.07 10*3/uL (ref 0.00–0.07)
Basophils Absolute: 0.1 10*3/uL (ref 0.0–0.1)
Basophils Relative: 1 %
Eosinophils Absolute: 0.3 10*3/uL (ref 0.0–0.5)
Eosinophils Relative: 2 %
HCT: 36.1 % — ABNORMAL LOW (ref 39.0–52.0)
Hemoglobin: 12.7 g/dL — ABNORMAL LOW (ref 13.0–17.0)
Immature Granulocytes: 1 %
Lymphocytes Relative: 19 %
Lymphs Abs: 2.2 10*3/uL (ref 0.7–4.0)
MCH: 31.8 pg (ref 26.0–34.0)
MCHC: 35.2 g/dL (ref 30.0–36.0)
MCV: 90.5 fL (ref 80.0–100.0)
Monocytes Absolute: 1.2 10*3/uL — ABNORMAL HIGH (ref 0.1–1.0)
Monocytes Relative: 10 %
Neutro Abs: 7.7 10*3/uL (ref 1.7–7.7)
Neutrophils Relative %: 67 %
Platelets: 329 10*3/uL (ref 150–400)
RBC: 3.99 MIL/uL — ABNORMAL LOW (ref 4.22–5.81)
RDW: 15.3 % (ref 11.5–15.5)
WBC: 11.5 10*3/uL — ABNORMAL HIGH (ref 4.0–10.5)
nRBC: 0 % (ref 0.0–0.2)

## 2021-08-10 LAB — MAGNESIUM: Magnesium: 2.3 mg/dL (ref 1.7–2.4)

## 2021-08-10 LAB — BASIC METABOLIC PANEL
Anion gap: 10 (ref 5–15)
BUN: 58 mg/dL — ABNORMAL HIGH (ref 6–20)
CO2: 23 mmol/L (ref 22–32)
Calcium: 9.6 mg/dL (ref 8.9–10.3)
Chloride: 100 mmol/L (ref 98–111)
Creatinine, Ser: 1.9 mg/dL — ABNORMAL HIGH (ref 0.61–1.24)
GFR, Estimated: 42 mL/min — ABNORMAL LOW (ref >60)
Glucose, Bld: 99 mg/dL (ref 70–99)
Potassium: 4.7 mmol/L (ref 3.5–5.1)
Sodium: 133 mmol/L — ABNORMAL LOW (ref 135–145)

## 2021-08-10 NOTE — Progress Notes (Addendum)
? ? Advanced Heart Failure Rounding Note ? ?PCP-Cardiologist: Dr. Haroldine Laws  ? ?Subjective:   ? ?4/26: Admitted w/ CS + marked volume overload. Echo severe BiV failure EF 15% + large LV thrombus. Placed on milrinone and diuresed w/ IV Lasix.  ?5/1: Cath with normal coronary arteries. Low output (CI 1.4) and high filling pressures (PCWP 30). Moved back to ICU. Milrinone increased. IV Lasix restarted. VAD w/u initiated.  ?05/08: Milrinone decreased to 0.25 ?28-Aug-2022: Milrinone deceased to 0.125  ?08-29-22: milrinone stopped . [name] and farxiga added.  ? ? ?CO-OX 31.5%. Repeat now ? ?On Milrinone 0.125. Co-ox 57% today  ? ?Scr stable, 1.66>>1.57>>1.51 >>1.59>>1.9 ? ?Complaining of fatigue. Had episode of nausea. ? ?Objective:   ?Weight Range: ?93.3 kg ?Body mass index is 30.38 kg/m?.  ? ?Vital Signs:   ?Temp:  [97.5 ?F (36.4 ?C)-97.8 ?F (36.6 ?C)] 97.6 ?F (36.4 ?C) (05/12 [id number]) ?Pulse Rate:  [108-112] 108 (05/12 0722) ?Resp:  [14-16] 14 (05/12 0722) ?BP: (105-115)/(84-92) 105/88 (05/12 [id number]) ?SpO2:  [96 %-99 %] 99 % (05/12 0722) ?Weight:  [93.3 kg] 93.3 kg (05/12 0500) ?Last BM Date : 08/07/21 ? ?Weight change: ?Filed Weights  ? 08/27/21 0500 28-Aug-2021 0600 08/10/21 0500  ?Weight: 93.5 kg 93.3 kg 93.3 kg  ? ? ?Intake/Output:  ? ?Intake/Output Summary (Last 24 hours) at 08/10/2021 1017 ?Last data filed at 08/10/2021 [id number] ?Gross per 24 hour  ?Intake 1320 ml  ?Output 520 ml  ?Net 800 ml  ? ? ?Physical Exam  ?CVP 7-8  ?General: In the chair. No resp difficulty ?HEENT: normal ?Neck: supple. JVP 7-8 . Carotids 2+ bilat; no bruits. No lymphadenopathy or thryomegaly appreciated. ?Cor: PMI nondisplaced. Regular rate & rhythm. No rubs,  or murmurs. +S3 ?Lungs: clear ?Abdomen: soft, nontender, nondistended. No hepatosplenomegaly. No bruits or masses. Good bowel sounds.Damien Fusi PICC ?Neuro: alert & orientedx3, cranial nerves grossly intact. moves all 4 extremities w/o difficulty. Affect pleasant ? ? ?Telemetry  ? ?Sinus Tach 110s  ? ?Labs   ?  ?CBC ?Recent Labs  ?  2021/08/28 ?[id number] 08/10/21 ?[id number]  ?WBC 8.8 11.5*  ?NEUTROABS 6.3 7.7  ?HGB 11.7* 12.7*  ?HCT 33.1* 36.1*  ?MCV 89.7 90.5  ?PLT 290 329  ? ?Basic Metabolic Panel ?Recent Labs  ?  Aug 28, 2021 ?[id number] 08/10/21 ?[id number]  ?NA 132* 133*  ?K 3.4* 4.7  ?CL 95* 100  ?CO2 25 23  ?GLUCOSE 130* 99  ?BUN 47* 58*  ?CREATININE 1.59* 1.90*  ?CALCIUM 9.2 9.6  ?MG 2.4 2.3  ? ?Liver Function Tests ?No results for input(s): AST, ALT, ALKPHOS, BILITOT, PROT, ALBUMIN in the last 72 hours. ? ?No results for input(s): LIPASE, AMYLASE in the last 72 hours. ?Cardiac Enzymes ?No results for input(s): CKTOTAL, CKMB, CKMBINDEX, TROPONINI in the last 72 hours. ? ?BNP: ?BNP (last 3 results) ?Recent Labs  ?  04/06/21 ?1121 07/05/21 ?[id number] 07/26/21 ?1243  ?BNP 42.4 3,398.7* 1,528.0*  ? ? ?ProBNP (last 3 results) ?No results for input(s): PROBNP in the last 8760 hours. ? ? ?D-Dimer ?No results for input(s): DDIMER in the last 72 hours. ?Hemoglobin A1C ?No results for input(s): HGBA1C in the last 72 hours. ? ?Fasting Lipid Panel ?No results for input(s): CHOL, HDL, LDLCALC, TRIG, CHOLHDL, LDLDIRECT in the last 72 hours. ? ?Thyroid Function Tests ?No results for input(s): TSH, T4TOTAL, T3FREE, THYROIDAB in the last 72 hours. ? ?Invalid input(s): FREET3 ? ?Other results: ? ? ?Imaging  ? ? ?No results found. ? ? ?Medications:   ? ? ?  Scheduled Medications: ? apixaban  5 mg Oral BID  ? atorvastatin  20 mg Oral Daily  ? Chlorhexidine Gluconate Cloth  6 each Topical Daily  ? dapagliflozin propanediol  10 mg Oral Daily  ? digoxin  0.0625 mg Oral Daily  ? feeding supplement  237 mL Oral BID BM  ? gabapentin  600 mg Oral BID  ? ivabradine  5 mg Oral BID WC  ? mexiletine  300 mg Oral BID  ? multivitamin with minerals  1 tablet Oral Daily  ? pantoprazole  40 mg Oral Daily  ? QUEtiapine  100 mg Oral QHS  ? sertraline  200 mg Oral Daily  ? sodium chloride flush  10-40 mL Intracatheter Q12H  ? spironolactone  25 mg Oral Daily  ? torsemide  40 mg Oral  Daily  ? ? ?Infusions: ? sodium chloride 10 mL/hr at 08/07/21 2246  ? sodium chloride 10 mL/hr at 07/30/21 2224  ? sodium chloride Stopped (08/04/21 1814)  ? ? ?PRN Medications: ?sodium chloride, acetaminophen, guaiFENesin-dextromethorphan, menthol-cetylpyridinium, ondansetron (ZOFRAN) IV, sodium chloride flush ? ? ? ?Patient Profile  ? ?51 y/o male with severe systolic HF due to NICM. EF 15-20%. Presented to clinic with low output symptoms and marked fluid overload. Not responding to oral diuretics.  ?  ?ICD interrogated. No VT but HL score 77.  ?  ?Admitted to ICU. Lactate 2.9. Echo w/ large LV thrombus, EF 15%, RV mod reduced, severe TR   ? ?Assessment/Plan  ? ?1. Acute on chronic systolic HF >>Cardiogenic Shock  ?- NICM. [employer] ICD ?- Cath 2016 - no CAD. 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  ?- Echo 10/2016 EF 10-15%. LV dysfunction after stopping HF meds. ?- Echo 03/01/21 EF 20-25% RV moderately reduced ?- cMRI 2/19 EF 19%. RV mildly decreased .  ?- CPX 11/20 with mild functional limitation; no clear HF limitation despite EF<20% ?- admitted with cardiogenic shock and marked fluid overload. Initial lactate 2.9. Started on Milrinone ?- Echo 4/23 EF 15%, + LV thrombus, RV mod reduced, severe TR  ?- R/LHC on 0.125 of milrinone w/ low output (CI 1.4) and high filling pressers, normal cors. Milrinone increased back to 0.25 ?- Not candidate for advanced therapies or home inotropes.  ?- Now off milrinone. CO-OX 31.5%. Repeat now.  ?- CVP 7-8. Continue  torsemide 40 mg daily  ?- Continue digoxin 0.0625 ?- Continue spiro to 25 daily ?- Continue corlanor 5 mg BID ?- He is end-stage. Not transplant candidate with social issues. Likely would not tolerate VAD due to severe RV dysfunction. Not a candidate for home inotropes.  ?- Palliative Care involved. If repeat CO-OX low. Will advise Hospice.  ?  ?2. LV mural thrombus  ?- He has been off warfarin ?- No thrombus on echo 12/22.  ?- Echo this admit w/ LV thrombus.  ?- Continue  Eliquis   ?- No obvious bleeding ?  ?3. H/o ETOH abuse ?- previously heavy drinker . No ETOH since 2021.  ?  ?4. VT 07/08/17  ?- He is s/p [employer] ICD 07/03/17. ?- Multiple episodes sustained VT on device check 06/621. Mexiletine increased to 300 mg BID. ?-No Vt  ?- Continue mexiletine at current dose. ?- Keep K > 4.0 Mg > 2.0  ?  ?5. 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 ?- Continues to struggle with balance/mobility.  ? ?6. Persistent cough with LLL PNA on CT ?- PCT 0.3 ?- cefepime started 5/6  ?  ?7. Anxiety/Depression ?-  Follows with [facility] [place]. ?  ?8. OSA ?- Has not tolerated CPAP ? ?9. Hemoptysis ?- Hgb stable at 12 ?- likely 2/2 pulmonary edema +/- LL PNA ?  ?10. AKI on CKD:  ?-Creatinine baseline previously 1.2-1.4  ?-Creatinine 2.34>>> trending up to 1.9  ?-Likely Cardiorenal  ?  ?11. Hyperkalemia  ?- K 6.1 on admit resolved w/ Lokelma ?- Stable today.  ? ?12. Tachycardia ?- sinus tach  ?- poor prognostic factor ?- hopefully will improve with milrinone wean. Now on corlanor ? ? Repeat CO-OX now.  ? ?Length of Stay: 15 ? ?Darrick Grinder, NP  ?08/10/2021, 10:17 AM ? ?Advanced Heart Failure Team ?Pager [phone] (M-F; 7a - 5p)  ?Please contact [facility] for night-coverage after hours (5p -7a ) and weekends on [email or web address] ? ?Agree with above.  ? ?Milrinone stopped yesterday. Co-ox low. Feels more SOB ad having some ab pain. ? ?General:  Sitting in chair  ?HEENT: normal ?Neck: supple. CVP 7-8 Carotids 2+ bilat; no bruits. No lymphadenopathy or thryomegaly appreciated. ?Cor: regular tachy +s3 ?Lungs: clear ?Abdomen: soft, nontender, nondistended. No hepatosplenomegaly. No bruits or masses. Good bowel sounds. ?Extremities: no cyanosis, clubbing, rash, edema ?Neuro: alert & orientedx3, cranial nerves grossly intact. moves all 4 extremities w/o difficulty. Affect pleasant ? ?Suspect he is end-stage. Not a candidate for home inotropes or advanced therapies. Will do our best to  stabilize on orals.  If can't be stabilized then will transition to comfort care. D/w Palliative Care.  ? ?CRITICAL CARE ?Performed by: Glori Bickers ? ?Total critical care time: 35 minutes ? ?Critic

## 2021-08-10 NOTE — Evaluation (Signed)
Physical Therapy Evaluation & Discharge ?Patient Details ?Name: Robert Booth ?MRN: [id number] ?DOB: [birth year] ?Today's Date: 08/10/2021 ? ?History of Present Illness ? Pt is a 51 y.o. male who presented 07/26/21 from the HF clinic which he was at for routine f/u with pt complaining of fatigue, dyspnea at rest and hemoptysis. Pt admitted with acute on chronic systolic HF, volume overloaded, PNA, and LV thrombus. R/LHC 5/1. PMH: HFrEF, NICM, [employer] ICD, ETOH, NSVT, gout, and CKD Stage IIIa. 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  ?Clinical Impression ? Pt presents with condition above. PTA, he was most recently living in a hotel with a flat entrance, walk-in shower, and built in shower seat. He reports he is waiting on "transition housing". At baseline, pt is independent with functional mobility, intermittently using his rollator on days he has increased balance or endurance deficits. Pt reports balance and endurance deficits at baseline, which were noted during this PT eval. He reports he is currently and appears to be at his baseline, not needing physical assistance for any functional mobility as pt was able to regain his balance with reactional strategies and no intervention. Educated pt on energy conservation techniques and provided pt with handout. Educated pt on reducing sodium intake, fluid restriction, increasing frequency of short bouts of activity, and weighing self daily. All education completed and questions answered. As pt is at his baseline, PT will sign off. ?   ? ?Recommendations for follow up therapy are one component of a multi-disciplinary discharge planning process, led by the attending physician.  Recommendations may be updated based on patient status, additional functional criteria and insurance authorization. ? ?Follow Up Recommendations No PT follow up ? ?  ?Assistance Recommended at Discharge PRN  ?Patient can return home with the following ? Assistance with  cooking/housework;Assist for transportation ? ?  ?Equipment Recommendations None recommended by PT  ?Recommendations for Other Services ?    ?  ?Functional Status Assessment Patient has not had a recent decline in their functional status  ? ?  ?Precautions / Restrictions Precautions ?Precautions: Other (comment) ?Precaution Comments: watch HR ?Restrictions ?Weight Bearing Restrictions: No  ? ?  ? ?Mobility ? Bed Mobility ?Overal bed mobility: Modified Independent ?  ?  ?  ?  ?  ?  ?General bed mobility comments: Pt able to transition supine <> sit in bed with HOB elevated without assistance or difficulty. ?  ? ?Transfers ?Overall transfer level: Modified independent ?Equipment used:  (IV pole) ?  ?  ?  ?  ?  ?  ?  ?General transfer comment: Pt coming to stand from EOB and toilet without assistance or LOB, holding onto IV pole ?  ? ?Ambulation/Gait ?Ambulation/Gait assistance: Supervision ?Gait Distance (Feet): 240 Feet (x2 bouts of ~20 ft > ~240 ft) ?Assistive device: IV Pole, None ?Gait Pattern/deviations: Step-through pattern, Decreased stride length, Staggering left, Staggering right ?Gait velocity: reduced ?Gait velocity interpretation: 1.31 - 2.62 ft/sec, indicative of limited community ambulator ?  ?General Gait Details: Pt initially ambulating in the room with IV pole but quickly progressed to no UE support. Pt with intermittent staggering laterally but regains balance with step reactional strategies and no need for assistance. Supervision for safety ? ?Stairs ?  ?  ?  ?  ?  ? ?Wheelchair Mobility ?  ? ?Modified Rankin (Stroke Patients Only) ?  ? ?  ? ?Balance Overall balance assessment: Mild deficits observed, not formally tested ?  ?  ?  ?  ?  ?  ?  ?  ?  ?  ?  ?  ?  ?  ?  ?  ?  ?  ?   ? ? ? ?  Pertinent Vitals/Pain Pain Assessment ?Pain Assessment: Faces ?Faces Pain Scale: No hurt ?Pain Intervention(s): Monitored during session  ? ? ?Home Living Family/patient expects to be discharged to:: Other (Comment) ?   ?  ?  ?  ?  ?  ?  ?  ?  ?Additional Comments: Pt most recently living alone in a hotel. He reports uncertainty on his d/c plan, reporting he is waiting for "transition housing". Owns a rollator. The hotel he is currently in has a level entrance, walk-in shower, shower seat built-in, and standard toilet  ?  ?Prior Function Prior Level of Function : Independent/Modified Independent ?  ?  ?  ?  ?  ?  ?Mobility Comments: Reports he uses rollator when he is having bad balance/endurance days but otherwise walks without UE support. ?  ?  ? ? ?Hand Dominance  ?   ? ?  ?Extremity/Trunk Assessment  ? Upper Extremity Assessment ?Upper Extremity Assessment: Overall WFL for tasks assessed ?  ? ?Lower Extremity Assessment ?Lower Extremity Assessment: RLE deficits/detail;LLE deficits/detail ?RLE Deficits / Details: prior bil TMA noted; WFL strength ?LLE Deficits / Details: prior bil TMA noted; WFL strength ?  ? ?Cervical / Trunk Assessment ?Cervical / Trunk Assessment: Normal  ?Communication  ? Communication: No difficulties  ?Cognition Arousal/Alertness: Awake/alert ?Behavior During Therapy: [facility] for tasks assessed/performed ?Overall Cognitive Status: Within Functional Limits for tasks assessed ?  ?  ?  ?  ?  ?  ?  ?  ?  ?  ?  ?  ?  ?  ?  ?  ?  ?  ?  ? ?  ?General Comments General comments (skin integrity, edema, etc.): HR 110s at rest, pt unhooked lead to go to bathroom thus no measurement of HR with mobility this date; educated pt on energy conservation techniques and provided pt with handout; educated pt on reducing sodium intake, fluid restriction, increased frequency of short bouts of activity, and weighing self daily ? ?  ?Exercises    ? ?Assessment/Plan  ?  ?PT Assessment Patient does not need any further PT services  ?PT Problem List   ? ?   ?  ?PT Treatment Interventions     ? ?PT Goals (Current goals can be found in the Care Plan section)  ?Acute Rehab PT Goals ?Patient Stated Goal: to figure out housing ?PT Goal  Formulation: All assessment and education complete, DC therapy ?Time For Goal Achievement: 08/11/21 ?Potential to Achieve Goals: Good ? ?  ?Frequency   ?  ? ? ?Co-evaluation   ?  ?  ?  ?  ? ? ?  ?AM-PAC PT "6 Clicks" Mobility  ?Outcome Measure Help needed turning from your back to your side while in a flat bed without using bedrails?: None ?Help needed moving from lying on your back to sitting on the side of a flat bed without using bedrails?: None ?Help needed moving to and from a bed to a chair (including a wheelchair)?: None ?Help needed standing up from a chair using your arms (e.g., wheelchair or bedside chair)?: None ?Help needed to walk in hospital room?: A Little ?Help needed climbing 3-5 steps with a railing? : A Little ?6 Click Score: 22 ? ?  ?End of Session   ?Activity Tolerance: Patient tolerated treatment well ?Patient left: in bed;with call bell/phone within reach ?Nurse Communication: Mobility status ?PT Visit Diagnosis: Unsteadiness on feet (R26.81);Other abnormalities of gait and mobility (R26.89) ?  ? ?Time: [id number] ?PT Time Calculation (min) (ACUTE  ONLY): 22 min ? ? ?Charges:   PT Evaluation ?$PT Eval Moderate Complexity: 1 Mod ?  ?  ?   ? ? ?Moishe Spice, PT, DPT ?Acute Rehabilitation Services  ?Pager: [phone] ?Office: [phone] ? ? ?Maretta Bees Pettis ?08/10/2021, 5:41 PM ? ?

## 2021-08-10 NOTE — Plan of Care (Signed)

## 2021-08-10 NOTE — Discharge Summary (Addendum)
Advanced Heart Failure Team  Discharge Summary   Patient ID: Robert Booth MRN: [id number], DOB/AGE: [birth year] 51 y.o. 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    Over the last few years EF was down to 15% and had improved to 35-40% back in 2016. He stopped taking his medications and EF went back down to 15%.   Presented to clinic 4/27 for routine f/u, complaining of fatigue, dyspnea at rest and hemoptysis. Heart Logic Score 76. Admitted from HF clinic with cardiogenic shock w/ volume overload and AKI. K 6.1, lactic acid 2.9, creatinine 2.3, AST 93 ALT 205, and WBC 15. Chest CT w/ LLL PNA.   Echo w/ severe BiV failure LVEF 15% and large LV thrombus. Admitted to ICU. Started on inotropic support, IV lasix and heparin gtt. Diuresed w/ IV lasix. PNA treated w/ cefepime. Blood Cx remained negative.   R/LHC 5/1 showed normal coronary arteries. Low output (CI 1.4) and high filling pressures (PCWP 30). Moved back to ICU. Milrinone increased. IV Lasix restarted, diuresed further. Failed initial milrinone wean. Felt to be end-stage heart failure. Not candidate for heart transplant nor home inotrope's given social issues. VAD considered but felt to be poor candidate given severe RV dysfunction. He was weaned off milrinone again slowly. Failed wean again w/ redevelopment of recurrent cardiogenic shock and placed back on milrinone. Given end-stage HF and not candidate for advanced therapies,  decision was made to switch to comfort care. Milrinone discontinued and ICD deactivated. Palliative care team assisted w/ transition to home hospice. Services arranged. Discharged home on 08/16/21.     2D Echo 07/26/21 Left ventricular apical thrombus present, 3.5 x 1.5 cm, partially calcified, not seen on echo from 03/01/21 on side by side comparison. Critical results conveyed to Darrick Grinder, NP @ 3:40 pm and acknowledged. 1. Left ventricular ejection fraction, by estimation, is 15%. The left ventricle has severely decreased function. The left ventricle demonstrates global hypokinesis. The left ventricular internal cavity size was severely dilated. Left ventricular diastolic parameters are indeterminate. 2. Right ventricular systolic function is moderately reduced. The right ventricular size is normal. There is moderately elevated pulmonary artery systolic pressure. The estimated right ventricular systolic pressure is [id number] mmHg. 3. 4. Right atrial size was moderately dilated. The mitral valve is grossly normal. Mild mitral valve regurgitation. No evidence of mitral stenosis. 5. 6. Tricuspid valve regurgitation is severe. The aortic valve is grossly normal. Aortic valve regurgitation is trivial. No aortic stenosis is present. 7. The inferior vena cava is dilated in size with <50% respiratory variability, suggesting right atrial pressure of 15 mmHg.  R/LHC 07/30/21 On milrinone 0.125 mcg/kg/min   Ao = 118/97 (105) RA = 13  RV = 64/13 (19) PA = 55/47 (50) PCW = 30 Fick cardiac output/index = 3.16/1.51 Thermo CO/CI = 3.19/1.52 PVR = 6.1 WU SVR = 2300 PAPi = 0.6 Ao sat = 96% PA sat = 46%, 46%   Discharge Weight Range: 207 lb  Discharge Vitals:  Blood pressure 104/80, pulse (!) 104, temperature 97.8 F (36.6 C), temperature source Oral, resp. rate 14, height 5\' 9"  (1.753 m), weight 94.2 kg, SpO2 98 %.  Labs: Lab Results  Component Value Date   WBC 11.8 (H) 08/16/2021   HGB  11.9 (L) 08/16/2021   HCT 34.6 (L) 08/16/2021   MCV 89.2 08/16/2021   PLT 367 08/16/2021    Recent Labs  Lab 08/16/21 0512  NA 134*  K 4.3  CL 96*  CO2 26  BUN 50*  CREATININE 2.39*  CALCIUM 9.3  GLUCOSE 93   Lab Results  Component Value Date   CHOL 113 07/28/2021   HDL 31 (L) 07/28/2021   LDLCALC 70 07/28/2021   TRIG 61 07/28/2021   BNP (last 3 results) Recent Labs    04/06/21 1121 07/05/21 0947 07/26/21 1243  BNP 42.4 3,398.7* 1,528.0*    ProBNP (last 3 results) No results for input(s): PROBNP in the last 8760 hours.   Diagnostic Studies/Procedures   2D Echo 07/26/21 Left ventricular apical thrombus present, 3.5 x 1.5 cm, partially calcified, not seen on echo from 03/01/21 on side by side comparison. Critical results conveyed to Darrick Grinder, NP @ 3:40 pm and acknowledged. 1. Left ventricular ejection fraction, by estimation, is 15%. The left ventricle has severely decreased function. The left ventricle demonstrates global hypokinesis. The left ventricular internal cavity size was severely dilated. Left ventricular diastolic parameters are indeterminate. 2. Right ventricular systolic function is moderately reduced. The right ventricular size is normal. There is moderately elevated pulmonary artery systolic pressure. The estimated right ventricular systolic pressure is [id number] mmHg. 3. 4. Right atrial size was moderately dilated. The mitral valve is grossly normal. Mild mitral valve regurgitation. No evidence of mitral stenosis. 5. 6. Tricuspid valve regurgitation is severe. The aortic valve is grossly normal. Aortic valve regurgitation is trivial. No aortic stenosis is present. 7. The inferior vena cava is dilated in size with <50% respiratory variability, suggesting right atrial pressure of 15 mmHg.  R/LHC 07/30/21 On milrinone 0.125 mcg/kg/min   Ao = 118/97 (105) RA = 13  RV = 64/13 (19) PA = 55/47 (50) PCW = 30 Fick cardiac output/index =  3.16/1.51 Thermo CO/CI = 3.19/1.52 PVR = 6.1 WU SVR = 2300 PAPi = 0.6 Ao sat = 96% PA sat = 46%, 46%   Discharge Medications   Allergies as of 08/16/2021       Reactions   Celexa [citalopram Hydrobromide] Other (See Comments)   Makes the patient disoriented   Effexor [venlafaxine] Other (See Comments)   Altered patient's thinking process(confused)   Lisinopril Cough        Medication List     STOP taking these medications    allopurinol 300 MG tablet Commonly known as: ZYLOPRIM   atorvastatin 20 MG tablet Commonly known as: LIPITOR   Blood Pressure Cuff Misc   carvedilol 12.5 MG tablet Commonly known as: COREG   digoxin 0.125 MG tablet Commonly known as: LANOXIN   Entresto 49-51 MG Generic drug: sacubitril-valsartan   furosemide 20 MG tablet Commonly known as: LASIX Replaced by: furosemide 10 MG/ML injection   hydrALAZINE 25 MG tablet Commonly known as: APRESOLINE   hydrOXYzine 25 MG capsule Commonly known as: VISTARIL   potassium chloride SA 20 MEQ tablet Commonly known as: KLOR-CON M   spironolactone 25 MG tablet Commonly known as: ALDACTONE       TAKE these medications    apixaban 5 MG Tabs tablet Commonly known as: ELIQUIS  Take 1 tablet (5 mg total) by mouth 2 (two) times daily.   dapagliflozin propanediol 10 MG Tabs tablet Commonly known as: FARXIGA Take 1 tablet (10 mg total) by mouth daily before breakfast.   furosemide 10 MG/ML injection Commonly known as: LASIX Inject 8 mLs (80 mg total) into the vein daily. Replaces: furosemide 20 MG tablet   gabapentin 300 MG capsule Commonly known as: NEURONTIN Take 2 capsules (600 mg total) by mouth 2 (two) times daily.   mexiletine 150 MG capsule Commonly known as: MEXITIL Take 2 capsules (300 mg total) by mouth 2 (two) times daily.   pantoprazole 40 MG tablet Commonly known as: PROTONIX Take 1 tablet (40 mg total) by mouth daily. What changed: additional instructions    QUEtiapine 100 MG tablet Commonly known as: SEROQUEL Take 100 mg by mouth at bedtime.   sertraline 100 MG tablet Commonly known as: ZOLOFT Take 200 mg by mouth daily.        Disposition   The patient will be discharged to home hospice care.      Duration of Discharge Encounter: Greater than 35 minutes   Signed, Lyda Jester, PA-C  08/16/2021, 12:35 PM   He has end-stage biventricular HF. Very tenuous. He is requesting d/c home with Hospice. We will arrange.   Glori Bickers, MD  12:42 PM

## 2021-08-11 DIAGNOSIS — I5023 Acute on chronic systolic (congestive) heart failure: Secondary | ICD-10-CM | POA: Diagnosis not present

## 2021-08-11 DIAGNOSIS — Z515 Encounter for palliative care: Secondary | ICD-10-CM | POA: Diagnosis not present

## 2021-08-11 LAB — CBC WITH DIFFERENTIAL/PLATELET
Abs Immature Granulocytes: 0.07 10*3/uL (ref 0.00–0.07)
Basophils Absolute: 0.1 10*3/uL (ref 0.0–0.1)
Basophils Relative: 1 %
Eosinophils Absolute: 0.3 10*3/uL (ref 0.0–0.5)
Eosinophils Relative: 3 %
HCT: 34.2 % — ABNORMAL LOW (ref 39.0–52.0)
Hemoglobin: 11.6 g/dL — ABNORMAL LOW (ref 13.0–17.0)
Immature Granulocytes: 1 %
Lymphocytes Relative: 17 %
Lymphs Abs: 1.8 10*3/uL (ref 0.7–4.0)
MCH: 30.8 pg (ref 26.0–34.0)
MCHC: 33.9 g/dL (ref 30.0–36.0)
MCV: 90.7 fL (ref 80.0–100.0)
Monocytes Absolute: 1 10*3/uL (ref 0.1–1.0)
Monocytes Relative: 10 %
Neutro Abs: 7.4 10*3/uL (ref 1.7–7.7)
Neutrophils Relative %: 68 %
Platelets: 333 10*3/uL (ref 150–400)
RBC: 3.77 MIL/uL — ABNORMAL LOW (ref 4.22–5.81)
RDW: 15.2 % (ref 11.5–15.5)
WBC: 10.7 10*3/uL — ABNORMAL HIGH (ref 4.0–10.5)
nRBC: 0 % (ref 0.0–0.2)

## 2021-08-11 LAB — BASIC METABOLIC PANEL
Anion gap: 12 (ref 5–15)
BUN: 61 mg/dL — ABNORMAL HIGH (ref 6–20)
CO2: 25 mmol/L (ref 22–32)
Calcium: 9.4 mg/dL (ref 8.9–10.3)
Chloride: 97 mmol/L — ABNORMAL LOW (ref 98–111)
Creatinine, Ser: 2.12 mg/dL — ABNORMAL HIGH (ref 0.61–1.24)
GFR, Estimated: 37 mL/min — ABNORMAL LOW (ref >60)
Glucose, Bld: 84 mg/dL (ref 70–99)
Potassium: 3.8 mmol/L (ref 3.5–5.1)
Sodium: 134 mmol/L — ABNORMAL LOW (ref 135–145)

## 2021-08-11 LAB — MAGNESIUM: Magnesium: 2.3 mg/dL (ref 1.7–2.4)

## 2021-08-11 LAB — COOXEMETRY PANEL
Carboxyhemoglobin: <0.3 % — ABNORMAL LOW (ref 0.5–1.5)
Methemoglobin: <0.7 % (ref 0.0–1.5)
O2 Saturation: 37 %
Total hemoglobin: 11.1 g/dL — ABNORMAL LOW (ref 12.0–16.0)

## 2021-08-11 MED ORDER — MILRINONE LACTATE IN DEXTROSE 20-5 MG/100ML-% IV SOLN
0.2500 ug/kg/min | INTRAVENOUS | Status: DC
  Administered 2021-08-11 – 2021-08-14 (×6): 0.25 ug/kg/min via INTRAVENOUS
  Filled 2021-08-11 (×6): qty 100

## 2021-08-11 MED ORDER — FUROSEMIDE 10 MG/ML IJ SOLN
80.0000 mg | Freq: Two times a day (BID) | INTRAMUSCULAR | Status: DC
  Administered 2021-08-11 – 2021-08-16 (×11): 80 mg via INTRAVENOUS
  Filled 2021-08-11 (×11): qty 8

## 2021-08-11 MED ORDER — POTASSIUM CHLORIDE CRYS ER 20 MEQ PO TBCR
20.0000 meq | EXTENDED_RELEASE_TABLET | Freq: Once | ORAL | Status: AC
  Administered 2021-08-11: 20 meq via ORAL
  Filled 2021-08-11: qty 1

## 2021-08-11 NOTE — Plan of Care (Signed)
Discussed with patient plan of care for the evening, pain management and fluid intake with some teach back displayed.  Patient is going to try small amounts of ice chips instead of straight water throughout the shift. ? ? ?Problem: Education: ?Goal: Knowledge of General Education information will improve ?Description: Including pain rating scale, medication(s)/side effects and non-pharmacologic comfort measures ?Outcome: Progressing ?  ?

## 2021-08-11 NOTE — Plan of Care (Signed)
Discussed with patient plan of care for the evening, pain management and importance of measuring urine with some teach back displayed.  Encouraged and showed patient how to use their incentive spirometry. ? ? ?Problem: Education: ?Goal: Knowledge of General Education information will improve ?Description: Including pain rating scale, medication(s)/side effects and non-pharmacologic comfort measures ?Outcome: Progressing ?  ?Problem: Health Behavior/Discharge Planning: ?Goal: Ability to manage health-related needs will improve ?Outcome: Progressing ?  ?

## 2021-08-11 NOTE — Progress Notes (Signed)
? ?  Palliative Medicine Inpatient Follow Up Note ? ? ?HPI: ?51 y.o. male   admitted on 07/26/2021 with  ?HFrEF, NICM, [employer] ICD, ETOH, NSVT, gout, LV mural thrombus, and CKD Stage IIIa. 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 Over the last few years EF was down to 15% and had improved to 35-40% back in 2016. He stopped taking his medications and EF went back down to 15%. He was evaluated for an LVAD though not a candidate. ? ?Palliative care helping with ongoing goals of care conversations.  ? ?Today's Discussion 08/11/2021 ? ?*Please note that this is a verbal dictation therefore any spelling or grammatical errors are due to the "[place] One" system interpretation. ? ?Chart reviewed inclusive of vital signs, progress notes, laboratory results, and diagnostic images.  ? ?I met with Treyon this morning. He was resting in the recliner chair. He denies pain, shortness of breath, or nausea this morning.  ? ?Reviewed that the cardiology team is trying to stabilize [facility] on oral medications. He is aware of this.  ? ?Discussed that Lister is not certain of where he will transition from here and he shares that he does not know presently, he has no current housing and has been living with his sister. I will request the case worker follow up. ? ?Created space and opportunity for patient to explore thoughts feelings and fears regarding current medical situation. ? ?Questions and concerns addressed  ? ?Palliative Support Provided ? ?Objective Assessment: ?Vital Signs ?Vitals:  ? 08/11/21 0500 08/11/21 0712  ?BP:  108/87  ?Pulse: (!) 108   ?Resp: 19 18  ?Temp:  97.8 ?F (36.6 ?C)  ?SpO2: 93% 97%  ? ? ?Intake/Output Summary (Last 24 hours) at 08/11/2021 1031 ?Last data filed at 08/11/2021 0800 ?Gross per 24 hour  ?Intake 1190 ml  ?Output 2250 ml  ?Net -1060 ml  ? ?Last Weight  Most recent update: 08/11/2021  5:05 AM  ? ? Weight  ?93.7 kg (206 lb 9.1 oz)  ?      ? ?  ? ?Gen:  Middle aged [place] M in  NAD ?HEENT: moist mucous membranes ?CV: Irregular rate and rhythm ?PULM: On RA, breathing is even and nonlabored ?ABD: soft/nontender ?EXT: No edema ?Neuro: Alert and oriented x3 ? ?SUMMARY OF RECOMMENDATIONS   ?- DNR/DNI ?- Continue with medical management, patient hopes for ongoing quality of life ?- Patient wishes to secure HCPOA, he wishes for his Sister [name] to be that person --> Spiritual order placed ?- TOC - OP Palliative care on discharge --> Patient is open to hospice services when eligible  ?- CM --> Patient has no housing ?- Ongoing Palliative support ? ?Billing based on MDM: High  ?______________________________________________________________________________________ ?Tacey Ruiz ?[place] Team ?Team Cell Phone: [phone] ?Please utilize secure chat with additional questions, if there is no response within 30 minutes please call the above phone number ? ?Palliative Medicine Team providers are available by phone from 7am to 7pm daily and can be reached through the team cell phone.  ?Should this patient require assistance outside of these hours, please call the patient's attending physician. ? ? ? ? ?

## 2021-08-11 NOTE — TOC Progression Note (Signed)
Transition of Care (TOC) - Progression Note  ? ? ?Patient Details  ?Name: Tannar Watkin ?MRN: [id number] ?Date of Birth: 1971-01-23 ? ?Transition of Care (TOC) CM/SW Contact  ?Altamese Deguire G., RN ?Phone Number: ?08/11/2021, 11:24 AM ? ?Clinical Narrative:    ? ?Mr Chatham is a 51 year old with a history of HFrEF, NICM, [employer] ICD, ETOH, NSVT, gout, and CKD Stage IIIa. 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 ?  ?Admitted with A/C HFrEF ? ?RNCM received message from Tacey Ruiz, NP, regarding patient's discharge disposition.  RNCM spoke with patient at the [facility].  Patient states he is not sure of discharge plan at this time.  Stated he may be able to discharge to his sister Lisa's house, but not sure.  Patient staetd it was okay for me to call Lattie Haw.  RNCM called Lattie Haw at 1110 and left message for return call.  TOC will continue to follow. ? ?  ?  ? ?Expected Discharge Plan and Services ?  ?  ?  ?  ?  ?                ?  ?  ?  ?  ?  ?  ?  ?  ?  ?  ? ?Social Determinants of Health (SDOH) Interventions ?  ? ?Readmission Risk Interventions ?   ? View : No data to display.  ?  ?  ?  ? ? ?

## 2021-08-11 NOTE — Progress Notes (Signed)
Patient ID: Robert Booth, male   DOB: 03-Oct-1970, 51 y.o.   MRN: [id number] ?  ? ? Advanced Heart Failure Rounding Note ? ?PCP-Cardiologist: Dr. Haroldine Laws  ? ?Subjective:   ? ?4/26: Admitted w/ CS + marked volume overload. Echo severe BiV failure EF 15% + large LV thrombus. Placed on milrinone and diuresed w/ IV Lasix.  ?5/1: Cath with normal coronary arteries. Low output (CI 1.4) and high filling pressures (PCWP 30). Moved back to ICU. Milrinone increased. IV Lasix restarted. VAD w/u initiated.  ?05/08: Milrinone decreased to 0.25 ?09-06-2022: Milrinone deceased to 0.125  ?Sep 07, 2022: milrinone stopped . Robert Booth and farxiga added.  ? ?Co-ox 37%.  He is off milrinone.  CVP up to 20 and creatinine up to 2.12.  ? ?He has spoken with palliative care service, not sure what he wants to do in terms of hospice care yet.  Sister hopefully coming this afternoon.  ? ?Objective:   ?Weight Range: ?93.7 kg ?Body mass index is 30.51 kg/m?.  ? ?Vital Signs:   ?Temp:  [97.5 ?F (36.4 ?C)-98 ?F (36.7 ?C)] 97.8 ?F (36.6 ?C) (05/13 [id number]) ?Pulse Rate:  [108-113] 108 (05/13 0500) ?Resp:  [14-20] 18 (05/13 [id number]) ?BP: (99-111)/(75-91) 108/87 (05/13 [id number]) ?SpO2:  [93 %-100 %] 97 % (05/13 0712) ?Weight:  [93.7 kg] 93.7 kg (05/13 0500) ?Last BM Date : 08/11/21 ? ?Weight change: ?Filed Weights  ? 09-06-2021 0600 08/10/21 0500 08/11/21 0500  ?Weight: 93.3 kg 93.3 kg 93.7 kg  ? ? ?Intake/Output:  ? ?Intake/Output Summary (Last 24 hours) at 08/11/2021 1058 ?Last data filed at 08/11/2021 0800 ?Gross per 24 hour  ?Intake 1190 ml  ?Output 2250 ml  ?Net -1060 ml  ? ? ?Physical Exam  ?CVP 20  ?General: NAD ?Neck: JVP 16+, no thyromegaly or thyroid nodule.  ?Lungs: Clear to auscultation bilaterally with normal respiratory effort. ?CV: Lateral PMI.  Heart regular S1/S2, no S3/S4, 2/6 HSM apex.  1+ edema to knees.  ?Abdomen: Soft, nontender, no hepatosplenomegaly, no distention.  ?Skin: Intact without lesions or rashes.  ?Neurologic: Alert and oriented x 3.  ?Psych:  Normal affect. ?Extremities: No clubbing or cyanosis.  ?HEENT: Normal.  ? ? ?Telemetry  ? ?Sinus Tach 110s (personally reviewed) ? ?Labs  ?  ?CBC ?Recent Labs  ?  08/10/21 ?[id number] 08/11/21 ?[id number]  ?WBC 11.5* 10.7*  ?NEUTROABS 7.7 7.4  ?HGB 12.7* 11.6*  ?HCT 36.1* 34.2*  ?MCV 90.5 90.7  ?PLT 329 333  ? ?Basic Metabolic Panel ?Recent Labs  ?  08/10/21 ?[id number] 08/11/21 ?[id number]  ?NA 133* 134*  ?K 4.7 3.8  ?CL 100 97*  ?CO2 23 25  ?GLUCOSE 99 84  ?BUN 58* 61*  ?CREATININE 1.90* 2.12*  ?CALCIUM 9.6 9.4  ?MG 2.3 2.3  ? ?Liver Function Tests ?No results for input(s): AST, ALT, ALKPHOS, BILITOT, PROT, ALBUMIN in the last 72 hours. ? ?No results for input(s): LIPASE, AMYLASE in the last 72 hours. ?Cardiac Enzymes ?No results for input(s): CKTOTAL, CKMB, CKMBINDEX, TROPONINI in the last 72 hours. ? ?BNP: ?BNP (last 3 results) ?Recent Labs  ?  04/06/21 ?1121 07/05/21 ?[id number] 07/26/21 ?1243  ?BNP 42.4 3,398.7* 1,528.0*  ? ? ?ProBNP (last 3 results) ?No results for input(s): PROBNP in the last 8760 hours. ? ? ?D-Dimer ?No results for input(s): DDIMER in the last 72 hours. ?Hemoglobin A1C ?No results for input(s): HGBA1C in the last 72 hours. ? ?Fasting Lipid Panel ?No results for input(s): CHOL, HDL, LDLCALC, TRIG, CHOLHDL, LDLDIRECT in the last 72 hours. ? ?  Thyroid Function Tests ?No results for input(s): TSH, T4TOTAL, T3FREE, THYROIDAB in the last 72 hours. ? ?Invalid input(s): FREET3 ? ?Other results: ? ? ?Imaging  ? ? ?No results found. ? ? ?Medications:   ? ? ?Scheduled Medications: ? apixaban  5 mg Oral BID  ? atorvastatin  20 mg Oral Daily  ? Chlorhexidine Gluconate Cloth  6 each Topical Daily  ? dapagliflozin propanediol  10 mg Oral Daily  ? digoxin  0.0625 mg Oral Daily  ? feeding supplement  237 mL Oral BID BM  ? furosemide  80 mg Intravenous BID  ? gabapentin  600 mg Oral BID  ? ivabradine  5 mg Oral BID WC  ? mexiletine  300 mg Oral BID  ? multivitamin with minerals  1 tablet Oral Daily  ? pantoprazole  40 mg Oral Daily  ?  potassium chloride  20 mEq Oral Once  ? QUEtiapine  100 mg Oral QHS  ? sertraline  200 mg Oral Daily  ? sodium chloride flush  10-40 mL Intracatheter Q12H  ? spironolactone  25 mg Oral Daily  ? ? ?Infusions: ? sodium chloride 10 mL/hr at 08/07/21 2246  ? sodium chloride Stopped (08/10/21 1408)  ? sodium chloride Stopped (08/04/21 1814)  ? milrinone    ? ? ?PRN Medications: ?sodium chloride, acetaminophen, guaiFENesin-dextromethorphan, menthol-cetylpyridinium, ondansetron (ZOFRAN) IV, sodium chloride flush ? ? ? ?Patient Profile  ? ?51 y/o male with severe systolic HF due to NICM. EF 15-20%. Presented to clinic with low output symptoms and marked fluid overload. Not responding to oral diuretics.  ?  ?ICD interrogated. No VT but HL score 77.  ?  ?Admitted to ICU. Lactate 2.9. Echo w/ large LV thrombus, EF 15%, RV mod reduced, severe TR   ? ?Assessment/Plan  ? ?1. Acute on chronic systolic HF >>Cardiogenic Shock  ?- NICM. [employer] ICD ?- Cath 2016 - no CAD. 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  ?- Echo 10/2016 EF 10-15%. LV dysfunction after stopping HF meds. ?- Echo 03/01/21 EF 20-25% RV moderately reduced ?- cMRI 2/19 EF 19%. RV mildly decreased .  ?- CPX 11/20 with mild functional limitation; no clear HF limitation despite EF<20% ?- admitted with cardiogenic shock and marked fluid overload. Initial lactate 2.9. Started on Milrinone ?- Echo 4/23 EF 15%, + LV thrombus, RV mod reduced, severe TR  ?- R/LHC on 0.125 of milrinone w/ low output (CI 1.4) and high filling pressers, normal cors. Milrinone increased back to 0.25 ?- Not candidate for advanced therapies or home inotropes. He is end-stage. Not transplant candidate with social issues. Likely would not tolerate VAD due to severe RV dysfunction. Not a candidate for home inotropes.  ?- Now off milrinone. CO-OX 37%, CVP up to 20. Patient is still thinking about hospice/comfort care, has spoken with palliative care and wants to talk with his sister who is hopefully coming  today.  In the mean time, with markedly high CVP and rising creatinine, I will restart milrinone 0.25 + Lasix 80 mg IV bid.  ?- Continue digoxin 0.0625 ?- Continue spiro to 25 daily ?- Continue corlanor 5 mg BID ?- Will need ongoing discussions with palliative care.  When he comes to decision, will aim to come off milrinone again but would need to be with plan for comfort care/hospice.  ?  ?2. LV mural thrombus  ?- He has been off warfarin ?- No thrombus on echo 12/22.  ?- Echo this admit w/ LV thrombus.  ?- Continue Eliquis   ?-  No obvious bleeding ?  ?3. H/o ETOH abuse ?- previously heavy drinker . No ETOH since 2021.  ?  ?4. VT 07/08/17  ?- He is s/p [employer] ICD 07/03/17. ?- Multiple episodes sustained VT on device check 06/621. Mexiletine increased to 300 mg BID. ?-No Vt  ?- Continue mexiletine at current dose. ?- Keep K > 4.0 Mg > 2.0  ?  ?5. 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 ?- Continues to struggle with balance/mobility.  ? ?6. Persistent cough with LLL PNA on CT ?- PCT 0.3 ?- cefepime started 5/6, now off.  ?  ?7. Anxiety/Depression ?- Follows with [facility] [place]. ?  ?8. OSA ?- Has not tolerated CPAP ? ?9. Hemoptysis ?- Hgb stable at 11.6 ?- likely 2/2 pulmonary edema +/- LL PNA ?  ?10. AKI on CKD:  ?-Creatinine baseline previously 1.2-1.4  ?-Creatinine 2.34>>> trending up to 2.1 today.   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  ?11. Hyperkalemia  ?- K 6.1 on admit resolved w/ Lokelma ?- Stable today.  ? ?12. Tachycardia ?- sinus tach  ?- poor prognostic factor ? ?  ?Length of Stay: 16 ? ?Loralie Champagne, MD  ?08/11/2021, 10:58 AM ? ?Advanced Heart Failure Team ?Pager [phone] (M-F; 7a - 5p)  ?Please contact [facility] for night-coverage after hours (5p -7a ) and weekends on [email or web address] ? ?

## 2021-08-12 DIAGNOSIS — I5023 Acute on chronic systolic (congestive) heart failure: Secondary | ICD-10-CM | POA: Diagnosis not present

## 2021-08-12 DIAGNOSIS — Z515 Encounter for palliative care: Secondary | ICD-10-CM | POA: Diagnosis not present

## 2021-08-12 DIAGNOSIS — Z7189 Other specified counseling: Secondary | ICD-10-CM | POA: Diagnosis not present

## 2021-08-12 LAB — CBC WITH DIFFERENTIAL/PLATELET
Abs Immature Granulocytes: 0.04 10*3/uL (ref 0.00–0.07)
Basophils Absolute: 0.1 10*3/uL (ref 0.0–0.1)
Basophils Relative: 1 %
Eosinophils Absolute: 0.3 10*3/uL (ref 0.0–0.5)
Eosinophils Relative: 3 %
HCT: 32.8 % — ABNORMAL LOW (ref 39.0–52.0)
Hemoglobin: 11.7 g/dL — ABNORMAL LOW (ref 13.0–17.0)
Immature Granulocytes: 0 %
Lymphocytes Relative: 19 %
Lymphs Abs: 2 10*3/uL (ref 0.7–4.0)
MCH: 31.7 pg (ref 26.0–34.0)
MCHC: 35.7 g/dL (ref 30.0–36.0)
MCV: 88.9 fL (ref 80.0–100.0)
Monocytes Absolute: 0.7 10*3/uL (ref 0.1–1.0)
Monocytes Relative: 7 %
Neutro Abs: 7.6 10*3/uL (ref 1.7–7.7)
Neutrophils Relative %: 70 %
Platelets: 332 10*3/uL (ref 150–400)
RBC: 3.69 MIL/uL — ABNORMAL LOW (ref 4.22–5.81)
RDW: 15 % (ref 11.5–15.5)
WBC: 10.7 10*3/uL — ABNORMAL HIGH (ref 4.0–10.5)
nRBC: 0 % (ref 0.0–0.2)

## 2021-08-12 LAB — MAGNESIUM: Magnesium: 1.9 mg/dL (ref 1.7–2.4)

## 2021-08-12 LAB — BASIC METABOLIC PANEL
Anion gap: 11 (ref 5–15)
BUN: 57 mg/dL — ABNORMAL HIGH (ref 6–20)
CO2: 28 mmol/L (ref 22–32)
Calcium: 9.2 mg/dL (ref 8.9–10.3)
Chloride: 92 mmol/L — ABNORMAL LOW (ref 98–111)
Creatinine, Ser: 1.99 mg/dL — ABNORMAL HIGH (ref 0.61–1.24)
GFR, Estimated: 40 mL/min — ABNORMAL LOW (ref >60)
Glucose, Bld: 160 mg/dL — ABNORMAL HIGH (ref 70–99)
Potassium: 3.3 mmol/L — ABNORMAL LOW (ref 3.5–5.1)
Sodium: 131 mmol/L — ABNORMAL LOW (ref 135–145)

## 2021-08-12 LAB — COOXEMETRY PANEL
Carboxyhemoglobin: 1.5 % (ref 0.5–1.5)
Methemoglobin: 1.1 % (ref 0.0–1.5)
O2 Saturation: 57.9 %
Total hemoglobin: 12.1 g/dL (ref 12.0–16.0)

## 2021-08-12 MED ORDER — MAGNESIUM SULFATE 2 GM/50ML IV SOLN
2.0000 g | Freq: Once | INTRAVENOUS | Status: AC
  Administered 2021-08-12: 2 g via INTRAVENOUS
  Filled 2021-08-12: qty 50

## 2021-08-12 MED ORDER — POTASSIUM CHLORIDE CRYS ER 20 MEQ PO TBCR
40.0000 meq | EXTENDED_RELEASE_TABLET | Freq: Two times a day (BID) | ORAL | Status: DC
  Administered 2021-08-12 – 2021-08-15 (×7): 40 meq via ORAL
  Filled 2021-08-12 (×7): qty 2

## 2021-08-12 NOTE — Plan of Care (Signed)

## 2021-08-12 NOTE — Progress Notes (Signed)
? ?Palliative Medicine Inpatient Follow Up Note ? ?HPI: ?51 y.o. male   admitted on 07/26/2021 with  ?HFrEF, NICM, [employer] ICD, ETOH, NSVT, gout, LV mural thrombus, and CKD Stage IIIa. 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 Over the last few years EF was down to 15% and had improved to 35-40% back in 2016. He stopped taking his medications and EF went back down to 15%. He was evaluated for an LVAD though not a candidate. ? ?Palliative care helping with ongoing goals of care conversations.  ? ?Today's Discussion 08/12/2021 ? ?*Please note that this is a verbal dictation therefore any spelling or grammatical errors are due to the "[facility]" system interpretation. ? ?Chart reviewed inclusive of vital signs, progress notes, laboratory results, and diagnostic images.  ? ?I met with Alioune this morning.  We reviewed that he has now had to go back on the milrinone drip which is worrisome in terms of the long-term prognosis of his disease.  We discussed that having a milrinone drip long-term is not something which is sustainable and it does appear that his body is telling him what it is and is not capable of.  Alamin shares that he realizes he is at end-stage of his disease though he does not like constantly being reminded of it.  I shared that my goal is not to remind him of that but rather to determine a plan moving forward aligned with his goals. ? ?Ameir is hopeful that he can go after hospitalization to live with his Laureen Ochs for a few weeks.  His mother would be able to stay with him during the day.  We discussed that the best option for him unfortunately in the setting of his terminal prognosis is having hospice at least his home.  We reviewed that hospice enables patients to preserve dignity and quality at the end of their lives.  At this time Neville would be amenable to that. ? ?A voicemail was left for patient's Sister Lattie Haw to identify which she can and cannot  accommodate. ? ?Ongoing palliative support. ?__________________________________________ ?Addendum: ? ?I called patients mother, Stanton Kidney this afternoon as well as sister, Charisse March. We reviewed that Avelino's cardiac function is quite poor and I'm afraid the medications he is on presently he would be unlikely to sustain off of. We reviewed that I had discussed with Malikah it may be prudent to now consider hospice care. I described hospice as a service for patients who have a life expectancy of 6 months or less. The goal of hospice is the preservation of dignity and quality at the end phases of life. Under hospice care, the focus changes from curative to symptom relief.  ? ?Patients mother and sister were sad to hear the above news. ? ?Charisse March shares that Lattie Haw works 12 hour shifts. We reviewed that even under hospice care, a hospice RN cannot be there with Elberta Fortis 12 hrs a day. I would worry as he progresses to end of life that there may not be sufficient care for him. Reviewed the differences between home hospice and inpatient hospice.  ? ?Discussed the importance of setting up a family meeting so that everyone is on the same page. Patients family does not think they can come in physically though they do anticipate they can speak over the phone. I shared my colleague, Stanton Kidney would reach out in the morning to nail a time down.  ? ?Questions/Concerns answered.  ? ?Objective Assessment: ?Vital Signs ?Vitals:  ? 08/12/21  0710 08/12/21 0753  ?BP: (!) 109/96   ?Pulse:    ?Resp: (!) 26 13  ?Temp: 98.6 ?F (37 ?C)   ?SpO2: 97%   ? ? ?Intake/Output Summary (Last 24 hours) at 08/12/2021 0756 ?Last data filed at 08/12/2021 0421 ?Gross per 24 hour  ?Intake 1260 ml  ?Output 2600 ml  ?Net -1340 ml  ? ? ?Last Weight  Most recent update: 08/12/2021  4:24 AM  ? ? Weight  ?92.4 kg (203 lb 11.3 oz)  ?      ? ?  ? ?Gen:  Middle aged Delevan M in NAD ?HEENT: moist mucous membranes ?CV: Irregular rate and rhythm ?PULM: On RA, breathing is even and  nonlabored ?ABD: soft/nontender ?EXT: No edema ?Neuro: Alert and oriented x3 ? ?SUMMARY OF RECOMMENDATIONS   ?- DNR/DNI ?- Set up a family meeting (will need to be by phone) PMT will call in the morning to confirm a time ?- Continue with medical management, patient hopes for ongoing quality of life ?- Patient wishes to secure HCPOA, he wishes for his Sister [name] to be that person --> Spiritual order placed ?- TOC - -> patient will need hospice upon discharge though presently is unclear what setting he will go to when he leaves.  He would like to live with his Laureen Ochs with his mother coming in during the day to provide support though I worry as he progresses to active phases of dying that will not be enough support ?- Ongoing Palliative support, Suzan Nailer will take over patients care tomorrow ? ?Billing based on MDM: High  ?______________________________________________________________________________________ ?Tacey Ruiz ?[facility] Team ?Team Cell Phone: [phone] ?Please utilize secure chat with additional questions, if there is no response within 30 minutes please call the above phone number ? ?Palliative Medicine Team providers are available by phone from 7am to 7pm daily and can be reached through the team cell phone.  ?Should this patient require assistance outside of these hours, please call the patient's attending physician. ? ? ? ? ?

## 2021-08-12 NOTE — Progress Notes (Signed)
Patient ID: Robert Booth, male   DOB: 09-25-1970, 51 y.o.   MRN: [id number] ?  ? ? Advanced Heart Failure Rounding Note ? ?PCP-Cardiologist: Dr. Gala Romney  ? ?Subjective:   ? ?4/26: Admitted w/ CS + marked volume overload. Echo severe BiV failure EF 15% + large LV thrombus. Placed on milrinone and diuresed w/ IV Lasix.  ?5/1: Cath with normal coronary arteries. Low output (CI 1.4) and high filling pressures (PCWP 30). Moved back to ICU. Milrinone increased. IV Lasix restarted. VAD w/u initiated.  ?05/08: Milrinone decreased to 0.25 ?2022/08/13: Milrinone deceased to 0.125  ?2022-08-14: milrinone stopped . Cleda Daub and farxiga added.  ?05/13: Milrinone restarted ? ?Co-ox 58% back on milrinone 0.25.  Weight down 3 lbs with CVP still 18 on Lasix 80 mg IV bid.   ? ?Feels better back on milrinone.  ? ?Objective:   ?Weight Range: ?92.4 kg ?Body mass index is 30.08 kg/m?.  ? ?Vital Signs:   ?Temp:  [97.6 ?F (36.4 ?C)-98.6 ?F (37 ?C)] 98.6 ?F (37 ?C) (05/14 0710) ?Pulse Rate:  [93-109] 94 (05/14 0421) ?Resp:  [12-26] 13 (05/14 0753) ?BP: (108-114)/(78-96) 109/96 (05/14 0710) ?SpO2:  [85 %-100 %] 97 % (05/14 0710) ?Weight:  [92.4 kg] 92.4 kg (05/14 0421) ?Last BM Date : 08/10/21 ? ?Weight change: ?Filed Weights  ? 08/10/21 0500 08/11/21 0500 08/12/21 0421  ?Weight: 93.3 kg 93.7 kg 92.4 kg  ? ? ?Intake/Output:  ? ?Intake/Output Summary (Last 24 hours) at 08/12/2021 1134 ?Last data filed at 08/12/2021 0421 ?Gross per 24 hour  ?Intake 670 ml  ?Output 2000 ml  ?Net -1330 ml  ? ? ?Physical Exam  ?CVP 18  ?General: NAD ?Neck: JVP 16+, no thyromegaly or thyroid nodule.  ?Lungs: Clear to auscultation bilaterally with normal respiratory effort. ?CV: Lateral PMI.  Heart regular S1/S2, no S3/S4, no murmur.  1+ ankle edema.    ?Abdomen: Soft, nontender, no hepatosplenomegaly, no distention.  ?Skin: Intact without lesions or rashes.  ?Neurologic: Alert and oriented x 3.  ?Psych: Normal affect. ?Extremities: No clubbing or cyanosis.  ?HEENT: Normal.   ? ?Telemetry  ? ?Sinus Tach 100s (personally reviewed) ? ?Labs  ?  ?CBC ?Recent Labs  ?  08/11/21 ?0600 08/12/21 ?0245  ?WBC 10.7* 10.7*  ?NEUTROABS 7.4 7.6  ?HGB 11.6* 11.7*  ?HCT 34.2* 32.8*  ?MCV 90.7 88.9  ?PLT 333 332  ? ?Basic Metabolic Panel ?Recent Labs  ?  08/11/21 ?4599 08/12/21 ?0245  ?NA 134* 131*  ?K 3.8 3.3*  ?CL 97* 92*  ?CO2 25 28  ?GLUCOSE 84 160*  ?BUN 61* 57*  ?CREATININE 2.12* 1.99*  ?CALCIUM 9.4 9.2  ?MG 2.3 1.9  ? ?Liver Function Tests ?No results for input(s): AST, ALT, ALKPHOS, BILITOT, PROT, ALBUMIN in the last 72 hours. ? ?No results for input(s): LIPASE, AMYLASE in the last 72 hours. ?Cardiac Enzymes ?No results for input(s): CKTOTAL, CKMB, CKMBINDEX, TROPONINI in the last 72 hours. ? ?BNP: ?BNP (last 3 results) ?Recent Labs  ?  04/06/21 ?1121 07/05/21 ?7741 07/26/21 ?1243  ?BNP 42.4 3,398.7* 1,528.0*  ? ? ?ProBNP (last 3 results) ?No results for input(s): PROBNP in the last 8760 hours. ? ? ?D-Dimer ?No results for input(s): DDIMER in the last 72 hours. ?Hemoglobin A1C ?No results for input(s): HGBA1C in the last 72 hours. ? ?Fasting Lipid Panel ?No results for input(s): CHOL, HDL, LDLCALC, TRIG, CHOLHDL, LDLDIRECT in the last 72 hours. ? ?Thyroid Function Tests ?No results for input(s): TSH, T4TOTAL, T3FREE, THYROIDAB in the  last 72 hours. ? ?Invalid input(s): FREET3 ? ?Other results: ? ? ?Imaging  ? ? ?No results found. ? ? ?Medications:   ? ? ?Scheduled Medications: ? apixaban  5 mg Oral BID  ? atorvastatin  20 mg Oral Daily  ? Chlorhexidine Gluconate Cloth  6 each Topical Daily  ? dapagliflozin propanediol  10 mg Oral Daily  ? feeding supplement  237 mL Oral BID BM  ? furosemide  80 mg Intravenous BID  ? gabapentin  600 mg Oral BID  ? ivabradine  5 mg Oral BID WC  ? mexiletine  300 mg Oral BID  ? multivitamin with minerals  1 tablet Oral Daily  ? pantoprazole  40 mg Oral Daily  ? potassium chloride  40 mEq Oral BID  ? QUEtiapine  100 mg Oral QHS  ? sertraline  200 mg Oral Daily  ?  sodium chloride flush  10-40 mL Intracatheter Q12H  ? ? ?Infusions: ? sodium chloride 10 mL/hr at 08/07/21 2246  ? sodium chloride Stopped (08/10/21 1408)  ? sodium chloride Stopped (08/04/21 1814)  ? milrinone 0.25 mcg/kg/min (08/12/21 0758)  ? ? ?PRN Medications: ?sodium chloride, acetaminophen, guaiFENesin-dextromethorphan, menthol-cetylpyridinium, ondansetron (ZOFRAN) IV, sodium chloride flush ? ? ? ?Patient Profile  ? ?51 y/o male with severe systolic HF due to NICM. EF 15-20%. Presented to clinic with low output symptoms and marked fluid overload. Not responding to oral diuretics.  ?  ?ICD interrogated. No VT but HL score 77.  ?  ?Admitted to ICU. Lactate 2.9. Echo w/ large LV thrombus, EF 15%, RV mod reduced, severe TR   ? ?Assessment/Plan  ? ?1. Acute on chronic systolic HF >>Cardiogenic Shock  ?- NICM. [employer] ICD ?- Cath 2016 - no CAD. 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  ?- Echo 10/2016 EF 10-15%. LV dysfunction after stopping HF meds. ?- Echo 03/01/21 EF 20-25% RV moderately reduced ?- cMRI 2/19 EF 19%. RV mildly decreased .  ?- CPX 11/20 with mild functional limitation; no clear HF limitation despite EF<20% ?- admitted with cardiogenic shock and marked fluid overload. Initial lactate 2.9. Started on Milrinone ?- Echo 4/23 EF 15%, + LV thrombus, RV mod reduced, severe TR  ?- R/LHC on 0.125 of milrinone w/ low output (CI 1.4) and high filling pressers, normal cors. Milrinone increased back to 0.25 ?- Not candidate for advanced therapies or home inotropes. He is end-stage. Not transplant candidate with social issues. Likely would not tolerate VAD due to severe RV dysfunction. Not a candidate for home inotropes.  ?- Co-ox was markedly low yesterday and CVP > 20.  He still is not sure what he wants to do going forwards.  Palliative care following.  He wants to meet with palliative care along with his mother and sister tomorrow morning.  Think probably best to continue the milrinone 0.25 + Lasix 80 mg IV bid for  now until decision made about hospice care, then stop milrinone.  This really looks like his only viable option.  Co-ox better at 58% on milrinone and CVP 18 still but weight trending down.  ?- Continue digoxin 0.0625 ?- Continue spiro to 25 daily ?- Continue corlanor 5 mg BID  ?  ?2. LV mural thrombus  ?- He has been off warfarin ?- No thrombus on echo 12/22.  ?- Echo this admit w/ LV thrombus.  ?- Continue Eliquis   ?- No obvious bleeding ?  ?3. H/o ETOH abuse ?- previously heavy drinker . No ETOH since 2021.  ?  ?4.  VT 07/08/17  ?- He is s/p [employer] ICD 07/03/17. ?- Multiple episodes sustained VT on device check 06/621. Mexiletine increased to 300 mg BID. ?-No Vt  ?- Continue mexiletine at current dose. ?- Keep K > 4.0 Mg > 2.0  ?  ?5. 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 ?- Continues to struggle with balance/mobility.  ? ?6. Persistent cough with LLL PNA on CT ?- PCT 0.3 ?- cefepime started 5/6, now off.  ?  ?7. Anxiety/Depression ?- Follows with [facility] [place]. ?  ?8. OSA ?- Has not tolerated CPAP ? ?9. Hemoptysis ?- Hgb stable at 11.6 ?- likely 2/2 pulmonary edema +/- LL PNA ?  ?10. AKI on CKD:  ?-Creatinine baseline previously 1.2-1.4  ?-Creatinine lower at 1.99 back on milrinone.   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  ?11. Hypokalemia  ?- Replace K.  ? ?12. Tachycardia ?- sinus tach  ?- poor prognostic factor ? ?  ?Length of Stay: 17 ? ?Loralie Champagne, MD  ?08/12/2021, 11:34 AM ? ?Advanced Heart Failure Team ?Pager [phone] (M-F; 7a - 5p)  ?Please contact [facility] for night-coverage after hours (5p -7a ) and weekends on [email or web address] ? ?

## 2021-08-12 NOTE — Progress Notes (Signed)
Mobility Specialist Progress Note  ? ? 08/12/21 1017  ?Mobility  ?Activity Ambulated independently in hallway  ?Level of Assistance Modified independent, requires aide device or extra time  ?Assistive Device Four wheel walker  ?Distance Ambulated (ft) 350 ft  ?Activity Response Tolerated well  ?$Mobility charge 1 Mobility  ? ?Post-Mobility: 113 HR ? ?Pt received in chair and agreeable.C/o dull foot pain. Returned to [employer], RN aware.  ? ?Robert Booth ?Mobility Specialist  ?Primary: 5[address] M.S. Phone: [phone] ?Secondary: 6[address] M.S. Phone: [phone] ?  ?

## 2021-08-13 DIAGNOSIS — Z7189 Other specified counseling: Secondary | ICD-10-CM | POA: Diagnosis not present

## 2021-08-13 DIAGNOSIS — I428 Other cardiomyopathies: Secondary | ICD-10-CM | POA: Diagnosis not present

## 2021-08-13 DIAGNOSIS — Z66 Do not resuscitate: Secondary | ICD-10-CM | POA: Diagnosis not present

## 2021-08-13 DIAGNOSIS — I5023 Acute on chronic systolic (congestive) heart failure: Secondary | ICD-10-CM | POA: Diagnosis not present

## 2021-08-13 DIAGNOSIS — Z515 Encounter for palliative care: Secondary | ICD-10-CM | POA: Diagnosis not present

## 2021-08-13 LAB — BASIC METABOLIC PANEL
Anion gap: 9 (ref 5–15)
BUN: 54 mg/dL — ABNORMAL HIGH (ref 6–20)
CO2: 28 mmol/L (ref 22–32)
Calcium: 8.7 mg/dL — ABNORMAL LOW (ref 8.9–10.3)
Chloride: 98 mmol/L (ref 98–111)
Creatinine, Ser: 1.72 mg/dL — ABNORMAL HIGH (ref 0.61–1.24)
GFR, Estimated: 48 mL/min — ABNORMAL LOW (ref >60)
Glucose, Bld: 95 mg/dL (ref 70–99)
Potassium: 3.7 mmol/L (ref 3.5–5.1)
Sodium: 135 mmol/L (ref 135–145)

## 2021-08-13 LAB — COOXEMETRY PANEL
Carboxyhemoglobin: 1 % (ref 0.5–1.5)
Methemoglobin: <0.7 % (ref 0.0–1.5)
O2 Saturation: 49.6 %
Total hemoglobin: 14.8 g/dL (ref 12.0–16.0)

## 2021-08-13 LAB — CBC WITH DIFFERENTIAL/PLATELET
Abs Immature Granulocytes: 0.06 10*3/uL (ref 0.00–0.07)
Basophils Absolute: 0.1 10*3/uL (ref 0.0–0.1)
Basophils Relative: 1 %
Eosinophils Absolute: 0.2 10*3/uL (ref 0.0–0.5)
Eosinophils Relative: 2 %
HCT: 33.4 % — ABNORMAL LOW (ref 39.0–52.0)
Hemoglobin: 11.8 g/dL — ABNORMAL LOW (ref 13.0–17.0)
Immature Granulocytes: 1 %
Lymphocytes Relative: 20 %
Lymphs Abs: 1.8 10*3/uL (ref 0.7–4.0)
MCH: 31.4 pg (ref 26.0–34.0)
MCHC: 35.3 g/dL (ref 30.0–36.0)
MCV: 88.8 fL (ref 80.0–100.0)
Monocytes Absolute: 0.6 10*3/uL (ref 0.1–1.0)
Monocytes Relative: 7 %
Neutro Abs: 6.5 10*3/uL (ref 1.7–7.7)
Neutrophils Relative %: 69 %
Platelets: 362 10*3/uL (ref 150–400)
RBC: 3.76 MIL/uL — ABNORMAL LOW (ref 4.22–5.81)
RDW: 15 % (ref 11.5–15.5)
WBC: 9.2 10*3/uL (ref 4.0–10.5)
nRBC: 0 % (ref 0.0–0.2)

## 2021-08-13 LAB — MAGNESIUM: Magnesium: 2.3 mg/dL (ref 1.7–2.4)

## 2021-08-13 MED ORDER — POTASSIUM CHLORIDE CRYS ER 20 MEQ PO TBCR
40.0000 meq | EXTENDED_RELEASE_TABLET | Freq: Once | ORAL | Status: AC
  Administered 2021-08-13: 40 meq via ORAL
  Filled 2021-08-13: qty 2

## 2021-08-13 NOTE — Progress Notes (Signed)
?  Patient ID: Robert Booth, male   DOB: 1971-01-19, 51 y.o.   MRN: [id number] ? ? ? ?Progress Note from the Palliative Medicine Team at [facility] ? ? ?Patient Name: Robert Booth        ?Date: 08/13/2021 ?DOB: 17-Feb-1971  Age: 51 y.o. MRN#: [id number] ?Attending Physician: Jolaine Artist, MD ?Primary Care Physician: Charlott Rakes, MD ?Admit Date: 07/26/2021 ? ? ?Medical records reviewed  ? ?51 y.o. male   admitted on 07/26/2021 with  ?HFrEF, NICM, [employer] ICD, ETOH, NSVT, gout, LV mural thrombus, and CKD Stage III. 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  ?  ?Over the last few years EF was down to 15% and had improved to 35-40% back in 2016. He stopped taking his medications and EF went back down to 15%.  ?  ?He was seen in the HF clinic 07/05/21. Multiple runs of sustained VT on device check. Mexiletine increased to 300 mg BID. BNP significantly increased (42, >3,000 in 2 months). Scr above baseline at 2.3. Furosemide increased to 40 mg BID.  ? ?Presented to HF clinic for routine f/u. Complaining of fatigue, dyspnea at rest and hemoptysis. Heart Logic Score 76. Admitted from HF clinic with suspected lowput  HF and volume overload. K 6.1, lactic acid 2.9, creatinine 2.3, AST 93 ALT 205, and WBC 15.  ? ? LVAD work-up unfortunately revealed the patient would not tolerate fad due to severe right ventricle dysfunction.  Plan is to begin to wean milrinone, if patient cannot stabilize off inotropes further discussion regarding comfort measures will be necessary.  Milrinone stopped on 5/11 ? ?Unfortunately patient decompensated off of inotropes  and milrinone was restarted on 5/13 ? ?Patient faces treatment option decisions, advanced directive decisions and anticipatory care needs. ? ?I met today with Robert Booth at the bedside, he is comfortable sitting out of bed in the chair.   His sister Lisa/H POA is on the telephone.  Continue conversation regarding current medical situation. ? ?Robert Booth and  his sister tell me that they had a long conversation with Dr. Haroldine Laws this morning.  Deronte outlines options offered by Dr. Haroldine Laws today regarding his natural trajectory of end-stage heart failure as milrinone is weaned offAs discussed. ? ?Discussion was had regarding hospice benefit both in the home versus residential hospice.  Also it was discussed that patient could remain here in the hospital and care would be directed by Dr. Haroldine Laws with the support of the palliative medicine team. ? ?At this time patient and his sister hope for patient to remain in the hospital. ? ?Education offered on the difference between full medical support versus intensive comfort measures. ? ?Education offered on the natural trajectory and expectations at EOL in a patient with end-stage heart failure.    Education offered on utilization of medications to treat symptoms to enhance comfort and dignity. ? ?Plan of care ?-DNR/DNI-- ?-As discussed with Dr. Haroldine Laws patient understands that milrinone will be weaned off, patient will be monitored here in the hospital, and symptoms will be managed as they arise.  Focus of care at that time is comfort and dignity. ? ?Questions and concerns addressed.  ? ?PMT will continue to support holistically. ? ? ?Wadie Lessen NP  ?Palliative Medicine Team Team Phone # [phone] ?Pager [phone] ?  ?

## 2021-08-13 NOTE — Progress Notes (Signed)
This chaplain responded to PMT consult for creating/updating the Pt. Advance Directive.   ? ?The Pt. is awake and sitting in the bedside recliner. The Pt. agreed to AD education and answering clarifying questions with the chaplain.  The Pt. completed the NG:EXBMW and LW. The chaplain requested a notary visit for notarizing of the Pt. AD at 1:30 pm today. ? ?Chaplain Stephanie Acre ?[phone] ?

## 2021-08-13 NOTE — Progress Notes (Addendum)
Patient ID: Robert Booth, male   DOB: [birth year], 51 y.o.   MRN: [id number] ?  ? ? Advanced Heart Failure Rounding Note ? ?PCP-Cardiologist: Dr. Haroldine Laws  ? ?Subjective:   ? ?4/26: Admitted w/ CS + marked volume overload. Echo severe BiV failure EF 15% + large LV thrombus. Placed on milrinone and diuresed w/ IV Lasix.  ?5/1: Cath with normal coronary arteries. Low output (CI 1.4) and high filling pressures (PCWP 30). Moved back to ICU. Milrinone increased. IV Lasix restarted. VAD w/u initiated.  ?05/08: Milrinone decreased to 0.25 ?08/17/2022: Milrinone deceased to 0.125  ?Aug 18, 2022: milrinone stopped . [name] and farxiga added.  ?05/13: Milrinone restarted (Co-ox 32%)  ? ?Co-ox 50% back on milrinone 0.25. 2.1L in UOP yesterday. CVP 11  ? ?Scr 2.12>>1.99>>1.77  ? ?Feels better today. OOB sitting up in chair. No dyspnea. Wants sister to meet w/ palliative care today. ? ? ? ? ?Objective:   ?Weight Range: ?93.2 kg ?Body mass index is 30.34 kg/m?.  ? ?Vital Signs:   ?Temp:  [97.6 ?F (36.4 ?C)-98.7 ?F (37.1 ?C)] 97.6 ?F (36.4 ?C) (05/15 0725) ?Pulse Rate:  [103-104] 103 (05/15 0725) ?Resp:  [14-17] 15 (05/15 0725) ?BP: (99-120)/(77-95) 105/88 (05/15 0725) ?SpO2:  [96 %-99 %] 96 % (05/15 0752) ?Weight:  [93.2 kg] 93.2 kg (05/15 0517) ?Last BM Date : 08/12/21 ? ?Weight change: ?Filed Weights  ? 08/11/21 0500 08/12/21 0421 08/13/21 0517  ?Weight: 93.7 kg 92.4 kg 93.2 kg  ? ? ?Intake/Output:  ? ?Intake/Output Summary (Last 24 hours) at 08/13/2021 0804 ?Last data filed at 08/13/2021 0800 ?Gross per 24 hour  ?Intake 1541.75 ml  ?Output 2100 ml  ?Net -558.25 ml  ? ? ?Physical Exam  ? ?CVP 11  ?General:  Well appearing. No respiratory difficulty ?HEENT: normal ?Neck: supple. JVD 12 cm. Carotids 2+ bilat; no bruits. No lymphadenopathy or thyromegaly appreciated. ?Cor: PMI nondisplaced. Regular rhythm tachy rate. 3/6 TR murmur  ?Lungs: clear ?Abdomen: soft, nontender, nondistended. No hepatosplenomegaly. No bruits or masses. Good bowel  sounds. ?Extremities: no cyanosis, clubbing, rash, edema + RUE PICC  ?Neuro: alert & oriented x 3, cranial nerves grossly intact. moves all 4 extremities w/o difficulty. Affect pleasant. ? ? ?Telemetry  ? ?Sinus Tach 100s (personally reviewed) ? ?Labs  ?  ?CBC ?Recent Labs  ?  08/12/21 ?[id number] 08/13/21 ?0420  ?WBC 10.7* 9.2  ?NEUTROABS 7.6 6.5  ?HGB 11.7* 11.8*  ?HCT 32.8* 33.4*  ?MCV 88.9 88.8  ?PLT 332 362  ? ?Basic Metabolic Panel ?Recent Labs  ?  08/12/21 ?[id number] 08/13/21 ?0420  ?NA 131* 135  ?K 3.3* 3.7  ?CL 92* 98  ?CO2 28 28  ?GLUCOSE 160* 95  ?BUN 57* 54*  ?CREATININE 1.99* 1.72*  ?CALCIUM 9.2 8.7*  ?MG 1.9 2.3  ? ?Liver Function Tests ?No results for input(s): AST, ALT, ALKPHOS, BILITOT, PROT, ALBUMIN in the last 72 hours. ? ?No results for input(s): LIPASE, AMYLASE in the last 72 hours. ?Cardiac Enzymes ?No results for input(s): CKTOTAL, CKMB, CKMBINDEX, TROPONINI in the last 72 hours. ? ?BNP: ?BNP (last 3 results) ?Recent Labs  ?  04/06/21 ?1121 07/05/21 ?[id number] 07/26/21 ?1243  ?BNP 42.4 3,398.7* 1,528.0*  ? ? ?ProBNP (last 3 results) ?No results for input(s): PROBNP in the last 8760 hours. ? ? ?D-Dimer ?No results for input(s): DDIMER in the last 72 hours. ?Hemoglobin A1C ?No results for input(s): HGBA1C in the last 72 hours. ? ?Fasting Lipid Panel ?No results for input(s): CHOL, HDL, LDLCALC, TRIG, CHOLHDL,  LDLDIRECT in the last 72 hours. ? ?Thyroid Function Tests ?No results for input(s): TSH, T4TOTAL, T3FREE, THYROIDAB in the last 72 hours. ? ?Invalid input(s): FREET3 ? ?Other results: ? ? ?Imaging  ? ? ?No results found. ? ? ?Medications:   ? ? ?Scheduled Medications: ? apixaban  5 mg Oral BID  ? atorvastatin  20 mg Oral Daily  ? Chlorhexidine Gluconate Cloth  6 each Topical Daily  ? dapagliflozin propanediol  10 mg Oral Daily  ? feeding supplement  237 mL Oral BID BM  ? furosemide  80 mg Intravenous BID  ? gabapentin  600 mg Oral BID  ? ivabradine  5 mg Oral BID WC  ? mexiletine  300 mg Oral BID  ?  multivitamin with minerals  1 tablet Oral Daily  ? pantoprazole  40 mg Oral Daily  ? potassium chloride  40 mEq Oral BID  ? QUEtiapine  100 mg Oral QHS  ? sertraline  200 mg Oral Daily  ? sodium chloride flush  10-40 mL Intracatheter Q12H  ? ? ?Infusions: ? sodium chloride 10 mL/hr at 08/07/21 2246  ? sodium chloride Stopped (08/10/21 1408)  ? sodium chloride Stopped (08/04/21 1814)  ? milrinone 0.25 mcg/kg/min (08/13/21 0800)  ? ? ?PRN Medications: ?sodium chloride, acetaminophen, guaiFENesin-dextromethorphan, menthol-cetylpyridinium, ondansetron (ZOFRAN) IV, sodium chloride flush ? ? ? ?Patient Profile  ? ?51 y/o male with severe systolic HF due to NICM. EF 15-20%. Presented to clinic with low output symptoms and marked fluid overload. Not responding to oral diuretics.  ?  ?ICD interrogated. No VT but HL score 77.  ?  ?Admitted to ICU. Lactate 2.9. Echo w/ large LV thrombus, EF 15%, RV mod reduced, severe TR   ? ?Assessment/Plan  ? ?1. Acute on chronic systolic HF >>Cardiogenic Shock  ?- NICM. [employer] ICD ?- Cath 2016 - no CAD. 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  ?- Echo 10/2016 EF 10-15%. LV dysfunction after stopping HF meds. ?- Echo 03/01/21 EF 20-25% RV moderately reduced ?- cMRI 2/19 EF 19%. RV mildly decreased .  ?- CPX 11/20 with mild functional limitation; no clear HF limitation despite EF<20% ?- admitted with cardiogenic shock and marked fluid overload. Initial lactate 2.9. Started on Milrinone ?- Echo 4/23 EF 15%, + LV thrombus, RV mod reduced, severe TR  ?- R/LHC on 0.125 of milrinone w/ low output (CI 1.4) and high filling pressers, normal cors. Milrinone increased back to 0.25 ?- Not candidate for advanced therapies or home inotropes. He is end-stage. Not transplant candidate with social issues. Likely would not tolerate VAD due to severe RV dysfunction. Not a candidate for home inotropes.  ?- Failed Milrinone wean x 2. Back on Milrinone 0.25. Co-ox 50%.  ?- He still is not sure what he wants to do going  forwards.  Palliative care following.  He wants to meet with palliative care along with his mother and sister today.  Think probably best to continue the milrinone 0.25 + Lasix 80 mg IV bid for now until decision made about hospice care, then stop milrinone.  This really looks like his only viable option.  ?- CVP 11. Continue IV Lasix ?- Continue digoxin 0.0625 ?- Continue spiro to 25 daily ?- Continue corlanor 5 mg BID  ?  ?2. LV mural thrombus  ?- He has been off warfarin ?- No thrombus on echo 12/22.  ?- Echo this admit w/ LV thrombus.  ?- Continue Eliquis   ?- No obvious bleeding ?  ?3. H/o ETOH abuse ?-  previously heavy drinker . No ETOH since 2021.  ?  ?4. VT 07/08/17  ?- He is s/p [employer] ICD 07/03/17. ?- Multiple episodes sustained VT on device check 06/621. Mexiletine increased to 300 mg BID. ?-No Vt  ?- Continue mexiletine at current dose. ?- Keep K > 4.0 Mg > 2.0  ?  ?5. 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 ?- Continues to struggle with balance/mobility.  ? ?6. Persistent cough with LLL PNA on CT ?- PCT 0.3 ?- cefepime started 5/6, now off.  ?  ?7. Anxiety/Depression ?- Follows with [facility] [place]. ?  ?8. OSA ?- Has not tolerated CPAP ? ?9. Hemoptysis ?- Hgb stable at 11.8 ?- likely 2/2 pulmonary edema +/- LL PNA ?  ?10. AKI on CKD:  ?-Creatinine baseline previously 1.2-1.4  ?-Creatinine lower at 1.72 back on milrinone.   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  ?11. Hypokalemia  ?- Replace K.  ? ?12. Tachycardia ?- sinus tach  ?- poor prognostic factor ? ?Palliative care team to follow up w/ family phone call this morning to discuss further steps/ arrangement of hospice care.  ? ?  ?Length of Stay: 18 ? ?Lyda Jester, PA-C  ?08/13/2021, 8:04 AM ? ?Advanced Heart Failure Team ?Pager [phone] (M-F; 7a - 5p)  ?Please contact [facility] for night-coverage after hours (5p -7a ) and weekends on [email or web address] ? ?Patient seen and examined with the above-signed Advanced Practice  Provider and/or Housestaff. I personally reviewed laboratory data, imaging studies and relevant notes. I independently examined the patient and formulated the important aspects of the plan. I have edited the note to

## 2021-08-13 NOTE — Progress Notes (Signed)
This chaplain is present with the Pt., Pt. mother, friend, notary, and witnesses for the notarizing of the Pt. Advance Directive:  HCPOA and Living Will. ? ?The Pt. is naming [place] as the Pt. HCPOA. ? ?The chaplain gave the Pt. the original AD along with one copy. The chaplain scanned the Pt. AD into his EMR. ? ?This chaplain is available for F/U spiritual care as needed. ? ?Chaplain Stephanie Acre  ?[phone] ?

## 2021-08-14 DIAGNOSIS — I5023 Acute on chronic systolic (congestive) heart failure: Secondary | ICD-10-CM | POA: Diagnosis not present

## 2021-08-14 DIAGNOSIS — Z515 Encounter for palliative care: Secondary | ICD-10-CM | POA: Diagnosis not present

## 2021-08-14 DIAGNOSIS — Z66 Do not resuscitate: Secondary | ICD-10-CM | POA: Diagnosis not present

## 2021-08-14 DIAGNOSIS — I428 Other cardiomyopathies: Secondary | ICD-10-CM | POA: Diagnosis not present

## 2021-08-14 LAB — CBC WITH DIFFERENTIAL/PLATELET
Abs Immature Granulocytes: 0.04 10*3/uL (ref 0.00–0.07)
Basophils Absolute: 0.1 10*3/uL (ref 0.0–0.1)
Basophils Relative: 1 %
Eosinophils Absolute: 0.2 10*3/uL (ref 0.0–0.5)
Eosinophils Relative: 2 %
HCT: 34.3 % — ABNORMAL LOW (ref 39.0–52.0)
Hemoglobin: 11.5 g/dL — ABNORMAL LOW (ref 13.0–17.0)
Immature Granulocytes: 0 %
Lymphocytes Relative: 23 %
Lymphs Abs: 2 10*3/uL (ref 0.7–4.0)
MCH: 30.5 pg (ref 26.0–34.0)
MCHC: 33.5 g/dL (ref 30.0–36.0)
MCV: 91 fL (ref 80.0–100.0)
Monocytes Absolute: 0.6 10*3/uL (ref 0.1–1.0)
Monocytes Relative: 7 %
Neutro Abs: 6 10*3/uL (ref 1.7–7.7)
Neutrophils Relative %: 67 %
Platelets: 384 10*3/uL (ref 150–400)
RBC: 3.77 MIL/uL — ABNORMAL LOW (ref 4.22–5.81)
RDW: 15 % (ref 11.5–15.5)
WBC: 9 10*3/uL (ref 4.0–10.5)
nRBC: 0 % (ref 0.0–0.2)

## 2021-08-14 LAB — COOXEMETRY PANEL
Carboxyhemoglobin: 0.9 % (ref 0.5–1.5)
Methemoglobin: 0.7 % (ref 0.0–1.5)
O2 Saturation: 41.1 %
Total hemoglobin: 14.8 g/dL (ref 12.0–16.0)

## 2021-08-14 LAB — BASIC METABOLIC PANEL
Anion gap: 9 (ref 5–15)
BUN: 46 mg/dL — ABNORMAL HIGH (ref 6–20)
CO2: 28 mmol/L (ref 22–32)
Calcium: 9.2 mg/dL (ref 8.9–10.3)
Chloride: 96 mmol/L — ABNORMAL LOW (ref 98–111)
Creatinine, Ser: 1.78 mg/dL — ABNORMAL HIGH (ref 0.61–1.24)
GFR, Estimated: 46 mL/min — ABNORMAL LOW (ref >60)
Glucose, Bld: 130 mg/dL — ABNORMAL HIGH (ref 70–99)
Potassium: 4.6 mmol/L (ref 3.5–5.1)
Sodium: 133 mmol/L — ABNORMAL LOW (ref 135–145)

## 2021-08-14 LAB — MAGNESIUM: Magnesium: 2.3 mg/dL (ref 1.7–2.4)

## 2021-08-14 MED ORDER — MORPHINE SULFATE (PF) 2 MG/ML IV SOLN: 1.0000 mg | INTRAVENOUS | Status: DC | PRN

## 2021-08-14 MED ORDER — MILRINONE LACTATE IN DEXTROSE 20-5 MG/100ML-% IV SOLN
0.1250 ug/kg/min | INTRAVENOUS | Status: DC
  Administered 2021-08-14: 0.125 ug/kg/min via INTRAVENOUS
  Filled 2021-08-14: qty 100

## 2021-08-14 NOTE — Progress Notes (Signed)
? ?  [place] deactivated ICD.  ? ? ?Tahira Olivarez NP-C  ?2:52 PM ? ?

## 2021-08-14 NOTE — Progress Notes (Addendum)
Patient ID: Robert Booth, male   DOB: 1970/04/03, 51 y.o.   MRN: [id number] ?  ? ? Advanced Heart Failure Rounding Note ? ?PCP-Cardiologist: Dr. Haroldine Laws  ? ?Subjective:   ? ?4/26: Admitted w/ CS + marked volume overload. Echo severe BiV failure EF 15% + large LV thrombus. Placed on milrinone and diuresed w/ IV Lasix.  ?5/1: Cath with normal coronary arteries. Low output (CI 1.4) and high filling pressures (PCWP 30). Moved back to ICU. Milrinone increased. IV Lasix restarted. VAD w/u initiated.  ?05/08: Milrinone decreased to 0.25 ?08-28-2022: Milrinone deceased to 0.125  ?08-29-22: milrinone stopped . [name] and farxiga added.  ?05/13: Milrinone restarted (Co-ox 32%)  ? ?He does not want residential hospice. Requesting to remain in the hospital and transition to comfort.  ? ? ?Objective:   ?Weight Range: ?93.3 kg ?Body mass index is 30.38 kg/m?.  ? ?Vital Signs:   ?Temp:  [97.5 ?F (36.4 ?C)-98.1 ?F (36.7 ?C)] 97.5 ?F (36.4 ?C) (05/16 1148) ?Pulse Rate:  [102] 102 (05/16 0718) ?Resp:  [12-19] 15 (05/16 1200) ?BP: (95-113)/(79-88) 108/88 (05/16 1148) ?SpO2:  [96 %-97 %] 96 % (05/16 1200) ?Weight:  [93.3 kg] 93.3 kg (05/16 0545) ?Last BM Date : 08/13/21 ? ?Weight change: ?Filed Weights  ? 08/12/21 0421 08/13/21 0517 08/14/21 0545  ?Weight: 92.4 kg 93.2 kg 93.3 kg  ? ? ?Intake/Output:  ? ?Intake/Output Summary (Last 24 hours) at 08/14/2021 1247 ?Last data filed at 08/14/2021 1200 ?Gross per 24 hour  ?Intake 684.63 ml  ?Output 1525 ml  ?Net -840.37 ml  ? ? ?Physical Exam  ? ?CVP 8 ?General:  In the chair.  No resp difficulty ?HEENT: normal ?Neck: supple. JVP to jaw. Carotids 2+ bilat; no bruits. No lymphadenopathy or thryomegaly appreciated. ?Cor: PMI nondisplaced. Regular rate & rhythm. No rubs, gallops or murmurs. ?Lungs: clear ?Abdomen: soft, nontender, nondistended. No hepatosplenomegaly. No bruits or masses. Good bowel sounds. ?Extremities: no cyanosis, clubbing, rash, edema. RUE PICC  ?Neuro: alert & orientedx3, cranial  nerves grossly intact. moves all 4 extremities w/o difficulty. Affect pleasant ? ? ?Telemetry  ? ?Sinus Tach 100s  ? ?Labs  ?  ?CBC ?Recent Labs  ?  08/13/21 ?0420 08/14/21 ?0440  ?WBC 9.2 9.0  ?NEUTROABS 6.5 6.0  ?HGB 11.8* 11.5*  ?HCT 33.4* 34.3*  ?MCV 88.8 91.0  ?PLT 362 384  ? ?Basic Metabolic Panel ?Recent Labs  ?  08/13/21 ?0420 08/14/21 ?0440  ?NA 135 133*  ?K 3.7 4.6  ?CL 98 96*  ?CO2 28 28  ?GLUCOSE 95 130*  ?BUN 54* 46*  ?CREATININE 1.72* 1.78*  ?CALCIUM 8.7* 9.2  ?MG 2.3 2.3  ? ?Liver Function Tests ?No results for input(s): AST, ALT, ALKPHOS, BILITOT, PROT, ALBUMIN in the last 72 hours. ? ?No results for input(s): LIPASE, AMYLASE in the last 72 hours. ?Cardiac Enzymes ?No results for input(s): CKTOTAL, CKMB, CKMBINDEX, TROPONINI in the last 72 hours. ? ?BNP: ?BNP (last 3 results) ?Recent Labs  ?  04/06/21 ?1121 07/05/21 ?[id number] 07/26/21 ?1243  ?BNP 42.4 3,398.7* 1,528.0*  ? ? ?ProBNP (last 3 results) ?No results for input(s): PROBNP in the last 8760 hours. ? ? ?D-Dimer ?No results for input(s): DDIMER in the last 72 hours. ?Hemoglobin A1C ?No results for input(s): HGBA1C in the last 72 hours. ? ?Fasting Lipid Panel ?No results for input(s): CHOL, HDL, LDLCALC, TRIG, CHOLHDL, LDLDIRECT in the last 72 hours. ? ?Thyroid Function Tests ?No results for input(s): TSH, T4TOTAL, T3FREE, THYROIDAB in the last 72 hours. ? ?  Invalid input(s): FREET3 ? ?Other results: ? ? ?Imaging  ? ? ?No results found. ? ? ?Medications:   ? ? ?Scheduled Medications: ? apixaban  5 mg Oral BID  ? atorvastatin  20 mg Oral Daily  ? Chlorhexidine Gluconate Cloth  6 each Topical Daily  ? dapagliflozin propanediol  10 mg Oral Daily  ? feeding supplement  237 mL Oral BID BM  ? furosemide  80 mg Intravenous BID  ? gabapentin  600 mg Oral BID  ? ivabradine  5 mg Oral BID WC  ? mexiletine  300 mg Oral BID  ? multivitamin with minerals  1 tablet Oral Daily  ? pantoprazole  40 mg Oral Daily  ? potassium chloride  40 mEq Oral BID  ? QUEtiapine   100 mg Oral QHS  ? sertraline  200 mg Oral Daily  ? sodium chloride flush  10-40 mL Intracatheter Q12H  ? ? ?Infusions: ? sodium chloride 10 mL/hr at 08/07/21 2246  ? sodium chloride Stopped (08/10/21 1408)  ? sodium chloride Stopped (08/04/21 1814)  ? milrinone 0.25 mcg/kg/min (08/14/21 1200)  ? ? ?PRN Medications: ?sodium chloride, acetaminophen, guaiFENesin-dextromethorphan, menthol-cetylpyridinium, ondansetron (ZOFRAN) IV, sodium chloride flush ? ? ? ?Patient Profile  ? ?51 y/o male with severe systolic HF due to NICM. EF 15-20%. Presented to clinic with low output symptoms and marked fluid overload. Not responding to oral diuretics.  ?  ?ICD interrogated. No VT but HL score 77.  ?  ?Admitted to ICU. Lactate 2.9. Echo w/ large LV thrombus, EF 15%, RV mod reduced, severe TR   ? ?Assessment/Plan  ? ?1. Acute on chronic systolic HF >>Cardiogenic Shock  ?- NICM. [employer] ICD ?- Cath 2016 - no CAD. 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  ?- Echo 10/2016 EF 10-15%. LV dysfunction after stopping HF meds. ?- Echo 03/01/21 EF 20-25% RV moderately reduced ?- cMRI 2/19 EF 19%. RV mildly decreased .  ?- CPX 11/20 with mild functional limitation; no clear HF limitation despite EF<20% ?- admitted with cardiogenic shock and marked fluid overload. Initial lactate 2.9. Started on Milrinone ?- Echo 4/23 EF 15%, + LV thrombus, RV mod reduced, severe TR  ?- R/LHC on 0.125 of milrinone w/ low output (CI 1.4) and high filling pressers, normal cors. Milrinone increased back to 0.25 ?- Not candidate for advanced therapies or home inotropes. He is end-stage. Not transplant candidate with social issues. Likely would not tolerate VAD due to severe RV dysfunction. Not a candidate for home inotropes.  ?- Failed Milrinone wean x 2. Back on Milrinone 0.25. Co-ox stable.  ?- Will turn down to 0.125 mcg then off tomorrow. Added prn morphine. ?-  Continue digoxin 0.0625 ?- Continue spiro to 25 daily ?- Continue corlanor 5 mg BID  ?  ?2. LV mural  thrombus  ?- He has been off warfarin ?- No thrombus on echo 12/22.  ?- Echo this admit w/ LV thrombus.  ?- Continue Eliquis   ?- No obvious bleeding ?  ?3. H/o ETOH abuse ?- previously heavy drinker . No ETOH since 2021.  ?  ?4. VT 07/08/17  ?- He is s/p [employer] ICD 07/03/17. ?- Multiple episodes sustained VT on device check 06/621. Mexiletine increased to 300 mg BID. ?-No VT ?- Continue mexiletine at current dose. ?- Keep K > 4.0 Mg > 2.0  ?  ?5. 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 ?- Continues to struggle with balance/mobility.  ? ?6. Persistent cough with LLL PNA on  CT ?- PCT 0.3 ?- cefepime started 5/6, now off.  ?  ?7. Anxiety/Depression ?- Follows with [facility] [place]. ?  ?8. OSA ?- Has not tolerated CPAP ? ?9. Hemoptysis ?- Hgb stable at 11.5 ?- likely 2/2 pulmonary edema +/- LL PNA ?  ?10. AKI on CKD:  ?-Creatinine baseline previously 1.2-1.4  ?-Creatinine 1.78  ?-Likely Cardiorenal  ?  ?11. Hypokalemia  ?- Replace K.  ? ?12. Tachycardia ?- sinus tach  ?- poor prognostic factor ? ?Deactivate ICD - Discussed and he is agreeable.  ? ?Turn down milrinone today to 0.125 mcg. He wants to bring family members to the hospital tomorrow then stop milrinone. I have ordered prn morphine.  ? ?He may need residential hospice.  ?  ?Length of Stay: 19 ? ?Darrick Grinder, NP  ?08/14/2021, 12:47 PM ? ?Advanced Heart Failure Team ?Pager [phone] (M-F; 7a - 5p)  ?Please contact [facility] for night-coverage after hours (5p -7a ) and weekends on [email or web address] ? ?Patient seen and examined with the above-signed Advanced Practice Provider and/or Housestaff. I personally reviewed laboratory data, imaging studies and relevant notes. I independently examined the patient and formulated the important aspects of the plan. I have edited the note to reflect any of my changes or salient points. I have personally discussed the plan with the patient and/or family. ? ?Remains on milrinone. Co-ox remains low.  Feels ok. Denies SOB, orthopnea or PND ? ?General:  Sitting in chair. No resp difficulty ?HEENT: normal ?Neck: supple. no JVD. Carotids 2+ bilat; no bruits. No lymphadenopathy or thryomegaly appreciated. ?Cor: P

## 2021-08-14 NOTE — Progress Notes (Signed)
This chaplain responded to the Pt. request for prayer.  The Pt. is awake sitting in the bedside recliner at the time of the chaplain's visit.  ? ?The chaplain listened reflectively and understands the Pt. is  reflecting on his personal faith journey and desire to reaffirm God's love in the journey towards end of life. The chaplain notices as the Pt. talks about God's love he smiles in the discerning connective moments.  ? ?The Pt. accepted the chaplain's invitation for F/U spiritual care and prayer. ? ?Chaplain Stephanie Acre ?[phone] ?

## 2021-08-15 DIAGNOSIS — I5023 Acute on chronic systolic (congestive) heart failure: Secondary | ICD-10-CM | POA: Diagnosis not present

## 2021-08-15 DIAGNOSIS — R0600 Dyspnea, unspecified: Secondary | ICD-10-CM | POA: Diagnosis not present

## 2021-08-15 DIAGNOSIS — I428 Other cardiomyopathies: Secondary | ICD-10-CM | POA: Diagnosis not present

## 2021-08-15 DIAGNOSIS — R531 Weakness: Secondary | ICD-10-CM | POA: Diagnosis not present

## 2021-08-15 LAB — MAGNESIUM: Magnesium: 2.3 mg/dL (ref 1.7–2.4)

## 2021-08-15 LAB — BASIC METABOLIC PANEL
Anion gap: 9 (ref 5–15)
BUN: 46 mg/dL — ABNORMAL HIGH (ref 6–20)
CO2: 29 mmol/L (ref 22–32)
Calcium: 9.5 mg/dL (ref 8.9–10.3)
Chloride: 97 mmol/L — ABNORMAL LOW (ref 98–111)
Creatinine, Ser: 2.18 mg/dL — ABNORMAL HIGH (ref 0.61–1.24)
GFR, Estimated: 36 mL/min — ABNORMAL LOW (ref >60)
Glucose, Bld: 94 mg/dL (ref 70–99)
Potassium: 5.1 mmol/L (ref 3.5–5.1)
Sodium: 135 mmol/L (ref 135–145)

## 2021-08-15 LAB — CBC WITH DIFFERENTIAL/PLATELET
Abs Immature Granulocytes: 0.04 10*3/uL (ref 0.00–0.07)
Basophils Absolute: 0.1 10*3/uL (ref 0.0–0.1)
Basophils Relative: 1 %
Eosinophils Absolute: 0.2 10*3/uL (ref 0.0–0.5)
Eosinophils Relative: 2 %
HCT: 35.3 % — ABNORMAL LOW (ref 39.0–52.0)
Hemoglobin: 11.8 g/dL — ABNORMAL LOW (ref 13.0–17.0)
Immature Granulocytes: 0 %
Lymphocytes Relative: 26 %
Lymphs Abs: 2.8 10*3/uL (ref 0.7–4.0)
MCH: 30.3 pg (ref 26.0–34.0)
MCHC: 33.4 g/dL (ref 30.0–36.0)
MCV: 90.7 fL (ref 80.0–100.0)
Monocytes Absolute: 0.8 10*3/uL (ref 0.1–1.0)
Monocytes Relative: 7 %
Neutro Abs: 6.9 10*3/uL (ref 1.7–7.7)
Neutrophils Relative %: 64 %
Platelets: 408 10*3/uL — ABNORMAL HIGH (ref 150–400)
RBC: 3.89 MIL/uL — ABNORMAL LOW (ref 4.22–5.81)
RDW: 15.2 % (ref 11.5–15.5)
WBC: 10.8 10*3/uL — ABNORMAL HIGH (ref 4.0–10.5)
nRBC: 0 % (ref 0.0–0.2)

## 2021-08-15 LAB — COOXEMETRY PANEL
Carboxyhemoglobin: <0.3 % — ABNORMAL LOW (ref 0.5–1.5)
Methemoglobin: <0.7 % (ref 0.0–1.5)
O2 Saturation: 26.6 %
Total hemoglobin: 12.5 g/dL (ref 12.0–16.0)

## 2021-08-15 MED ORDER — MILRINONE LACTATE IN DEXTROSE 20-5 MG/100ML-% IV SOLN
0.1250 ug/kg/min | INTRAVENOUS | Status: DC
Start: 2021-08-15 — End: 2021-08-15

## 2021-08-15 MED ORDER — MORPHINE SULFATE (PF) 2 MG/ML IV SOLN: 1.0000 mg | INTRAVENOUS | Status: DC | PRN

## 2021-08-15 NOTE — Progress Notes (Signed)
Milrinone discontinued and PICC line flushed. Visiting with family at the bedside ?

## 2021-08-15 NOTE — Plan of Care (Signed)

## 2021-08-15 NOTE — Progress Notes (Signed)
  Patient ID: Robert Booth, male   DOB: Aug 07, 1970, 51 y.o.   MRN: [id number]    Progress Note from the Palliative Medicine Team at [facility]   Patient Name: Robert Booth        Date: 08/15/2021 DOB: [birth year]  Age: 51 y.o. MRN#: [id number] Attending Physician: Robert Artist, MD Primary Care Physician: Robert Rakes, MD Admit Date: 07/26/2021   Medical records reviewed   51 y.o. male   admitted on 07/26/2021 with  HFrEF, NICM, [employer] ICD, ETOH, NSVT, gout, LV mural thrombus, and CKD Stage III. 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    Over the last few years EF was down to 15% and had improved to 35-40% back in 2016. He stopped taking his medications and EF went back down to 15%.    He was seen in the HF clinic 07/05/21. Multiple runs of sustained VT on device check. Mexiletine increased to 300 mg BID. BNP significantly increased (42, >3,000 in 2 months). Scr above baseline at 2.3. Furosemide increased to 40 mg BID.   Presented to HF clinic for routine f/u. Complaining of fatigue, dyspnea at rest and hemoptysis. Heart Logic Score 76. Admitted from HF clinic with suspected lowput  HF and volume overload. K 6.1, lactic acid 2.9, creatinine 2.3, AST 93 ALT 205, and WBC 15.    LVAD work-up unfortunately revealed the patient would not tolerate fad due to severe right ventricle dysfunction.  Plan is to begin to wean milrinone, if patient cannot stabilize off inotropes further discussion regarding comfort measures will be necessary.  Milrinone stopped on 5/11  Unfortunately patient decompensated off of inotropes  and milrinone was restarted on 5/13  Patient faces treatment option decisions, advanced directive decisions and anticipatory care needs.  I met today with Robert Booth at the bedside, he is comfortable sitting out of bed in the chair.   His sister Robert Booth/H POA, sister/Robert Booth and mother are at the bedside.  Continue conversation regarding current medical  situation.  Family have gathered to meet with Dr. Haroldine Booth today, plan is to stop milrinone and proceed with a comfort path.  Discussion was had regarding hospice benefit both in the home versus residential hospice.  Also it was discussed that patient could remain here in the hospital and care would be directed by Dr. Haroldine Booth with the support of the palliative medicine team.  At this time patient and his family  hope for patient to remain in the hospital.  Education offered on the natural trajectory and expectations at EOL in a patient with end-stage heart failure.    Education offered on utilization of medications to treat symptoms to enhance comfort and dignity.  Plan of care -DNR/DNI-- -As discussed with Dr. Haroldine Booth patient understands that milrinone will be weaned off, patient will be monitored here in the hospital, and symptoms will be managed as they arise.  Focus of care at that time is comfort and dignity.  Questions and concerns addressed.  Emotional support to both patient and family  Any changes in treatment plan will be dependent on outcomes over the next several days.  Ultimately comfort and dignity are the focus of care.  PMT will continue to support holistically.   Robert Lessen NP  Palliative Medicine Team Team Phone # [phone] Pager [phone]

## 2021-08-15 NOTE — Progress Notes (Addendum)
Patient ID: Robert Booth, male   DOB: [birth year], 51 y.o.   MRN: [id number] ?  ? ? Advanced Heart Failure Rounding Note ? ?PCP-Cardiologist: Dr. Haroldine Laws  ? ?Subjective:   ? ?4/26: Admitted w/ CS + marked volume overload. Echo severe BiV failure EF 15% + large LV thrombus. Placed on milrinone and diuresed w/ IV Lasix.  ?5/1: Cath with normal coronary arteries. Low output (CI 1.4) and high filling pressures (PCWP 30). Moved back to ICU. Milrinone increased. IV Lasix restarted. VAD w/u initiated.  ?05/08: Milrinone decreased to 0.25 ?08/29/2022: Milrinone deceased to 0.125  ?08-30-22: milrinone stopped . [name] and farxiga added.  ?05/13: Milrinone restarted (Co-ox 32%)  ?05/16: ICD deactivated. Milrinone cut back to 0.125 mcg ? ?CO-OX 27% on milrinone 0.125 mcg.  ? ? ?Asking if I have more bad news. Says he has had 5 wives and lived a full life.  ?  ? ? ?Objective:   ?Weight Range: ?93.6 kg ?Body mass index is 30.47 kg/m?.  ? ?Vital Signs:   ?Temp:  [97.5 ?F (36.4 ?C)-98.4 ?F (36.9 ?C)] 97.8 ?F (36.6 ?C) (05/17 0741) ?Pulse Rate:  [97-115] 97 (05/17 0403) ?Resp:  [11-20] 11 (05/17 0741) ?BP: (93-115)/(73-91) 105/86 (05/17 0741) ?SpO2:  [95 %-97 %] 96 % (05/17 0741) ?Weight:  [93.6 kg] 93.6 kg (05/17 0409) ?Last BM Date : 08/14/21 ? ?Weight change: ?Filed Weights  ? 08/13/21 0517 08/14/21 0545 08/15/21 0409  ?Weight: 93.2 kg 93.3 kg 93.6 kg  ? ? ?Intake/Output:  ? ?Intake/Output Summary (Last 24 hours) at 08/15/2021 1017 ?Last data filed at 08/15/2021 0700 ?Gross per 24 hour  ?Intake 1232.83 ml  ?Output 1825 ml  ?Net -592.17 ml  ? ? ?Physical Exam  ?General:  In the chair. . No resp difficulty ?HEENT: normal ?Neck: supple. JVP 9-10 . Carotids 2+ bilat; no bruits. No lymphadenopathy or thryomegaly appreciated. ?Cor: PMI nondisplaced. Regular rate & rhythm. No rubs or murmurs. + S3  ?Lungs: clear ?Abdomen: soft, nontender, nondistended. No hepatosplenomegaly. No bruits or masses. Good bowel sounds. ?Extremities: no cyanosis,  clubbing, rash, edema. RUE PICC  ?Neuro: alert & orientedx3, cranial nerves grossly intact. moves all 4 extremities w/o difficulty. Affect pleasant ? ?Telemetry  ? ?Off the monitor.  ?Labs  ?  ?CBC ?Recent Labs  ?  08/14/21 ?0440 08/15/21 ?0415  ?WBC 9.0 10.8*  ?NEUTROABS 6.0 6.9  ?HGB 11.5* 11.8*  ?HCT 34.3* 35.3*  ?MCV 91.0 90.7  ?PLT 384 408*  ? ?Basic Metabolic Panel ?Recent Labs  ?  08/14/21 ?0440 08/15/21 ?0415  ?NA 133* 135  ?K 4.6 5.1  ?CL 96* 97*  ?CO2 28 29  ?GLUCOSE 130* 94  ?BUN 46* 46*  ?CREATININE 1.78* 2.18*  ?CALCIUM 9.2 9.5  ?MG 2.3 2.3  ? ?Liver Function Tests ?No results for input(s): AST, ALT, ALKPHOS, BILITOT, PROT, ALBUMIN in the last 72 hours. ? ?No results for input(s): LIPASE, AMYLASE in the last 72 hours. ?Cardiac Enzymes ?No results for input(s): CKTOTAL, CKMB, CKMBINDEX, TROPONINI in the last 72 hours. ? ?BNP: ?BNP (last 3 results) ?Recent Labs  ?  04/06/21 ?1121 07/05/21 ?[id number] 07/26/21 ?1243  ?BNP 42.4 3,398.7* 1,528.0*  ? ? ?ProBNP (last 3 results) ?No results for input(s): PROBNP in the last 8760 hours. ? ? ?D-Dimer ?No results for input(s): DDIMER in the last 72 hours. ?Hemoglobin A1C ?No results for input(s): HGBA1C in the last 72 hours. ? ?Fasting Lipid Panel ?No results for input(s): CHOL, HDL, LDLCALC, TRIG, CHOLHDL, LDLDIRECT in the last  72 hours. ? ?Thyroid Function Tests ?No results for input(s): TSH, T4TOTAL, T3FREE, THYROIDAB in the last 72 hours. ? ?Invalid input(s): FREET3 ? ?Other results: ? ? ?Imaging  ? ? ?No results found. ? ? ?Medications:   ? ? ?Scheduled Medications: ? apixaban  5 mg Oral BID  ? atorvastatin  20 mg Oral Daily  ? Chlorhexidine Gluconate Cloth  6 each Topical Daily  ? dapagliflozin propanediol  10 mg Oral Daily  ? feeding supplement  237 mL Oral BID BM  ? furosemide  80 mg Intravenous BID  ? gabapentin  600 mg Oral BID  ? ivabradine  5 mg Oral BID WC  ? mexiletine  300 mg Oral BID  ? multivitamin with minerals  1 tablet Oral Daily  ? pantoprazole  40  mg Oral Daily  ? potassium chloride  40 mEq Oral BID  ? QUEtiapine  100 mg Oral QHS  ? sertraline  200 mg Oral Daily  ? sodium chloride flush  10-40 mL Intracatheter Q12H  ? ? ?Infusions: ? sodium chloride 10 mL/hr at 08/07/21 2246  ? sodium chloride Stopped (08/10/21 1408)  ? sodium chloride Stopped (08/04/21 1814)  ? milrinone 0.125 mcg/kg/min (08/15/21 0403)  ? ? ?PRN Medications: ?sodium chloride, acetaminophen, guaiFENesin-dextromethorphan, menthol-cetylpyridinium, morphine injection, ondansetron (ZOFRAN) IV, sodium chloride flush ? ? ? ?Patient Profile  ? ?51 y/o male with severe systolic HF due to NICM. EF 15-20%. Presented to clinic with low output symptoms and marked fluid overload. Not responding to oral diuretics.  ?  ?ICD interrogated. No VT but HL score 77.  ?  ?Admitted to ICU. Lactate 2.9. Echo w/ large LV thrombus, EF 15%, RV mod reduced, severe TR   ? ?Assessment/Plan  ? ?1. Acute on chronic systolic HF >>Cardiogenic Shock  ?- NICM. [employer] ICD ?- Cath 2016 - no CAD. 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  ?- Echo 10/2016 EF 10-15%. LV dysfunction after stopping HF meds. ?- Echo 03/01/21 EF 20-25% RV moderately reduced ?- cMRI 2/19 EF 19%. RV mildly decreased .  ?- CPX 11/20 with mild functional limitation; no clear HF limitation despite EF<20% ?- admitted with cardiogenic shock and marked fluid overload. Initial lactate 2.9. Started on Milrinone ?- Echo 4/23 EF 15%, + LV thrombus, RV mod reduced, severe TR  ?- R/LHC on 0.125 of milrinone w/ low output (CI 1.4) and high filling pressers, normal cors. Milrinone increased back to 0.25 ?- Not candidate for advanced therapies or home inotropes. He is end-stage. Not transplant candidate with social issues. Likely would not tolerate VAD due to severe RV dysfunction. Not a candidate for home inotropes.  ?- Failed Milrinone wean x 2. Placed back on Milrinone 0.25. ?- Now weaned to Milrinone 0.125 mcg with plan to transition to comfort care after family returns  today.  ?-CO-OX 27% .  ?-  Volume status trending up. Continue IV lasix.  ?-  Continue corlanor 5 mg BID  ?  ?2. LV mural thrombus  ?- He has been off warfarin ?- No thrombus on echo 12/22.  ?- Echo this admit w/ LV thrombus.  ?- Continue Eliquis   ?- No obvious bleeding ?  ?3. H/o ETOH abuse ?- previously heavy drinker . No ETOH since 2021.  ?  ?4. VT 07/08/17  ?- He is s/p [facility] ICD 07/03/17. ICD deactivated 08/14/21  ?- Multiple episodes sustained VT on device check 06/621. Mexiletine increased to 300 mg BID. ?- No VT.  ?- Continue mexiletine at current dose. ?-  Keep K > 4.0 Mg > 2.0  ?  ?5. 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 ?- Continues to struggle with balance/mobility.  ? ?6. Persistent cough with LLL PNA on CT ?- PCT 0.3 ?- cefepime started 5/6, now off.  ?  ?7. Anxiety/Depression ?- Follows with [facility] [place]. ?  ?8. OSA ?- Has not tolerated CPAP ? ?9. Hemoptysis ?- Hgb stable at 11.5 ?- likely 2/2 pulmonary edema +/- LL PNA ?  ?10. AKI on CKD:  ?-Creatinine baseline previously 1.2-1.4  ?-Creatinine 1.78 -->2.2 today  ?-Likely Cardiorenal  ?  ?11. Hypokalemia  ?- Resolved.  ?-Stop K supplement s ? ?12. Tachycardia ?- sinus tach  ?- poor prognostic factor ? ?5/16 Deactivated ICD  ? ?Family returning today at 32  to talk with Dr Haroldine Laws about transitioning to comfort care.  ?  ?Length of Stay: 20 ? ?Darrick Grinder, NP  ?08/15/2021, 10:17 AM ? ?Advanced Heart Failure Team ?Pager [phone] (M-F; 7a - 5p)  ?Please contact [facility] for night-coverage after hours (5p -7a ) and weekends on [email or web address] ? ?Agree with above.  ? ?Remains on milrinone 0.125. Feels ok. Weak. Denies SOB, orthopnea or PND.  ? ?Co-ox 26% ? ?General:  Weak appearing. No resp difficulty ?HEENT: normal ?Neck: supple. JVP 10. Carotids 2+ bilat; no bruits. No lymphadenopathy or thryomegaly appreciated. ?Cor: PMI nondisplaced. Regular tachy +s3 ?Lungs: clear ?Abdomen: soft, nontender, nondistended. No  hepatosplenomegaly. No bruits or masses. Good bowel sounds. ?Extremities: no cyanosis, clubbing, rash, trace edema  cool ?Neuro: alert & orientedx3, cranial nerves grossly intact. moves all 4 extremities w/o

## 2021-08-16 ENCOUNTER — Telehealth: Payer: Self-pay | Admitting: Physician Assistant

## 2021-08-16 LAB — CBC WITH DIFFERENTIAL/PLATELET
Abs Immature Granulocytes: 0.07 10*3/uL (ref 0.00–0.07)
Basophils Absolute: 0.1 10*3/uL (ref 0.0–0.1)
Basophils Relative: 1 %
Eosinophils Absolute: 0.2 10*3/uL (ref 0.0–0.5)
Eosinophils Relative: 1 %
HCT: 34.6 % — ABNORMAL LOW (ref 39.0–52.0)
Hemoglobin: 11.9 g/dL — ABNORMAL LOW (ref 13.0–17.0)
Immature Granulocytes: 1 %
Lymphocytes Relative: 22 %
Lymphs Abs: 2.6 10*3/uL (ref 0.7–4.0)
MCH: 30.7 pg (ref 26.0–34.0)
MCHC: 34.4 g/dL (ref 30.0–36.0)
MCV: 89.2 fL (ref 80.0–100.0)
Monocytes Absolute: 0.8 10*3/uL (ref 0.1–1.0)
Monocytes Relative: 7 %
Neutro Abs: 8.2 10*3/uL — ABNORMAL HIGH (ref 1.7–7.7)
Neutrophils Relative %: 68 %
Platelets: 367 10*3/uL (ref 150–400)
RBC: 3.88 MIL/uL — ABNORMAL LOW (ref 4.22–5.81)
RDW: 15.4 % (ref 11.5–15.5)
WBC: 11.8 10*3/uL — ABNORMAL HIGH (ref 4.0–10.5)
nRBC: 0 % (ref 0.0–0.2)

## 2021-08-16 LAB — BASIC METABOLIC PANEL
Anion gap: 12 (ref 5–15)
BUN: 50 mg/dL — ABNORMAL HIGH (ref 6–20)
CO2: 26 mmol/L (ref 22–32)
Calcium: 9.3 mg/dL (ref 8.9–10.3)
Chloride: 96 mmol/L — ABNORMAL LOW (ref 98–111)
Creatinine, Ser: 2.39 mg/dL — ABNORMAL HIGH (ref 0.61–1.24)
GFR, Estimated: 32 mL/min — ABNORMAL LOW (ref >60)
Glucose, Bld: 93 mg/dL (ref 70–99)
Potassium: 4.3 mmol/L (ref 3.5–5.1)
Sodium: 134 mmol/L — ABNORMAL LOW (ref 135–145)

## 2021-08-16 LAB — COOXEMETRY PANEL
Carboxyhemoglobin: 0.6 % (ref 0.5–1.5)
Methemoglobin: 0.9 % (ref 0.0–1.5)
O2 Saturation: 27.9 %
Total hemoglobin: 12.7 g/dL (ref 12.0–16.0)

## 2021-08-16 LAB — MAGNESIUM: Magnesium: 2.3 mg/dL (ref 1.7–2.4)

## 2021-08-16 MED ORDER — FUROSEMIDE 10 MG/ML IJ SOLN: 80.0000 mg | Freq: Two times a day (BID) | INTRAMUSCULAR | 0 refills | Status: DC

## 2021-08-16 MED ORDER — HEPARIN SOD (PORK) LOCK FLUSH 100 UNIT/ML IV SOLN
250.0000 [IU] | INTRAVENOUS | Status: AC | PRN
  Administered 2021-08-16: 250 [IU]

## 2021-08-16 MED ORDER — FUROSEMIDE 80 MG PO TABS: 80.0000 mg | ORAL_TABLET | Freq: Every day | ORAL | 1 refills | Status: AC

## 2021-08-16 MED ORDER — FUROSEMIDE 10 MG/ML IJ SOLN: 80.0000 mg | Freq: Every day | INTRAMUSCULAR | 0 refills | Status: DC

## 2021-08-16 NOTE — Plan of Care (Signed)
  Problem: Education: Goal: Knowledge of General Education information will improve Description: Including pain rating scale, medication(s)/side effects and non-pharmacologic comfort measures Outcome: Progressing   Problem: Nutrition: Goal: Adequate nutrition will be maintained Outcome: Progressing   Problem: Coping: Goal: Level of anxiety will decrease Outcome: Progressing   Problem: Elimination: Goal: Will not experience complications related to bowel motility Outcome: Progressing Goal: Will not experience complications related to urinary retention Outcome: Progressing   Problem: Safety: Goal: Ability to remain free from injury will improve Outcome: Progressing   

## 2021-08-16 NOTE — Telephone Encounter (Signed)
Paged after hours by pharmacy.  Pharmacy does not have IV Lasix.  Reviewed discharge summary.  Sending new prescription of p.o. Lasix 80 mg daily.

## 2021-08-16 NOTE — Progress Notes (Signed)
Manufacturing engineer [facility])  Received request for hospice services at discharge.  Met with patient and his daughter at the bedside.  Discussed hospice, answered questions.  No DME needed currently. He does have a rollator and CPAP machine in the home.  His daughter will take him home POV today.  Hospice will be in the home at 2 pm to enroll him in hospice services.  Thank you, Venia Carbon DNP, RN [facility] Liaison

## 2021-08-16 NOTE — Progress Notes (Signed)
All details for home hospice complete. They will be at the house at 1400.  Discharge instructions reviewed with patient and daughter.Both verbalize understanding of home instructions. Patient via wheelchair in stable condition to daughter's waiting car. Patient will be staying at his sister Lisa's home.

## 2021-08-16 NOTE — Plan of Care (Signed)

## 2021-08-16 NOTE — Progress Notes (Addendum)
Patient ID: Robert Booth, male   DOB: 1970/09/13, 51 y.o.   MRN: [id number]     Advanced Heart Failure Rounding Note  PCP-Cardiologist: Dr. Haroldine Laws   Subjective:    4/26: Admitted w/ CS + marked volume overload. Echo severe BiV failure EF 15% + large LV thrombus. Placed on milrinone and diuresed w/ IV Lasix.  5/1: Cath with normal coronary arteries. Low output (CI 1.4) and high filling pressures (PCWP 30). Moved back to ICU. Milrinone increased. IV Lasix restarted. VAD w/u initiated.  05/08: Milrinone decreased to 0.25 09/07/2022: Milrinone deceased to 0.125  09-08-2022: milrinone stopped . [name] and farxiga added.  05/13: Milrinone restarted (Co-ox 32%)  05/16: ICD deactivated. Milrinone cut back to 0.125 mcg 5/17: Milrinone discontinued   Off Milrinone. Co-ox 28%   SCr 2.18>>2.39   A&Ox3. Sitting up in chair watching TV. Comfortable w/o dyspnea. Denies pain.     Objective:   Weight Range: 94.2 kg Body mass index is 30.67 kg/m.   Vital Signs:   Temp:  [97.6 F (36.4 C)-98.6 F (37 C)] 98.6 F (37 C) (05/18 0712) Pulse Rate:  [104-107] 104 (05/17 2341) Resp:  [16-20] 16 (05/18 0712) BP: (111-130)/(85-102) 130/102 (05/18 0712) SpO2:  [96 %-99 %] 97 % (05/18 0712) Weight:  [94.2 kg] 94.2 kg (05/18 0400) Last BM Date : 08/14/21  Weight change: Filed Weights   08/14/21 0545 08/15/21 0409 08/16/21 0400  Weight: 93.3 kg 93.6 kg 94.2 kg    Intake/Output:   Intake/Output Summary (Last 24 hours) at 08/16/2021 0922 Last data filed at 08/15/2021 2145 Gross per 24 hour  Intake 754.98 ml  Output 200 ml  Net 554.98 ml    Physical Exam   General:  Well appearing. No respiratory difficulty HEENT: normal Neck: supple. JVD elevated to jaw. Carotids 2+ bilat; no bruits. No lymphadenopathy or thyromegaly appreciated. Cor: PMI nondisplaced. Regular rhythm, tachy rate. No rubs, gallops or murmurs. Lungs: clear Abdomen: obese, distended, nontender. No hepatosplenomegaly. No bruits  or masses. Good bowel sounds. Extremities: no cyanosis, clubbing, rash, edema, cool distal extremities + RUE PICC  Neuro: alert & oriented x 3, cranial nerves grossly intact. moves all 4 extremities w/o difficulty. Affect pleasant.   Telemetry   Sinus tach, low 100s   Labs    CBC Recent Labs    08/15/21 0415 08/16/21 0512  WBC 10.8* 11.8*  NEUTROABS 6.9 8.2*  HGB 11.8* 11.9*  HCT 35.3* 34.6*  MCV 90.7 89.2  PLT 408* [id number]   Basic Metabolic Panel Recent Labs    08/15/21 0415 08/16/21 0512  NA 135 134*  K 5.1 4.3  CL 97* 96*  CO2 29 26  GLUCOSE 94 93  BUN 46* 50*  CREATININE 2.18* 2.39*  CALCIUM 9.5 9.3  MG 2.3 2.3   Liver Function Tests No results for input(s): AST, ALT, ALKPHOS, BILITOT, PROT, ALBUMIN in the last 72 hours.  No results for input(s): LIPASE, AMYLASE in the last 72 hours. Cardiac Enzymes No results for input(s): CKTOTAL, CKMB, CKMBINDEX, TROPONINI in the last 72 hours.  BNP: BNP (last 3 results) Recent Labs    04/06/21 1121 07/05/21 0947 07/26/21 1243  BNP 42.4 3,398.7* 1,528.0*    ProBNP (last 3 results) No results for input(s): PROBNP in the last 8760 hours.   D-Dimer No results for input(s): DDIMER in the last 72 hours. Hemoglobin A1C No results for input(s): HGBA1C in the last 72 hours.  Fasting Lipid Panel No results for input(s): CHOL, HDL, LDLCALC, TRIG,  CHOLHDL, LDLDIRECT in the last 72 hours.  Thyroid Function Tests No results for input(s): TSH, T4TOTAL, T3FREE, THYROIDAB in the last 72 hours.  Invalid input(s): FREET3  Other results:   Imaging    No results found.   Medications:     Scheduled Medications:  apixaban  5 mg Oral BID   atorvastatin  20 mg Oral Daily   Chlorhexidine Gluconate Cloth  6 each Topical Daily   dapagliflozin propanediol  10 mg Oral Daily   feeding supplement  237 mL Oral BID BM   furosemide  80 mg Intravenous BID   gabapentin  600 mg Oral BID   ivabradine  5 mg Oral BID WC    mexiletine  300 mg Oral BID   multivitamin with minerals  1 tablet Oral Daily   pantoprazole  40 mg Oral Daily   QUEtiapine  100 mg Oral QHS   sertraline  200 mg Oral Daily   sodium chloride flush  10-40 mL Intracatheter Q12H    Infusions:  sodium chloride 10 mL/hr at 08/07/21 2246   sodium chloride Stopped (08/10/21 1408)   sodium chloride Stopped (08/04/21 1814)    PRN Medications: sodium chloride, acetaminophen, guaiFENesin-dextromethorphan, menthol-cetylpyridinium, morphine injection, ondansetron (ZOFRAN) IV, sodium chloride flush    Patient Profile   51 y/o male with severe systolic HF due to NICM. EF 15-20%. Presented to clinic with low output symptoms and marked fluid overload. Not responding to oral diuretics.    ICD interrogated. No VT but HL score 77.    Admitted to ICU. Lactate 2.9. Echo w/ large LV thrombus, EF 15%, RV mod reduced, severe TR    Assessment/Plan   1. Acute on chronic systolic HF >>Cardiogenic Shock  - NICM. [employer] ICD - Cath 2016 - no CAD. 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  - Echo 10/2016 EF 10-15%. LV dysfunction after stopping HF meds. - Echo 03/01/21 EF 20-25% RV moderately reduced - cMRI 2/19 EF 19%. RV mildly decreased .  - CPX 11/20 with mild functional limitation; no clear HF limitation despite EF<20% - admitted with cardiogenic shock and marked fluid overload. Initial lactate 2.9. Started on Milrinone - Echo 4/23 EF 15%, + LV thrombus, RV mod reduced, severe TR  - R/LHC on 0.125 of milrinone w/ low output (CI 1.4) and high filling pressers, normal cors. Milrinone increased back to 0.25 - Not candidate for advanced therapies or home inotropes. He is end-stage. Not transplant candidate with social issues. Likely would not tolerate VAD due to severe RV dysfunction. Not a candidate for home inotropes.  - Failed Milrinone wean x 3. Co-ox 28% off milrinone  - Transitioned to comfort care. ICD deactivated  - Currently comfortable at rest w/o  dyspnea. Continue IV Lasix 80 mg bid  - Low threshold to start comfort meds to keep him comfortable.    2. LV mural thrombus  - He has been off warfarin - No thrombus on echo 12/22.  - Echo this admit w/ LV thrombus.  - Continue Eliquis   - No obvious bleeding   3. H/o ETOH abuse - previously heavy drinker . No ETOH since 2021.    4. VT 07/08/17  - He is s/p [employer] ICD 07/03/17. ICD deactivated 08/14/21  - Multiple episodes sustained VT on device check 06/621. Mexiletine increased to 300 mg BID. - No VT.  - Continue mexiletine at current dose. - Keep K > 4.0 Mg > 2.0    5. PAD s/p Bilateral TMA - 05/2019 2/2  gangrene/blue toe syndrome. - Continues to struggle with balance/mobility.   6. Persistent cough with LLL PNA on CT - PCT 0.3 - cefepime started 5/6, now off.    7. Anxiety/Depression - Follows with [facility] [place].   8. OSA - Has not tolerated CPAP  9. Hemoptysis - Hgb stable at 11.5 - likely 2/2 pulmonary edema +/- LL PNA   10. AKI on CKD:  -Creatinine baseline previously 1.2-1.4  -Creatinine 1.78 -->2.4 today  -Likely Cardiorenal    11. Hypokalemia  -Resolved.  -Stop K supplements  12. Tachycardia - sinus tach  - poor prognostic factor  Monitor closely. Low threshold to start comfort meds to keep him comfortable.     Length of Stay: [address] PA-C  08/16/2021, 9:22 AM  Advanced Heart Failure Team Pager [phone] (M-F; 7a - 5p)  Please contact [facility] for night-coverage after hours (5p -7a ) and weekends on [email or web address]  Patient seen and examined with the above-signed Advanced Practice Provider and/or Housestaff. I personally reviewed laboratory data, imaging studies and relevant notes. I independently examined the patient and formulated the important aspects of the plan. I have edited the note to reflect any of my changes or salient points. I have personally discussed the plan with the patient and/or family.  Feels ok  but co-ox and Scr worse. Says he wants to go home with hospice.   General:  Sick appearing. No resp difficulty HEENT: normal Neck: supple. JVP to ear. Carotids 2+ bilat; no bruits. No lymphadenopathy or thryomegaly appreciated. Cor: PMI nondisplaced. Regular tachycardia +s3 Lungs: clear Abdomen: soft, nontender, nondistended. No hepatosplenomegaly. No bruits or masses. Good bowel sounds. Extremities: no cyanosis, clubbing, rash, 1+ edema Cool  Neuro: alert & orientedx3, cranial nerves grossly intact. moves all 4 extremities w/o difficulty. Affect pleasant  He has end-stage biventricular HF. Very tenuous. He is requesting d/c home with Hospice. We will arrange.   Glori Bickers, MD  12:41 PM

## 2021-08-16 NOTE — TOC Initial Note (Addendum)
Transition of Care [facility]) - Initial/Assessment Note    Patient Details  Name: Robert Booth MRN: [id number] Date of Birth: Jun 30, 1970  Transition of Care [facility]) CM/SW Contact:    Elliot Cousin, RN Phone Number: [phone] 08/16/2021, 11:40 AM  Clinical Narrative:                 HF TOC CM spoke to pt at bedside, spoke to sister by phone, Misty Stanley . Offered choice for Home Hospice. Pt agreeable to [facility] for Home Hospice. States he will stay with his sister. Pt has a Rollator in the room. Has CPAP at home. Contacted Hospice rep, Danford Bad and they can do an admission at 2 pm today. Pt agreeable. Notified attending.   Clarified Lasix 80 mg IV qd per Dr Gala Romney  Expected Discharge Plan: Home w Hospice Care Barriers to Discharge: No Barriers Identified   Patient Goals and CMS Choice Patient states their goals for this hospitalization and ongoing recovery are:: states he feels fine and wants to go home with family CMS Medicare.gov Compare Post Acute Care list provided to:: Patient Choice offered to / list presented to : Patient  Expected Discharge Plan and Services Expected Discharge Plan: Home w Hospice Care   Discharge Planning Services: CM Consult Post Acute Care Choice: Hospice Living arrangements for the past 2 months: Single Family Home                               Date [facility] Agency Contacted: 08/16/21 Time HH Agency Contacted: 1139 Representative spoke with at [facility] Agency: Shanita  Prior Living Arrangements/Services Living arrangements for the past 2 months: Single Family Home Lives with:: Siblings Patient language and need for interpreter reviewed:: Yes Do you feel safe going back to the place where you live?: Yes        Care giver support system in place?: No (comment) Current home services: DME (Rollator) Criminal Activity/Legal Involvement Pertinent to Current Situation/Hospitalization: No - Comment as needed  Activities of Daily Living Home Assistive  Devices/Equipment: None ADL Screening (condition at time of admission) Patient's cognitive ability adequate to safely complete daily activities?: Yes Is the patient deaf or have difficulty hearing?: No Does the patient have difficulty seeing, even when wearing glasses/contacts?: No Does the patient have difficulty concentrating, remembering, or making decisions?: No Patient able to express need for assistance with ADLs?: Yes Does the patient have difficulty dressing or bathing?: No Independently performs ADLs?: Yes (appropriate for developmental age) Does the patient have difficulty walking or climbing stairs?: No Weakness of Legs: None Weakness of Arms/Hands: None  Permission Sought/Granted Permission sought to share information with : Case Manager, Family Supports, PCP Permission granted to share information with : Yes, Verbal Permission Granted  Share Information with NAME: Mignon Pine  Permission granted to share info w AGENCY: Home Hospice  Permission granted to share info w Relationship: sister  Permission granted to share info w Contact Information: [phone]  Emotional Assessment Appearance:: Appears stated age Attitude/Demeanor/Rapport: Engaged Affect (typically observed): Accepting Orientation: : Oriented to Self, Oriented to  Time, Oriented to Place, Oriented to Situation   Psych Involvement: No (comment)  Admission diagnosis:  Acute on chronic systolic CHF (congestive heart failure) (HCC) [I50.23] Patient Active Problem List   Diagnosis Date Noted   Acute on chronic systolic CHF (congestive heart failure) (HCC) 07/26/2021   Hyperkalemia 07/26/2021   Anxiety and depression 02/13/2021   Phantom pain (HCC)  02/13/2021   History of transmetatarsal amputation of foot (HCC) 02/13/2021   Ventricular tachycardia (HCC) 07/09/2017   AICD (automatic cardioverter/defibrillator) present 07/09/2017   Hypoglycemia 07/09/2017   Defibrillator discharge    Long term (current) use  of anticoagulants [Z79.01] 03/06/2017   NICM (nonischemic cardiomyopathy) (HCC) 12/01/2016   H/O ETOH abuse 12/14/2014   Chronic systolic heart failure (HCC) 11/24/2014   Elevated LFTs    Anticoagulated on Coumadin 11/08/2014   Visual disturbance 11/08/2014   History of alcohol abuse    LV (left ventricular) mural thrombus without MI (HCC)    Nonischemic cardiomyopathy (HCC) 08/18/2014   HTN (hypertension) 08/18/2014   PCP:  Hoy Register, MD Pharmacy:   [facility] - [place], [place] - [address] [place] [address] Phone: [phone] Fax: [phone]     Social Determinants of Health (SDOH) Interventions    Readmission Risk Interventions     View : No data to display.

## 2021-08-16 NOTE — Progress Notes (Signed)
Nutrition Brief Note  Chart reviewed. Per HF team note, pt has transitioned to comfort care. ICD deactivated and milrinone discontinued. Low threshold to start comfort meds to keep him comfortable. No further nutrition interventions planned at this time.  Please re-consult as needed.    Gustavus Bryant, MS, RD, LDN Inpatient Clinical Dietitian Please see AMiON for contact information.

## 2021-08-17 NOTE — TOC CM/SW Note (Addendum)
08/17/2021 1030 am HF TOC CM received call from Davy, Olivia Mackie and states their pharmacy will provide IV Lasix and they Hospice RN will go out each day to administer medication.Jonnie Finner RN3 CCM, Heart Failure TOC CM [phone]      08/17/2021 1015 am HF TOC CM contacted pt and states he is doing well at home. His mother is staying with him while his sister is at work. [facility] completed his admission on yesterday at 2 pm. They ordered his hospital bed, bedside table and shower chair to be delivered to home today. Contacted [facility] rep, Venia Carbon to follow up on IV Lasix Rx. Pt stated the pharmacy filled his a po Rx for Lasix while [facility] worked on IV Lasix Rx today. [place], Heart Failure TOC CM [phone]

## 2021-08-23 ENCOUNTER — Ambulatory Visit: Payer: Medicaid Other | Admitting: Family Medicine

## 2021-08-24 ENCOUNTER — Telehealth (HOSPITAL_COMMUNITY): Payer: Self-pay

## 2021-08-24 NOTE — Telephone Encounter (Signed)
Forms faxed on 08/24/2021 

## 2021-09-04 ENCOUNTER — Telehealth (HOSPITAL_COMMUNITY): Payer: Self-pay

## 2021-09-04 NOTE — Telephone Encounter (Signed)
Called and was unable to leave patient a voice message  to confirm/remind patient of their appointment at the [facility] on 09/05/21.

## 2021-09-04 NOTE — Progress Notes (Incomplete)
[facility] Note  Date:  09/04/2021   ID:  Robert Booth, DOB 1971/02/06, MRN [id number]  Location: Home  Provider location:  [facility] Type of Visit: Established patient  PCP:  Charlott Rakes, MD  Psych: Elbert Ewings, NP [facility] EP: Dr. Lovena Le HF Cardiologist: Dr. Haroldine Laws  Chief Complaint: CHF follow-up   History of Present Illness: Robert Booth is a 51 y.o.male with hx of systolic HF with NICM secondary to prolonged ETOH abuse, EF 15% and CKD stage 2.  EF improved to 35-40% in 7/16 then stopped taking meds and EF back down   In 8/16, he presented to [facility] with hemoptysis. Found to have RLL PNA and cardiogenic shock. Treated briefly with milrinone and levophed. Diuresed well. Discharge weight 177 .    Hospitalized 9/28 with recurrent A/C systolic heart failure in the setting of medication noncompliance. He had been out of medications for 4 months. Echo EF 10-15%. Diuresed with IV lasix. HF meds restarted. D/c weight 177 lbs   Echo 1/19 EF ~20%. RV moderately dilated. Pt underwent ICD placement 07/03/17 - [employer].   Admitted 4/19 with ICD shock. Had VT with ATP x 2 and shock x 1. Electrolytes stable. No clear inciting cause.   Echo 12/2018 EF: 15% RV moderately HK No LV thrombus.   CPX 11/20 showed mild limitations due by body habitus and deconditioning. No clear HF limitation.  Incarcerated for 20 months and released 12/2020.  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 Per chart review, Echo (2/21) at [facility] showed EF <20%, severe global HK, grade 3 DD, mild/moderate MR, mild PR. He followed with Dr. Winfred Leeds for ICD checks as prison did not offer remote transmissions.  Echo 03/01/21 EF 20-25% RV moderately reduced  Sleep study 02/23: Moderate OSA  Last seen for f/u 07/05/21. Multiple runs of sustained VT on device check. Mexiletine increased to 300 mg BID. Felt okay. Did not  appear significantly overloaded on exam or by ReDS, however BNP significantly increased (42, >3,000 in 2 months). Scr above baseline at 2.3. Furosemide increased to 40 mg BID.   Here today for close follow-up. Feels horrible. About 2 weeks ago began noticing worsening shortness of breath and cough. He thought symptoms would improve on their own and has been napping a lot. Yesterday began having intermittent hemoptysis. Now having dyspnea at rest and is visibly uncomfortable. Notes UOP did not improve much after increasing his diuretics at last visit. He barely has energy to stand. Right foot is very tender to touch and is swollen.  ICD interrogation: Heart Logic Index 76 (started trending up first week of April), thoracic impedance down, activity level 1.2 hrs a day, one 13 second run NSVT since last device check   CPX done 11/20  Peak VO2: 22.2 (69% predicted peak VO2) - corrects 25.6 for ibw VE/VCO2 slope:  26  OUES: 2.57  Peak RER: 1.07   Previous studies: Echo 7/16 EF 20% Echo 12/16 EF 35 - 40% No LV clot Echo 8/17 EF 35% Echo 9/18 EF 10-15%. Echo 1/19 EF 15-20% Echo 10/20 EF 15% RV moderately HK No LV thrombus.  Echo (2/21) at [facility], EF <20%, severe global HK, grade 3 DD, mild/moderate MR, mild PR.   CMRI 05/20/2017  1. Mild to moderately dilated LV with EF 19%. Diffuse hypokinesis with septal-lateral dyssynchrony. There is a small thinned area of the basal inferoseptal wall, there does not appear to be an  actual VSD. No LV thrombus noted.  2.  Normal RV size with mildly decreased systolic function.  3. Noncoronary LGE pattern as noted above. This could be suggestive of prior myocarditis, less likely cardiac sarcoidosis.  Past Medical History:  Diagnosis Date   AICD (automatic cardioverter/defibrillator) present 07/03/2017   Anxiety    CHF (congestive heart failure) (HCC)    Essential hypertension    History of alcohol abuse    a. Sober since Feb 2016.   LV (left ventricular)  mural thrombus 07/2014   Documented n [place]   NICM (nonischemic cardiomyopathy) (HCC)    a. 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   Past Surgical History:  Procedure Laterality Date   CARDIAC CATHETERIZATION  05/2014   In [place], clean cors   HERNIA REPAIR Right Inguinal   ICD IMPLANT N/A 07/03/2017   Procedure: ICD IMPLANT;  Surgeon: Marinus Maw, MD;  Location: [facility] INVASIVE CV LAB;  Service: Cardiovascular;  Laterality: N/A;   KNEE SURGERY Right 1998   RIGHT HEART CATH AND CORONARY ANGIOGRAPHY N/A 07/30/2021   Procedure: RIGHT HEART CATH AND CORONARY ANGIOGRAPHY;  Surgeon: Dolores Patty, MD;  Location: [facility] INVASIVE CV LAB;  Service: Cardiovascular;  Laterality: N/A;   Current Outpatient Medications  Medication Sig Dispense Refill   apixaban (ELIQUIS) 5 MG TABS tablet Take 1 tablet (5 mg total) by mouth 2 (two) times daily. 60 tablet 6   dapagliflozin propanediol (FARXIGA) 10 MG TABS tablet Take 1 tablet (10 mg total) by mouth daily before breakfast. 90 tablet 3   furosemide (LASIX) 80 MG tablet Take 1 tablet (80 mg total) by mouth daily. 30 tablet 1   gabapentin (NEURONTIN) 300 MG capsule Take 2 capsules (600 mg total) by mouth 2 (two) times daily. 120 capsule 3   mexiletine (MEXITIL) 150 MG capsule Take 2 capsules (300 mg total) by mouth 2 (two) times daily. 120 capsule 2   pantoprazole (PROTONIX) 40 MG tablet Take 1 tablet (40 mg total) by mouth daily. (Patient taking differently: Take 40 mg by mouth daily. Takes as needed) 30 tablet 11   QUEtiapine (SEROQUEL) 100 MG tablet Take 100 mg by mouth at bedtime.     sertraline (ZOLOFT) 100 MG tablet Take 200 mg by mouth daily.     No current facility-administered medications for this visit.   Allergies:   Celexa [citalopram hydrobromide], Effexor  [venlafaxine], and Lisinopril   Social History:  The patient  reports that he has quit smoking. His smoking use included cigarettes. He has quit using smokeless tobacco.  His smokeless tobacco use included chew. He reports that he does not drink alcohol and does not use drugs.   Family History:  The patient's family history includes Heart attack (age of onset: 73) in his paternal grandfather; Heart attack (age of onset: 71) in his father; Hypertension in his father, mother, and sister.   ROS:  Please see the history of present illness.   All other systems are personally reviewed and negative.   Recent Labs: 07/05/2021: TSH 2.322 07/26/2021: B Natriuretic Peptide 1,528.0 07/30/2021: ALT 104 08/16/2021: BUN 50; Creatinine, Ser 2.39; Hemoglobin 11.9; Magnesium 2.3; Platelets 367; Potassium 4.3; Sodium 134  Personally reviewed   Wt Readings from Last 3 Encounters:  08/16/21 94.2 kg (207 lb 10.8 oz)  07/26/21 102.2 kg (225 lb 3.2  oz)  07/05/21 104.1 kg (229 lb 9.6 oz)    There were no vitals taken for this visit.  Physical Exam: General:  Appears unwell. In mild to moderate distress HEENT: normal Neck: supple. JVP to ear. Carotids 2+ bilat; no bruits. No lymphadenopathy or thryomegaly appreciated. Cor: PMI nondisplaced. Regular rate & rhythm, tachy. No rubs, gallops or murmurs. Lungs: bibasilar crackles, produces blood tinged sputum in clinic Abdomen: + distended. No hepatosplenomegaly.  Extremities: no cyanosis, clubbing, rash, trace edema, s/p bilateral TMA Neuro: alert & orientedx3, cranial nerves grossly intact. moves all 4 extremities w/o difficulty. Affect pleasant    Assessment/Plan:  1. Acute on chronic systolic HF >>suspected low-output HF - NICM - Cath ok performed [place] 2016. 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  - Echo 10/2016 EF 10-15%. LV dysfunction after stopping HF meds. - Echo 04/16/2017 EF 15-20% - cMRI 2/19 EF 19%. RV mildly decreased .  - s/p [employer] Sci ICD 07/03/17. Had  ICD shock with VT 07/08/17 - Echo 10/20 EF: 15% with moderate RV dysfunction - CPX 11/20 with mild functional limitation; no clear HF limitation despite EF<20% - Echo 03/01/21 EF 20-25% RV moderately reduced - ICD interrogation today: HL Index 76, up significantly over last 2 weeks, one 13 second NSVT, activity level 1 hr/day - Acutely worse the last 2 weeks, NYHA IIIb/IV. Significantly volume overloaded. Poor response to increase in diuretics after last visit. Suspect low-output HF. Discussed with Dr. Haroldine Laws. Will admit to [facility] for close monitoring. Will need PICC or central line and anticipate need for inotrope support. - Hold GDMT with soft BP and probable AKI - Check Echo - Will likely need RHC - May need to consider advanced therapies. Not sure he is a candidate for VAD. Has been more adherent with medical therapy recently   2. LV mural thrombus  - He has been off warfarin and is now on Eliquis. - No thrombus on echo 12/22. Will continue Eliquis given severe LV dysfunction - No bleeding issues.   3. H/o ETOH abuse - Previously drinking 1/5 liquor daily.  - Quit ETOH ~ 2 years ago. - Reports last drink was in 2021   4. VT 07/08/17  - He is s/p [place] Sci ICD 07/03/17. - Multiple episodes sustained VT on device check 06/621. Mexiletine increased to 300 mg BID. Just one episode NSVT since last check.  - Continue mexiletine at current dose. - He has established EP care w/ Dr. Lovena Le.   5. Gout - On allopurinol 300 mg daily.  - ? If acute gout flare contributing to right foot pain  6. 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 - Continues to struggle with balance/mobility.   7. Anxiety/Depression - Follows with [facility].  8. OSA - Struggling with adjusting to CPAP  9. Hemoptysis - Likely d/t #1  10. AKI on CKD:  - Baseline Scr 1.3-1.5, up to 2.3 on 04/06. His diuretics were increased at that time d/t volume overload. Unfortunately missed lab  appointment 04/13 and 04/20. - Anticipate worsening renal function on today's labs - Suspect cardiorenal syndrome  Admit to [facility]H for IV diuresis and initiation of inotrope support.  Check ECG, CBC, BNP, CMET, lactic acid STAT  Follow-up: Pending hospital course  Signed, Rafael Bihari, FNP  09/04/2021 4:51 PM  [facility] [address] Heart and [place] [address] [phone] (office) [phone] (fax)

## 2021-09-05 ENCOUNTER — Encounter (HOSPITAL_COMMUNITY): Payer: Medicaid Other

## 2021-09-06 ENCOUNTER — Emergency Department (HOSPITAL_COMMUNITY)
Admission: EM | Admit: 2021-09-06 | Discharge: 2021-09-06 | Disposition: A | Discharge status: Home / Self Care | Payer: Medicaid Other | Attending: Emergency Medicine | Admitting: Emergency Medicine

## 2021-09-06 ENCOUNTER — Encounter (HOSPITAL_COMMUNITY): Payer: Self-pay | Admitting: Oncology

## 2021-09-06 ENCOUNTER — Other Ambulatory Visit: Payer: Self-pay

## 2021-09-06 ENCOUNTER — Emergency Department: Payer: Self-pay

## 2021-09-06 DIAGNOSIS — T829XXA Unspecified complication of cardiac and vascular prosthetic device, implant and graft, initial encounter: Secondary | ICD-10-CM

## 2021-09-06 DIAGNOSIS — Z66 Do not resuscitate: Secondary | ICD-10-CM | POA: Insufficient documentation

## 2021-09-06 DIAGNOSIS — Z79899 Other long term (current) drug therapy: Secondary | ICD-10-CM | POA: Insufficient documentation

## 2021-09-06 DIAGNOSIS — Y712 Prosthetic and other implants, materials and accessory cardiovascular devices associated with adverse incidents: Secondary | ICD-10-CM | POA: Insufficient documentation

## 2021-09-06 DIAGNOSIS — I5084 End stage heart failure: Secondary | ICD-10-CM | POA: Diagnosis not present

## 2021-09-06 DIAGNOSIS — I5082 Biventricular heart failure: Secondary | ICD-10-CM | POA: Insufficient documentation

## 2021-09-06 DIAGNOSIS — Z7901 Long term (current) use of anticoagulants: Secondary | ICD-10-CM | POA: Insufficient documentation

## 2021-09-06 DIAGNOSIS — T82594A Other mechanical complication of infusion catheter, initial encounter: Secondary | ICD-10-CM | POA: Insufficient documentation

## 2021-09-06 MED ORDER — SODIUM CHLORIDE 0.9% FLUSH: 10.0000 mL | INTRAVENOUS | Status: DC | PRN

## 2021-09-06 MED ORDER — CHLORHEXIDINE GLUCONATE CLOTH 2 % EX PADS: 6.0000 | MEDICATED_PAD | Freq: Every day | CUTANEOUS | Status: DC

## 2021-09-06 MED ORDER — FUROSEMIDE 10 MG/ML IJ SOLN
80.0000 mg | Freq: Once | INTRAMUSCULAR | Status: AC
  Administered 2021-09-06: 80 mg via INTRAVENOUS
  Filled 2021-09-06: qty 8

## 2021-09-06 MED ORDER — SODIUM CHLORIDE 0.9% FLUSH: 10.0000 mL | Freq: Two times a day (BID) | INTRAVENOUS | Status: DC

## 2021-09-06 NOTE — ED Triage Notes (Signed)
Pt bib [facility] from home d/t PICC line being dislodged by 5 cm. Pt is currently on hospice. Per EMS pt has no c/o.

## 2021-09-06 NOTE — ED Notes (Addendum)
Transport arranged w/ [facility] d/t hospice agreement.  Pt vitally able and in NAD at this time. Family present at bedside and aware.

## 2021-09-06 NOTE — Progress Notes (Signed)
IV team consult for PICC evaluation. Upon assessment, PICC has been pulled out to 5 cm. PICC was placed at 1 cm. When PICC becomes displaced past 3 cm, an exchange is needed. RN and provider notified. Educated family in room.

## 2021-09-06 NOTE — ED Notes (Signed)
Attempted to get oral temp and was unable. Pt refused rectal temp

## 2021-09-06 NOTE — Discharge Instructions (Signed)
Your PICC line has been exchanged. Please follow-up with your doctor for management of your chronic medical problems and return with any new concerns.

## 2021-09-06 NOTE — Progress Notes (Signed)
Peripherally Inserted Central Catheter Placement  The IV Nurse has discussed with the patient and/or persons authorized to consent for the patient, the purpose of this procedure and the potential benefits and risks involved with this procedure.  The benefits include less needle sticks, lab draws from the catheter, and the patient may be discharged home with the catheter. Risks include, but not limited to, infection, bleeding, blood clot (thrombus formation), and puncture of an artery; nerve damage and irregular heartbeat and possibility to perform a PICC exchange if needed/ordered by physician.  Alternatives to this procedure were also discussed.  Bard Power PICC patient education guide, fact sheet on infection prevention and patient information card has been provided to patient /or left at bedside.    PICC Placement Documentation  PICC Single Lumen 09/06/21 Right Brachial 40 cm 0 cm (Active)  Indication for Insertion or Continuance of Line Home intravenous therapies (PICC only) 09/06/21 1807  Exposed Catheter (cm) 0 cm 09/06/21 1807  Site Assessment Clean, Dry, Intact 09/06/21 1807  Line Status Flushed;Blood return noted;Saline locked 09/06/21 1807  Dressing Type Transparent 09/06/21 1807  Dressing Status Antimicrobial disc in place 09/06/21 1807  Dressing Change Due 09/13/21 09/06/21 1807       Scotty Court 09/06/2021, 6:12 PM

## 2021-09-06 NOTE — ED Provider Notes (Signed)
[facility] DEPT Provider Note   [name]: [id number] Arrival date & time: 09/06/21  1150     History  Chief Complaint  Patient presents with   Vascular Access Problem    Robert Booth is a 51 y.o. male.  Patient with history of end-stage biventricular heart failure, DNR --presents to the emergency department today for complications with PICC line in right arm.  Patient typically receives furosemide through his PICC line.  He states that yesterday this was accidentally pulled on during a dressing change.  It is currently several centimeters pulled from its standard positioning.  Patient is otherwise at his baseline.  He has not had furosemide today.  Denies other complaints.      Home Medications Prior to Admission medications   Medication Sig Start Date End Date Taking? Authorizing Provider  apixaban (ELIQUIS) 5 MG TABS tablet Take 1 tablet (5 mg total) by mouth 2 (two) times daily. 02/27/21   [place], Maricela Bo, FNP  dapagliflozin propanediol (FARXIGA) 10 MG TABS tablet Take 1 tablet (10 mg total) by mouth daily before breakfast. 04/09/21   Bensimhon, Shaune Pascal, MD  furosemide (LASIX) 80 MG tablet Take 1 tablet (80 mg total) by mouth daily. 08/16/21 11/14/21  Bhagat, Crista Luria, PA  gabapentin (NEURONTIN) 300 MG capsule Take 2 capsules (600 mg total) by mouth 2 (two) times daily. 05/16/21   Charlott Rakes, MD  mexiletine (MEXITIL) 150 MG capsule Take 2 capsules (300 mg total) by mouth 2 (two) times daily. 07/05/21   Joette Catching, PA-C  pantoprazole (PROTONIX) 40 MG tablet Take 1 tablet (40 mg total) by mouth daily. Patient taking differently: Take 40 mg by mouth daily. Takes as needed 05/14/21   Bensimhon, Shaune Pascal, MD  QUEtiapine (SEROQUEL) 100 MG tablet Take 100 mg by mouth at bedtime.    [provider]  sertraline (ZOLOFT) 100 MG tablet Take 200 mg by mouth daily.    [provider]      Allergies    Celexa [citalopram  hydrobromide], Effexor [venlafaxine], and Lisinopril    Review of Systems   Review of Systems  Physical Exam Updated Vital Signs BP (!) 138/97 (BP Location: Left Arm)   Pulse (!) 114   Temp (!) 97.5 F (36.4 C) (Axillary)   Resp (!) 21   SpO2 97%  Physical Exam Vitals and nursing note reviewed.  Constitutional:      General: He is not in acute distress.    Appearance: He is well-developed.  HENT:     Head: Normocephalic and atraumatic.  Eyes:     General:        Right eye: No discharge.        Left eye: No discharge.     Conjunctiva/sclera: Conjunctivae normal.  Cardiovascular:     Rate and Rhythm: Regular rhythm. Tachycardia present.     Heart sounds: Normal heart sounds.  Pulmonary:     Effort: Pulmonary effort is normal.     Breath sounds: Normal breath sounds.  Abdominal:     Palpations: Abdomen is soft.     Tenderness: There is no abdominal tenderness.  Musculoskeletal:     Cervical back: Normal range of motion and neck supple.     Right lower leg: Edema present.     Left lower leg: Edema present.     Comments: Mild bilateral lower extremity edema  PICC in right arm secured with Tegaderm.  It appears to have been pulled several centimeters out.  Skin:  General: Skin is warm and dry.  Neurological:     Mental Status: He is alert.    ED Results / Procedures / Treatments   Labs (all labs ordered are listed, but only abnormal results are displayed) Labs Reviewed - No data to display  EKG None  Radiology No results found.  Procedures Procedures    Medications Ordered in ED Medications - No data to display  ED Course/ Medical Decision Making/ A&P    Patient seen and examined. History obtained directly from patient.  I also reviewed previous hospitalization and cardiology notes in epic.  Labs/EKG: None ordered  Imaging: None ordered.  Medications/Fluids: None ordered.  Most recent vital signs reviewed and are as follows: BP (!) 138/97 (BP  Location: Left Arm)   Pulse (!) 114   Temp (!) 97.5 F (36.4 C) (Axillary)   Resp (!) 21   SpO2 97%   Initial impression: Malplaced PICC line, IV team consult ordered.  1:03 PM Ordered PICC exchange per IV team reccs.   3:08 PM update Per RN, PICC team currently at [facility].  Plan to see patient soon.  BP 110/87   Pulse (!) 108   Temp (!) 97.5 F (36.4 C) (Axillary)   Resp 16   SpO2 97%   Signout to [employer] at shift change. Pending completion of PICC exchange.   Plan: D/c to home after PICC exchange.                           Medical Decision Making  Patient here with complication of peripherally inserted central catheter, pending exchange.  Otherwise no acute complaints in regards to his multiple other chronic comorbidities.  Should be able to go home after completion of PICC exchange.        Final Clinical Impression(s) / ED Diagnoses Final diagnoses:  Complication associated with peripherally inserted central catheter (PICC), initial encounter    Rx / DC Orders ED Discharge Orders     None         Carlisle Cater, PA-C 09/06/21 [place], DO 09/06/21 1515

## 2021-09-12 ENCOUNTER — Telehealth (HOSPITAL_COMMUNITY): Payer: Self-pay | Admitting: *Deleted

## 2021-09-12 NOTE — Telephone Encounter (Signed)
Patients hospice RN called to update Dr.Bensimhon. Pt stopped all meds including eliquis last Tuesday except tramadol, morphine, and IV lasix. RN was not aware until yesterday. Pts right arm is swollen from the elbow down. The hospice physician thinks he has a clot since stopping eliquis. Pt has not had a bowel movement since last Wednesday stomach is distended but he refuses stool softener or laxative.  Pts eyes are jaundice and he is not eating. Pt is very lethargic and RN thinks he is transitioning. Today pt did not open his eyes during her visit. POA will decided today if they will continue IV lasix.   Routed to Dr.Bensimhon

## 2021-09-29 DEATH — deceased

## 2021-10-10 ENCOUNTER — Ambulatory Visit: Payer: Medicaid Other | Admitting: Physician Assistant

## 2021-10-10 ENCOUNTER — Encounter (HOSPITAL_COMMUNITY): Payer: Self-pay

## 2022-05-17 IMAGING — CT CT CHEST-ABD-PELV W/O CM
2 of 4 series · 14 of 36 positions shown, 16 images · non-contrast
Comparison: CT October 12, 2014

CLINICAL DATA: Pre assessment for LVAD evaluation.



[Series 3: cap wo 5.0 i31f 2 · axial · 0.88mm/px · z∈[+864,+1404]mm · 11 of 130 slices shown, 13 images]
[im 11/130  mediastinal]
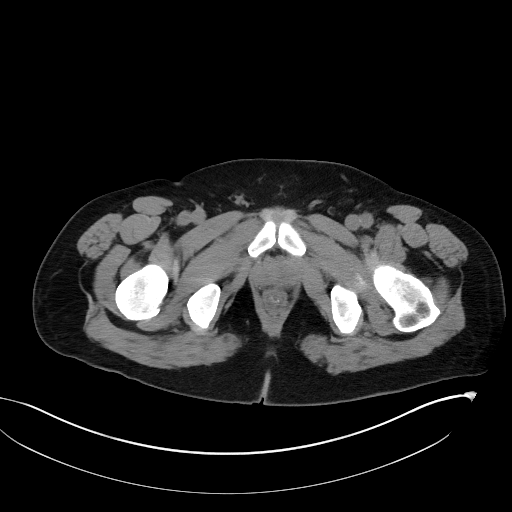
[im 11/130  bone]
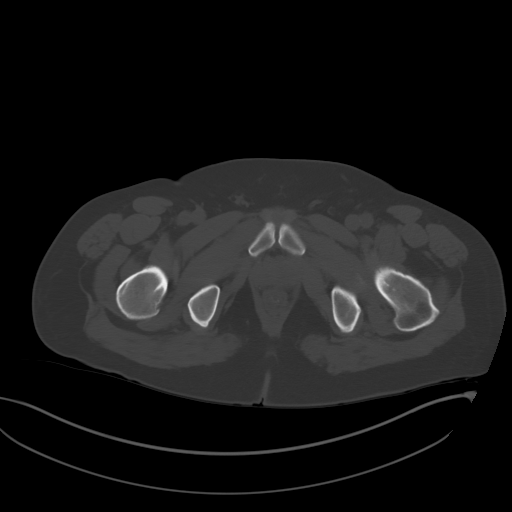
[im 22/130  mediastinal]
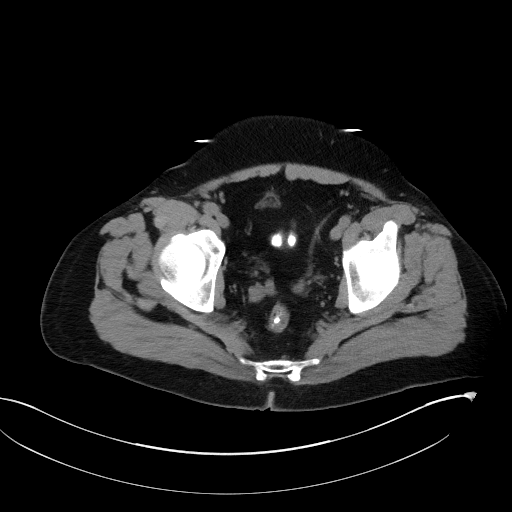
[im 33/130  mediastinal]
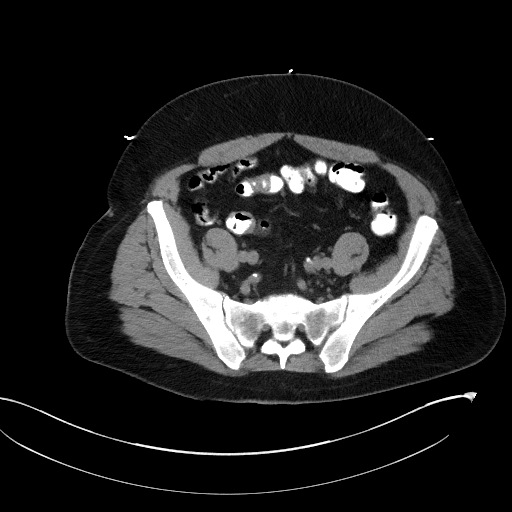
[im 44/130  mediastinal]
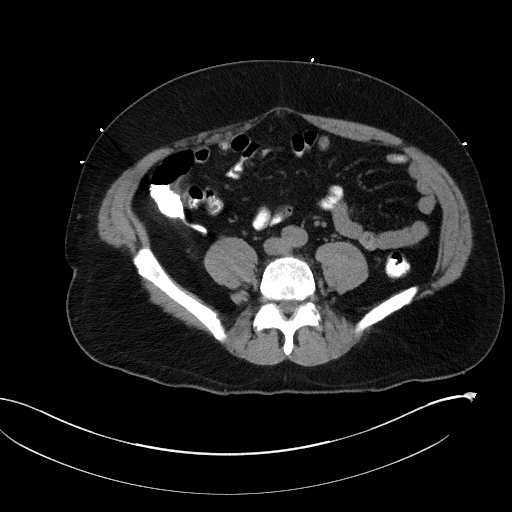
[im 54/130  mediastinal]
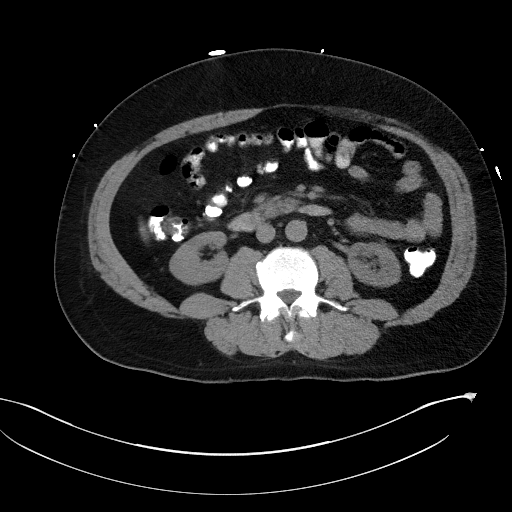
[im 65/130  mediastinal]
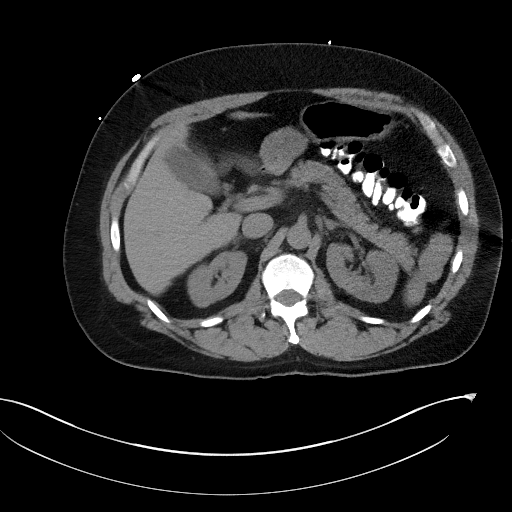
[im 76/130  mediastinal]
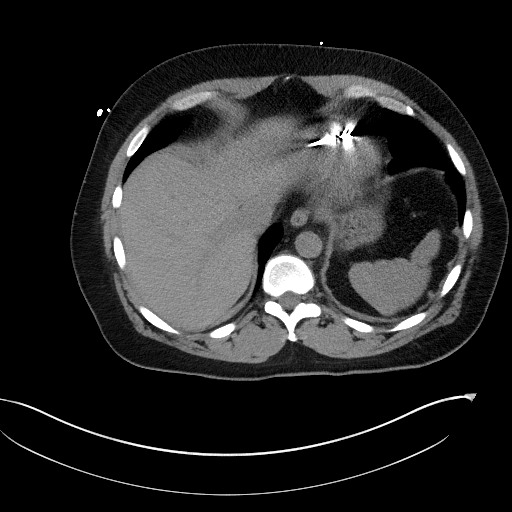
[im 87/130  mediastinal]
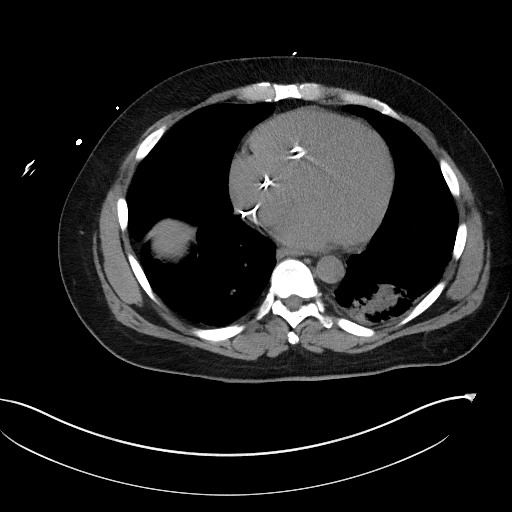
[im 97/130  mediastinal]
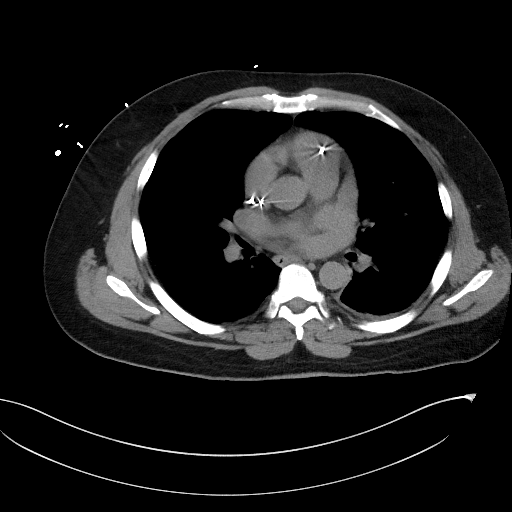
[im 97/130  bone]
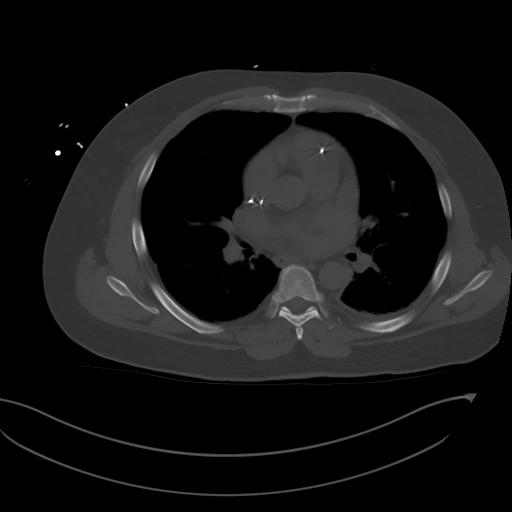
[im 108/130  mediastinal]
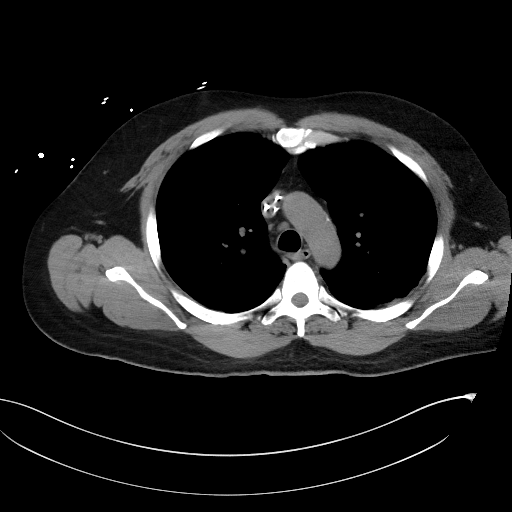
[im 119/130  mediastinal]
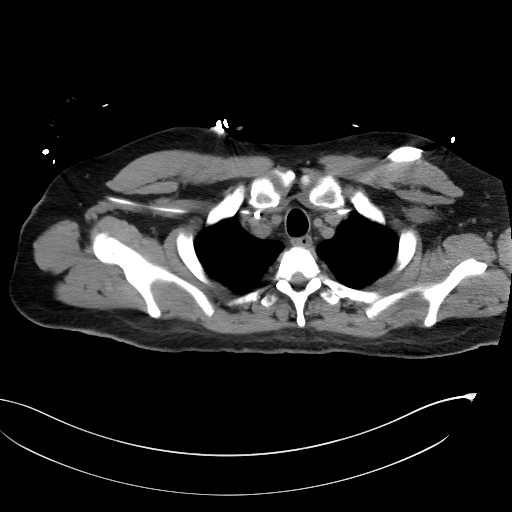

[Series 6: coronal · coronal · 0.94mm/px · 3 of 150 slices shown]
[im 30/150  mediastinal]
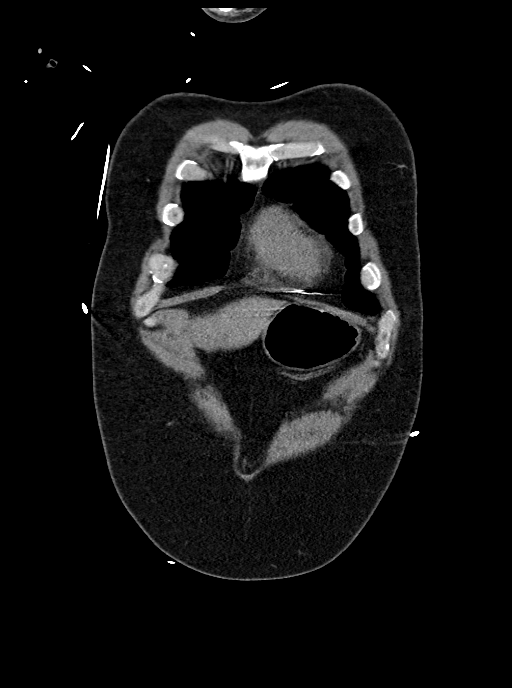
[im 60/150  mediastinal]
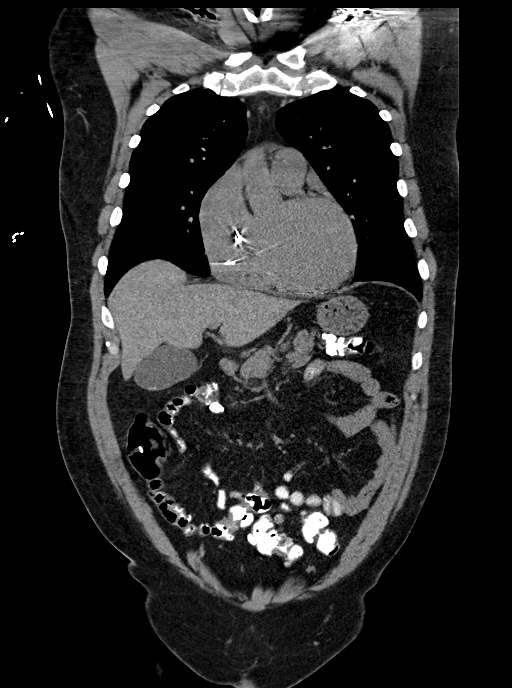
[im 90/150  mediastinal]
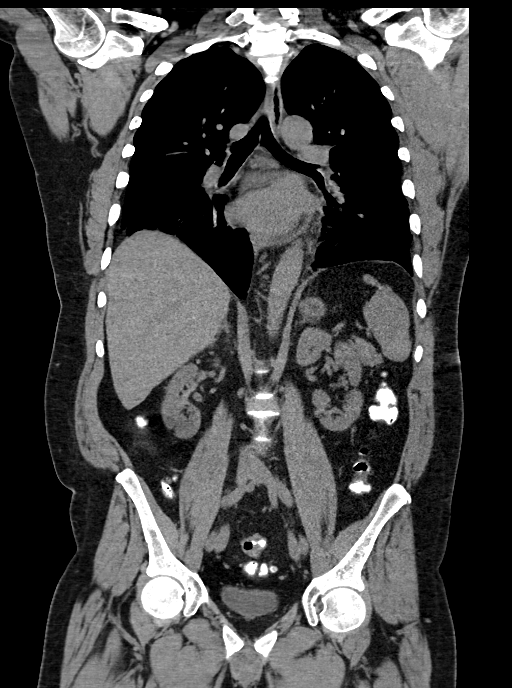

[14 of 36 positions shown; findings below may reference images not displayed]

FINDINGS: CT CHEST FINDINGS

Cardiovascular: Right IJ Swan-Ganz catheter with tip in the
pulmonary outflow tract. Right upper extremity PICC with tip near
the superior cavoatrial junction. Left chest AICD with tip in the
right ventricle.

Minimal aortic atherosclerosis without thoracic aortic aneurysm.
Dilated main pulmonary artery measuring 3.6 cm. Mild cardiac
enlargement. Small anterior pericardial effusion.

Mediastinum/Nodes: No discrete thyroid nodule. Prominent mediastinal
lymph nodes for instance a 7 mm right paratracheal lymph node on
image [DATE]. No pathologically enlarged mediastinal, hilar or
axillary lymph nodes, noting limited sensitivity for the detection
of hilar adenopathy on this noncontrast study. Esophagus is grossly
unremarkable.

Lungs/Pleura: Scattered areas of atelectasis versus scarring.
Consolidation in the left lower lobe with central area ground-glass
measures 6.1 cm on image 118/4. Trace left pleural effusion.

Musculoskeletal: No aggressive lytic or blastic lesion of bone.
Degenerative change of the bilateral shoulders.

CT ABDOMEN PELVIS FINDINGS

Hepatobiliary: No suspicious hepatic lesion on this noncontrast
examination. Tiny cholelithiasis without findings of acute
cholecystitis. No biliary ductal dilation.

Pancreas: No pancreatic ductal dilation evidence of acute
inflammation.

Spleen: No splenomegaly or focal splenic lesion.

Adrenals/Urinary Tract: Bilateral adrenal glands appear normal. No
hydronephrosis. Multifocal cortical renal scarring. No
nephrolithiasis. Urinary bladder is minimally distended limiting
evaluation.

Stomach/Bowel: Radiopaque enteric contrast material traverses the
rectum. Stomach is unremarkable for degree of distension. No
pathologic dilation of small or large bowel. The appendix and
terminal ileum appear normal. Colonic diverticulosis without
findings of acute diverticulitis.

Vascular/Lymphatic: Minimal aortic and branch vessel atherosclerosis
without abdominal aortic aneurysm. No pathologically enlarged
abdominal or pelvic lymph nodes.

Reproductive: Prostate is unremarkable.

Other: No significant abdominopelvic free fluid.

Musculoskeletal: No acute osseous abnormality.
IMPRESSION: 1. Mild cardiac enlargement with small anterior pericardial
effusion.
2. Dilated main pulmonary artery, which can be seen in the setting
of pulmonary arterial hypertension.
3. Consolidation in the left lower lobe with central area of
ground-glass measures 6.1 cm common nonspecific but most commonly
associated with organizing pneumonia having primary alternate
differential considerations of pulmonary infarction or an infectious
process including bacterial and fungal etiologies.
4. Trace left pleural effusion.
5. Cholelithiasis without findings of acute cholecystitis.
6. Colonic diverticulosis without findings of acute diverticulitis.
7.  Aortic Atherosclerosis ([id number]).
